# Patient Record
Sex: Female | Born: 1935 | Race: White | Hispanic: No | Smoking: Former smoker
Health system: Southern US, Community
[De-identification: ages and names within clinical notes are randomized; demographics above are authoritative.]

## PROBLEM LIST (undated history)

## (undated) DIAGNOSIS — E538 Deficiency of other specified B group vitamins: Secondary | ICD-10-CM

## (undated) DIAGNOSIS — R21 Rash and other nonspecific skin eruption: Secondary | ICD-10-CM

## (undated) DIAGNOSIS — K851 Biliary acute pancreatitis without necrosis or infection: Secondary | ICD-10-CM

## (undated) DIAGNOSIS — I340 Nonrheumatic mitral (valve) insufficiency: Secondary | ICD-10-CM

## (undated) DIAGNOSIS — D649 Anemia, unspecified: Secondary | ICD-10-CM

## (undated) DIAGNOSIS — IMO0001 Reserved for inherently not codable concepts without codable children: Secondary | ICD-10-CM

## (undated) DIAGNOSIS — N289 Disorder of kidney and ureter, unspecified: Secondary | ICD-10-CM

## (undated) DIAGNOSIS — E669 Obesity, unspecified: Secondary | ICD-10-CM

## (undated) DIAGNOSIS — M1990 Unspecified osteoarthritis, unspecified site: Secondary | ICD-10-CM

## (undated) DIAGNOSIS — Z86010 Personal history of colonic polyps: Secondary | ICD-10-CM

## (undated) DIAGNOSIS — M5104 Intervertebral disc disorders with myelopathy, thoracic region: Secondary | ICD-10-CM

## (undated) DIAGNOSIS — I5032 Chronic diastolic (congestive) heart failure: Secondary | ICD-10-CM

## (undated) DIAGNOSIS — I10 Essential (primary) hypertension: Secondary | ICD-10-CM

## (undated) DIAGNOSIS — J189 Pneumonia, unspecified organism: Secondary | ICD-10-CM

## (undated) DIAGNOSIS — M8580 Other specified disorders of bone density and structure, unspecified site: Secondary | ICD-10-CM

## (undated) DIAGNOSIS — E119 Type 2 diabetes mellitus without complications: Secondary | ICD-10-CM

## (undated) DIAGNOSIS — G629 Polyneuropathy, unspecified: Secondary | ICD-10-CM

## (undated) DIAGNOSIS — E785 Hyperlipidemia, unspecified: Secondary | ICD-10-CM

## (undated) DIAGNOSIS — I350 Nonrheumatic aortic (valve) stenosis: Secondary | ICD-10-CM

## (undated) DIAGNOSIS — J449 Chronic obstructive pulmonary disease, unspecified: Secondary | ICD-10-CM

## (undated) DIAGNOSIS — N184 Chronic kidney disease, stage 4 (severe): Secondary | ICD-10-CM

## (undated) DIAGNOSIS — G709 Myoneural disorder, unspecified: Secondary | ICD-10-CM

## (undated) DIAGNOSIS — K219 Gastro-esophageal reflux disease without esophagitis: Secondary | ICD-10-CM

## (undated) DIAGNOSIS — G4730 Sleep apnea, unspecified: Secondary | ICD-10-CM

## (undated) DIAGNOSIS — F418 Other specified anxiety disorders: Secondary | ICD-10-CM

## (undated) HISTORY — DX: Other specified disorders of bone density and structure, unspecified site: M85.80

## (undated) HISTORY — DX: Essential (primary) hypertension: I10

## (undated) HISTORY — PX: CATARACT EXTRACTION, BILATERAL: SHX1313

## (undated) HISTORY — DX: Hyperlipidemia, unspecified: E78.5

## (undated) HISTORY — DX: Deficiency of other specified B group vitamins: E53.8

## (undated) HISTORY — DX: Myoneural disorder, unspecified: G70.9

## (undated) HISTORY — DX: Chronic kidney disease, stage 4 (severe): N18.4

## (undated) HISTORY — DX: Sleep apnea, unspecified: G47.30

## (undated) HISTORY — DX: Obesity, unspecified: E66.9

## (undated) HISTORY — DX: Chronic obstructive pulmonary disease, unspecified: J44.9

## (undated) HISTORY — DX: Unspecified osteoarthritis, unspecified site: M19.90

## (undated) HISTORY — PX: TUBAL LIGATION: SHX77

## (undated) HISTORY — DX: Anemia, unspecified: D64.9

## (undated) HISTORY — DX: Gastro-esophageal reflux disease without esophagitis: K21.9

---

## 1994-07-31 HISTORY — PX: BREAST SURGERY: SHX581

## 1999-02-08 ENCOUNTER — Other Ambulatory Visit: Admission: RE | Admit: 1999-02-08 | Discharge: 1999-02-08 | Payer: Self-pay | Admitting: Family Medicine

## 1999-11-22 ENCOUNTER — Encounter: Admission: RE | Admit: 1999-11-22 | Discharge: 1999-11-22 | Payer: Self-pay

## 2000-02-08 ENCOUNTER — Ambulatory Visit (HOSPITAL_COMMUNITY): Admission: RE | Admit: 2000-02-08 | Discharge: 2000-02-08 | Payer: Self-pay | Admitting: Neurosurgery

## 2000-02-08 ENCOUNTER — Encounter: Payer: Self-pay | Admitting: Neurosurgery

## 2000-02-23 ENCOUNTER — Encounter: Payer: Self-pay | Admitting: Neurosurgery

## 2000-02-23 ENCOUNTER — Ambulatory Visit (HOSPITAL_COMMUNITY): Admission: RE | Admit: 2000-02-23 | Discharge: 2000-02-23 | Payer: Self-pay | Admitting: Neurosurgery

## 2002-12-09 ENCOUNTER — Ambulatory Visit (HOSPITAL_COMMUNITY): Admission: RE | Admit: 2002-12-09 | Discharge: 2002-12-09 | Payer: Self-pay | Admitting: Neurosurgery

## 2002-12-09 ENCOUNTER — Encounter: Payer: Self-pay | Admitting: Neurosurgery

## 2006-05-09 ENCOUNTER — Ambulatory Visit: Payer: Self-pay | Admitting: Cardiology

## 2007-09-11 LAB — HM DEXA SCAN

## 2008-01-20 ENCOUNTER — Inpatient Hospital Stay (HOSPITAL_COMMUNITY): Admission: EM | Admit: 2008-01-20 | Discharge: 2008-01-23 | Payer: Self-pay | Admitting: Emergency Medicine

## 2008-04-28 ENCOUNTER — Encounter
Admission: RE | Admit: 2008-04-28 | Discharge: 2008-04-28 | Payer: Self-pay | Admitting: Physical Medicine and Rehabilitation

## 2009-06-07 LAB — CONVERTED CEMR LAB: Hemoglobin: 11.2 g/dL

## 2009-06-17 ENCOUNTER — Encounter: Payer: Self-pay | Admitting: Internal Medicine

## 2009-06-17 LAB — CONVERTED CEMR LAB
Creatinine, Ser: 1.1 mg/dL
Hemoglobin: 11 g/dL

## 2009-09-02 ENCOUNTER — Encounter: Payer: Self-pay | Admitting: Internal Medicine

## 2009-09-02 LAB — CONVERTED CEMR LAB
Creatinine, Ser: 1.17 mg/dL
Hemoglobin: 11.5 g/dL

## 2009-09-07 ENCOUNTER — Encounter (INDEPENDENT_AMBULATORY_CARE_PROVIDER_SITE_OTHER): Payer: Self-pay | Admitting: *Deleted

## 2009-10-08 ENCOUNTER — Encounter (INDEPENDENT_AMBULATORY_CARE_PROVIDER_SITE_OTHER): Payer: Self-pay | Admitting: *Deleted

## 2009-10-08 ENCOUNTER — Ambulatory Visit: Payer: Self-pay | Admitting: Internal Medicine

## 2009-10-08 DIAGNOSIS — E538 Deficiency of other specified B group vitamins: Secondary | ICD-10-CM | POA: Insufficient documentation

## 2009-10-11 LAB — CONVERTED CEMR LAB
Basophils Absolute: 0 10*3/uL (ref 0.0–0.1)
Ferritin: 27.6 ng/mL (ref 10.0–291.0)
Folate: >20 ng/mL
Hemoglobin: 10.9 g/dL — ABNORMAL LOW (ref 12.0–15.0)
Iron: 81 ug/dL (ref 42–145)
Lymphocytes Relative: 24.4 % (ref 12.0–46.0)
Monocytes Relative: 7.3 % (ref 3.0–12.0)
Neutro Abs: 4.2 10*3/uL (ref 1.4–7.7)
Neutrophils Relative %: 66.3 % (ref 43.0–77.0)
Platelets: 262 10*3/uL (ref 150.0–400.0)
RDW: 16.2 % — ABNORMAL HIGH (ref 11.5–14.6)
Vitamin B-12: 163 pg/mL — ABNORMAL LOW (ref 211–911)

## 2009-11-15 ENCOUNTER — Telehealth: Payer: Self-pay | Admitting: Internal Medicine

## 2010-05-17 LAB — HEMOGLOBIN A1C: Hgb A1c MFr Bld: 5.7 % (ref 4.0–6.0)

## 2010-05-17 LAB — HM DIABETES FOOT EXAM

## 2010-05-31 NOTE — Letter (Signed)
Summary: New Patient letter  [facility]  [address] [place], [place] [address]   Phone: [phone]  Fax: [phone]       09/07/2009 MRN: [id number]  Anna [name] [address] [place], [place]  [address]  Dear [name],  Welcome to the Gastroenterology Division at [facility].    You are scheduled to see Dr.  Leone Payor on 10-08-09 at 1:45p.m. on the 3rd floor at [facility], [address]  We ask that you try to arrive at our office 15 minutes prior to your appointment time to allow for check-in.  We would like you to complete the enclosed self-administered evaluation form prior to your visit and bring it with you on the day of your appointment.  We will review it with you.  Also, please bring a complete list of all your medications or, if you prefer, bring the medication bottles and we will list them.  Please bring your insurance card so that we may make a copy of it.  If your insurance requires a referral to see a specialist, please bring your referral form from your primary care physician.  Co-payments are due at the time of your visit and may be paid by cash, check or credit card.     Your office visit will consist of a consult with your physician (includes a physical exam), any laboratory testing he/she may order, scheduling of any necessary diagnostic testing (e.g. x-ray, ultrasound, CT-scan), and scheduling of a procedure (e.g. Endoscopy, Colonoscopy) if required.  Please allow enough time on your schedule to allow for any/all of these possibilities.    If you cannot keep your appointment, please call [phone] to cancel or reschedule prior to your appointment date.  This allows [place] the opportunity to schedule an appointment for another patient in need of care.  If you do not cancel or reschedule by 5 p.m. the business day prior to your appointment date, you will be charged a $50.00 late cancellation/no-show fee.    Thank you for  choosing [facility] for your medical needs.  We appreciate the opportunity to care for you.  Please visit [place] at our website  to learn more about our practice.                     Sincerely,                                                             The Gastroenterology Division

## 2010-05-31 NOTE — Assessment & Plan Note (Signed)
Summary: anemia--ch.   History of Present Illness Visit Type: new patient Primary GI MD: Stan Head MD [facility] Requesting Provider: Rudi Heap, MD Chief Complaint: anemia History of Present Illness:   76 yo woman followed by [facility]. She is persistently anemic despite 4 months of iron therapy. Hgb 11 x months. She did not do do stool hemoccults as recommended. Her stools are dark on iron. She also hads heartburn treated with PPI and some bloating. Has not seen any bleeding. Hgb 11 x 1 year Here with daughter and daughter provides some history.   GI Review of Systems    Reports acid reflux and  bloating.      Denies abdominal pain, belching, chest pain, dysphagia with liquids, dysphagia with solids, heartburn, loss of appetite, nausea, vomiting, vomiting blood, weight loss, and  weight gain.        Denies anal fissure, black tarry stools, change in bowel habit, constipation, diarrhea, diverticulosis, fecal incontinence, heme positive stool, hemorrhoids, irritable bowel syndrome, jaundice, light color stool, liver problems, rectal bleeding, and  rectal pain. Anticoagulation Management History:      Positive risk factors for bleeding include an age of 42 years or older.  The bleeding index is 'intermediate risk'.  Positive CHADS2 values include History of HTN.  Negative CHADS2 values include Age > 55 years old.     Preventive Screening-Counseling & Management  Alcohol-Tobacco     Smoking Status: quit  Caffeine-Diet-Exercise     Does Patient Exercise: no      Drug Use:  no.      Current Medications (verified): 1)  Tricor 145 Mg Tabs (Fenofibrate) .... Take 1 Tablet By Mouth Once A Day 2)  Actos 45 Mg Tabs (Pioglitazone Hcl) .... Take 1 Tablet By Mouth Once A Day 3)  Lipitor 80 Mg Tabs (Atorvastatin Calcium) .... Take 1 Tablet By Mouth Once A Day 4)  Aspirin 81 Mg Tbec (Aspirin) .... Take 1 Tablet By Mouth Once A Day 5)  Furosemide 40 Mg Tabs  (Furosemide) .... Take 1 Tablet By Mouth Once A Day 6)  Omeprazole 40 Mg Cpdr (Omeprazole) .... Take 1 Tablet By Mouth Once A Day 7)  Folivane-Plus  Caps (Fefum-Fepoly-Fa-B Cmp-C-Biot) .... Take 1 Tablet By Mouth Once Daily 8)  Gabapentin 300 Mg Caps (Gabapentin) .... Take 1 Tablet By Mouth Three Times A Day 9)  Oxycodone-Acetaminophen 10-325 Mg Tabs (Oxycodone-Acetaminophen) .... Take 1/2 Tablet By Mouth Four Times Daily 10)  Carvedilol 25 Mg Tabs (Carvedilol) .... Take 2 Tablets By Mouth Once Daily 11)  Metformin Hcl 1000 Mg Tabs (Metformin Hcl) .... Take 1 Tablet By Mouth Two Times A Day 12)  Clonidine Hcl 0.2 Mg Tabs (Clonidine Hcl) .... Take 1 Tablet By Mouth Two Times A Day 13)  Benazepril Hcl 40 Mg Tabs (Benazepril Hcl) .... Take 2 Tablets By Mouth Once Daily 14)  Fish Oil 1000 Mg Caps (Omega-3 Fatty Acids) .... Take 2 Tablets By Mouth Once Daily 15)  Vitamin D (Ergocalciferol) 50000 Unit Caps (Ergocalciferol) .... Take 1 Capsule By Mouth Once Per Week 16)  Combivent 18-103 Mcg/act Aero (Ipratropium-Albuterol) .... Take 1 Inhalation Two Times A Day As Needed 17)  Humalog Mix 75/25 75-25 % Susp (Insulin Lispro Prot & Lispro) .... Take 50 Units Once Daily  Allergies (verified): No Known Drug Allergies  Past History:  Past Medical History: Anxiety Disorder Arthritis COPD Depression Diabetes GERD Hyperlipidemia Hypertension Obesity Sleep Apnea  Past Surgical History: Tubal Ligation  Family History:  Family History of Colon Cancer: Sister??? Family History of Diabetes: Sister x 2, Father  Social History: Patient is a former smoker. -stopped 2 years ago Alcohol Use - no Illicit Drug Use - no Patient does not get regular exercise.  Smoking Status:  quit Drug Use:  no Does Patient Exercise:  no  Review of Systems       The patient complains of anxiety-new, arthritis/joint pain, back pain, muscle pains/cramps, sleeping problems, swelling of feet/legs, urination -  excessive, and urine leakage.         right toes painful after trauma this week All other ROS negative except as per HPI.   Vital Signs:  Patient profile:   76 year old female Height:      64 inches Weight:      262.25 pounds BMI:     45.18 BSA:     2.20 Pulse rate:   76 / minute Pulse rhythm:   regular BP sitting:   150 / 58  (left arm) Cuff size:   large  Vitals Entered By: Lamona Curl CMA Duncan Dull) (October 08, 2009 1:28 PM)  Physical Exam  General:  obese, NAD Neck:  Supple; Chest Wall:  kyphosis.   Lungs:  Clear throughout to auscultation. Heart:  Regular rate and rhythm; no murmurs, rubs,  or bruits. Abdomen:  obese, soft and nontender BS+ no HSM/mass Rectal:  anostenosis.   Extremities:  ecchymosis right 5th toe with some tenderness and swollen and tender right 3rd toe Skin:  intertriginous yeast, erythema Psych:  Alert and cooperative. Normal mood and affect.   Impression & Recommendations:  Problem # 1:  ANEMIA, MILD (ICD-285.9)  chronic and in setting of 4 mos iron without improvement, renal insufficiency and other illnesses   Orders: TLB-CBC Platelet - w/Differential (85025-CBCD) TLB-B12 + Folate Pnl ([address]_60454-U98/JXB) TLB-Ferritin (82728-FER) TLB-IBC Pnl (Iron/FE;Transferrin) (83550-IBC)  Problem # 2:  SCREENING, COLON CANCER (ICD-V76.51) Assessment: New  Risks, benefits,and indications of endoscopic procedure(s) were reviewed with the patient and all questions answered.  Orders: Colonoscopy (Colon)  Problem # 3:  VITAMIN B12 DEFICIENCY (ICD-266.2) Assessment: New Found on labs after visit could be contributing to anemia will contact patient and arrange for treatment, likely via PCP  Anticoagulation Management Assessment/Plan:            Patient Instructions: 1)  Please go to the basement to have your lab tests drawn today.  2)  Please pick up your medications at your pharmacy.  3)  We will see you at your procedure on  11/23/09. 4)   [facility] Patient Information Guide given to patient.  5)  Colonoscopy and Flexible Sigmoidoscopy brochure given.  6)  Copy sent to : Paulene Floor, FNP 7)  The medication list was reviewed and reconciled.  All changed / newly prescribed medications were explained.  A complete medication list was provided to the patient / caregiver. Prescriptions: MOVIPREP 100 GM  SOLR (PEG-KCL-NACL-NASULF-NA ASC-C) As per prep instructions.  #1 x 0   Entered by:   Francee Piccolo CMA (AAMA)   Authorized by:   Iva Boop MD, [facility]   Signed by:   Francee Piccolo CMA (AAMA) on 10/08/2009   Method used:   Electronically to        [facility] [phone]* (retail)       [address] [place], [place]  [address]  Ph: [phone] or [phone]       Fax: [phone]   RxID:   [id number]  Patient: Trina Ao Note: All result statuses are Final unless otherwise noted.  Tests: (1) CBC Platelet w/Diff (CBCD)   White Cell Count          6.3 K/uL                    4.5-10.5   Red Cell Count       [L]  3.47 Mil/uL                 3.87-5.11   Hemoglobin           [L]  10.9 g/dL                   72.5-36.6   Hematocrit           [L]  32.1 %                      36.0-46.0   MCV                       92.7 fl                     78.0-100.0   MCHC                      34.0 g/dL                   44.0-34.7   RDW                  [H]  16.2 %                      11.5-14.6   Platelet Count            262.0 K/uL                  150.0-400.0   Neutrophil %              66.3 %                      43.0-77.0   Lymphocyte %              24.4 %                      12.0-46.0   Monocyte %                7.3 %                       3.0-12.0   Eosinophils%              1.6 %                       0.0-5.0   Basophils %               0.4 %                       0.0-3.0   Neutrophill Absolute      4.2 K/uL                    1.4-7.7   Lymphocyte Absolute  1.5  K/uL                    0.7-4.0   Monocyte Absolute         0.5 K/uL                    0.1-1.0  Eosinophils, Absolute                             0.1 K/uL                    0.0-0.7   Basophils Absolute        0.0 K/uL                    0.0-0.1  Tests: (2) B12 + Folate Panel (B12/FOL)   Vitamin B12          [L]  163 pg/mL                   211-911   Folate                    >20.0 ng/mL     Deficient  0.4 - 3.4 ng/mL     Indeterminate  3.4 - 5.4 ng/mL     Normal  >5.4 ng/mL  Tests: (3) Ferritin (FER)   Ferritin                  27.6 ng/mL                  10.0-291.0  Tests: (4) IBC Panel (IBC)   Iron                      81 ug/dL                    47-829   Transferrin               340.2 mg/dL                 562.1-308.6   Iron Saturation      [L]  17.0 %                      20.0-50.0   Impression & Recommendations:  Problem # 1:  ANEMIA, MILD (ICD-285.9)  chronic and in setting of 4 mos iron without improvement, renal insufficiency and other illnesses   Orders: TLB-CBC Platelet - w/Differential (85025-CBCD) TLB-B12 + Folate Pnl ([id number]-M84/XLK) TLB-Ferritin (82728-FER) TLB-IBC Pnl (Iron/FE;Transferrin) (83550-IBC)  Problem # 2:  SCREENING, COLON CANCER (ICD-V76.51) Assessment: New  Risks, benefits,and indications of endoscopic procedure(s) were reviewed with the patient and all questions answered.  Orders: Colonoscopy (Colon)  Problem # 3:  VITAMIN B12 DEFICIENCY (ICD-266.2) Assessment: New Found on labs after visit could be contributing to anemia will contact patient and arrange for treatment, likely via PCP  Anticoagulation Management Assessment/Plan:

## 2010-05-31 NOTE — Letter (Signed)
Summary: Diabetic Instructions  [facility]  [address] [place], [place] [address]   Phone: [phone]  Fax: [phone]    Anna [name] 07/29/1934 MRN: [id number]   _X_   ORAL DIABETIC MEDICATION INSTRUCTIONS  The day before your procedure:   Take your diabetic pill as you do normally  The day of your procedure:   Do not take your diabetic pill    We will check your blood sugar levels during the admission process and again in                   Recovery before discharging you home    _X_   INSULIN (SHORT ACTING) MEDICATION INSTRUCTIONS (Regular, Humulog, Novolog)   The day before your procedure:   Do not take your evening dose   The day of your procedure:   Do not take your morning dose

## 2010-05-31 NOTE — Progress Notes (Signed)
Summary: speak to nurse  Phone Note Call from Patient Call back at [phone]///// [phone]   Caller: Daughter Elease Hashimoto Call For: Leone Payor Reason for Call: Talk to Nurse Summary of Call: Daughter would like to speak to nurse regarding colon that patietn is scheduled for. Initial call taken by: Tawni Levy,  November 15, 2009 3:38 PM  Follow-up for Phone Call        Daughter wants to know what they should do if pt's glucose drops during prep.  Advised daughter to keep check on sugars during day and she may want to have a non diet cola or juice available if sugars begin to drop.  Also explained reason for diet restrictions five days prior.  [name] voices understanding and will call with any other questions/concerns.  Follow-up by: Francee Piccolo CMA Duncan Dull),  November 15, 2009 4:52 PM

## 2010-05-31 NOTE — Letter (Signed)
Summary: [facility]  [facility]   Imported By: Lester Springbrook 10/12/2009 15:28:08  _____________________________________________________________________  External Attachment:    Type:   Image     Comment:   External Document

## 2010-05-31 NOTE — Letter (Signed)
Summary: [facility] Instructions  [facility]  [address] [place], [place] [address]   Phone: [phone]  Fax: [phone]       Anna [name]    01-30-1935    MRN: [id number]      Procedure Day Dorna Bloom: Jake Shark, 11/23/09     Arrival Time: 9:30 AM      Procedure Time: 10:30 AM    Location of Procedure:                    _X_  [facility] (4th Floor)                     PREPARATION FOR COLONOSCOPY WITH MOVIPREP   Starting 5 days prior to your procedure 11/18/09 do not eat nuts, seeds, popcorn, corn, beans, peas,  salads, or any raw vegetables.  Do not take any fiber supplements (e.g. Metamucil, Citrucel, and Benefiber).  THE DAY BEFORE YOUR PROCEDURE         MONDAY, 11/22/09  1.  Drink clear liquids the entire day-NO SOLID FOOD  2.  Do not drink anything colored red or purple.  Avoid juices with pulp.  No orange juice.  3.  Drink at least 64 oz. (8 glasses) of fluid/clear liquids during the day to prevent dehydration and help the prep work efficiently.  CLEAR LIQUIDS INCLUDE: Water Jello Ice Popsicles Tea (sugar ok, no milk/cream) Powdered fruit flavored drinks Coffee (sugar ok, no milk/cream) Gatorade Juice: apple, white grape, white cranberry  Lemonade Clear bullion, consomm, broth Carbonated beverages (any kind) Strained chicken noodle soup Hard Candy                             4.  In the morning, mix first dose of MoviPrep solution:    Empty 1 Pouch A and 1 Pouch B into the disposable container    Add lukewarm drinking water to the top line of the container. Mix to dissolve    Refrigerate (mixed solution should be used within 24 hrs)  5.  Begin drinking the prep at 5:00 p.m. The MoviPrep container is divided by 4 marks.   Every 15 minutes drink the solution down to the next mark (approximately 8 oz) until the full liter is complete.   6.  Follow completed prep with 16 oz of clear liquid of your choice (Nothing red or purple).   Continue to drink clear liquids until bedtime.  7.  Before going to bed, mix second dose of MoviPrep solution:    Empty 1 Pouch A and 1 Pouch B into the disposable container    Add lukewarm drinking water to the top line of the container. Mix to dissolve    Refrigerate  THE DAY OF YOUR PROCEDURE      TUESDAY, 11/23/09  Beginning at 5:30 a.m. (5 hours before procedure):         1. Every 15 minutes, drink the solution down to the next mark (approx 8 oz) until the full liter is complete.  2. Follow completed prep with 16 oz. of clear liquid of your choice.    3. You may drink clear liquids until 8:30 AM (2 HOURS BEFORE PROCEDURE).  MEDICATION INSTRUCTIONS  Unless otherwise instructed, you should take regular prescription medications with a small sip of water   as early as possible the morning of your procedure.  Diabetic patients - see separate instructions.  OTHER INSTRUCTIONS  You will need a responsible adult at least 74 years of age to accompany you and drive you home.   This person must remain in the waiting room during your procedure.  Wear loose fitting clothing that is easily removed.  Leave jewelry and other valuables at home.  However, you may wish to bring a book to read or  an iPod/MP3 player to listen to music as you wait for your procedure to start.  Remove all body piercing jewelry and leave at home.  Total time from sign-in until discharge is approximately 2-3 hours.  You should go home directly after your procedure and rest.  You can resume normal activities the  day after your procedure.  The day of your procedure you should not:   Drive   Make legal decisions   Operate machinery   Drink alcohol   Return to work  You will receive specific instructions about eating, activities and medications before you leave.  The above instructions have been reviewed and explained to me by   _______________________  I fully understand and can  verbalize these instructions _____________________________ Date _________

## 2010-07-16 ENCOUNTER — Encounter: Payer: Self-pay | Admitting: Nurse Practitioner

## 2010-07-16 DIAGNOSIS — E1140 Type 2 diabetes mellitus with diabetic neuropathy, unspecified: Secondary | ICD-10-CM | POA: Insufficient documentation

## 2010-07-16 DIAGNOSIS — E119 Type 2 diabetes mellitus without complications: Secondary | ICD-10-CM

## 2010-07-16 DIAGNOSIS — J449 Chronic obstructive pulmonary disease, unspecified: Secondary | ICD-10-CM

## 2010-07-16 DIAGNOSIS — I10 Essential (primary) hypertension: Secondary | ICD-10-CM | POA: Insufficient documentation

## 2010-07-16 DIAGNOSIS — IMO0001 Reserved for inherently not codable concepts without codable children: Secondary | ICD-10-CM | POA: Insufficient documentation

## 2010-07-16 DIAGNOSIS — G2581 Restless legs syndrome: Secondary | ICD-10-CM

## 2010-07-16 DIAGNOSIS — E785 Hyperlipidemia, unspecified: Secondary | ICD-10-CM | POA: Insufficient documentation

## 2010-07-16 DIAGNOSIS — E669 Obesity, unspecified: Secondary | ICD-10-CM | POA: Insufficient documentation

## 2010-07-16 DIAGNOSIS — M1990 Unspecified osteoarthritis, unspecified site: Secondary | ICD-10-CM | POA: Insufficient documentation

## 2010-09-13 NOTE — Discharge Summary (Signed)
NAMEAZIZA, STUCKERT NO.:  [id number]   MEDICAL RECORD NO.:  [id number]          PATIENT TYPE:  INP   LOCATION:  5017                         FACILITY:  [facility]   PHYSICIAN:  Elliot Cousin, M.D.    DATE OF BIRTH:  1934-05-31   DATE OF ADMISSION:  01/20/2008  DATE OF DISCHARGE:  01/23/2008                               DISCHARGE SUMMARY   DISCHARGE DIAGNOSES:  1. Multilevel advanced disk and facet degeneration in conjunction with      grade 1 retrolisthesis of L5 on S1; severe multifactorial spinal      stenosis at L4-L5 and L3-L4 with pronounced involvement of the left      L4 and L5 nerve roots; focal disk extrusion at L5-S1 involving the      right S1 nerve root; mild-to-moderate multifactorial spinal      stenosis at L1-L2 and L2-L3.  2. Left hip pain and limited ambulation secondary to referred pain      from the lumbar disk disease.  3. Type 2 diabetes mellitus.  4. Hypertension.  5. Chronic obstructive pulmonary disease with history of tobacco use.   DISCHARGE MEDICATIONS:  1. Discontinue Darvocet and Vicodin.  2. OxyContin 10 mg every 12 hours.  3. Oxycodone 5 mg every 4 hours as needed for pain.  4. Insulin 70/30, increase dose to 80 units subcu each evening.  5. Actos 45 mg daily.  6. Metformin 1000 mg b.i.d.  7. Gabapentin 300 mg t.i.d.  8. Coreg 25 mg b.i.d.  9. Benazepril 40 mg b.i.d.  10.Fenofibrate 134 mg daily.  11.Clonidine 0.1 mg b.i.d.  12.Albuterol MDI 2 puffs p.r.n.  13.Ranitidine 300 mg daily.  14.Fish oil 1 g b.i.d.  15.Niaspan 1000 mg nightly.  16.Vitamin D 50,000 units 1 tablet twice weekly.  17.Calcium 600 mg b.i.d.  18.Lasix 80 mg daily.  19.Lipitor 80 mg daily.   DISCHARGE DISPOSITION:  The patient is stable and in improved condition.  She is being discharged home today.  She was advised to follow up with  orthopedic surgeon, Dr. Renae Fickle in 2-3 weeks or as needed and with Dr.  Daphine Deutscher in 1 week.   CONSULTATIONS:  1.  Orthopedic surgeon, Dr. Renae Fickle and colleagues.  2. Interventional radiologist, Dr. Bonnielee Haff.   PROCEDURES PERFORMED:  1. L4-L5 epidural steroid Depo injection by Dr. Bonnielee Haff on January 23, 2008.  2. MRI of the pelvis on January 21, 2008.  The results revealed no      acute or significant pelvic findings.  Lumbar spondylosis with      details dictated below.  3. MRI of the lumbar spine without contrast on January 21, 2008.      The results are above (in the discharge diagnosis).  4. Chest x-ray on January 20, 2008.  The results revealed borderline      cardiomegaly and no active disease.   HISTORY OF PRESENT ILLNESS:  The patient is a 76 year old woman with a  past medical history significant for type 2 diabetes mellitus with  neuropathy, degenerative joint disease, hypertension, and tobacco abuse.  She presented  to the emergency department from Dr. Ollen Gross office with a  chief complaint of left hip pain and difficulty ambulating because of  the pain.  Outpatient therapy with Vicodin did not improve the patient's  symptoms.  The patient has had ongoing hip pain and low back pain for  approximately 1 year.  The patient was admitted for further evaluation  and management.   For additional details, please see the dictated history and physical.   HOSPITAL COURSE:  1. MULTILEVEL DEGENERATIVE JOINT DISEASE AND SEVERE SPINAL STENOSIS OF      THE LUMBAR SPINE WITH NERVE ROOT INVOLVEMENT AND RADICULOPATHY AT      L3-L5.  The patient was started on analgesics with OxyContin 10 mg      every 12 hours and oxycodone 5 mg every 4 hours p.r.n. for moderate-      to-severe pain.  In addition, intravenous Dilaudid was administered      every 4 hours as needed for severe pain.  Dr. Renae Fickle evaluated the      patient and recommended further evaluation with MRI of the lumbar      spine and an MRI of the pelvis.  The MRI of the pelvis and lumbar      spine results are above; however, in essence, the MRI  of the lumbar      spine revealed severe spinal stenosis and multilevel degenerative      joint disease with nerve root involvement particularly at the L4-L5      and L3-L4 levels.  Shortly after the findings of the MRI,      prednisone was started at 40 mg daily.  Dr. Renae Fickle and colleagues      reviewed the results of the MRI and recommended an epidural steroid      injection at the L4-L5 level.  Therefore, interventional      radiologist, Dr. Bonnielee Haff was consulted.  He performed the epidural      steroid injection approximately 1 hour ago.  The patient tolerated      the procedure well.  She is currently in some pain; however, it is      significantly less compared to the pain she had when she was first      admitted.   The patient was evaluated by the therapists during the hospitalization.  During the physical therapist evaluation, the patient was actually able  to ambulate 100 feet with a rolling walker without too much pain. A  rolling walker, three-in-one commode and a tub seat were ordered for the  patient.  Both therapists recommended ongoing therapy at home.  Home  Health Services were ordered prior to hospital discharge.   Currently, the patient is in no acute distress.  She is ambulating with  the rolling walker.  She will be maintained on OxyContin and oxycodone  p.r.n.  The oral prednisone was discontinued upon discharge.  The  patient will follow up with Dr. Renae Fickle as needed.  1. HYPERTENSION.  The patient was restarted on her chronic      antihypertensive medications including Coreg, benazepril, and      clonidine.  The dose of Lasix was decreased to 20 mg daily and the      patient was gently volume repleted.  However, after the patient's      blood pressure began to increase, the IV fluids were discontinued.      The patient was advised to restart Lasix at the previous dosing  upon discharge.  2. TYPE 2 DIABETES MELLITUS.  The patient was maintained on 70/30      insulin  as well as metformin and Actos.  Sliding scale NovoLog was      added q.a.c. and nightly.  Because of the steroid treatment, the      patient's capillary blood glucose increased to over 300.  Upon      discharge, the patient was advised to increase the 70/30 insulin to      80 units subcu each evening.  She was strongly advised to stay in      contact with her primary care      physician for further adjustments in her insulin and/or oral      hypoglycemic agents given that she received a Depo steroid      injection prior to hospital discharge.  The patient's hemoglobin      A1c was 6.2; and therefore, she appears to have had good outpatient      control of her diabetes mellitus.      Elliot Cousin, M.D.  Electronically Signed     DF/MEDQ  D:  01/23/2008  T:  01/24/2008  Job:  [id number]   cc:   Deidre Ala, M.D.  [facility] Dr. Paulene Floor

## 2010-09-13 NOTE — H&P (Signed)
NAMEOVIDA, Anna [name] NO.:  [id number]   MEDICAL RECORD NO.:  [id number]          PATIENT TYPE:  EMS   LOCATION:  MAJO                         FACILITY:  [facility]   PHYSICIAN:  Isidor Holts, M.D.  DATE OF BIRTH:  03/23/1935   DATE OF ADMISSION:  01/20/2008  DATE OF DISCHARGE:                              HISTORY & PHYSICAL   PRIMARY MEDICAL DOCTOR:  Dr. Paulene Floor, [facility].   CHIEF COMPLAINT:  Left hip pain, difficulty in ambulation for the past 3  days.   HISTORY OF PRESENT ILLNESS:  This is a 76 year old female.  She provided  a good history, and history is also supplied by the patient's family,  who were present in the emergency department.  Reportedly, the patient  was referred to the emergency department by orthopedic surgeon, Dr.  Renae Fickle, after been seen in his office on January 20, 2008.  The patient  has been troubled by left hip pain for approximately 1 year; however,  over the past 3 weeks, this has become much worse.  She has now been  unable to ambulate for the last 3 days.  She denies antecedent trauma.  She was seen by her primary MD on January 17, 2008, prescribed  Vicodin, and had left lower extremity venous Doppler done, which was  negative for DVT, but confirmed left popliteal cyst.  She was then  referred to Dr. Renae Fickle, orthopedic surgeon, who saw her in his office  today and referred her to the emergency department for medical admission  and workup for failure to thrive/multiple medical issues.   PAST MEDICAL HISTORY:  1. Type 2 diabetes mellitus with neuropathy.  2. Hypertension.  3. Dyslipidemia.  4. COPD.  5. Osteoarthritis.  6. Restless leg syndrome.  7. Fibrocystic disease, status post needle biopsy April 1996.  8. Bilateral frozen shoulders.  9. Morbid obesity.  10.Back pain, status post epidural injection February 2001, x2.  11.Smoking history.   MEDICATION HISTORY:  1. Coreg 25 mg p.o. b.i.d.  2.  Gabapentin 300 mg p.o. t.i.d.  3. Aspirin 81 mg p.o. daily.  4. Benazepril 40 mg p.o. b.i.d.  5. Actos 45 mg p.o. daily.  6. Fenofibrate 134 mg p.o. daily.  7. Metformin 1 gm p.o. b.i.d.  8. Clonidine 0.1 mg p.o. b.i.d.  9. Albuterol MDI 2 puffs p.r.n.  10.NovoLog mix (70/30) 70 units subcutaneously q. 5:00 p.m.  11.Ranitidine 300 mg p.o. daily.  12.Fish Oil 1 gm p.o. b.i.d.  13.Niaspan 1000 mg p.o. nightly.  14.Vitamin D (query dosage) x2 per week.  15.Calcium 600 mg p.o. b.i.d.  16.Vicodin (5/500) 1 p.o. p.r.n. q.6 h., since January 17, 2008.  17.Lasix 80 mg p.o. daily.  18.Lipitor 80 mg p.o. daily.  19.Darvocet-N 100 1/2 tablet p.o. q.i.d.   ALLERGIES:  NO KNOWN DRUG ALLERGIES.   REVIEW OF SYSTEMS:  As per HPI and chief complaint, otherwise negative.  Denies chest pain.  Denies shortness of breath, abdominal pain, vomiting  or diarrhea.  Moves bowels regularly, although no bowel movement since  January 17, 2008.   SOCIAL HISTORY:  The patient is retired and widowed.  She has eight  offspring, four males, four females.  Smokes 1-1/2 pack per day, has  done so for over 30 years.  Nondrinker.  She has no history of drug  abuse.   FAMILY HISTORY:  Father is deceased from COPD.  He had heart problems at  age 57 years.  Mother is also deceased at age 3 years for unclear  reasons.  Family history is otherwise noncontributory.   PHYSICAL EXAMINATION:  VITAL SIGNS:  Still pending at the time of this  evaluation.  GENERAL:  The patient is alert, communicative, not short of breath at  rest.  Does not appear to be in obvious acute distress.  HEENT:  No clinical pallor or jaundice.  No conjunctival injection.  Throat is clear.  NECK:  Supple.  JVP not seen.  No palpable lymphadenopathy.  No palpable  goiter.  CHEST:  Clinically clear to auscultation.  No wheezes or crackles.  HEART:  Sounds S1-S2 heard, normal, regular, no murmurs.  ABDOMEN:  Morbidly obese, soft, nontender.   No palpable organomegaly.  No palpable masses.  Normal bowel sounds.  LOWER EXTREMITIES:  No pitting edema.  Palpable peripheral pulses.  MUSCULOSKELETAL SYSTEM:  Generalized osteoarthritic changes are noted.  The patient appears to have full range of motion of both hips, as well  as knees.  Experiences some discomfort on lateral rotation of hip  joints.  Bilateral straight leg raising tests are negative and there is  no tenderness to palpation of the lumbosacral spine.  CENTRAL NERVOUS SYSTEM:  No focal neurologic deficit on gross  examination.   INVESTIGATIONS:  CBC - WBC 6.2, hemoglobin 13.3, hematocrit 39.5,  platelets 254.  Electrolytes; sodium 140, potassium 4.1, chloride 108,  CO2 of 24, BUN 19, creatinine 1.04, glucose 111.  CBG 107.  Chest x-ray  dated January 20, 2008, showed borderline cardiomegaly but no active  disease.   ASSESSMENT AND PLAN:  1. Left hip/left lower extremity pain.  Given the chronicity of      symptoms, and recent exacerbation, likely differential diagnostic      considerations include degenerative joint disease/osteoarthritis of      the lumbar spine with radiculopathy and left hip osteoarthritis      plus/minus occult fracture.  The patient has already been seen by      Dr. Renae Fickle, orthopedic surgeon, who will follow, during this      hospitalization.  He has recommended lumbosacral MRI, left hip and      pelvic MRI.  We shall involve physical therapy/occupational therapy      and manage the patient with analgesics.   1. Type 2 diabetes mellitus.  This appears controlled.  We shall      continue pre-admission oral hypoglycemic medications.  Continue      insulin, however, reduce dosage to 50 units subcutaneously q.p.m.      as the patient is likely to be on a strict carbohydrate modified      diet while hospitalized.  We shall follow serial CBGs,adjust      medications as indicated, and for completeness do HbA1c.   1. Dyslipidemia.  We shall do TSH  and lipid profile.  Meanwhile,      continue pre-admission medications.   1. Hypertension.  The patient is on multiple antihypertensive      medications.  We shall continue these, and monitor blood pressure.   1. Smoking history.  The patient has been counseled appropriately.  We      shall utilize Nicoderm CQ patch.   1. Chronic obstructive pulmonary disease.  This appears stable.  We      shall utilize p.r.n. bronchodilator treatment.   Further management will depend on clinical course.      Isidor Holts, M.D.  Electronically Signed     CO/MEDQ  D:  01/20/2008  T:  01/20/2008  Job:  [id number]   cc:   Paulene Floor, MD

## 2010-09-16 NOTE — Assessment & Plan Note (Signed)
[facility]                            CARDIOLOGY OFFICE NOTE   NAME:Anna [name], Anna [name]                     MRN:          [id number]  DATE:05/09/2006                            DOB:          1934-06-30    Primary:  Dr. Montey Hora.   REASON FOR PRESENTATION:  Evaluate patient with dyspnea and multiple  cardiovascular risk factors.   HISTORY OF PRESENT ILLNESS:  The patient is a pleasant 76 year old  without prior cardiac history, but with multiple cardiovascular risk  factors.  She does present with symptoms of dyspnea.  This has been  slowly progressive.  This happens with minimal exertion.  She is very  limited in her activities because of leg and back pain.  When she tried  to do things such as vacuum, she has to vacuum for a few minutes and  stop.  This has been, again, slowly progressive.  She does not describe  any acute change, and has had no PND or orthopnea.  She does not have  any palpitations, presyncope or syncope.  She does not describe any  chest pressure, neck or arm discomfort.  She has no activity-induced  nausea, no vomiting or diaphoresis.   Of note, she does snore, and appears to have some difficulty controlling  hypertension.   PAST MEDICAL HISTORY:  1. Hypertension x20 years.  2. Hyperlipidemia x20 years.  3. Diabetes mellitus x20 years.   PAST SURGICAL HISTORY:  None.   ALLERGIES:  NONE.   MEDICATIONS:  1. Lorazepam.  2. Ranitidine.  3. Benazepril 40 mg b.i.d.  4. Clonidine 0.1 mg b.i.d.  5. Lipitor 80 mg nightly.  6. Actos 45 mg daily.  7. Tricor 145 mg daily.  8. Metformin 1000 mg b.i.d.  9. Carvedilol 25 mg b.i.d.  10.Gabapentin 300 mg t.i.d.  11.NovoLog.  12.Combivent.   SOCIAL HISTORY:  Patient is retired.  She is a widow.  She had 9  children, 8 living.  She smokes 2 packs per day, and has done so for 30  years.   FAMILY HISTORY:  Noncontributory for early coronary artery disease.  She  has 1  sister who takes heart medicines.   REVIEW OF SYSTEMS:  Positive for headaches, reflux, joint and back  pains.  Negative for other systems.   PHYSICAL EXAMINATION:  Patient is in no distress.  Weight 257 pounds,  BMI 45.  Blood pressure 192/82, heart rate 69 and regular.  HEENT:  Eyes unremarkable.  Pupils are equal, round, and reactive to  light and accommodation.  Fundi within normal limits.  Oral mucosa  unremarkable.  NECK:  Very difficult examination because of the size, no bruits, I  cannot appreciate the jugular wave form, I do not appreciate thyromegaly  or lymphadenopathy.  LYMPHATICS:  Again, difficult exam, but no obvious cervical, axillary,  inguinal adenopathy.  BACK:  Buffalo hump.  No costovertebral angle tenderness.  LUNGS:  Clear to auscultation bilaterally without wheezing or dullness  to percussion.  HEART:  Distant heart sounds, S1 and S2 within normal limits.  No S3, no  S4, no murmur.  ABDOMEN:  Morbidly obese, positive bowel sounds, normal in frequency and  pitch.  No bruits, no rebound, no guarding, no midline pulsatile mass,  no hepatomegaly, splenomegaly.  SKIN:  No rashes, no nodules.  EXTREMITIES:  2+ pulses throughout, mild bilateral lower extremity edema  and nonpitting edema.  No cyanosis or no clubbing.  NEURO:  Oriented to person place and time.  Cranial nerves II-XII  grossly intact.  Motor grossly intact.   EKG:  Sinus rhythm, rate 68, axis within normal limits, intervals within  normal limits.  Poor anterior R-wave progression.  No acute ST-T wave  changes.   ASSESSMENT/PLAN:  1. Dyspnea.  The patient has dyspnea and a slightly abnormal EKG with      a poor anterior R-wave progression.  She clearly has significant      cardiovascular risk factors.  The pretest probability of      obstructive coronary disease is at least moderate.  She would not      be able to walk on a treadmill, therefore, we will screen her with      a stress perfusion  study.  2. Hypertension.  Blood pressure is difficult to control, but this      initial reading in our office was unusual.  On repeat her systolic      was 160 with a diastolic of 68.  Of note, blood pressure is often      above 140.  She could have her clonidine increased, but I will      defer to Dr. Dimple Casey.  Weight loss may also help, and certainly needs      to be accomplished.  Also, I will measure a cortisol level given      her cushingoid appearance.  3. Snoring.  I would strongly suspect the patient of having sleep      apnea.  We discussed this at length, however, she refuses a sleep      study.  This could explain her difficult to control hypertension as      well as her headaches.  4. She understands that she is morbidly obese.  She would need to diet      since she cannot walk very well.  5. Tobacco.  She needs to quit smoking but she says she cannot.  We      discussed Chantix.  A family member had a bad reaction to this, and      she will not take it.  6. Followup.  I will see her back based on the results of the above.      She needs to continue to follow with Dr. Dimple Casey for aggressive risk      factor modification.     Rollene Rotunda, MD, [facility]  Electronically Signed    JH/MedQ  DD: 05/09/2006  DT: 05/09/2006  Job #: [id number]

## 2010-10-25 ENCOUNTER — Other Ambulatory Visit: Payer: Self-pay | Admitting: Orthopedic Surgery

## 2010-10-25 DIAGNOSIS — M25511 Pain in right shoulder: Secondary | ICD-10-CM

## 2010-10-29 ENCOUNTER — Ambulatory Visit
Admission: RE | Admit: 2010-10-29 | Discharge: 2010-10-29 | Disposition: A | Discharge status: Home / Self Care | Payer: Self-pay | Source: Ambulatory Visit | Attending: Orthopedic Surgery | Admitting: Orthopedic Surgery

## 2010-10-29 DIAGNOSIS — M25511 Pain in right shoulder: Secondary | ICD-10-CM

## 2011-01-30 LAB — GLUCOSE, CAPILLARY
Glucose-Capillary: 106 — ABNORMAL HIGH
Glucose-Capillary: 178 — ABNORMAL HIGH
Glucose-Capillary: 178 — ABNORMAL HIGH
Glucose-Capillary: 207 — ABNORMAL HIGH
Glucose-Capillary: 247 — ABNORMAL HIGH
Glucose-Capillary: 315 — ABNORMAL HIGH

## 2011-01-30 LAB — CBC
MCHC: 33.7
Platelets: 254
RBC: 4.11
WBC: 6.2

## 2011-01-30 LAB — DIFFERENTIAL
Basophils Relative: 1
Eosinophils Absolute: 0.1
Lymphs Abs: 1.4
Monocytes Relative: 8
Neutro Abs: 4.1
Neutrophils Relative %: 67

## 2011-01-30 LAB — BASIC METABOLIC PANEL
BUN: 19
CO2: 23
Calcium: 9.1
Calcium: 9.3
Creatinine, Ser: 0.97
Creatinine, Ser: 1.04
GFR calc Af Amer: >60
GFR calc Af Amer: >60
Sodium: 140

## 2011-01-30 LAB — TSH: TSH: 1.331

## 2011-01-30 LAB — LIPID PANEL
Cholesterol: 141
LDL Cholesterol: 44
Triglycerides: 309 — ABNORMAL HIGH

## 2011-01-30 LAB — HEMOGLOBIN A1C
Hgb A1c MFr Bld: 6.2 — ABNORMAL HIGH
Mean Plasma Glucose: 131

## 2012-07-16 ENCOUNTER — Ambulatory Visit: Payer: Self-pay | Admitting: General Practice

## 2012-07-22 ENCOUNTER — Telehealth: Payer: Self-pay | Admitting: Nurse Practitioner

## 2012-07-22 NOTE — Telephone Encounter (Signed)
APPT MADE

## 2012-07-22 NOTE — Telephone Encounter (Signed)
wtbs thurs or fri with mmm around 11 o'clock

## 2012-08-09 ENCOUNTER — Ambulatory Visit (INDEPENDENT_AMBULATORY_CARE_PROVIDER_SITE_OTHER): Payer: Medicare Other | Admitting: Nurse Practitioner

## 2012-08-09 ENCOUNTER — Encounter: Payer: Self-pay | Admitting: Nurse Practitioner

## 2012-08-09 VITALS — BP 110/57 | HR 65 | Temp 98.5°F | Ht 59.5 in | Wt 250.0 lb

## 2012-08-09 DIAGNOSIS — I10 Essential (primary) hypertension: Secondary | ICD-10-CM

## 2012-08-09 DIAGNOSIS — E119 Type 2 diabetes mellitus without complications: Secondary | ICD-10-CM

## 2012-08-09 DIAGNOSIS — G894 Chronic pain syndrome: Secondary | ICD-10-CM

## 2012-08-09 DIAGNOSIS — E785 Hyperlipidemia, unspecified: Secondary | ICD-10-CM

## 2012-08-09 DIAGNOSIS — M65331 Trigger finger, right middle finger: Secondary | ICD-10-CM

## 2012-08-09 DIAGNOSIS — M6530 Trigger finger, unspecified finger: Secondary | ICD-10-CM

## 2012-08-09 LAB — COMPREHENSIVE METABOLIC PANEL
ALT: 22 U/L (ref 0–35)
AST: 25 U/L (ref 0–37)
Albumin: 4 g/dL (ref 3.5–5.2)
BUN: 60 mg/dL — ABNORMAL HIGH (ref 6–23)
CO2: 27 mEq/L (ref 19–32)
Calcium: 9.7 mg/dL (ref 8.4–10.5)
Chloride: 104 mEq/L (ref 96–112)
Creat: 2.2 mg/dL — ABNORMAL HIGH (ref 0.50–1.10)
Potassium: 5.5 mEq/L — ABNORMAL HIGH (ref 3.5–5.3)

## 2012-08-09 LAB — POCT GLYCOSYLATED HEMOGLOBIN (HGB A1C): Hemoglobin A1C: 6.8

## 2012-08-09 MED ORDER — OXYCODONE-ACETAMINOPHEN 10-325 MG PO TABS: 1.0000 | ORAL_TABLET | Freq: Two times a day (BID) | ORAL | Status: DC

## 2012-08-09 NOTE — Progress Notes (Signed)
  Subjective:    Patient ID: Anna [name], female    DOB: 25-Jul-1934, 78 y.o.   MRN: [id number]  HPI Pt is here for follow up on chronic medical problems. Pt states she is doing well except for R. Middle trigger finger inflammation. Pt is checking her BS daily and are running between 120-130 fasting. Has numbness in both feet. Pt is taking all medications as Rx.    Review of Systems  Constitutional: Positive for fatigue.  Respiratory: Positive for shortness of breath (only with exertion).   Cardiovascular: Negative for chest pain.  Gastrointestinal: Negative for nausea, vomiting, abdominal pain, diarrhea and constipation.  Genitourinary: Positive for frequency (chronic). Negative for dysuria, hematuria, flank pain and pelvic pain.  Neurological: Positive for numbness (feet bil) and headaches (intermittent). Negative for dizziness.  Psychiatric/Behavioral: Positive for sleep disturbance (intermittent). The patient is nervous/anxious.        Objective:   Physical Exam  Constitutional: She is oriented to person, place, and time. She appears well-developed and well-nourished.  HENT:  Nose: Nose normal.  Mouth/Throat: Oropharynx is clear and moist.  Eyes: EOM are normal.  Neck: Trachea normal, normal range of motion and full passive range of motion without pain. Neck supple. No JVD present. Carotid bruit is not present. No thyromegaly present.  Cardiovascular: Normal rate, regular rhythm, normal heart sounds and intact distal pulses.  Exam reveals no gallop and no friction rub.   No murmur heard. Pulmonary/Chest: Effort normal and breath sounds normal.  Abdominal: Soft. Bowel sounds are normal. She exhibits no distension and no mass. There is no tenderness.  Musculoskeletal: Normal range of motion.  R. Hand inflammation - Middle Trigger Finger  Lymphadenopathy:    She has no cervical adenopathy.  Neurological: She is alert and oriented to person, place, and time. She has normal  reflexes.  +4/4 monofilament bil Pedal pulse 2+ 2+ pedal edema  Skin: Skin is warm and dry.  Psychiatric: She has a normal mood and affect. Her behavior is normal. Judgment and thought content normal.    BP 110/57  Pulse 65  Temp(Src) 98.5 F (36.9 C) (Oral)  Ht 4' 11.5" (1.511 m)  Wt 250 lb (113.399 kg)  BMI 49.67 kg/m2  Results for orders placed in visit on 08/09/12  POCT GLYCOSYLATED HEMOGLOBIN (HGB A1C)      Result Value Range   Hemoglobin A1C 6.8           Assessment & Plan:  1. Diabetes Diet - POCT glycosylated hemoglobin (Hb A1C) - Comprehensive metabolic panel - NMR, lipoprofile  2. HTN (hypertension) Low Na+ diet - Comprehensive metabolic panel - NMR, lipoprofile  3. Other and unspecified hyperlipidemia Low Fat Diet - Comprehensive metabolic panel - NMR, lipoprofile  4. Chronic pain syndrome Deep Breathing Percocet 10-325 mg PO PRN   Mary-Margaret Daphine Deutscher, FNP

## 2012-08-09 NOTE — Patient Instructions (Addendum)

## 2012-08-12 LAB — NMR, LIPOPROFILE

## 2012-08-13 ENCOUNTER — Other Ambulatory Visit: Payer: Self-pay

## 2012-08-13 MED ORDER — PREGABALIN 50 MG PO CAPS: 50.0000 mg | ORAL_CAPSULE | Freq: Three times a day (TID) | ORAL | Status: DC

## 2012-08-13 MED ORDER — BUDESONIDE-FORMOTEROL FUMARATE 80-4.5 MCG/ACT IN AERO: INHALATION_SPRAY | RESPIRATORY_TRACT | Status: DC

## 2012-08-13 NOTE — Telephone Encounter (Signed)
Last seen 04/01/12   Print RX's and have nurse call patient to pick up for mail order

## 2012-08-14 ENCOUNTER — Encounter: Payer: Self-pay | Admitting: Family Medicine

## 2012-08-23 ENCOUNTER — Telehealth: Payer: Self-pay | Admitting: Nurse Practitioner

## 2012-08-26 ENCOUNTER — Telehealth: Payer: Self-pay | Admitting: Nurse Practitioner

## 2012-08-26 ENCOUNTER — Other Ambulatory Visit (INDEPENDENT_AMBULATORY_CARE_PROVIDER_SITE_OTHER): Payer: Medicare Other

## 2012-08-26 DIAGNOSIS — R7989 Other specified abnormal findings of blood chemistry: Secondary | ICD-10-CM

## 2012-08-26 LAB — BASIC METABOLIC PANEL
BUN: 52 mg/dL — ABNORMAL HIGH (ref 6–23)
Chloride: 106 mEq/L (ref 96–112)
Creat: 2.06 mg/dL — ABNORMAL HIGH (ref 0.50–1.10)

## 2012-08-26 NOTE — Progress Notes (Signed)
Patient came in for potassium re check per [facility]

## 2012-08-27 NOTE — Telephone Encounter (Signed)
Pt wanted 30 day supply- of rx written on lyrica from 08/13/12. permission given.

## 2012-09-13 NOTE — Telephone Encounter (Signed)
error 

## 2012-10-02 ENCOUNTER — Other Ambulatory Visit: Payer: Self-pay | Admitting: Nurse Practitioner

## 2012-10-02 DIAGNOSIS — G894 Chronic pain syndrome: Secondary | ICD-10-CM

## 2012-10-03 MED ORDER — OXYCODONE-ACETAMINOPHEN 10-325 MG PO TABS: 1.0000 | ORAL_TABLET | Freq: Two times a day (BID) | ORAL | Status: DC

## 2012-10-03 NOTE — Telephone Encounter (Signed)
rx ready for pickup 

## 2012-10-03 NOTE — Telephone Encounter (Signed)
RX UP FRONT- PT AWARE

## 2012-10-03 NOTE — Telephone Encounter (Signed)
Last seen 07/30/12, last filled 08/09/12. Call pt [phone] to pick up

## 2012-11-04 ENCOUNTER — Telehealth: Payer: Self-pay | Admitting: Nurse Practitioner

## 2012-11-04 NOTE — Telephone Encounter (Signed)
For a few weeks she has been dizzy in the mornings and feels like she might pass out. Sometimes her hand will go numb. Her family thinks that her BP is bottoming out. They took it this morning and it was 150 over something per pt. Her BS was 130 this am. Please advise

## 2012-11-04 NOTE — Telephone Encounter (Signed)
150 systolic is actually high not low- NTBS

## 2012-11-05 ENCOUNTER — Ambulatory Visit (INDEPENDENT_AMBULATORY_CARE_PROVIDER_SITE_OTHER): Payer: Medicare Other | Admitting: Nurse Practitioner

## 2012-11-05 ENCOUNTER — Encounter: Payer: Self-pay | Admitting: Nurse Practitioner

## 2012-11-05 VITALS — BP 110/70 | HR 69 | Temp 97.7°F | Ht 59.0 in | Wt 252.0 lb

## 2012-11-05 DIAGNOSIS — I951 Orthostatic hypotension: Secondary | ICD-10-CM

## 2012-11-05 NOTE — Patient Instructions (Addendum)
Dizziness Dizziness is a common problem. It is a feeling of unsteadiness or lightheadedness. You may feel like you are about to faint. Dizziness can lead to injury if you stumble or fall. A person of any age group can suffer from dizziness, but dizziness is more common in older adults. CAUSES  Dizziness can be caused by many different things, including:  Middle ear problems.  Standing for too long.  Infections.  An allergic reaction.  Aging.  An emotional response to something, such as the sight of blood.  Side effects of medicines.  Fatigue.  Problems with circulation or blood pressure.  Excess use of alcohol, medicines, or illegal drug use.  Breathing too fast (hyperventilation).  An arrhythmia or problems with your heart rhythm.  Low red blood cell count (anemia).  Pregnancy.  Vomiting, diarrhea, fever, or other illnesses that cause dehydration.  Diseases or conditions such as Parkinson's disease, high blood pressure (hypertension), diabetes, and thyroid problems.  Exposure to extreme heat. DIAGNOSIS  To find the cause of your dizziness, your caregiver may do a physical exam, lab tests, radiologic imaging scans, or an electrocardiography test (ECG).  TREATMENT  Treatment of dizziness depends on the cause of your symptoms and can vary greatly. HOME CARE INSTRUCTIONS   Drink enough fluids to keep your urine clear or pale yellow. This is especially important in very hot weather. In the elderly, it is also important in cold weather.  If your dizziness is caused by medicines, take them exactly as directed. When taking blood pressure medicines, it is especially important to get up slowly.  Rise slowly from chairs and steady yourself until you feel okay.  In the morning, first sit up on the side of the bed. When this seems okay, stand slowly while holding onto something until you know your balance is fine.  If you need to stand in one place for a long time, be sure to  move your legs often. Tighten and relax the muscles in your legs while standing.  If dizziness continues to be a problem, have someone stay with you for a day or two. Do this until you feel you are well enough to stay alone. Have the person call your caregiver if he or she notices changes in you that are concerning.  Do not drive or use heavy machinery if you feel dizzy.  Do not drink alcohol. SEEK IMMEDIATE MEDICAL CARE IF:   Your dizziness or lightheadedness gets worse.  You feel nauseous or vomit.  You develop problems with talking, walking, weakness, or using your arms, hands, or legs.  You are not thinking clearly or you have difficulty forming sentences. It may take a friend or family member to determine if your thinking is normal.  You develop chest pain, abdominal pain, shortness of breath, or sweating.  Your vision changes.  You notice any bleeding.  You have side effects from medicine that seems to be getting worse rather than better. MAKE SURE YOU:   Understand these instructions.  Will watch your condition.  Will get help right away if you are not doing well or get worse. Document Released: 10/11/2000 Document Revised: 07/10/2011 Document Reviewed: 11/04/2010 [facility] Patient Information 2014 [place], [place]. Orthostatic Hypotension Orthostatic hypotension is a sudden fall in blood pressure. It occurs when a person goes from a sitting or lying position to a standing position. CAUSES  Loss of body fluids (dehydration). Medicines that lower blood pressure. Sudden changes in posture, such as sudden standing when you have  been sitting or lying down. Taking too much of your medicine. SYMPTOMS  Lightheadedness or dizziness. Fainting or near-fainting. A fast heart rate (tachycardia). Weakness. Feeling tired (fatigue). DIAGNOSIS  Your caregiver may find the cause of orthostatic hypotension through: A history and/or physical exam. Checking your blood pressure. Your  caregiver will check your blood pressure when you are: Lying down. Sitting. Standing. Tilt table testing. In this test, you are placed on a table that goes from a lying position to a standing position. You will be strapped to the table. This test helps to monitor your blood pressure and heart rate when you are in different positions. TREATMENT  If orthostatic hypotension is caused by your medicines, your caregiver will need to adjust your dosage. Do not stop or adjust your medicine on your own. When changing positions, make these changes slowly. This allows your body to adjust to the different position. Compression stockings that are worn on your lower legs may be helpful. Your caregiver may have you consume extra salt. Do not add extra salt to your diet unless directed by your caregiver. Eat frequent, small meals. Avoid sudden standing after eating. Avoid hot showers or excessive heat. Your caregiver may give you fluids through the vein (intravenous). Your caregiver may put you on medicine to help enhance fluid retention. SEEK IMMEDIATE MEDICAL CARE IF:  You faint or have a near-fainting episode. Call your local emergency services (911 in [place] You have or develop chest pain. You feel sick to your stomach (nauseous) or vomit. You have a loss of feeling or movement in your arms or legs. You have difficulty talking, slurred speech, or you are unable to talk. You have difficulty thinking or have confused thinking. MAKE SURE YOU:  Understand these instructions. Will watch your condition. Will get help right away if you are not doing well or get worse. Document Released: 04/07/2002 Document Revised: 07/10/2011 Document Reviewed: 07/31/2008 [facility] Patient Information 2014 [place], [place].

## 2012-11-05 NOTE — Progress Notes (Signed)
  Subjective:    Patient ID: Anna [name], female    DOB: Sep 13, 1934, 78 y.o.   MRN: [id number]  HPI Pt states yesterday AM she felt "weak and dizzy". Her grandson came over and checked her BP and he said it was "low". Pt states she feels better today, still feels weak. Laying down makes it better.    Review of Systems  Neurological: Positive for dizziness and light-headedness.       Objective:   Physical Exam  Constitutional: She is oriented to person, place, and time. She appears well-developed and well-nourished.  HENT:  Head: Normocephalic.  Right Ear: External ear normal.  Left Ear: External ear normal.  Eyes: Pupils are equal, round, and reactive to light.  Neck: Normal range of motion. Neck supple.  Cardiovascular: Normal rate, regular rhythm, normal heart sounds and intact distal pulses.   Pulmonary/Chest: Effort normal and breath sounds normal.  Abdominal: Soft. Bowel sounds are normal.  Musculoskeletal: Normal range of motion. She exhibits edema (trace amt edema BLE).  Neurological: She is alert and oriented to person, place, and time. She has normal reflexes.  Skin: Skin is warm and dry.  Psychiatric: She has a normal mood and affect. Her behavior is normal. Judgment and thought content normal.     BP 110/52  Pulse 69  Temp(Src) 97.7 F (36.5 C) (Oral)  Ht 4\' 11"  (1.499 m)  Wt 252 lb (114.306 kg)  BMI 50.87 kg/m2      Assessment & Plan:  1. Orthostatic hypotension Drink liquids Rise slowly and get her balance before walking If continues will send to neurologist  Mary-Margaret Daphine Deutscher, FNP

## 2012-11-05 NOTE — Telephone Encounter (Signed)
Patient aware sent to triage to make appointment  

## 2012-11-10 LAB — HM DIABETES EYE EXAM

## 2012-11-13 ENCOUNTER — Ambulatory Visit (INDEPENDENT_AMBULATORY_CARE_PROVIDER_SITE_OTHER): Payer: Medicare Other | Admitting: Nurse Practitioner

## 2012-11-13 ENCOUNTER — Encounter: Payer: Self-pay | Admitting: Nurse Practitioner

## 2012-11-13 VITALS — BP 121/60 | HR 60 | Temp 97.3°F | Ht 62.0 in | Wt 250.0 lb

## 2012-11-13 DIAGNOSIS — E119 Type 2 diabetes mellitus without complications: Secondary | ICD-10-CM

## 2012-11-13 DIAGNOSIS — I10 Essential (primary) hypertension: Secondary | ICD-10-CM

## 2012-11-13 DIAGNOSIS — G894 Chronic pain syndrome: Secondary | ICD-10-CM

## 2012-11-13 DIAGNOSIS — I951 Orthostatic hypotension: Secondary | ICD-10-CM

## 2012-11-13 DIAGNOSIS — Z794 Long term (current) use of insulin: Secondary | ICD-10-CM

## 2012-11-13 DIAGNOSIS — E785 Hyperlipidemia, unspecified: Secondary | ICD-10-CM

## 2012-11-13 LAB — POCT GLYCOSYLATED HEMOGLOBIN (HGB A1C): Hemoglobin A1C: 6.8

## 2012-11-13 MED ORDER — OMEPRAZOLE 40 MG PO CPDR: 40.0000 mg | DELAYED_RELEASE_CAPSULE | Freq: Every day | ORAL | Status: DC

## 2012-11-13 MED ORDER — METFORMIN HCL 1000 MG PO TABS: 1000.0000 mg | ORAL_TABLET | Freq: Two times a day (BID) | ORAL | Status: DC

## 2012-11-13 MED ORDER — BENAZEPRIL HCL 40 MG PO TABS: 40.0000 mg | ORAL_TABLET | Freq: Every day | ORAL | Status: DC

## 2012-11-13 MED ORDER — ROSUVASTATIN CALCIUM 20 MG PO TABS: 20.0000 mg | ORAL_TABLET | Freq: Every day | ORAL | Status: DC

## 2012-11-13 MED ORDER — CITALOPRAM HYDROBROMIDE 20 MG PO TABS: 20.0000 mg | ORAL_TABLET | Freq: Every day | ORAL | Status: DC

## 2012-11-13 MED ORDER — FUROSEMIDE 40 MG PO TABS: 40.0000 mg | ORAL_TABLET | Freq: Every day | ORAL | Status: DC

## 2012-11-13 MED ORDER — GABAPENTIN 300 MG PO CAPS: 300.0000 mg | ORAL_CAPSULE | Freq: Three times a day (TID) | ORAL | Status: DC

## 2012-11-13 MED ORDER — FENOFIBRATE 145 MG PO TABS: 145.0000 mg | ORAL_TABLET | Freq: Every day | ORAL | Status: DC

## 2012-11-13 MED ORDER — OXYCODONE-ACETAMINOPHEN 10-325 MG PO TABS: 1.0000 | ORAL_TABLET | Freq: Two times a day (BID) | ORAL | Status: DC

## 2012-11-13 MED ORDER — BUDESONIDE-FORMOTEROL FUMARATE 80-4.5 MCG/ACT IN AERO: INHALATION_SPRAY | RESPIRATORY_TRACT | Status: DC

## 2012-11-13 MED ORDER — CLONIDINE HCL 0.2 MG PO TABS: 0.2000 mg | ORAL_TABLET | Freq: Two times a day (BID) | ORAL | Status: DC

## 2012-11-13 MED ORDER — PREGABALIN 50 MG PO CAPS: 50.0000 mg | ORAL_CAPSULE | Freq: Three times a day (TID) | ORAL | Status: DC

## 2012-11-13 MED ORDER — CARVEDILOL 25 MG PO TABS: 25.0000 mg | ORAL_TABLET | Freq: Two times a day (BID) | ORAL | Status: DC

## 2012-11-13 NOTE — Progress Notes (Signed)
Subjective:    Patient ID: Anna [name], female    DOB: 1934/09/25, 78 y.o.   MRN: [id number]  HPI Patient here today for follow-up of chronic medical problems- SHe is doing well without complaints other than occassional dizziness. She is a diabetic and blood sugars running 130-140 fasting- Patiient does not watch diet and very little exercise. Patient Active Problem List   Diagnosis Date Noted  . Diabetes mellitus type 2, insulin dependent 07/16/2010  . Hypertension 07/16/2010  . Hyperlipidemia 07/16/2010  . COPD (chronic obstructive pulmonary disease) 07/16/2010  . Osteoarthritis 07/16/2010  . Diabetic neuropathy 07/16/2010  . RLS (restless legs syndrome) 07/16/2010  . Obesity 07/16/2010  . VITAMIN B12 DEFICIENCY 10/08/2009  . ANEMIA, MILD 10/08/2009   Outpatient Encounter Prescriptions as of 11/13/2012  Medication Sig Dispense Refill  . aspirin 81 MG tablet Take 81 mg by mouth daily.        . B Complex-C-Min-Fe-FA (HEMOCYTE PLUS PO) Take 1 tablet by mouth daily.        . benazepril (LOTENSIN) 40 MG tablet Take 40 mg by mouth daily.        . budesonide-formoterol (SYMBICORT) 80-4.5 MCG/ACT inhaler 1 Puff BID  3 Inhaler  0  . carvedilol (COREG) 25 MG tablet Take 25 mg by mouth 2 (two) times daily with a meal.        . citalopram (CELEXA) 20 MG tablet       . cloNIDine (CATAPRES) 0.2 MG tablet Take 0.2 mg by mouth 2 (two) times daily.        . CRESTOR 20 MG tablet       . cyanocobalamin (,VITAMIN B-12,) 1000 MCG/ML injection Inject 1,000 mcg into the muscle every 30 (thirty) days.        . ergocalciferol (VITAMIN D2) 50000 UNITS capsule Take 50,000 Units by mouth once a week.        . fenofibrate (TRICOR) 145 MG tablet Take 145 mg by mouth daily.        . furosemide (LASIX) 40 MG tablet Take 40 mg by mouth daily.        [name] gabapentin (NEURONTIN) 300 MG capsule Take 300 mg by mouth 3 (three) times daily.        . Insulin Aspart Prot & Aspart (NOVOLOG MIX 70/30 ) Inject 25 Units  into the skin 2 (two) times daily.        . metFORMIN (GLUCOPHAGE) 1000 MG tablet Take 1,000 mg by mouth 2 (two) times daily with a meal.        . omeprazole (PRILOSEC) 40 MG capsule Take 40 mg by mouth daily.        [name] oxyCODONE-acetaminophen (PERCOCET) 10-325 MG per tablet Take 1 tablet by mouth 2 (two) times daily.  60 tablet  0  . pregabalin (LYRICA) 50 MG capsule Take 1 capsule (50 mg total) by mouth 3 (three) times daily.  30 capsule  3  . [DISCONTINUED] atorvastatin (LIPITOR) 80 MG tablet Take 80 mg by mouth daily.         No facility-administered encounter medications on file as of 11/13/2012.       Review of Systems  All other systems reviewed and are negative.       Objective:   Physical Exam  Constitutional: She is oriented to person, place, and time. She appears well-developed and well-nourished.  HENT:  Nose: Nose normal.  Mouth/Throat: Oropharynx is clear and moist.  Eyes: EOM are normal.  Neck: Trachea normal,  normal range of motion and full passive range of motion without pain. Neck supple. No JVD present. Carotid bruit is not present. No thyromegaly present.  Cardiovascular: Normal rate, regular rhythm, normal heart sounds and intact distal pulses.  Exam reveals no gallop and no friction rub.   No murmur heard. Pulmonary/Chest: Effort normal and breath sounds normal.  Abdominal: Soft. Bowel sounds are normal. She exhibits no distension and no mass. There is no tenderness.  Musculoskeletal: Normal range of motion. She exhibits edema (2+ edema bil lower ext).  Lymphadenopathy:    She has no cervical adenopathy.  Neurological: She is alert and oriented to person, place, and time. She has normal reflexes.  Skin: Skin is warm and dry.  Psychiatric: She has a normal mood and affect. Her behavior is normal. Judgment and thought content normal.   BP 121/60  Pulse 60  Temp(Src) 97.3 F (36.3 C) (Oral)  Ht 5\' 2"  (1.575 m)  Wt 250 lb (113.399 kg)  BMI 45.71  kg/m2 Results for orders placed in visit on 11/13/12  POCT GLYCOSYLATED HEMOGLOBIN (HGB A1C)      Result Value Range   Hemoglobin A1C 6.8            Assessment & Plan:   1. Diabetes mellitus type 2, insulin dependent   2. Hypertension   3. Hyperlipidemia   4. Orthostatic hypotension   5. Chronic pain syndrome    Orders Placed This Encounter  Procedures  . COMPLETE METABOLIC PANEL WITH GFR  . NMR Lipoprofile with Lipids  . POCT glycosylated hemoglobin (Hb A1C)

## 2012-11-13 NOTE — Patient Instructions (Signed)

## 2012-11-14 LAB — COMPLETE METABOLIC PANEL WITH GFR
ALT: 18 U/L (ref 0–35)
AST: 21 U/L (ref 0–37)
Albumin: 4 g/dL (ref 3.5–5.2)
Alkaline Phosphatase: 39 U/L (ref 39–117)
GFR, Est Non African American: 22 mL/min — ABNORMAL LOW
Potassium: 5 mEq/L (ref 3.5–5.3)
Sodium: 142 mEq/L (ref 135–145)
Total Bilirubin: 0.5 mg/dL (ref 0.3–1.2)
Total Protein: 6.3 g/dL (ref 6.0–8.3)

## 2012-11-15 ENCOUNTER — Other Ambulatory Visit: Payer: Self-pay | Admitting: Nurse Practitioner

## 2012-11-15 LAB — NMR LIPOPROFILE WITH LIPIDS
Cholesterol, Total: 124 mg/dL (ref <200)
HDL Particle Number: 22.4 umol/L — ABNORMAL LOW (ref >=30.5)
HDL Size: 8.3 nm — ABNORMAL LOW (ref >=9.2)
HDL-C: 21 mg/dL — ABNORMAL LOW (ref >=40)
Large HDL-P: <1.3 umol/L — ABNORMAL LOW (ref >=4.8)
Large VLDL-P: 9.1 nmol/L — ABNORMAL HIGH (ref <=2.7)
Triglycerides: 341 mg/dL — ABNORMAL HIGH (ref <150)

## 2012-11-15 MED ORDER — GLIMEPIRIDE 4 MG PO TABS: 4.0000 mg | ORAL_TABLET | Freq: Every day | ORAL | Status: DC

## 2012-11-21 ENCOUNTER — Telehealth: Payer: Self-pay | Admitting: Nurse Practitioner

## 2012-11-22 NOTE — Telephone Encounter (Signed)
Spoke with daughter about labs and she still wants to talk to Paulene Floor about appt.

## 2012-11-25 NOTE — Telephone Encounter (Signed)
Spoke with daughter and patient. Patient states that she doesn't take metformin. That was stopped a while back. Should she start the amaryl?

## 2012-11-26 ENCOUNTER — Other Ambulatory Visit: Payer: Self-pay | Admitting: Nurse Practitioner

## 2012-12-02 ENCOUNTER — Telehealth: Payer: Self-pay | Admitting: Nurse Practitioner

## 2012-12-03 NOTE — Telephone Encounter (Signed)
Spoke with patient and she knows not to take metformin and to only take amaryl

## 2012-12-10 ENCOUNTER — Other Ambulatory Visit: Payer: Self-pay | Admitting: Nurse Practitioner

## 2012-12-16 ENCOUNTER — Telehealth: Payer: Self-pay | Admitting: Nurse Practitioner

## 2012-12-16 ENCOUNTER — Other Ambulatory Visit: Payer: Self-pay | Admitting: Nurse Practitioner

## 2012-12-16 MED ORDER — GLIMEPIRIDE 4 MG PO TABS: 4.0000 mg | ORAL_TABLET | Freq: Every day | ORAL | Status: DC

## 2012-12-16 NOTE — Telephone Encounter (Signed)
Pt says she thinks she will stop her lyrica, (didn't help), and just take the gabapentin.If she needs to take both, let her know. She has ask [facility] to send a request for glimepiride 4mg , qty 90. She will be out next week since you gave only a month's worth. She usually does mail order.

## 2012-12-16 NOTE — Telephone Encounter (Signed)
Ok to just take gabapentin Rx here to pick up for 90 day supply

## 2012-12-17 NOTE — Telephone Encounter (Signed)
Pt aware and rx sent to [facility] for glimepiride.

## 2013-01-27 ENCOUNTER — Other Ambulatory Visit: Payer: Self-pay | Admitting: Nurse Practitioner

## 2013-01-27 DIAGNOSIS — G894 Chronic pain syndrome: Secondary | ICD-10-CM

## 2013-01-28 MED ORDER — OXYCODONE-ACETAMINOPHEN 10-325 MG PO TABS: 1.0000 | ORAL_TABLET | Freq: Two times a day (BID) | ORAL | Status: DC

## 2013-01-28 NOTE — Telephone Encounter (Signed)
rx ready for pickup 

## 2013-01-28 NOTE — Telephone Encounter (Signed)
Last filled and seen 11/13/12, will print

## 2013-01-29 ENCOUNTER — Telehealth: Payer: Self-pay | Admitting: Nurse Practitioner

## 2013-01-30 NOTE — Telephone Encounter (Signed)
Still  hasnt received?

## 2013-01-31 ENCOUNTER — Other Ambulatory Visit: Payer: Self-pay | Admitting: Nurse Practitioner

## 2013-01-31 DIAGNOSIS — G894 Chronic pain syndrome: Secondary | ICD-10-CM

## 2013-01-31 MED ORDER — OXYCODONE-ACETAMINOPHEN 10-325 MG PO TABS: 1.0000 | ORAL_TABLET | Freq: Two times a day (BID) | ORAL | Status: DC

## 2013-01-31 NOTE — Telephone Encounter (Signed)
Rx given to patient.

## 2013-02-21 ENCOUNTER — Ambulatory Visit (INDEPENDENT_AMBULATORY_CARE_PROVIDER_SITE_OTHER): Payer: Medicare Other | Admitting: Nurse Practitioner

## 2013-02-21 ENCOUNTER — Encounter: Payer: Self-pay | Admitting: Nurse Practitioner

## 2013-02-21 VITALS — BP 155/70 | HR 61 | Temp 98.5°F | Ht 62.0 in | Wt 248.0 lb

## 2013-02-21 DIAGNOSIS — Z23 Encounter for immunization: Secondary | ICD-10-CM

## 2013-02-21 DIAGNOSIS — I10 Essential (primary) hypertension: Secondary | ICD-10-CM

## 2013-02-21 DIAGNOSIS — E785 Hyperlipidemia, unspecified: Secondary | ICD-10-CM

## 2013-02-21 DIAGNOSIS — Z794 Long term (current) use of insulin: Secondary | ICD-10-CM

## 2013-02-21 DIAGNOSIS — G894 Chronic pain syndrome: Secondary | ICD-10-CM

## 2013-02-21 DIAGNOSIS — E119 Type 2 diabetes mellitus without complications: Secondary | ICD-10-CM

## 2013-02-21 MED ORDER — OXYCODONE-ACETAMINOPHEN 10-325 MG PO TABS: 1.0000 | ORAL_TABLET | Freq: Two times a day (BID) | ORAL | Status: DC

## 2013-02-21 NOTE — Patient Instructions (Signed)

## 2013-02-21 NOTE — Progress Notes (Signed)
Subjective:    Patient ID: Anna [name], female    DOB: [birth year], 78 y.o.   MRN: [id number]  Hypertension This is a chronic problem. The current episode started more than 1 year ago. The problem has been waxing and waning since onset. The problem is uncontrolled. Associated symptoms include peripheral edema and shortness of breath. Pertinent negatives include no anxiety, blurred vision, chest pain or palpitations. Risk factors for coronary artery disease include diabetes mellitus, dyslipidemia, family history, post-menopausal state and obesity. Past treatments include ACE inhibitors and alpha 1 blockers. The current treatment provides moderate improvement. Compliance problems include exercise.  There is no history of CAD/MI, heart failure or a thyroid problem. There is no history of sleep apnea.  Hyperlipidemia This is a chronic problem. The current episode started more than 1 year ago. The problem is controlled. Recent lipid tests were reviewed and are normal. Exacerbating diseases include diabetes and obesity. She has no history of liver disease. Associated symptoms include shortness of breath. Pertinent negatives include no chest pain, leg pain or myalgias. Current antihyperlipidemic treatment includes statins. The current treatment provides moderate improvement of lipids. Compliance problems include adherence to exercise and adherence to diet.  Risk factors for coronary artery disease include diabetes mellitus, dyslipidemia, family history, hypertension, obesity, a sedentary lifestyle and post-menopausal.  Diabetes She presents for her follow-up diabetic visit. She has type 2 diabetes mellitus. Hypoglycemia symptoms include dizziness. Associated symptoms include fatigue, foot paresthesias and weakness. Pertinent negatives for diabetes include no blurred vision, no chest pain and no visual change. There are no hypoglycemic complications. Symptoms are stable. Diabetic complications include peripheral  neuropathy. Risk factors for coronary artery disease include diabetes mellitus, dyslipidemia, family history, hypertension, obesity and post-menopausal. Current diabetic treatment includes oral agent (monotherapy) and insulin injections. She is compliant with treatment all of the time. Her weight is stable. She is following a generally healthy diet. She rarely participates in exercise. Her home blood glucose trend is fluctuating dramatically. Her breakfast blood glucose is taken between 8-9 am. Her breakfast blood glucose range is generally 140-180 mg/dl. An ACE inhibitor/angiotensin II receptor blocker is being taken. She does not see a podiatrist.Eye exam is current (has appointment next week).  Gastrophageal Reflux She reports no chest pain, no coughing, no heartburn or no nausea. The current episode started more than 1 year ago. The problem occurs rarely. The problem has been resolved. The symptoms are aggravated by certain foods. Associated symptoms include fatigue. Pertinent negatives include no melena. Risk factors include obesity. She has tried a PPI for the symptoms. The treatment provided significant relief.  Osteoarthritis  Pt takes percocet 1 tab BID for pain knee, and back pain Peripheral Edema Pt takes lasix daily-Pt still has edema in lower extremities. COPD Pt takes Symbicort daily-Pt feels like it is well controlled Peripheral Neuropathy Pt takes neurontin 300 mg TID-Pt does not fell like this is helping. Pt states she still has trouble holding things in her hand and pain in hands and feet.    Review of Systems  Constitutional: Positive for fatigue.  Eyes: Negative for blurred vision.  Respiratory: Positive for shortness of breath. Negative for cough.   Cardiovascular: Negative for chest pain and palpitations.  Gastrointestinal: Negative for heartburn, nausea and melena.  Musculoskeletal: Negative for myalgias.  Neurological: Positive for dizziness and weakness.  All other  systems reviewed and are negative.       Objective:   Physical Exam  Vitals reviewed. Constitutional: She is oriented to  person, place, and time. She appears well-developed and well-nourished.  HENT:  Head: Normocephalic.  Nose: Nose normal.  Mouth/Throat: Oropharynx is clear and moist.  Eyes: Pupils are equal, round, and reactive to light.  Neck: Normal range of motion. Neck supple.  Cardiovascular: Normal rate, regular rhythm and intact distal pulses.   Murmur heard. Pulmonary/Chest: Effort normal and breath sounds normal.  Abdominal: Soft. Bowel sounds are normal. She exhibits no distension. There is no tenderness.  Musculoskeletal: Normal range of motion. She exhibits edema (2+ in LE).  Neurological: She is alert and oriented to person, place, and time.  Skin: Skin is warm and dry.  Psychiatric: She has a normal mood and affect. Her behavior is normal. Judgment and thought content normal.     BP 155/70  Pulse 61  Temp(Src) 98.5 F (36.9 C) (Oral)  Ht 5\' 2"  (1.575 m)  Wt 248 lb (112.492 kg)  BMI 45.35 kg/m2  Results for orders placed in visit on 02/21/13  POCT GLYCOSYLATED HEMOGLOBIN (HGB A1C)      Result Value Range   Hemoglobin A1C 6.6         Assessment & Plan:   1. Diabetes mellitus type 2, insulin dependent   2. Hypertension   3. Hyperlipidemia   4. Chronic pain syndrome    Orders Placed This Encounter  Procedures  . CMP14+EGFR  . NMR, lipoprofile  . POCT glycosylated hemoglobin (Hb A1C)   Meds ordered this encounter  Medications  . DISCONTD: LYRICA 50 MG capsule    Sig:   . oxyCODONE-acetaminophen (PERCOCET) 10-325 MG per tablet    Sig: Take 1 tablet by mouth 2 (two) times daily.    Dispense:  60 tablet    Refill:  0    Order Specific Question:  Supervising Provider    Answer:  Deborra Medina    Continue all meds Labs pending Diet and exercise encouraged Health maintenance reviewed Follow up in 3 months flu shot today hemocult  cards given  Mary-Margaret Daphine Deutscher, FNP

## 2013-02-22 LAB — CMP14+EGFR
ALT: 30 IU/L (ref 0–32)
AST: 39 IU/L (ref 0–40)
Albumin/Globulin Ratio: 1.9 (ref 1.1–2.5)
Alkaline Phosphatase: 47 IU/L (ref 39–117)
BUN/Creatinine Ratio: 24 (ref 11–26)
CO2: 26 mmol/L (ref 18–29)
Chloride: 98 mmol/L (ref 97–108)
GFR calc Af Amer: 34 mL/min/{1.73_m2} — ABNORMAL LOW (ref >59)
GFR calc non Af Amer: 29 mL/min/{1.73_m2} — ABNORMAL LOW (ref >59)
Glucose: 225 mg/dL — ABNORMAL HIGH (ref 65–99)
Potassium: 5.7 mmol/L — ABNORMAL HIGH (ref 3.5–5.2)
Sodium: 140 mmol/L (ref 134–144)
Total Bilirubin: 0.5 mg/dL (ref 0.0–1.2)
Total Protein: 6.7 g/dL (ref 6.0–8.5)

## 2013-02-22 LAB — NMR, LIPOPROFILE
LDL Particle Number: 1654 nmol/L — ABNORMAL HIGH (ref <1000)
LDL Size: 19.6 nm — ABNORMAL LOW (ref >20.5)
LDLC SERPL CALC-MCNC: 48 mg/dL (ref <100)
LP-IR Score: 78 — ABNORMAL HIGH (ref <=45)
Small LDL Particle Number: 1570 nmol/L — ABNORMAL HIGH (ref <=527)
Triglycerides by NMR: 342 mg/dL — ABNORMAL HIGH (ref <150)

## 2013-03-04 ENCOUNTER — Other Ambulatory Visit: Payer: Self-pay | Admitting: Nurse Practitioner

## 2013-03-04 ENCOUNTER — Telehealth: Payer: Self-pay | Admitting: Nurse Practitioner

## 2013-03-05 ENCOUNTER — Other Ambulatory Visit: Payer: Medicare Other

## 2013-03-06 MED ORDER — INSULIN ASPART PROT & ASPART (70-30 MIX) 100 UNIT/ML PEN: 55.0000 [IU] | PEN_INJECTOR | Freq: Two times a day (BID) | SUBCUTANEOUS | Status: DC

## 2013-03-06 NOTE — Telephone Encounter (Signed)
Pt would like to go back to the insulin pens.  They are easier to use. She wondered if her insurance would cover them.  Can you send in rx?

## 2013-03-06 NOTE — Telephone Encounter (Signed)
rx changed

## 2013-03-11 ENCOUNTER — Telehealth: Payer: Self-pay | Admitting: Family Medicine

## 2013-03-11 ENCOUNTER — Other Ambulatory Visit: Payer: Self-pay | Admitting: Nurse Practitioner

## 2013-04-01 ENCOUNTER — Other Ambulatory Visit: Payer: Self-pay | Admitting: Nurse Practitioner

## 2013-04-03 ENCOUNTER — Encounter: Payer: Self-pay | Admitting: *Deleted

## 2013-04-07 ENCOUNTER — Telehealth: Payer: Self-pay | Admitting: Nurse Practitioner

## 2013-04-07 DIAGNOSIS — G894 Chronic pain syndrome: Secondary | ICD-10-CM

## 2013-04-07 MED ORDER — OXYCODONE-ACETAMINOPHEN 10-325 MG PO TABS: 1.0000 | ORAL_TABLET | Freq: Two times a day (BID) | ORAL | Status: DC

## 2013-04-07 NOTE — Telephone Encounter (Signed)
Patient aware.

## 2013-04-07 NOTE — Telephone Encounter (Signed)
rx ready for pickup 

## 2013-04-21 ENCOUNTER — Other Ambulatory Visit: Payer: Self-pay | Admitting: Nurse Practitioner

## 2013-04-28 ENCOUNTER — Telehealth: Payer: Self-pay | Admitting: Nurse Practitioner

## 2013-04-30 ENCOUNTER — Other Ambulatory Visit: Payer: Self-pay | Admitting: Nurse Practitioner

## 2013-05-08 MED ORDER — GLUCOSE BLOOD VI STRP: ORAL_STRIP | Status: DC

## 2013-05-08 NOTE — Telephone Encounter (Signed)
Cannot find max test strips in computer- please call patient and make sure of brand of glucose machine

## 2013-05-08 NOTE — Telephone Encounter (Signed)
Patient said that Max is the brand she uses

## 2013-05-08 NOTE — Telephone Encounter (Signed)
Sent in hope they are right

## 2013-05-09 ENCOUNTER — Other Ambulatory Visit: Payer: Self-pay | Admitting: Nurse Practitioner

## 2013-05-09 MED ORDER — INSULIN ASPART PROT & ASPART (70-30 MIX) 100 UNIT/ML PEN: 55.0000 [IU] | PEN_INJECTOR | Freq: Two times a day (BID) | SUBCUTANEOUS | Status: DC

## 2013-05-14 ENCOUNTER — Telehealth: Payer: Self-pay | Admitting: Family Medicine

## 2013-05-26 ENCOUNTER — Ambulatory Visit: Payer: Medicare Other | Admitting: Nurse Practitioner

## 2013-05-27 ENCOUNTER — Other Ambulatory Visit: Payer: Self-pay | Admitting: Nurse Practitioner

## 2013-05-28 ENCOUNTER — Ambulatory Visit (INDEPENDENT_AMBULATORY_CARE_PROVIDER_SITE_OTHER): Payer: Medicare Other | Admitting: Nurse Practitioner

## 2013-05-28 ENCOUNTER — Encounter: Payer: Self-pay | Admitting: Nurse Practitioner

## 2013-05-28 VITALS — BP 133/47 | HR 64 | Temp 97.3°F | Ht 62.0 in | Wt 247.0 lb

## 2013-05-28 DIAGNOSIS — J4489 Other specified chronic obstructive pulmonary disease: Secondary | ICD-10-CM

## 2013-05-28 DIAGNOSIS — E119 Type 2 diabetes mellitus without complications: Secondary | ICD-10-CM

## 2013-05-28 DIAGNOSIS — Z794 Long term (current) use of insulin: Secondary | ICD-10-CM

## 2013-05-28 DIAGNOSIS — J449 Chronic obstructive pulmonary disease, unspecified: Secondary | ICD-10-CM

## 2013-05-28 DIAGNOSIS — I10 Essential (primary) hypertension: Secondary | ICD-10-CM

## 2013-05-28 DIAGNOSIS — G894 Chronic pain syndrome: Secondary | ICD-10-CM

## 2013-05-28 DIAGNOSIS — E785 Hyperlipidemia, unspecified: Secondary | ICD-10-CM

## 2013-05-28 LAB — POCT GLYCOSYLATED HEMOGLOBIN (HGB A1C): Hemoglobin A1C: 7.5

## 2013-05-28 MED ORDER — OXYCODONE-ACETAMINOPHEN 10-325 MG PO TABS: 1.0000 | ORAL_TABLET | Freq: Two times a day (BID) | ORAL | Status: DC

## 2013-05-28 NOTE — Patient Instructions (Signed)

## 2013-05-28 NOTE — Progress Notes (Signed)
Subjective:    Patient ID: Anna [name], female    DOB: 1934-09-14, 79 y.o.   MRN: [id number]  HPI Pt in today for chronic medical follow up.  Pt states she is SOB daily with activity.  Uses hover-round daily when outside of the house.  Has not been using Symbicort more than twice weekly.  Blood sugars have been >170.  Percocet is ineffective for leg pain.  Lower legs   Hypertension This is a chronic problem. The current episode started more than 1 year ago. The problem has been waxing and waning since onset. The problem is uncontrolled. Associated symptoms include peripheral edema and shortness of breath. Pertinent negatives include no anxiety, blurred vision, chest pain or palpitations. Risk factors for coronary artery disease include diabetes mellitus, dyslipidemia, family history, post-menopausal state and obesity. Past treatments include ACE inhibitors and alpha 1 blockers. The current treatment provides moderate improvement. Compliance problems include exercise.  There is no history of CAD/MI, heart failure or a thyroid problem. There is no history of sleep apnea.  Hyperlipidemia This is a chronic problem. The current episode started more than 1 year ago. The problem is controlled. Recent lipid tests were reviewed and are normal. Exacerbating diseases include diabetes and obesity. She has no history of liver disease. Associated symptoms include shortness of breath. Pertinent negatives include no chest pain, leg pain or myalgias. Current antihyperlipidemic treatment includes statins. The current treatment provides moderate improvement of lipids. Compliance problems include adherence to exercise and adherence to diet.  Risk factors for coronary artery disease include diabetes mellitus, dyslipidemia, family history, hypertension, obesity, a sedentary lifestyle and post-menopausal.  Diabetes She presents for her follow-up diabetic visit. She has type 2 diabetes mellitus. Hypoglycemia symptoms  include dizziness. Associated symptoms include fatigue, foot paresthesias and weakness. Pertinent negatives for diabetes include no blurred vision, no chest pain and no visual change. There are no hypoglycemic complications. Symptoms are stable. Diabetic complications include peripheral neuropathy. Risk factors for coronary artery disease include diabetes mellitus, dyslipidemia, family history, hypertension, obesity and post-menopausal. Current diabetic treatment includes oral agent (monotherapy) and insulin injections. She is compliant with treatment all of the time. Her weight is stable. She is following a generally healthy diet. She rarely participates in exercise. Her home blood glucose trend is fluctuating dramatically. Her breakfast blood glucose is taken between 8-9 am. Her breakfast blood glucose range is generally 140-180 mg/dl. An ACE inhibitor/angiotensin II receptor blocker is being taken. She does not see a podiatrist.Eye exam is current (has appointment next week).  Gastrophageal Reflux She reports no chest pain, no coughing, no heartburn or no nausea. The current episode started more than 1 year ago. The problem occurs rarely. The problem has been resolved. The symptoms are aggravated by certain foods. Associated symptoms include fatigue. Pertinent negatives include no melena. Risk factors include obesity. She has tried a PPI for the symptoms. The treatment provided significant relief.  Osteoarthritis  Pt takes percocet 1 tab BID for pain knee, and back pain.  States Percocet is ineffective for pain control. Peripheral Edema Pt takes lasix daily-Pt still has edema in lower extremities. COPD Pt is supposed to be on Symbicort, but is not taking it because she says it makes her mouth sore.   Peripheral Neuropathy Pt takes neurontin 300 mg TID-Pt does not fell like this is helping. Pt states she still has trouble holding things in her hand and pain in hands and feet.     Review of Systems  Constitutional: Positive for fatigue. Negative for fever, chills and activity change.  HENT: Negative for postnasal drip, rhinorrhea and sore throat.   Respiratory: Positive for shortness of breath. Negative for cough, chest tightness and wheezing.   Cardiovascular: Negative for chest pain, palpitations and leg swelling.  Gastrointestinal: Negative for abdominal pain and blood in stool.  Genitourinary: Negative for dysuria and frequency.  Musculoskeletal: Positive for arthralgias. Negative for back pain and joint swelling.  Skin: Negative for rash.  Neurological: Negative for headaches.  All other systems reviewed and are negative.       Objective:   Physical Exam  Constitutional: She is oriented to person, place, and time. She appears well-developed and well-nourished.  HENT:  Nose: Nose normal.  Mouth/Throat: Oropharynx is clear and moist.  Eyes: EOM are normal.  Neck: Trachea normal, normal range of motion and full passive range of motion without pain. Neck supple. No JVD present. Carotid bruit is not present. No thyromegaly present.  Cardiovascular: Normal rate, regular rhythm, normal heart sounds and intact distal pulses.  Exam reveals no gallop and no friction rub.   No murmur heard. Pulmonary/Chest: Effort normal. She has wheezes (finat exp wheezes bil bases).  Abdominal: Soft. Bowel sounds are normal. She exhibits no distension and no mass. There is no tenderness.  Musculoskeletal: Normal range of motion.  Lymphadenopathy:    She has no cervical adenopathy.  Neurological: She is alert and oriented to person, place, and time. She has normal reflexes.  Skin: Skin is warm and dry.  Psychiatric: She has a normal mood and affect. Her behavior is normal. Judgment and thought content normal.  BP 133/47  Pulse 64  Temp(Src) 97.3 F (36.3 C) (Oral)  Ht 5' 2"  (1.575 m)  Wt 247 lb (112.038 kg)  BMI 45.17 kg/m2 Results for orders placed in visit on 05/28/13  POCT GLYCOSYLATED  HEMOGLOBIN (HGB A1C)      Result Value Range   Hemoglobin A1C 7.5             Assessment & Plan:   1. Diabetes mellitus type 2, insulin dependent   2. Hypertension   3. Hyperlipidemia   4. Chronic pain syndrome   5. COPD (chronic obstructive pulmonary disease)    Orders Placed This Encounter  Procedures  . CMP14+EGFR  . NMR, lipoprofile  . POCT glycosylated hemoglobin (Hb A1C)   Meds ordered this encounter  Medications  . oxyCODONE-acetaminophen (PERCOCET) 10-325 MG per tablet    Sig: Take 1 tablet by mouth 2 (two) times daily.    Dispense:  60 tablet    Refill:  0    Order Specific Question:  Supervising Provider    Answer:  Chipper Herb [[id number]]  take percocet BID instead of 1/2 tablet Patient told to get back on symbicort- rinse mouth with butter milk Lasix in am and at 1 pm in afternoon Labs pending Health maintenance reviewed Diet and exercise encouraged Continue all meds Follow up  In 3 months   [place], FNP

## 2013-05-30 ENCOUNTER — Other Ambulatory Visit: Payer: Self-pay | Admitting: Nurse Practitioner

## 2013-05-30 LAB — CMP14+EGFR
ALBUMIN: 4.3 g/dL (ref 3.5–4.8)
ALK PHOS: 51 IU/L (ref 39–117)
ALT: 29 IU/L (ref 0–32)
AST: 46 IU/L — AB (ref 0–40)
Albumin/Globulin Ratio: 2 (ref 1.1–2.5)
BUN/Creatinine Ratio: 25 (ref 11–26)
BUN: 50 mg/dL — ABNORMAL HIGH (ref 8–27)
CHLORIDE: 102 mmol/L (ref 97–108)
CO2: 20 mmol/L (ref 18–29)
Calcium: 9.5 mg/dL (ref 8.7–10.3)
Creatinine, Ser: 2.01 mg/dL — ABNORMAL HIGH (ref 0.57–1.00)
GFR calc non Af Amer: 24 mL/min/{1.73_m2} — ABNORMAL LOW (ref >59)
GFR, EST AFRICAN AMERICAN: 27 mL/min/{1.73_m2} — AB (ref >59)
GLOBULIN, TOTAL: 2.1 g/dL (ref 1.5–4.5)
Glucose: 174 mg/dL — ABNORMAL HIGH (ref 65–99)
Potassium: 5.1 mmol/L (ref 3.5–5.2)
SODIUM: 143 mmol/L (ref 134–144)
Total Bilirubin: 0.4 mg/dL (ref 0.0–1.2)
Total Protein: 6.4 g/dL (ref 6.0–8.5)

## 2013-05-30 LAB — NMR, LIPOPROFILE
CHOLESTEROL: 133 mg/dL (ref <200)
HDL Cholesterol by NMR: 23 mg/dL — ABNORMAL LOW (ref >=40)
HDL PARTICLE NUMBER: 22.4 umol/L — AB (ref >=30.5)
LDL PARTICLE NUMBER: 1338 nmol/L — AB (ref <1000)
LDL SIZE: 19.7 nm — AB (ref >20.5)
LDLC SERPL CALC-MCNC: 40 mg/dL (ref <100)
LP-IR Score: 77 — ABNORMAL HIGH (ref <=45)
Small LDL Particle Number: 1124 nmol/L — ABNORMAL HIGH (ref <=527)
Triglycerides by NMR: 351 mg/dL — ABNORMAL HIGH (ref <150)

## 2013-05-30 MED ORDER — INSULIN ASPART PROT & ASPART (70-30 MIX) 100 UNIT/ML PEN
60.0000 [IU] | PEN_INJECTOR | Freq: Two times a day (BID) | SUBCUTANEOUS | Status: DC
Start: 2013-05-30 — End: 2013-07-14

## 2013-06-05 ENCOUNTER — Telehealth: Payer: Self-pay | Admitting: Nurse Practitioner

## 2013-06-05 MED ORDER — MAGIC MOUTHWASH: 5.0000 mL | Freq: Four times a day (QID) | ORAL | Status: DC

## 2013-06-05 NOTE — Telephone Encounter (Signed)
Please advise

## 2013-06-10 ENCOUNTER — Telehealth: Payer: Self-pay | Admitting: Nurse Practitioner

## 2013-06-11 NOTE — Telephone Encounter (Signed)
Was ent to [facility] on 06/05/13- with confirmed receipt from pharmacy

## 2013-06-12 MED ORDER — MAGIC MOUTHWASH: 5.0000 mL | Freq: Four times a day (QID) | ORAL | Status: DC

## 2013-06-12 NOTE — Telephone Encounter (Signed)
Please resend rx to [facility] because they still say its not there.

## 2013-06-25 ENCOUNTER — Other Ambulatory Visit: Payer: Self-pay | Admitting: Nurse Practitioner

## 2013-06-30 ENCOUNTER — Telehealth: Payer: Self-pay | Admitting: Nurse Practitioner

## 2013-06-30 DIAGNOSIS — G894 Chronic pain syndrome: Secondary | ICD-10-CM

## 2013-06-30 MED ORDER — OXYCODONE-ACETAMINOPHEN 10-325 MG PO TABS: 1.0000 | ORAL_TABLET | Freq: Two times a day (BID) | ORAL | Status: DC

## 2013-06-30 NOTE — Telephone Encounter (Signed)
rx ready for pickup 

## 2013-06-30 NOTE — Telephone Encounter (Signed)
PAtient aware 

## 2013-07-01 ENCOUNTER — Other Ambulatory Visit: Payer: Self-pay | Admitting: Nurse Practitioner

## 2013-07-03 ENCOUNTER — Telehealth: Payer: Self-pay | Admitting: Nurse Practitioner

## 2013-07-08 ENCOUNTER — Telehealth: Payer: Self-pay | Admitting: Nurse Practitioner

## 2013-07-08 NOTE — Telephone Encounter (Signed)
Blood sugar levels are dropping in the morning at taking insulin the evening before. Patient is symptomatic and some readings have been in the 50's.  She administers insulin in the morning as well and her blood sugar drops within the hour and her family has to give her orange juice and food.  She is using Novolog 70/30 flexpen 60 units BID. Prior to being given the Flexpen she was using 54 units BID.   You can call patient directly at [phone].

## 2013-07-08 NOTE — Telephone Encounter (Signed)
Appt scheduled. Asked them to keep a record of her blood sugar and what time she administered insulin and ate. They will call back if symptoms worsen before appt.

## 2013-07-08 NOTE — Telephone Encounter (Signed)
Needs follow up with Tammy ASAP

## 2013-07-14 ENCOUNTER — Ambulatory Visit (INDEPENDENT_AMBULATORY_CARE_PROVIDER_SITE_OTHER): Payer: Medicare Other | Admitting: Pharmacist

## 2013-07-14 ENCOUNTER — Encounter: Payer: Self-pay | Admitting: Pharmacist

## 2013-07-14 VITALS — BP 144/68 | HR 68

## 2013-07-14 DIAGNOSIS — E119 Type 2 diabetes mellitus without complications: Secondary | ICD-10-CM

## 2013-07-14 DIAGNOSIS — Z794 Long term (current) use of insulin: Secondary | ICD-10-CM

## 2013-07-14 DIAGNOSIS — I10 Essential (primary) hypertension: Secondary | ICD-10-CM

## 2013-07-14 MED ORDER — INSULIN ASPART PROT & ASPART (70-30 MIX) 100 UNIT/ML PEN: 58.0000 [IU] | PEN_INJECTOR | Freq: Two times a day (BID) | SUBCUTANEOUS | Status: DC

## 2013-07-14 NOTE — Patient Instructions (Signed)
Stop glimepiride 4mg  tablets - if you see blood glucose increase to over 120 in morning or 200 after meals then restart at 1/2 tablet each morning.   Decrease Novolog mix 70/30 to 58 units prior to breakfast and 58 units prior to supper.  Call me if you have questions or experience lows (less than 80) or highs (over 200).

## 2013-07-14 NOTE — Progress Notes (Signed)
Diabetes Follow-Up Visit Chief Complaint:   Chief Complaint  Patient presents with  . Diabetes     Filed Vitals:   07/14/13 1555  BP: 144/68  Pulse: 68     HPI: Patient and her daughter here today to discuss diabetes.  Anna [name] was been experiencing hypoglycemia 2 weeks ago.  To help with hypoglycemia the last 2 weeks her family has been supplementing her diet with food with higher CHO content.  This has helped decrease hypoglycemic episodes but concern for the increase in CHO intake.   Current Diabetes Medications:  Glimepiride 4mg  1 tablet qam, Novolog Mix 70/30 60 units BID  Home BG Monitoring:  Checking 3 times a day. Average:  134  High: 227  Low:  50  Low fat/carbohydrate diet?  No Nicotine Abuse?  No Medication Compliance?  Yes Exercise?  No Alcohol Abuse?  No   Exam Edema:  Negative  Polyuria:  negative  Polydipsia:  negative Polyphagia:  negative  BMI:  There is no weight on file to calculate BMI.    General Appearance:  alert, oriented, no acute distress and obese Mood/Affect:  normal   Lab Results  Component Value Date   HGBA1C 7.5 05/28/2013    No results found for this basenameDerl Barrow    Lab Results  Component Value Date   CHOL 133 05/28/2013   HDL 35* 01/21/2008   LDLCALC 35 11/13/2012   TRIG 341* 11/13/2012   CHOLHDL 4.0 01/21/2008      Assessment: 1.  Diabetes.  Uncontrolled with increased hypoglycemia 2.  Blood Pressure.  controlled   Recommendations: 1.  Medication recommendations at this time are as follows:    Stop glimepiride 4mg  tablets - if you see blood glucose increase to over 120 in morning or 200 after meals then restart at 1/2 tablet each morning.    Decrease Novolog mix 70/30 to 58 units prior to breakfast and 58 units prior to supper.   Call me if you have questions or experience lows (less than 80) or highs (over 200).   2.  Reviewed HBG goals:  Fasting 80-130 and 1-2 hour post prandial <180.  Patient is  instructed to check BG 3 times per day.    3.  BP goal < 140/85. 4.  LDL goal of < 100, HDL > 40 and TG < 150. 5.  Reviewed s/s of hypoglycemia.  Also review how to treat hypoglycemia (4 ounces of juice or regular soda, 4 glucose tablets(gave sample) or 5 hard non sugar free candy). 6.  Return to clinic in 4-6 wks   Time spent counseling patient:  30 minutes  Referring provider:  Rosie Fate, PharmD, CPP

## 2013-07-23 ENCOUNTER — Other Ambulatory Visit: Payer: Self-pay | Admitting: Nurse Practitioner

## 2013-07-28 ENCOUNTER — Other Ambulatory Visit: Payer: Self-pay | Admitting: Nurse Practitioner

## 2013-07-28 DIAGNOSIS — G894 Chronic pain syndrome: Secondary | ICD-10-CM

## 2013-07-28 MED ORDER — OXYCODONE-ACETAMINOPHEN 10-325 MG PO TABS: 1.0000 | ORAL_TABLET | Freq: Two times a day (BID) | ORAL | Status: DC

## 2013-07-28 NOTE — Telephone Encounter (Signed)
Dr. Laurance Flatten could you please approve patients oxycodone- she takes for osteoarthtitis and diabetic neuropathy pain

## 2013-07-28 NOTE — Telephone Encounter (Signed)
Please sign order

## 2013-07-28 NOTE — Telephone Encounter (Signed)
Patient aware rx ready to be picked up 

## 2013-07-28 NOTE — Telephone Encounter (Signed)
This is okay to refill 

## 2013-08-18 ENCOUNTER — Ambulatory Visit: Payer: Medicare Other

## 2013-08-20 ENCOUNTER — Other Ambulatory Visit: Payer: Self-pay | Admitting: Nurse Practitioner

## 2013-09-01 ENCOUNTER — Telehealth: Payer: Self-pay | Admitting: Nurse Practitioner

## 2013-09-01 DIAGNOSIS — G894 Chronic pain syndrome: Secondary | ICD-10-CM

## 2013-09-01 MED ORDER — OXYCODONE-ACETAMINOPHEN 10-325 MG PO TABS: 1.0000 | ORAL_TABLET | Freq: Two times a day (BID) | ORAL | Status: DC

## 2013-09-01 NOTE — Telephone Encounter (Signed)
Patient aware to pick up 

## 2013-09-01 NOTE — Telephone Encounter (Signed)
rx ready for pickup 

## 2013-09-17 ENCOUNTER — Other Ambulatory Visit: Payer: Self-pay | Admitting: Nurse Practitioner

## 2013-09-23 NOTE — Telephone Encounter (Signed)
Last seen Tammy 3/15  Last seen MMM 05/28/13 and last glucose 05/28/13

## 2013-09-29 ENCOUNTER — Telehealth: Payer: Self-pay | Admitting: Family Medicine

## 2013-09-29 DIAGNOSIS — G894 Chronic pain syndrome: Secondary | ICD-10-CM

## 2013-09-29 MED ORDER — OXYCODONE-ACETAMINOPHEN 10-325 MG PO TABS: 1.0000 | ORAL_TABLET | Freq: Two times a day (BID) | ORAL | Status: DC

## 2013-09-29 NOTE — Telephone Encounter (Signed)
Patient aware to pick up 

## 2013-09-29 NOTE — Telephone Encounter (Signed)
rx ready for pickup 

## 2013-10-16 ENCOUNTER — Other Ambulatory Visit: Payer: Self-pay | Admitting: Nurse Practitioner

## 2013-10-20 NOTE — Telephone Encounter (Signed)
Patient NTBS for follow up and lab work  

## 2013-11-03 ENCOUNTER — Telehealth: Payer: Self-pay | Admitting: Family Medicine

## 2013-11-03 NOTE — Telephone Encounter (Signed)
Appt given per patients request 

## 2013-11-04 ENCOUNTER — Telehealth: Payer: Self-pay | Admitting: Nurse Practitioner

## 2013-11-04 DIAGNOSIS — G894 Chronic pain syndrome: Secondary | ICD-10-CM

## 2013-11-04 MED ORDER — OXYCODONE-ACETAMINOPHEN 10-325 MG PO TABS: 1.0000 | ORAL_TABLET | Freq: Two times a day (BID) | ORAL | Status: DC

## 2013-11-04 NOTE — Telephone Encounter (Signed)
rx ready for pickup 

## 2013-11-05 NOTE — Telephone Encounter (Signed)
Patient aware.

## 2013-11-10 ENCOUNTER — Ambulatory Visit (INDEPENDENT_AMBULATORY_CARE_PROVIDER_SITE_OTHER): Payer: Medicare Other | Admitting: Nurse Practitioner

## 2013-11-10 ENCOUNTER — Encounter: Payer: Self-pay | Admitting: Nurse Practitioner

## 2013-11-10 VITALS — BP 130/58 | HR 71 | Temp 99.0°F | Ht 62.0 in | Wt 243.0 lb

## 2013-11-10 DIAGNOSIS — E0843 Diabetes mellitus due to underlying condition with diabetic autonomic (poly)neuropathy: Secondary | ICD-10-CM

## 2013-11-10 DIAGNOSIS — G2581 Restless legs syndrome: Secondary | ICD-10-CM

## 2013-11-10 DIAGNOSIS — E119 Type 2 diabetes mellitus without complications: Secondary | ICD-10-CM

## 2013-11-10 DIAGNOSIS — K219 Gastro-esophageal reflux disease without esophagitis: Secondary | ICD-10-CM | POA: Insufficient documentation

## 2013-11-10 DIAGNOSIS — D649 Anemia, unspecified: Secondary | ICD-10-CM

## 2013-11-10 DIAGNOSIS — E785 Hyperlipidemia, unspecified: Secondary | ICD-10-CM

## 2013-11-10 DIAGNOSIS — J410 Simple chronic bronchitis: Secondary | ICD-10-CM

## 2013-11-10 DIAGNOSIS — Z794 Long term (current) use of insulin: Secondary | ICD-10-CM

## 2013-11-10 DIAGNOSIS — M150 Primary generalized (osteo)arthritis: Secondary | ICD-10-CM

## 2013-11-10 DIAGNOSIS — E669 Obesity, unspecified: Secondary | ICD-10-CM

## 2013-11-10 DIAGNOSIS — E538 Deficiency of other specified B group vitamins: Secondary | ICD-10-CM

## 2013-11-10 DIAGNOSIS — E1349 Other specified diabetes mellitus with other diabetic neurological complication: Secondary | ICD-10-CM

## 2013-11-10 DIAGNOSIS — M159 Polyosteoarthritis, unspecified: Secondary | ICD-10-CM

## 2013-11-10 DIAGNOSIS — G909 Disorder of the autonomic nervous system, unspecified: Secondary | ICD-10-CM

## 2013-11-10 DIAGNOSIS — I10 Essential (primary) hypertension: Secondary | ICD-10-CM

## 2013-11-10 LAB — POCT GLYCOSYLATED HEMOGLOBIN (HGB A1C): Hemoglobin A1C: 6.9

## 2013-11-10 MED ORDER — GABAPENTIN 300 MG PO CAPS: 300.0000 mg | ORAL_CAPSULE | Freq: Four times a day (QID) | ORAL | Status: DC

## 2013-11-10 NOTE — Progress Notes (Signed)
Subjective:    Patient ID: Anna [name], female    DOB: 06/07/1934, 79 y.o.   MRN: [id number]  Patient here today for follow up of chronic medical problems. Patien tis doing weel- Her only complaint is worsening foot paiun that is discussed below.  Hypertension This is a chronic problem. The current episode started more than 1 year ago. The problem has been waxing and waning since onset. The problem is uncontrolled. Associated symptoms include peripheral edema and shortness of breath. Pertinent negatives include no anxiety, blurred vision, chest pain or palpitations. Risk factors for coronary artery disease include diabetes mellitus, dyslipidemia, family history, post-menopausal state and obesity. Past treatments include ACE inhibitors and alpha 1 blockers. The current treatment provides moderate improvement. Compliance problems include exercise.  There is no history of CAD/MI, heart failure or a thyroid problem. There is no history of sleep apnea.  Hyperlipidemia This is a chronic problem. The current episode started more than 1 year ago. The problem is controlled. Recent lipid tests were reviewed and are normal. Exacerbating diseases include diabetes and obesity. She has no history of liver disease. Associated symptoms include shortness of breath. Pertinent negatives include no chest pain, leg pain or myalgias. Current antihyperlipidemic treatment includes statins. The current treatment provides moderate improvement of lipids. Compliance problems include adherence to exercise and adherence to diet.  Risk factors for coronary artery disease include diabetes mellitus, dyslipidemia, family history, hypertension, obesity, a sedentary lifestyle and post-menopausal.  Diabetes She presents for her follow-up diabetic visit. She has type 2 diabetes mellitus. Hypoglycemia symptoms include dizziness. Associated symptoms include fatigue, foot paresthesias and weakness. Pertinent negatives for diabetes include no  blurred vision, no chest pain and no visual change. There are no hypoglycemic complications. Symptoms are stable. Diabetic complications include peripheral neuropathy. Risk factors for coronary artery disease include diabetes mellitus, dyslipidemia, family history, hypertension, obesity and post-menopausal. Current diabetic treatment includes oral agent (monotherapy) and insulin injections. She is compliant with treatment all of the time. Her weight is stable. She is following a generally healthy diet. She rarely participates in exercise. Her home blood glucose trend is fluctuating dramatically. Her breakfast blood glucose is taken between 8-9 am. Her breakfast blood glucose range is generally 140-180 mg/dl. An ACE inhibitor/angiotensin II receptor blocker is being taken. She does not see a podiatrist.Eye exam is current (has appointment next week).  Gastrophageal Reflux She reports no chest pain, no coughing, no heartburn or no nausea. The current episode started more than 1 year ago. The problem occurs rarely. The problem has been resolved. The symptoms are aggravated by certain foods. Associated symptoms include fatigue. Pertinent negatives include no melena. Risk factors include obesity. She has tried a PPI for the symptoms. The treatment provided significant relief.  Osteoarthritis  Pt takes percocet 1 tab BID for pain knee, and back pain Peripheral Edema Pt takes lasix daily-Pt still has edema in lower extremities. COPD Pt takes Symbicort daily-Pt feels like it is well controlled Peripheral Neuropathy Pt takes neurontin 300 mg TID-Pt does not fell like this is helping. Still  Has lots of nerve pain in her feet- seems to be worse at night.  Review of Systems  Constitutional: Positive for fatigue.  Eyes: Negative for blurred vision.  Respiratory: Positive for shortness of breath. Negative for cough.   Cardiovascular: Negative for chest pain and palpitations.  Gastrointestinal: Negative for  heartburn, nausea and melena.  Musculoskeletal: Negative for myalgias.  Neurological: Positive for dizziness and weakness.  All other systems  reviewed and are negative.      Objective:   Physical Exam  Vitals reviewed. Constitutional: She is oriented to person, place, and time. She appears well-developed and well-nourished.  HENT:  Head: Normocephalic.  Nose: Nose normal.  Mouth/Throat: Oropharynx is clear and moist.  Eyes: Pupils are equal, round, and reactive to light.  Neck: Normal range of motion. Neck supple.  Cardiovascular: Normal rate, regular rhythm and intact distal pulses.   Murmur heard. Pulmonary/Chest: Effort normal and breath sounds normal.  Abdominal: Soft. Bowel sounds are normal. She exhibits no distension. There is no tenderness.  Musculoskeletal: Normal range of motion. She exhibits edema (2+ in LE).  Neurological: She is alert and oriented to person, place, and time.  Skin: Skin is warm and dry.  Psychiatric: She has a normal mood and affect. Her behavior is normal. Judgment and thought content normal.     BP 130/58  Pulse 71  Temp(Src) 99 F (37.2 C) (Oral)  Ht 5' 2"  (1.575 m)  Wt 243 lb (110.224 kg)  BMI 44.43 kg/m2  Results for orders placed in visit on 11/10/13  POCT GLYCOSYLATED HEMOGLOBIN (HGB A1C)      Result Value Ref Range   Hemoglobin A1C 6.9         Assessment & Plan:   1. Diabetes mellitus type 2, insulin dependent   2. Essential hypertension   3. Hyperlipidemia   4. RLS (restless legs syndrome)   5. VITAMIN B12 DEFICIENCY   6. Primary osteoarthritis involving multiple joints   7. Obesity   8. Diabetic autonomic neuropathy associated with diabetes mellitus due to underlying condition   9. Simple chronic bronchitis   10. ANEMIA, MILD   11. Gastroesophageal reflux disease without esophagitis    Orders Placed This Encounter  Procedures  . CMP14+EGFR  . NMR, lipoprofile  . POCT glycosylated hemoglobin (Hb A1C)  . HM DIABETES  EYE EXAM    This external order was created through the Results Console.   Meds ordered this encounter  Medications  . gabapentin (NEURONTIN) 300 MG capsule    Sig: Take 1 capsule (300 mg total) by mouth 4 (four) times daily.    Dispense:  120 capsule    Refill:  5    Order Specific Question:  Supervising Provider    Answer:  Laurance Flatten, DONALD W [[id number]]  increased neurotin dose from TID to QID Patient to make appointment for eye exam Labs pending Health maintenance reviewed Diet and exercise encouraged Continue all meds Follow up  In 3 month   [place], FNP

## 2013-11-10 NOTE — Patient Instructions (Signed)

## 2013-11-11 LAB — CMP14+EGFR
A/G RATIO: 1.7 (ref 1.1–2.5)
ALT: 29 IU/L (ref 0–32)
AST: 41 IU/L — AB (ref 0–40)
Albumin: 4 g/dL (ref 3.5–4.8)
Alkaline Phosphatase: 46 IU/L (ref 39–117)
BUN/Creatinine Ratio: 26 (ref 11–26)
BUN: 74 mg/dL — ABNORMAL HIGH (ref 8–27)
CALCIUM: 9.7 mg/dL (ref 8.7–10.3)
CHLORIDE: 97 mmol/L (ref 97–108)
CO2: 22 mmol/L (ref 18–29)
Creatinine, Ser: 2.87 mg/dL — ABNORMAL HIGH (ref 0.57–1.00)
GFR calc Af Amer: 18 mL/min/{1.73_m2} — ABNORMAL LOW (ref >59)
GFR, EST NON AFRICAN AMERICAN: 15 mL/min/{1.73_m2} — AB (ref >59)
GLUCOSE: 211 mg/dL — AB (ref 65–99)
Globulin, Total: 2.3 g/dL (ref 1.5–4.5)
Potassium: 5.3 mmol/L — ABNORMAL HIGH (ref 3.5–5.2)
SODIUM: 140 mmol/L (ref 134–144)
TOTAL PROTEIN: 6.3 g/dL (ref 6.0–8.5)
Total Bilirubin: 0.4 mg/dL (ref 0.0–1.2)

## 2013-11-11 LAB — NMR, LIPOPROFILE
Cholesterol: 137 mg/dL (ref 100–199)
HDL Cholesterol by NMR: 17 mg/dL — ABNORMAL LOW (ref >39)
HDL Particle Number: 22.7 umol/L — ABNORMAL LOW (ref >=30.5)
LDL PARTICLE NUMBER: 1092 nmol/L — AB (ref <1000)
LDL Size: 19.6 nm (ref >20.5)
LDLC SERPL CALC-MCNC: 43 mg/dL (ref 0–99)
LP-IR SCORE: 74 — AB (ref <=45)
SMALL LDL PARTICLE NUMBER: 936 nmol/L — AB (ref <=527)
Triglycerides by NMR: 387 mg/dL — ABNORMAL HIGH (ref 0–149)

## 2013-11-13 ENCOUNTER — Telehealth: Payer: Self-pay | Admitting: *Deleted

## 2013-11-13 NOTE — Telephone Encounter (Signed)
Lm to call back asap

## 2013-11-14 ENCOUNTER — Other Ambulatory Visit: Payer: Self-pay | Admitting: Nurse Practitioner

## 2013-11-20 ENCOUNTER — Encounter: Payer: Self-pay | Admitting: *Deleted

## 2013-11-25 ENCOUNTER — Telehealth: Payer: Self-pay | Admitting: Nurse Practitioner

## 2013-11-25 DIAGNOSIS — R748 Abnormal levels of other serum enzymes: Secondary | ICD-10-CM

## 2013-11-25 NOTE — Telephone Encounter (Signed)
Patients daughter wants you to go ahead and set up referral for nephrologist per labs

## 2013-11-25 NOTE — Telephone Encounter (Signed)
Referral made 

## 2013-12-08 ENCOUNTER — Telehealth: Payer: Self-pay | Admitting: Nurse Practitioner

## 2013-12-08 DIAGNOSIS — G894 Chronic pain syndrome: Secondary | ICD-10-CM

## 2013-12-08 MED ORDER — OXYCODONE-ACETAMINOPHEN 10-325 MG PO TABS: 1.0000 | ORAL_TABLET | Freq: Two times a day (BID) | ORAL | Status: DC

## 2013-12-08 NOTE — Telephone Encounter (Signed)
rx ready for pickup 

## 2013-12-09 NOTE — Telephone Encounter (Signed)
Patient aware.

## 2013-12-15 ENCOUNTER — Encounter: Payer: Self-pay | Admitting: Nurse Practitioner

## 2013-12-15 ENCOUNTER — Ambulatory Visit (INDEPENDENT_AMBULATORY_CARE_PROVIDER_SITE_OTHER): Payer: Medicare Other | Admitting: Nurse Practitioner

## 2013-12-15 VITALS — BP 125/58 | HR 69 | Temp 97.8°F | Ht 62.0 in | Wt 245.0 lb

## 2013-12-15 DIAGNOSIS — L03039 Cellulitis of unspecified toe: Secondary | ICD-10-CM

## 2013-12-15 DIAGNOSIS — L03031 Cellulitis of right toe: Secondary | ICD-10-CM

## 2013-12-15 DIAGNOSIS — L02619 Cutaneous abscess of unspecified foot: Secondary | ICD-10-CM

## 2013-12-15 MED ORDER — CIPROFLOXACIN HCL 500 MG PO TABS: 500.0000 mg | ORAL_TABLET | Freq: Two times a day (BID) | ORAL | Status: DC

## 2013-12-15 NOTE — Patient Instructions (Signed)

## 2013-12-15 NOTE — Progress Notes (Signed)
   Subjective:    Patient ID: Anna [name], female    DOB: 1934-11-03, 79 y.o.   MRN: [id number]  HPI Patient brought in by daughter c/o toes on right foot red and swollen. Have been that way since last visit    Review of Systems  Constitutional: Negative.   HENT: Negative.   Respiratory: Negative.   Cardiovascular: Negative.   Genitourinary: Negative.   Neurological: Negative.   Psychiatric/Behavioral: Negative.   All other systems reviewed and are negative.      Objective:   Physical Exam  Constitutional: She appears well-developed and well-nourished.  Cardiovascular: Normal rate, regular rhythm and normal heart sounds.   Pulmonary/Chest: Effort normal and breath sounds normal.  Skin:  2nd, 3rd and 4th toe on right foot erythematous and swollen with a sore spot on top of 3rd toe. Palpable pedal pulse with brisk capillary refill all toes    BP 125/58  Pulse 69  Temp(Src) 97.8 F (36.6 C) (Oral)  Ht 5\' 2"  (1.575 m)  Wt 245 lb (111.131 kg)  BMI 44.80 kg/m2       Assessment & Plan:   1. Cellulitis of middle toe, right   2. Cellulitis of fourth toe, right   3. Cellulitis of small toe of right foot    Meds ordered this encounter  Medications  . ciprofloxacin (CIPRO) 500 MG tablet    Sig: Take 1 tablet (500 mg total) by mouth 2 (two) times daily.    Dispense:  20 tablet    Refill:  0    Order Specific Question:  Supervising Provider    Answer:  Chipper Herb [[id number]]   Soak foot in epsom salt BID Do not pick or scratch Wear shoes that do not put pressure on tops of toes RTO in 7 days recheck  Mary-Margaret Hassell Done, FNP

## 2013-12-16 ENCOUNTER — Telehealth: Payer: Self-pay | Admitting: *Deleted

## 2013-12-16 NOTE — Telephone Encounter (Signed)
Opened in error

## 2013-12-22 ENCOUNTER — Telehealth: Payer: Self-pay | Admitting: Nurse Practitioner

## 2013-12-22 NOTE — Telephone Encounter (Signed)
If doing better don't need to see her right now.

## 2013-12-23 NOTE — Telephone Encounter (Signed)
Pt.notified

## 2013-12-25 ENCOUNTER — Telehealth: Payer: Self-pay | Admitting: Nurse Practitioner

## 2013-12-25 DIAGNOSIS — R748 Abnormal levels of other serum enzymes: Secondary | ICD-10-CM

## 2013-12-25 MED ORDER — AMLODIPINE BESYLATE 5 MG PO TABS: 5.0000 mg | ORAL_TABLET | Freq: Every day | ORAL | Status: DC

## 2013-12-25 NOTE — Telephone Encounter (Signed)
Spoke with Rip Harbour- stopped benazepril and added amlodipine- they will bring patient in in 2 weeks to recheck creatine

## 2013-12-30 ENCOUNTER — Telehealth: Payer: Self-pay | Admitting: Nurse Practitioner

## 2013-12-30 NOTE — Telephone Encounter (Signed)
Spoke with pt daughter , Rip Harbour. Right foot no better and now left foot is red, swollen. Wants to schedule pt with diabetic specialist because of feet. Pt is soaking her feet and has finished antibiotic. She wants you to call her.

## 2013-12-30 NOTE — Telephone Encounter (Signed)
Please find out what she needs.

## 2014-01-01 ENCOUNTER — Telehealth: Payer: Self-pay | Admitting: Nurse Practitioner

## 2014-01-01 NOTE — Telephone Encounter (Signed)
Appt scheduled. Daughter aware.

## 2014-01-01 NOTE — Telephone Encounter (Signed)
Daughter spoke with Shelah Lewandowsky this morning and would like to see her on 01/08/14 when she is due for labs. Appt scheduled. Daughter aware. She will f/u sooner if condition worsens.

## 2014-01-01 NOTE — Telephone Encounter (Signed)
Patient told does not need referral to see foot specialist- was told to take mom to see dr.Drake.

## 2014-01-08 ENCOUNTER — Encounter: Payer: Self-pay | Admitting: Nurse Practitioner

## 2014-01-08 ENCOUNTER — Ambulatory Visit (INDEPENDENT_AMBULATORY_CARE_PROVIDER_SITE_OTHER): Payer: Medicare Other | Admitting: Nurse Practitioner

## 2014-01-08 VITALS — BP 111/57 | HR 68 | Temp 99.0°F | Ht 62.0 in | Wt 240.0 lb

## 2014-01-08 DIAGNOSIS — G894 Chronic pain syndrome: Secondary | ICD-10-CM

## 2014-01-08 DIAGNOSIS — Z794 Long term (current) use of insulin: Secondary | ICD-10-CM

## 2014-01-08 DIAGNOSIS — E119 Type 2 diabetes mellitus without complications: Secondary | ICD-10-CM

## 2014-01-08 DIAGNOSIS — R748 Abnormal levels of other serum enzymes: Secondary | ICD-10-CM

## 2014-01-08 DIAGNOSIS — R609 Edema, unspecified: Secondary | ICD-10-CM

## 2014-01-08 MED ORDER — OXYCODONE-ACETAMINOPHEN 10-325 MG PO TABS: 1.0000 | ORAL_TABLET | Freq: Two times a day (BID) | ORAL | Status: DC

## 2014-01-08 NOTE — Patient Instructions (Signed)
Peripheral Edema °You have swelling in your legs (peripheral edema). This swelling is due to excess accumulation of salt and water in your body. Edema may be a sign of heart, kidney or liver disease, or a side effect of a medication. It may also be due to problems in the leg veins. Elevating your legs and using special support stockings may be very helpful, if the cause of the swelling is due to poor venous circulation. Avoid long periods of standing, whatever the cause. °Treatment of edema depends on identifying the cause. Chips, pretzels, pickles and other salty foods should be avoided. Restricting salt in your diet is almost always needed. Water pills (diuretics) are often used to remove the excess salt and water from your body via urine. These medicines prevent the kidney from reabsorbing sodium. This increases urine flow. °Diuretic treatment may also result in lowering of potassium levels in your body. Potassium supplements may be needed if you have to use diuretics daily. Daily weights can help you keep track of your progress in clearing your edema. You should call your caregiver for follow up care as recommended. °SEEK IMMEDIATE MEDICAL CARE IF:  °· You have increased swelling, pain, redness, or heat in your legs. °· You develop shortness of breath, especially when lying down. °· You develop chest or abdominal pain, weakness, or fainting. °· You have a fever. °Document Released: 05/25/2004 Document Revised: 07/10/2011 Document Reviewed: 05/05/2009 °[employer]® Patient Information ©2015 [employer]. This information is not intended to replace advice given to you by your health care provider. Make sure you discuss any questions you have with your health care provider. ° °

## 2014-01-08 NOTE — Progress Notes (Signed)
   Subjective:    Patient ID: Anna [name], female    DOB: 09/28/1934, 79 y.o.   MRN: [id number]  HPI Patient is in today for recheck of swelling and cellulitis of bil feet. She has been soaking them and taking antibiotic. The swelling has improved and the reddness has resolved- We need to recheck her creatine today- She has a referral in place with nephrologist but will be several months before can be seen.    Review of Systems  Constitutional: Negative.   HENT: Negative.   Respiratory: Negative.   Cardiovascular: Negative.   Genitourinary: Negative.   Neurological: Negative.   Psychiatric/Behavioral: Negative.   All other systems reviewed and are negative.      Objective:   Physical Exam  Constitutional: She is oriented to person, place, and time. She appears well-developed and well-nourished.  Cardiovascular: Normal rate, regular rhythm and normal heart sounds.   Pulmonary/Chest: Effort normal and breath sounds normal.  Neurological: She is alert and oriented to person, place, and time.  Skin: Skin is warm and dry.  Psychiatric: She has a normal mood and affect. Her behavior is normal. Judgment and thought content normal.   BP 111/57  Pulse 68  Temp(Src) 99 F (37.2 C) (Oral)  Ht 5\' 2"  (1.575 m)  Wt 240 lb (108.863 kg)  BMI 43.89 kg/m2         Assessment & Plan:   1. Peripheral edema   2. Elevated creatine kinase   3. Chronic pain syndrome    Elevate legs when sitting Continue lasix as rx RTO in 1 month for regular follow up  [place], FNP

## 2014-01-09 LAB — NMR, LIPOPROFILE
CHOLESTEROL: 131 mg/dL (ref 100–199)
HDL Cholesterol by NMR: 20 mg/dL — ABNORMAL LOW (ref >39)
HDL Particle Number: 25.4 umol/L — ABNORMAL LOW (ref >=30.5)
LDL Particle Number: 1182 nmol/L — ABNORMAL HIGH (ref <1000)
LDL SIZE: 19.6 nm (ref >20.5)
LDLC SERPL CALC-MCNC: 43 mg/dL (ref 0–99)
LP-IR Score: 77 — ABNORMAL HIGH (ref <=45)
Small LDL Particle Number: 1037 nmol/L — ABNORMAL HIGH (ref <=527)
TRIGLYCERIDES BY NMR: 339 mg/dL — AB (ref 0–149)

## 2014-01-09 LAB — BASIC METABOLIC PANEL
BUN/Creatinine Ratio: 23 (ref 11–26)
BUN: 48 mg/dL — ABNORMAL HIGH (ref 8–27)
CHLORIDE: 100 mmol/L (ref 97–108)
CO2: 23 mmol/L (ref 18–29)
Calcium: 9.6 mg/dL (ref 8.7–10.3)
Creatinine, Ser: 2.05 mg/dL — ABNORMAL HIGH (ref 0.57–1.00)
GFR calc non Af Amer: 23 mL/min/{1.73_m2} — ABNORMAL LOW (ref >59)
GFR, EST AFRICAN AMERICAN: 26 mL/min/{1.73_m2} — AB (ref >59)
Glucose: 169 mg/dL — ABNORMAL HIGH (ref 65–99)
POTASSIUM: 4.7 mmol/L (ref 3.5–5.2)
Sodium: 142 mmol/L (ref 134–144)

## 2014-01-10 LAB — CMP14+EGFR
ALT: 23 [IU]/L (ref 0–32)
AST: 45 [IU]/L — ABNORMAL HIGH (ref 0–40)
Albumin/Globulin Ratio: 1.8 (ref 1.1–2.5)
Albumin: 4 g/dL (ref 3.5–4.8)
Alkaline Phosphatase: 44 [IU]/L (ref 39–117)
BUN/Creatinine Ratio: 23 (ref 11–26)
BUN: 48 mg/dL — ABNORMAL HIGH (ref 8–27)
CO2: 23 mmol/L (ref 18–29)
Calcium: 9.6 mg/dL (ref 8.7–10.3)
Chloride: 100 mmol/L (ref 97–108)
Creatinine, Ser: 2.05 mg/dL — ABNORMAL HIGH (ref 0.57–1.00)
GFR calc Af Amer: 26 mL/min/{1.73_m2} — ABNORMAL LOW
GFR calc non Af Amer: 23 mL/min/{1.73_m2} — ABNORMAL LOW
Globulin, Total: 2.2 g/dL (ref 1.5–4.5)
Glucose: 169 mg/dL — ABNORMAL HIGH (ref 65–99)
Potassium: 4.7 mmol/L (ref 3.5–5.2)
Sodium: 142 mmol/L (ref 134–144)
Total Bilirubin: 0.2 mg/dL (ref 0.0–1.2)
Total Protein: 6.2 g/dL (ref 6.0–8.5)

## 2014-01-17 ENCOUNTER — Other Ambulatory Visit: Payer: Self-pay | Admitting: Nurse Practitioner

## 2014-01-19 ENCOUNTER — Telehealth: Payer: Self-pay | Admitting: Nurse Practitioner

## 2014-01-19 ENCOUNTER — Telehealth: Payer: Self-pay | Admitting: Family Medicine

## 2014-01-19 MED ORDER — AMLODIPINE BESYLATE 5 MG PO TABS: 5.0000 mg | ORAL_TABLET | Freq: Every day | ORAL | Status: DC

## 2014-01-19 NOTE — Telephone Encounter (Signed)
done

## 2014-01-19 NOTE — Telephone Encounter (Signed)
appt scheduled for 10/5

## 2014-01-30 ENCOUNTER — Telehealth: Payer: Self-pay | Admitting: Nurse Practitioner

## 2014-01-30 NOTE — Telephone Encounter (Signed)
Patients daughter aware

## 2014-02-02 ENCOUNTER — Other Ambulatory Visit: Payer: Self-pay | Admitting: Nurse Practitioner

## 2014-02-02 ENCOUNTER — Encounter: Payer: Self-pay | Admitting: Nurse Practitioner

## 2014-02-02 ENCOUNTER — Ambulatory Visit (INDEPENDENT_AMBULATORY_CARE_PROVIDER_SITE_OTHER): Payer: Medicare Other | Admitting: Nurse Practitioner

## 2014-02-02 VITALS — BP 107/50 | HR 65 | Temp 98.0°F | Ht 62.0 in | Wt 246.0 lb

## 2014-02-02 DIAGNOSIS — G894 Chronic pain syndrome: Secondary | ICD-10-CM

## 2014-02-02 DIAGNOSIS — K219 Gastro-esophageal reflux disease without esophagitis: Secondary | ICD-10-CM

## 2014-02-02 DIAGNOSIS — Z23 Encounter for immunization: Secondary | ICD-10-CM

## 2014-02-02 DIAGNOSIS — E0843 Diabetes mellitus due to underlying condition with diabetic autonomic (poly)neuropathy: Secondary | ICD-10-CM

## 2014-02-02 DIAGNOSIS — J410 Simple chronic bronchitis: Secondary | ICD-10-CM

## 2014-02-02 DIAGNOSIS — I159 Secondary hypertension, unspecified: Secondary | ICD-10-CM

## 2014-02-02 DIAGNOSIS — E119 Type 2 diabetes mellitus without complications: Secondary | ICD-10-CM

## 2014-02-02 DIAGNOSIS — E669 Obesity, unspecified: Secondary | ICD-10-CM

## 2014-02-02 DIAGNOSIS — E785 Hyperlipidemia, unspecified: Secondary | ICD-10-CM

## 2014-02-02 DIAGNOSIS — Z794 Long term (current) use of insulin: Secondary | ICD-10-CM

## 2014-02-02 LAB — POCT GLYCOSYLATED HEMOGLOBIN (HGB A1C): Hemoglobin A1C: 6.6

## 2014-02-02 MED ORDER — OXYCODONE-ACETAMINOPHEN 10-325 MG PO TABS: 1.0000 | ORAL_TABLET | Freq: Two times a day (BID) | ORAL | Status: DC

## 2014-02-02 NOTE — Progress Notes (Signed)
Subjective:    Patient ID: Anna [name], female    DOB: 02-Mar-1935, 79 y.o.   MRN: [id number]  Patient here today for follow up of chronic medical problems.   Hypertension This is a chronic problem. The current episode started more than 1 year ago. The problem has been waxing and waning since onset. The problem is uncontrolled. Associated symptoms include peripheral edema and shortness of breath. Pertinent negatives include no anxiety, blurred vision, chest pain or palpitations. Risk factors for coronary artery disease include diabetes mellitus, dyslipidemia, family history, post-menopausal state and obesity. Past treatments include ACE inhibitors and alpha 1 blockers. The current treatment provides moderate improvement. Compliance problems include exercise.  There is no history of CAD/MI, heart failure or a thyroid problem. There is no history of sleep apnea.  Hyperlipidemia This is a chronic problem. The current episode started more than 1 year ago. The problem is controlled. Recent lipid tests were reviewed and are normal. Exacerbating diseases include diabetes and obesity. She has no history of liver disease. Associated symptoms include shortness of breath. Pertinent negatives include no chest pain, leg pain or myalgias. Current antihyperlipidemic treatment includes statins. The current treatment provides moderate improvement of lipids. Compliance problems include adherence to exercise and adherence to diet.  Risk factors for coronary artery disease include diabetes mellitus, dyslipidemia, family history, hypertension, obesity, a sedentary lifestyle and post-menopausal.  Diabetes She presents for her follow-up diabetic visit. She has type 2 diabetes mellitus. Hypoglycemia symptoms include dizziness. Associated symptoms include fatigue, foot paresthesias and weakness. Pertinent negatives for diabetes include no blurred vision, no chest pain and no visual change. There are no hypoglycemic  complications. Symptoms are stable. Diabetic complications include peripheral neuropathy. Risk factors for coronary artery disease include diabetes mellitus, dyslipidemia, family history, hypertension, obesity and post-menopausal. Current diabetic treatment includes oral agent (monotherapy) and insulin injections. She is compliant with treatment all of the time. Her weight is stable. She is following a generally healthy diet. She rarely participates in exercise. Her home blood glucose trend is fluctuating dramatically. Her breakfast blood glucose is taken between 8-9 am. Her breakfast blood glucose range is generally 140-180 mg/dl. An ACE inhibitor/angiotensin II receptor blocker is being taken. She does not see a podiatrist.Eye exam is current (has appointment next week).  Gastrophageal Reflux She reports no chest pain, no coughing, no heartburn or no nausea. The current episode started more than 1 year ago. The problem occurs rarely. The problem has been resolved. The symptoms are aggravated by certain foods. Associated symptoms include fatigue. Pertinent negatives include no melena. Risk factors include obesity. She has tried a PPI for the symptoms. The treatment provided significant relief.  Osteoarthritis  Pt takes percocet 1 tab BID for pain knee, and back pain Peripheral Edema Pt takes lasix daily-Pt still has edema in lower extremities. COPD Pt takes Symbicort daily-Pt feels like it is well controlled Peripheral Neuropathy Pt takes neurontin 300 mg TID-Pt does not fell like this is helping. Still  Has lots of nerve pain in her feet- seems to be worse at night.  Review of Systems  Constitutional: Positive for fatigue.  Eyes: Negative for blurred vision.  Respiratory: Positive for shortness of breath. Negative for cough.   Cardiovascular: Negative for chest pain and palpitations.  Gastrointestinal: Negative for heartburn, nausea and melena.  Musculoskeletal: Negative for myalgias.   Neurological: Positive for dizziness and weakness.  All other systems reviewed and are negative.      Objective:   Physical Exam  Vitals reviewed. Constitutional: She is oriented to person, place, and time. She appears well-developed and well-nourished.  HENT:  Head: Normocephalic.  Nose: Nose normal.  Mouth/Throat: Oropharynx is clear and moist.  Eyes: Pupils are equal, round, and reactive to light.  Neck: Normal range of motion. Neck supple.  Cardiovascular: Normal rate, regular rhythm and intact distal pulses.   Murmur heard. Pulmonary/Chest: Effort normal and breath sounds normal.  Abdominal: Soft. Bowel sounds are normal. She exhibits no distension. There is no tenderness.  Musculoskeletal: Normal range of motion. She exhibits edema (2+ in LE).  Neurological: She is alert and oriented to person, place, and time.  Skin: Skin is warm and dry.  Psychiatric: She has a normal mood and affect. Her behavior is normal. Judgment and thought content normal.     BP 107/50  Pulse 65  Temp(Src) 98 F (36.7 C) (Oral)  Ht _0  (1.575 m)  Wt 246 lb (111.585 kg)  BMI 44.98 kg/m2  Results for orders placed in visit on 02/02/14  POCT GLYCOSYLATED HEMOGLOBIN (HGB A1C)      Result Value Ref Range   Hemoglobin A1C 6.6         Assessment & Plan:   1. Diabetes mellitus type 2, insulin dependent - POCT glycosylated hemoglobin (Hb A1C)  2. Secondary hypertension, unspecified - BMP8+EGFR  3. Obesity Discussed diet and exercise for person with BMI >25 Will recheck weight in 3-6 months  4. Hyperlipidemia  5. Gastroesophageal reflux disease without esophagitis  6. Diabetic autonomic neuropathy associated with diabetes mellitus due to underlying condition  7. Simple chronic bronchitis  8. Chronic pain syndrome - oxyCODONE-acetaminophen (PERCOCET) 10-325 MG per tablet; Take 1 tablet by mouth 2 (two) times daily.  Dispense: 60 tablet; Refill: 0    Labs pending Health  maintenance reviewed Diet and exercise encouraged Continue all meds Follow up  In 3 months   [place], FNP

## 2014-02-02 NOTE — Patient Instructions (Signed)
Diabetes and Foot Care Diabetes may cause you to have problems because of poor blood supply (circulation) to your feet and legs. This may cause the skin on your feet to become thinner, break easier, and heal more slowly. Your skin may become dry, and the skin may peel and crack. You may also have nerve damage in your legs and feet causing decreased feeling in them. You may not notice minor injuries to your feet that could lead to infections or more serious problems. Taking care of your feet is one of the most important things you can do for yourself.  HOME CARE INSTRUCTIONS  Wear shoes at all times, even in the house. Do not go barefoot. Bare feet are easily injured.  Check your feet daily for blisters, cuts, and redness. If you cannot see the bottom of your feet, use a mirror or ask someone for help.  Wash your feet with warm water (do not use hot water) and mild soap. Then pat your feet and the areas between your toes until they are completely dry. Do not soak your feet as this can dry your skin.  Apply a moisturizing lotion or petroleum jelly (that does not contain alcohol and is unscented) to the skin on your feet and to dry, brittle toenails. Do not apply lotion between your toes.  Trim your toenails straight across. Do not dig under them or around the cuticle. File the edges of your nails with an emery board or nail file.  Do not cut corns or calluses or try to remove them with medicine.  Wear clean socks or stockings every day. Make sure they are not too tight. Do not wear knee-high stockings since they may decrease blood flow to your legs.  Wear shoes that fit properly and have enough cushioning. To break in new shoes, wear them for just a few hours a day. This prevents you from injuring your feet. Always look in your shoes before you put them on to be sure there are no objects inside.  Do not cross your legs. This may decrease the blood flow to your feet.  If you find a minor scrape,  cut, or break in the skin on your feet, keep it and the skin around it clean and dry. These areas may be cleansed with mild soap and water. Do not cleanse the area with peroxide, alcohol, or iodine.  When you remove an adhesive bandage, be sure not to damage the skin around it.  If you have a wound, look at it several times a day to make sure it is healing.  Do not use heating pads or hot water bottles. They may burn your skin. If you have lost feeling in your feet or legs, you may not know it is happening until it is too late.  Make sure your health care Astoria Condon performs a complete foot exam at least annually or more often if you have foot problems. Report any cuts, sores, or bruises to your health care Davey Bergsma immediately. SEEK MEDICAL CARE IF:   You have an injury that is not healing.  You have cuts or breaks in the skin.  You have an ingrown nail.  You notice redness on your legs or feet.  You feel burning or tingling in your legs or feet.  You have pain or cramps in your legs and feet.  Your legs or feet are numb.  Your feet always feel cold. SEEK IMMEDIATE MEDICAL CARE IF:   There is increasing redness,   swelling, or pain in or around a wound.  There is a red line that goes up your leg.  Pus is coming from a wound.  You develop a fever or as directed by your health care Adison Reifsteck.  You notice a bad smell coming from an ulcer or wound. Document Released: 04/14/2000 Document Revised: 12/18/2012 Document Reviewed: 09/24/2012 Exit[employer] Patient Information 2015 [employer]. This information is not intended to replace advice given to you by your health care [facility]. Make sure you discuss any questions you have with your health care Farid Grigorian.  

## 2014-02-03 LAB — BMP8+EGFR
BUN / CREAT RATIO: 23 (ref 11–26)
BUN: 43 mg/dL — ABNORMAL HIGH (ref 8–27)
CO2: 24 mmol/L (ref 18–29)
Calcium: 9.3 mg/dL (ref 8.7–10.3)
Chloride: 101 mmol/L (ref 97–108)
Creatinine, Ser: 1.91 mg/dL — ABNORMAL HIGH (ref 0.57–1.00)
GFR calc non Af Amer: 25 mL/min/{1.73_m2} — ABNORMAL LOW (ref >59)
GFR, EST AFRICAN AMERICAN: 29 mL/min/{1.73_m2} — AB (ref >59)
Glucose: 103 mg/dL — ABNORMAL HIGH (ref 65–99)
POTASSIUM: 4.8 mmol/L (ref 3.5–5.2)
Sodium: 142 mmol/L (ref 134–144)

## 2014-02-09 ENCOUNTER — Other Ambulatory Visit: Payer: Self-pay | Admitting: Nephrology

## 2014-02-09 DIAGNOSIS — N183 Chronic kidney disease, stage 3 (moderate): Secondary | ICD-10-CM

## 2014-02-17 ENCOUNTER — Telehealth: Payer: Self-pay | Admitting: Family Medicine

## 2014-02-17 ENCOUNTER — Ambulatory Visit
Admission: RE | Admit: 2014-02-17 | Discharge: 2014-02-17 | Disposition: A | Discharge status: Home / Self Care | Payer: Medicare Other | Source: Ambulatory Visit | Attending: Nephrology | Admitting: Nephrology

## 2014-02-17 DIAGNOSIS — N183 Chronic kidney disease, stage 3 (moderate): Secondary | ICD-10-CM

## 2014-02-17 NOTE — Telephone Encounter (Signed)
Form re-faxed

## 2014-02-25 LAB — HM DIABETES EYE EXAM

## 2014-03-03 ENCOUNTER — Encounter: Payer: Self-pay | Admitting: *Deleted

## 2014-03-04 ENCOUNTER — Other Ambulatory Visit: Payer: Self-pay | Admitting: Nurse Practitioner

## 2014-03-09 ENCOUNTER — Telehealth: Payer: Self-pay | Admitting: Nurse Practitioner

## 2014-03-09 DIAGNOSIS — G894 Chronic pain syndrome: Secondary | ICD-10-CM

## 2014-03-09 MED ORDER — OXYCODONE-ACETAMINOPHEN 10-325 MG PO TABS: 1.0000 | ORAL_TABLET | Freq: Two times a day (BID) | ORAL | Status: DC

## 2014-03-09 NOTE — Telephone Encounter (Signed)
rx ready for pickup 

## 2014-03-10 NOTE — Telephone Encounter (Signed)
Patient aware to pick up 

## 2014-03-11 NOTE — Addendum Note (Signed)
Addended by: Pollyann Kennedy F on: 03/11/2014 04:11 PM   Modules accepted: Orders

## 2014-03-12 ENCOUNTER — Other Ambulatory Visit: Payer: Medicare Other

## 2014-03-12 NOTE — Progress Notes (Signed)
LAB ONLY 

## 2014-03-13 LAB — MICROALBUMIN, URINE: MICROALBUM., U, RANDOM: 13.6 ug/mL (ref 0.0–17.0)

## 2014-03-25 ENCOUNTER — Other Ambulatory Visit: Payer: Self-pay | Admitting: Nurse Practitioner

## 2014-04-01 ENCOUNTER — Other Ambulatory Visit: Payer: Self-pay | Admitting: Nurse Practitioner

## 2014-04-07 ENCOUNTER — Other Ambulatory Visit: Payer: Self-pay | Admitting: Nurse Practitioner

## 2014-04-07 DIAGNOSIS — G894 Chronic pain syndrome: Secondary | ICD-10-CM

## 2014-04-08 MED ORDER — OXYCODONE-ACETAMINOPHEN 10-325 MG PO TABS: 1.0000 | ORAL_TABLET | Freq: Two times a day (BID) | ORAL | Status: DC

## 2014-04-08 NOTE — Telephone Encounter (Signed)
Percocet rx ready for pick up.

## 2014-04-08 NOTE — Telephone Encounter (Signed)
Jacqualin Combes, script for oxycodone ready.

## 2014-04-28 ENCOUNTER — Other Ambulatory Visit: Payer: Self-pay | Admitting: Nurse Practitioner

## 2014-04-28 ENCOUNTER — Other Ambulatory Visit: Payer: Self-pay | Admitting: Family Medicine

## 2014-05-01 DIAGNOSIS — E785 Hyperlipidemia, unspecified: Secondary | ICD-10-CM | POA: Diagnosis not present

## 2014-05-01 DIAGNOSIS — J449 Chronic obstructive pulmonary disease, unspecified: Secondary | ICD-10-CM | POA: Diagnosis not present

## 2014-05-01 DIAGNOSIS — F329 Major depressive disorder, single episode, unspecified: Secondary | ICD-10-CM | POA: Diagnosis not present

## 2014-05-02 DIAGNOSIS — F329 Major depressive disorder, single episode, unspecified: Secondary | ICD-10-CM | POA: Diagnosis not present

## 2014-05-02 DIAGNOSIS — J449 Chronic obstructive pulmonary disease, unspecified: Secondary | ICD-10-CM | POA: Diagnosis not present

## 2014-05-02 DIAGNOSIS — E785 Hyperlipidemia, unspecified: Secondary | ICD-10-CM | POA: Diagnosis not present

## 2014-05-03 DIAGNOSIS — F329 Major depressive disorder, single episode, unspecified: Secondary | ICD-10-CM | POA: Diagnosis not present

## 2014-05-03 DIAGNOSIS — J449 Chronic obstructive pulmonary disease, unspecified: Secondary | ICD-10-CM | POA: Diagnosis not present

## 2014-05-03 DIAGNOSIS — E785 Hyperlipidemia, unspecified: Secondary | ICD-10-CM | POA: Diagnosis not present

## 2014-05-04 DIAGNOSIS — E785 Hyperlipidemia, unspecified: Secondary | ICD-10-CM | POA: Diagnosis not present

## 2014-05-04 DIAGNOSIS — J449 Chronic obstructive pulmonary disease, unspecified: Secondary | ICD-10-CM | POA: Diagnosis not present

## 2014-05-04 DIAGNOSIS — F329 Major depressive disorder, single episode, unspecified: Secondary | ICD-10-CM | POA: Diagnosis not present

## 2014-05-05 DIAGNOSIS — F329 Major depressive disorder, single episode, unspecified: Secondary | ICD-10-CM | POA: Diagnosis not present

## 2014-05-05 DIAGNOSIS — J449 Chronic obstructive pulmonary disease, unspecified: Secondary | ICD-10-CM | POA: Diagnosis not present

## 2014-05-05 DIAGNOSIS — E785 Hyperlipidemia, unspecified: Secondary | ICD-10-CM | POA: Diagnosis not present

## 2014-05-06 DIAGNOSIS — F329 Major depressive disorder, single episode, unspecified: Secondary | ICD-10-CM | POA: Diagnosis not present

## 2014-05-06 DIAGNOSIS — J449 Chronic obstructive pulmonary disease, unspecified: Secondary | ICD-10-CM | POA: Diagnosis not present

## 2014-05-06 DIAGNOSIS — E785 Hyperlipidemia, unspecified: Secondary | ICD-10-CM | POA: Diagnosis not present

## 2014-05-07 DIAGNOSIS — F329 Major depressive disorder, single episode, unspecified: Secondary | ICD-10-CM | POA: Diagnosis not present

## 2014-05-07 DIAGNOSIS — J449 Chronic obstructive pulmonary disease, unspecified: Secondary | ICD-10-CM | POA: Diagnosis not present

## 2014-05-07 DIAGNOSIS — E785 Hyperlipidemia, unspecified: Secondary | ICD-10-CM | POA: Diagnosis not present

## 2014-05-08 DIAGNOSIS — E785 Hyperlipidemia, unspecified: Secondary | ICD-10-CM | POA: Diagnosis not present

## 2014-05-08 DIAGNOSIS — F329 Major depressive disorder, single episode, unspecified: Secondary | ICD-10-CM | POA: Diagnosis not present

## 2014-05-08 DIAGNOSIS — J449 Chronic obstructive pulmonary disease, unspecified: Secondary | ICD-10-CM | POA: Diagnosis not present

## 2014-05-09 DIAGNOSIS — J449 Chronic obstructive pulmonary disease, unspecified: Secondary | ICD-10-CM | POA: Diagnosis not present

## 2014-05-09 DIAGNOSIS — F329 Major depressive disorder, single episode, unspecified: Secondary | ICD-10-CM | POA: Diagnosis not present

## 2014-05-09 DIAGNOSIS — E785 Hyperlipidemia, unspecified: Secondary | ICD-10-CM | POA: Diagnosis not present

## 2014-05-10 DIAGNOSIS — J449 Chronic obstructive pulmonary disease, unspecified: Secondary | ICD-10-CM | POA: Diagnosis not present

## 2014-05-10 DIAGNOSIS — F329 Major depressive disorder, single episode, unspecified: Secondary | ICD-10-CM | POA: Diagnosis not present

## 2014-05-10 DIAGNOSIS — E785 Hyperlipidemia, unspecified: Secondary | ICD-10-CM | POA: Diagnosis not present

## 2014-05-11 ENCOUNTER — Other Ambulatory Visit: Payer: Self-pay | Admitting: Nurse Practitioner

## 2014-05-11 DIAGNOSIS — G894 Chronic pain syndrome: Secondary | ICD-10-CM

## 2014-05-11 DIAGNOSIS — E785 Hyperlipidemia, unspecified: Secondary | ICD-10-CM | POA: Diagnosis not present

## 2014-05-11 DIAGNOSIS — J449 Chronic obstructive pulmonary disease, unspecified: Secondary | ICD-10-CM | POA: Diagnosis not present

## 2014-05-11 DIAGNOSIS — F329 Major depressive disorder, single episode, unspecified: Secondary | ICD-10-CM | POA: Diagnosis not present

## 2014-05-12 DIAGNOSIS — E785 Hyperlipidemia, unspecified: Secondary | ICD-10-CM | POA: Diagnosis not present

## 2014-05-12 DIAGNOSIS — J449 Chronic obstructive pulmonary disease, unspecified: Secondary | ICD-10-CM | POA: Diagnosis not present

## 2014-05-12 DIAGNOSIS — F329 Major depressive disorder, single episode, unspecified: Secondary | ICD-10-CM | POA: Diagnosis not present

## 2014-05-12 MED ORDER — OXYCODONE-ACETAMINOPHEN 10-325 MG PO TABS: 1.0000 | ORAL_TABLET | Freq: Two times a day (BID) | ORAL | Status: DC

## 2014-05-12 NOTE — Telephone Encounter (Signed)
Pt notified Rx is ready for pick up RX to the front

## 2014-05-12 NOTE — Telephone Encounter (Signed)
Pain rx ready for pick up  

## 2014-05-13 DIAGNOSIS — E785 Hyperlipidemia, unspecified: Secondary | ICD-10-CM | POA: Diagnosis not present

## 2014-05-13 DIAGNOSIS — F329 Major depressive disorder, single episode, unspecified: Secondary | ICD-10-CM | POA: Diagnosis not present

## 2014-05-13 DIAGNOSIS — J449 Chronic obstructive pulmonary disease, unspecified: Secondary | ICD-10-CM | POA: Diagnosis not present

## 2014-05-14 DIAGNOSIS — F329 Major depressive disorder, single episode, unspecified: Secondary | ICD-10-CM | POA: Diagnosis not present

## 2014-05-14 DIAGNOSIS — J449 Chronic obstructive pulmonary disease, unspecified: Secondary | ICD-10-CM | POA: Diagnosis not present

## 2014-05-14 DIAGNOSIS — E785 Hyperlipidemia, unspecified: Secondary | ICD-10-CM | POA: Diagnosis not present

## 2014-05-15 DIAGNOSIS — J449 Chronic obstructive pulmonary disease, unspecified: Secondary | ICD-10-CM | POA: Diagnosis not present

## 2014-05-15 DIAGNOSIS — F329 Major depressive disorder, single episode, unspecified: Secondary | ICD-10-CM | POA: Diagnosis not present

## 2014-05-15 DIAGNOSIS — E785 Hyperlipidemia, unspecified: Secondary | ICD-10-CM | POA: Diagnosis not present

## 2014-05-16 DIAGNOSIS — F329 Major depressive disorder, single episode, unspecified: Secondary | ICD-10-CM | POA: Diagnosis not present

## 2014-05-16 DIAGNOSIS — E785 Hyperlipidemia, unspecified: Secondary | ICD-10-CM | POA: Diagnosis not present

## 2014-05-16 DIAGNOSIS — J449 Chronic obstructive pulmonary disease, unspecified: Secondary | ICD-10-CM | POA: Diagnosis not present

## 2014-05-17 DIAGNOSIS — J449 Chronic obstructive pulmonary disease, unspecified: Secondary | ICD-10-CM | POA: Diagnosis not present

## 2014-05-17 DIAGNOSIS — E785 Hyperlipidemia, unspecified: Secondary | ICD-10-CM | POA: Diagnosis not present

## 2014-05-17 DIAGNOSIS — F329 Major depressive disorder, single episode, unspecified: Secondary | ICD-10-CM | POA: Diagnosis not present

## 2014-05-18 DIAGNOSIS — E785 Hyperlipidemia, unspecified: Secondary | ICD-10-CM | POA: Diagnosis not present

## 2014-05-18 DIAGNOSIS — J449 Chronic obstructive pulmonary disease, unspecified: Secondary | ICD-10-CM | POA: Diagnosis not present

## 2014-05-18 DIAGNOSIS — F329 Major depressive disorder, single episode, unspecified: Secondary | ICD-10-CM | POA: Diagnosis not present

## 2014-05-19 ENCOUNTER — Other Ambulatory Visit: Payer: Self-pay | Admitting: Nurse Practitioner

## 2014-05-19 DIAGNOSIS — J449 Chronic obstructive pulmonary disease, unspecified: Secondary | ICD-10-CM | POA: Diagnosis not present

## 2014-05-19 DIAGNOSIS — F329 Major depressive disorder, single episode, unspecified: Secondary | ICD-10-CM | POA: Diagnosis not present

## 2014-05-19 DIAGNOSIS — E785 Hyperlipidemia, unspecified: Secondary | ICD-10-CM | POA: Diagnosis not present

## 2014-05-19 NOTE — Telephone Encounter (Signed)
Advised she would ntbs as she hasn't ever been dx'd with gout before and has a hx of cellulitis in her foot. Pt will keep appt with MMM declined earlier appt.

## 2014-05-20 DIAGNOSIS — F329 Major depressive disorder, single episode, unspecified: Secondary | ICD-10-CM | POA: Diagnosis not present

## 2014-05-20 DIAGNOSIS — E785 Hyperlipidemia, unspecified: Secondary | ICD-10-CM | POA: Diagnosis not present

## 2014-05-20 DIAGNOSIS — J449 Chronic obstructive pulmonary disease, unspecified: Secondary | ICD-10-CM | POA: Diagnosis not present

## 2014-05-21 ENCOUNTER — Ambulatory Visit (INDEPENDENT_AMBULATORY_CARE_PROVIDER_SITE_OTHER): Payer: Medicare Other | Admitting: Nurse Practitioner

## 2014-05-21 ENCOUNTER — Encounter: Payer: Self-pay | Admitting: Nurse Practitioner

## 2014-05-21 VITALS — BP 136/66 | HR 66 | Temp 97.4°F | Ht 62.0 in | Wt 246.0 lb

## 2014-05-21 DIAGNOSIS — N3000 Acute cystitis without hematuria: Secondary | ICD-10-CM | POA: Diagnosis not present

## 2014-05-21 DIAGNOSIS — J410 Simple chronic bronchitis: Secondary | ICD-10-CM

## 2014-05-21 DIAGNOSIS — R300 Dysuria: Secondary | ICD-10-CM

## 2014-05-21 DIAGNOSIS — K219 Gastro-esophageal reflux disease without esophagitis: Secondary | ICD-10-CM

## 2014-05-21 DIAGNOSIS — E669 Obesity, unspecified: Secondary | ICD-10-CM

## 2014-05-21 DIAGNOSIS — Z794 Long term (current) use of insulin: Secondary | ICD-10-CM

## 2014-05-21 DIAGNOSIS — E0843 Diabetes mellitus due to underlying condition with diabetic autonomic (poly)neuropathy: Secondary | ICD-10-CM

## 2014-05-21 DIAGNOSIS — I10 Essential (primary) hypertension: Secondary | ICD-10-CM

## 2014-05-21 DIAGNOSIS — J449 Chronic obstructive pulmonary disease, unspecified: Secondary | ICD-10-CM | POA: Diagnosis not present

## 2014-05-21 DIAGNOSIS — M79674 Pain in right toe(s): Secondary | ICD-10-CM

## 2014-05-21 DIAGNOSIS — R609 Edema, unspecified: Secondary | ICD-10-CM

## 2014-05-21 DIAGNOSIS — E785 Hyperlipidemia, unspecified: Secondary | ICD-10-CM

## 2014-05-21 DIAGNOSIS — F329 Major depressive disorder, single episode, unspecified: Secondary | ICD-10-CM

## 2014-05-21 DIAGNOSIS — F32A Depression, unspecified: Secondary | ICD-10-CM

## 2014-05-21 DIAGNOSIS — E119 Type 2 diabetes mellitus without complications: Secondary | ICD-10-CM

## 2014-05-21 LAB — POCT URINALYSIS DIPSTICK
Bilirubin, UA: NEGATIVE
Blood, UA: NEGATIVE
Glucose, UA: NEGATIVE
Ketones, UA: NEGATIVE
Nitrite, UA: POSITIVE
Protein, UA: NEGATIVE
Spec Grav, UA: 1.015
Urobilinogen, UA: NEGATIVE
pH, UA: 5

## 2014-05-21 LAB — POCT UA - MICROSCOPIC ONLY
CRYSTALS, UR, HPF, POC: NEGATIVE
Casts, Ur, LPF, POC: NEGATIVE
MUCUS UA: NEGATIVE
Yeast, UA: NEGATIVE

## 2014-05-21 LAB — POCT GLYCOSYLATED HEMOGLOBIN (HGB A1C): Hemoglobin A1C: 7.3

## 2014-05-21 MED ORDER — GABAPENTIN 300 MG PO CAPS: ORAL_CAPSULE | ORAL | Status: DC

## 2014-05-21 MED ORDER — INSULIN ASPART PROT & ASPART (70-30 MIX) 100 UNIT/ML PEN: 58.0000 [IU] | PEN_INJECTOR | Freq: Two times a day (BID) | SUBCUTANEOUS | Status: DC

## 2014-05-21 MED ORDER — BUDESONIDE-FORMOTEROL FUMARATE 80-4.5 MCG/ACT IN AERO: 1.0000 | INHALATION_SPRAY | Freq: Two times a day (BID) | RESPIRATORY_TRACT | Status: DC

## 2014-05-21 MED ORDER — ROSUVASTATIN CALCIUM 20 MG PO TABS: 20.0000 mg | ORAL_TABLET | Freq: Every day | ORAL | Status: DC

## 2014-05-21 MED ORDER — AMLODIPINE BESYLATE 5 MG PO TABS: 5.0000 mg | ORAL_TABLET | Freq: Every day | ORAL | Status: DC

## 2014-05-21 MED ORDER — CLONIDINE HCL 0.2 MG PO TABS: 0.2000 mg | ORAL_TABLET | Freq: Two times a day (BID) | ORAL | Status: DC

## 2014-05-21 MED ORDER — CARVEDILOL 25 MG PO TABS
ORAL_TABLET | ORAL | Status: DC
Start: 2014-05-21 — End: 2014-11-26

## 2014-05-21 MED ORDER — OMEPRAZOLE 40 MG PO CPDR: DELAYED_RELEASE_CAPSULE | ORAL | Status: DC

## 2014-05-21 MED ORDER — FUROSEMIDE 40 MG PO TABS: 40.0000 mg | ORAL_TABLET | Freq: Every day | ORAL | Status: DC

## 2014-05-21 MED ORDER — FENOFIBRATE 145 MG PO TABS: 145.0000 mg | ORAL_TABLET | Freq: Every day | ORAL | Status: DC

## 2014-05-21 MED ORDER — CIPROFLOXACIN HCL 500 MG PO TABS: 500.0000 mg | ORAL_TABLET | Freq: Two times a day (BID) | ORAL | Status: DC

## 2014-05-21 MED ORDER — CITALOPRAM HYDROBROMIDE 20 MG PO TABS: 20.0000 mg | ORAL_TABLET | Freq: Every day | ORAL | Status: DC

## 2014-05-21 MED ORDER — GLIMEPIRIDE 4 MG PO TABS: ORAL_TABLET | ORAL | Status: DC

## 2014-05-21 NOTE — Progress Notes (Signed)
Subjective:    Patient ID: Anna [name], female    DOB: [birth year], 80 y.o.   MRN: [id number]  Patient here today for follow up of chronic medical problems. C/o of pain in right big toe- hurts for anything  To touch it. Started 1 week ago.  Hypertension This is a chronic problem. The current episode started more than 1 year ago. The problem is controlled. Associated symptoms include shortness of breath. Pertinent negatives include no chest pain or palpitations. Risk factors for coronary artery disease include diabetes mellitus, dyslipidemia, obesity, post-menopausal state and sedentary lifestyle. Past treatments include beta blockers, central alpha agonists and calcium channel blockers. The current treatment provides moderate improvement. Compliance problems include diet and exercise.  Hypertensive end-organ damage includes CAD/MI.  Hyperlipidemia This is a chronic problem. The current episode started more than 1 year ago. The problem is uncontrolled. Recent lipid tests were reviewed and are normal. Exacerbating diseases include diabetes and obesity. She has no history of hypothyroidism. Associated symptoms include shortness of breath. Pertinent negatives include no chest pain or myalgias. Current antihyperlipidemic treatment includes statins and fibric acid derivatives. The current treatment provides moderate improvement of lipids. Compliance problems include adherence to diet and adherence to exercise.  Risk factors for coronary artery disease include diabetes mellitus, dyslipidemia, hypertension and post-menopausal.  Diabetes She presents for her follow-up diabetic visit. She has type 2 diabetes mellitus. No MedicAlert identification noted. Her disease course has been fluctuating. Hypoglycemia symptoms include dizziness. Associated symptoms include fatigue and weakness. Pertinent negatives for diabetes include no chest pain and no visual change. Diabetic complications include heart disease. Risk  factors for coronary artery disease include diabetes mellitus, dyslipidemia, hypertension and post-menopausal. Current diabetic treatment includes insulin injections and oral agent (monotherapy). She is compliant with treatment some of the time. Her weight is stable. When asked about meal planning, she reported none. She has not had a previous visit with a dietitian. She never participates in exercise. Her breakfast blood glucose is taken between 8-9 am. Her breakfast blood glucose range is generally 130-140 mg/dl. Her overall blood glucose range is 130-140 mg/dl. An ACE inhibitor/angiotensin II receptor blocker is not being taken. She does not see a podiatrist.Eye exam is not current.  Gastrophageal Reflux She reports no chest pain, no coughing or no nausea. Associated symptoms include fatigue.  Osteoarthritis  Pt takes percocet 1 tab BID for pain knee, and back pain Peripheral Edema Pt takes lasix daily-Pt still has edema in lower extremities. COPD Pt takes Symbicort daily-Pt feels like it is well controlled Peripheral Neuropathy Pt takes neurontin 300 mg TID-Pt does not fell like this is helping. Still  Has lots of nerve pain in her feet- seems to be worse at night.  Review of Systems  Constitutional: Positive for fatigue.  Respiratory: Positive for shortness of breath. Negative for cough.   Cardiovascular: Negative for chest pain and palpitations.  Gastrointestinal: Negative for nausea.  Musculoskeletal: Negative for myalgias.  Neurological: Positive for dizziness and weakness.  All other systems reviewed and are negative.      Objective:   Physical Exam  Constitutional: She is oriented to person, place, and time. She appears well-developed and well-nourished.  HENT:  Head: Normocephalic.  Nose: Nose normal.  Mouth/Throat: Oropharynx is clear and moist.  Eyes: Pupils are equal, round, and reactive to light.  Neck: Normal range of motion. Neck supple.  Cardiovascular: Normal rate,  regular rhythm and intact distal pulses.   Murmur heard. Pulmonary/Chest: Effort normal and  breath sounds normal.  Abdominal: Soft. Bowel sounds are normal. She exhibits no distension. There is no tenderness.  Musculoskeletal: Normal range of motion. She exhibits edema (2+ in LE).  Erythematous medial side of right great toe- very tender to touch.  Neurological: She is alert and oriented to person, place, and time.  Skin: Skin is warm and dry.  Psychiatric: She has a normal mood and affect. Her behavior is normal. Judgment and thought content normal.  Vitals reviewed.    BP 136/66 mmHg  Pulse 66  Temp(Src) 97.4 F (36.3 C) (Oral)  Ht _0  (1.575 m)  Wt 246 lb (111.585 kg)  BMI 44.98 kg/m2  Results for orders placed or performed in visit on 05/21/14  POCT glycosylated hemoglobin (Hb A1C)  Result Value Ref Range   Hemoglobin A1C 7.3%   POCT urinalysis dipstick  Result Value Ref Range   Color, UA yellow    Clarity, UA cloudy    Glucose, UA neg    Bilirubin, UA neg    Ketones, UA neg    Spec Grav, UA 1.015    Blood, UA neg    pH, UA 5.0    Protein, UA neg    Urobilinogen, UA negative    Nitrite, UA positive    Leukocytes, UA Trace   POCT UA - Microscopic Only  Result Value Ref Range   WBC, Ur, HPF, POC 15-20    RBC, urine, microscopic rare    Bacteria, U Microscopic many    Mucus, UA neg    Epithelial cells, urine per micros rare    Crystals, Ur, HPF, POC neg    Casts, Ur, LPF, POC neg    Yeast, UA neg         Assessment & Plan:    1. Hyperlipidemia Low fta diet - NMR, lipoprofile - rosuvastatin (CRESTOR) 20 MG tablet; Take 1 tablet (20 mg total) by mouth daily.  Dispense: 30 tablet; Refill: 5 - fenofibrate (TRICOR) 145 MG tablet; Take 1 tablet (145 mg total) by mouth daily.  Dispense: 30 tablet; Refill: 5  2. Essential hypertension Do  Not add salt to deit - CMP14+EGFR - amLODipine (NORVASC) 5 MG tablet; Take 1 tablet (5 mg total) by mouth daily.   Dispense: 30 tablet; Refill: 5 - carvedilol (COREG) 25 MG tablet; TAKE 1 TABLET BY MOUTH TWICE DAILY WITH A MEAL  Dispense: 60 tablet; Refill: 5 - cloNIDine (CATAPRES) 0.2 MG tablet; Take 1 tablet (0.2 mg total) by mouth 2 (two) times daily.  Dispense: 60 tablet; Refill: 5  3. Diabetes mellitus type 2, insulin dependent Continue to watch carbs in diet - POCT glycosylated hemoglobin (Hb A1C) - glimepiride (AMARYL) 4 MG tablet; TAKE 1 TABLET BY MOUTH EVERY DAY BEFORE BREAKFAST  Dispense: 30 tablet; Refill: 5 - insulin aspart protamine - aspart (NOVOLOG MIX 70/30 FLEXPEN) (70-30) 100 UNIT/ML FlexPen; Inject 0.58 mLs (58 Units total) into the skin 2 (two) times daily.  Dispense: 45 mL; Refill: 3  4. Dysuria  - POCT urinalysis dipstick - POCT UA - Microscopic Only  5. Simple chronic bronchitis - budesonide-formoterol (SYMBICORT) 80-4.5 MCG/ACT inhaler; Inhale 1 puff into the lungs 2 (two) times daily.  Dispense: 1 Inhaler; Refill: 5  6. Gastroesophageal reflux disease without esophagitis Avoid spicy foods - omeprazole (PRILOSEC) 40 MG capsule; TAKE 1 CAPSULE BY MOUTH EVERY DAY  Dispense: 30 capsule; Refill: 5  7. Diabetic autonomic neuropathy associated with diabetes mellitus due to underlying condition - gabapentin (NEURONTIN) 300 MG  capsule; TAKE 1 CAPSULE BY MOUTH 4 TIMES A DAY  Dispense: 120 capsule; Refill: 5  8. Obesity Discussed diet and exercise for person with BMI >25 Will recheck weight in 3-6 months   9. Depression Stress management - citalopram (CELEXA) 20 MG tablet; Take 1 tablet (20 mg total) by mouth daily.  Dispense: 30 tablet; Refill: 5  10. Peripheral edema Elevate legs when sitting - furosemide (LASIX) 40 MG tablet; Take 1 tablet (40 mg total) by mouth daily.  Dispense: 30 tablet; Refill: 5  11. Great toe pain, right Labs pending - Arthritis Panel  12. UTI -cipro 544m BID X 3 days Culture pending   Labs pending Health maintenance reviewed Diet and  exercise encouraged Continue all meds Follow up  In 3 months   [place] FNP

## 2014-05-21 NOTE — Patient Instructions (Signed)

## 2014-05-22 LAB — URINE CULTURE: Organism ID, Bacteria: NO GROWTH

## 2014-05-22 LAB — ARTHRITIS PANEL
Basophils Absolute: 0 10*3/uL (ref 0.0–0.2)
Basos: 1 %
Eos: 1 %
Eosinophils Absolute: 0.1 10*3/uL (ref 0.0–0.4)
HCT: 30.9 % — ABNORMAL LOW (ref 34.0–46.6)
Hemoglobin: 9.7 g/dL — ABNORMAL LOW (ref 11.1–15.9)
IMMATURE GRANS (ABS): 0 10*3/uL (ref 0.0–0.1)
IMMATURE GRANULOCYTES: 0 %
Lymphocytes Absolute: 1.1 10*3/uL (ref 0.7–3.1)
Lymphs: 17 %
MCH: 26.7 pg (ref 26.6–33.0)
MCHC: 31.4 g/dL — AB (ref 31.5–35.7)
MCV: 85 fL (ref 79–97)
MONOCYTES: 8 %
Monocytes Absolute: 0.5 10*3/uL (ref 0.1–0.9)
NEUTROS PCT: 73 %
Neutrophils Absolute: 4.5 10*3/uL (ref 1.4–7.0)
Platelets: 282 10*3/uL (ref 150–379)
RBC: 3.63 x10E6/uL — ABNORMAL LOW (ref 3.77–5.28)
RDW: 15.2 % (ref 12.3–15.4)
Rhuematoid fact SerPl-aCnc: 8.7 IU/mL (ref 0.0–13.9)
Sed Rate: 34 mm/hr (ref 0–40)
Uric Acid: 11.7 mg/dL — ABNORMAL HIGH (ref 2.5–7.1)
WBC: 6.2 10*3/uL (ref 3.4–10.8)

## 2014-05-22 LAB — NMR, LIPOPROFILE
CHOLESTEROL: 135 mg/dL (ref 100–199)
HDL Cholesterol by NMR: 19 mg/dL — ABNORMAL LOW (ref >39)
HDL PARTICLE NUMBER: 22.6 umol/L — AB (ref >=30.5)
LDL PARTICLE NUMBER: 1348 nmol/L — AB (ref <1000)
LDL SIZE: 19.7 nm (ref >20.5)
LDL-C: 57 mg/dL (ref 0–99)
LP-IR Score: 80 — ABNORMAL HIGH (ref <=45)
SMALL LDL PARTICLE NUMBER: 1164 nmol/L — AB (ref <=527)
TRIGLYCERIDES BY NMR: 295 mg/dL — AB (ref 0–149)

## 2014-05-22 LAB — CMP14+EGFR
ALT: 24 IU/L (ref 0–32)
AST: 73 IU/L — ABNORMAL HIGH (ref 0–40)
Albumin/Globulin Ratio: 1.6 (ref 1.1–2.5)
Albumin: 4.1 g/dL (ref 3.5–4.8)
Alkaline Phosphatase: 39 IU/L (ref 39–117)
BUN/Creatinine Ratio: 25 (ref 11–26)
BUN: 49 mg/dL — ABNORMAL HIGH (ref 8–27)
CHLORIDE: 100 mmol/L (ref 97–108)
CO2: 23 mmol/L (ref 18–29)
Calcium: 9.8 mg/dL (ref 8.7–10.3)
Creatinine, Ser: 2 mg/dL — ABNORMAL HIGH (ref 0.57–1.00)
GFR, EST AFRICAN AMERICAN: 27 mL/min/{1.73_m2} — AB (ref >59)
GFR, EST NON AFRICAN AMERICAN: 24 mL/min/{1.73_m2} — AB (ref >59)
GLUCOSE: 55 mg/dL — AB (ref 65–99)
Globulin, Total: 2.5 g/dL (ref 1.5–4.5)
POTASSIUM: 4.6 mmol/L (ref 3.5–5.2)
Sodium: 143 mmol/L (ref 134–144)
TOTAL PROTEIN: 6.6 g/dL (ref 6.0–8.5)
Total Bilirubin: 0.4 mg/dL (ref 0.0–1.2)

## 2014-05-24 DIAGNOSIS — J449 Chronic obstructive pulmonary disease, unspecified: Secondary | ICD-10-CM | POA: Diagnosis not present

## 2014-05-24 DIAGNOSIS — F329 Major depressive disorder, single episode, unspecified: Secondary | ICD-10-CM | POA: Diagnosis not present

## 2014-05-24 DIAGNOSIS — E785 Hyperlipidemia, unspecified: Secondary | ICD-10-CM | POA: Diagnosis not present

## 2014-05-25 ENCOUNTER — Other Ambulatory Visit: Payer: Self-pay | Admitting: Nurse Practitioner

## 2014-05-25 ENCOUNTER — Telehealth: Payer: Self-pay | Admitting: Nurse Practitioner

## 2014-05-25 DIAGNOSIS — J449 Chronic obstructive pulmonary disease, unspecified: Secondary | ICD-10-CM | POA: Diagnosis not present

## 2014-05-25 DIAGNOSIS — E785 Hyperlipidemia, unspecified: Secondary | ICD-10-CM | POA: Diagnosis not present

## 2014-05-25 DIAGNOSIS — F329 Major depressive disorder, single episode, unspecified: Secondary | ICD-10-CM | POA: Diagnosis not present

## 2014-05-25 MED ORDER — FEBUXOSTAT 40 MG PO TABS: 40.0000 mg | ORAL_TABLET | Freq: Every day | ORAL | Status: DC

## 2014-05-25 NOTE — Telephone Encounter (Signed)
Patient aware rx sent over to pharmacy

## 2014-05-25 NOTE — Telephone Encounter (Signed)
I was waiting on labs

## 2014-05-25 NOTE — Telephone Encounter (Signed)
uloric rx sent to pharmacy

## 2014-05-26 DIAGNOSIS — E785 Hyperlipidemia, unspecified: Secondary | ICD-10-CM | POA: Diagnosis not present

## 2014-05-26 DIAGNOSIS — J449 Chronic obstructive pulmonary disease, unspecified: Secondary | ICD-10-CM | POA: Diagnosis not present

## 2014-05-26 DIAGNOSIS — F329 Major depressive disorder, single episode, unspecified: Secondary | ICD-10-CM | POA: Diagnosis not present

## 2014-05-27 ENCOUNTER — Telehealth: Payer: Self-pay | Admitting: Nurse Practitioner

## 2014-05-27 ENCOUNTER — Other Ambulatory Visit: Payer: Self-pay | Admitting: Nurse Practitioner

## 2014-05-27 DIAGNOSIS — E785 Hyperlipidemia, unspecified: Secondary | ICD-10-CM | POA: Diagnosis not present

## 2014-05-27 DIAGNOSIS — J449 Chronic obstructive pulmonary disease, unspecified: Secondary | ICD-10-CM | POA: Diagnosis not present

## 2014-05-27 DIAGNOSIS — F329 Major depressive disorder, single episode, unspecified: Secondary | ICD-10-CM | POA: Diagnosis not present

## 2014-05-27 NOTE — Telephone Encounter (Signed)
Just use OTC mucinex

## 2014-05-27 NOTE — Telephone Encounter (Signed)
Patient daughter aware. °

## 2014-05-27 NOTE — Telephone Encounter (Signed)
Patient aware.

## 2014-05-27 NOTE — Telephone Encounter (Signed)
Does not need one if not running a fever

## 2014-05-28 DIAGNOSIS — F329 Major depressive disorder, single episode, unspecified: Secondary | ICD-10-CM | POA: Diagnosis not present

## 2014-05-28 DIAGNOSIS — E785 Hyperlipidemia, unspecified: Secondary | ICD-10-CM | POA: Diagnosis not present

## 2014-05-28 DIAGNOSIS — J449 Chronic obstructive pulmonary disease, unspecified: Secondary | ICD-10-CM | POA: Diagnosis not present

## 2014-05-29 DIAGNOSIS — E785 Hyperlipidemia, unspecified: Secondary | ICD-10-CM | POA: Diagnosis not present

## 2014-05-29 DIAGNOSIS — J449 Chronic obstructive pulmonary disease, unspecified: Secondary | ICD-10-CM | POA: Diagnosis not present

## 2014-05-29 DIAGNOSIS — F329 Major depressive disorder, single episode, unspecified: Secondary | ICD-10-CM | POA: Diagnosis not present

## 2014-05-30 DIAGNOSIS — J449 Chronic obstructive pulmonary disease, unspecified: Secondary | ICD-10-CM | POA: Diagnosis not present

## 2014-05-30 DIAGNOSIS — F329 Major depressive disorder, single episode, unspecified: Secondary | ICD-10-CM | POA: Diagnosis not present

## 2014-05-30 DIAGNOSIS — E785 Hyperlipidemia, unspecified: Secondary | ICD-10-CM | POA: Diagnosis not present

## 2014-05-31 DIAGNOSIS — F329 Major depressive disorder, single episode, unspecified: Secondary | ICD-10-CM | POA: Diagnosis not present

## 2014-05-31 DIAGNOSIS — J449 Chronic obstructive pulmonary disease, unspecified: Secondary | ICD-10-CM | POA: Diagnosis not present

## 2014-05-31 DIAGNOSIS — E785 Hyperlipidemia, unspecified: Secondary | ICD-10-CM | POA: Diagnosis not present

## 2014-06-01 ENCOUNTER — Telehealth: Payer: Self-pay | Admitting: *Deleted

## 2014-06-01 DIAGNOSIS — E785 Hyperlipidemia, unspecified: Secondary | ICD-10-CM | POA: Diagnosis not present

## 2014-06-01 DIAGNOSIS — F329 Major depressive disorder, single episode, unspecified: Secondary | ICD-10-CM | POA: Diagnosis not present

## 2014-06-01 DIAGNOSIS — J449 Chronic obstructive pulmonary disease, unspecified: Secondary | ICD-10-CM | POA: Diagnosis not present

## 2014-06-01 NOTE — Telephone Encounter (Signed)
Has kidney disease and not safe to take allopurinol

## 2014-06-01 NOTE — Telephone Encounter (Signed)
MM, ins co denied uloric until she has tried and failed allopurinol or there are specific medical reasons that the alternative medication is not appropriate to treat the medical condition.

## 2014-06-02 DIAGNOSIS — E785 Hyperlipidemia, unspecified: Secondary | ICD-10-CM | POA: Diagnosis not present

## 2014-06-02 DIAGNOSIS — J449 Chronic obstructive pulmonary disease, unspecified: Secondary | ICD-10-CM | POA: Diagnosis not present

## 2014-06-02 DIAGNOSIS — F329 Major depressive disorder, single episode, unspecified: Secondary | ICD-10-CM | POA: Diagnosis not present

## 2014-06-04 DIAGNOSIS — F329 Major depressive disorder, single episode, unspecified: Secondary | ICD-10-CM | POA: Diagnosis not present

## 2014-06-04 DIAGNOSIS — J449 Chronic obstructive pulmonary disease, unspecified: Secondary | ICD-10-CM | POA: Diagnosis not present

## 2014-06-04 DIAGNOSIS — E785 Hyperlipidemia, unspecified: Secondary | ICD-10-CM | POA: Diagnosis not present

## 2014-06-05 DIAGNOSIS — F329 Major depressive disorder, single episode, unspecified: Secondary | ICD-10-CM | POA: Diagnosis not present

## 2014-06-05 DIAGNOSIS — E785 Hyperlipidemia, unspecified: Secondary | ICD-10-CM | POA: Diagnosis not present

## 2014-06-05 DIAGNOSIS — J449 Chronic obstructive pulmonary disease, unspecified: Secondary | ICD-10-CM | POA: Diagnosis not present

## 2014-06-06 DIAGNOSIS — J449 Chronic obstructive pulmonary disease, unspecified: Secondary | ICD-10-CM | POA: Diagnosis not present

## 2014-06-06 DIAGNOSIS — F329 Major depressive disorder, single episode, unspecified: Secondary | ICD-10-CM | POA: Diagnosis not present

## 2014-06-06 DIAGNOSIS — E785 Hyperlipidemia, unspecified: Secondary | ICD-10-CM | POA: Diagnosis not present

## 2014-06-07 DIAGNOSIS — E785 Hyperlipidemia, unspecified: Secondary | ICD-10-CM | POA: Diagnosis not present

## 2014-06-07 DIAGNOSIS — F329 Major depressive disorder, single episode, unspecified: Secondary | ICD-10-CM | POA: Diagnosis not present

## 2014-06-07 DIAGNOSIS — J449 Chronic obstructive pulmonary disease, unspecified: Secondary | ICD-10-CM | POA: Diagnosis not present

## 2014-06-08 DIAGNOSIS — E785 Hyperlipidemia, unspecified: Secondary | ICD-10-CM | POA: Diagnosis not present

## 2014-06-08 DIAGNOSIS — J449 Chronic obstructive pulmonary disease, unspecified: Secondary | ICD-10-CM | POA: Diagnosis not present

## 2014-06-08 DIAGNOSIS — F329 Major depressive disorder, single episode, unspecified: Secondary | ICD-10-CM | POA: Diagnosis not present

## 2014-06-09 DIAGNOSIS — F329 Major depressive disorder, single episode, unspecified: Secondary | ICD-10-CM | POA: Diagnosis not present

## 2014-06-09 DIAGNOSIS — J449 Chronic obstructive pulmonary disease, unspecified: Secondary | ICD-10-CM | POA: Diagnosis not present

## 2014-06-09 DIAGNOSIS — E785 Hyperlipidemia, unspecified: Secondary | ICD-10-CM | POA: Diagnosis not present

## 2014-06-10 DIAGNOSIS — F329 Major depressive disorder, single episode, unspecified: Secondary | ICD-10-CM | POA: Diagnosis not present

## 2014-06-10 DIAGNOSIS — E785 Hyperlipidemia, unspecified: Secondary | ICD-10-CM | POA: Diagnosis not present

## 2014-06-10 DIAGNOSIS — J449 Chronic obstructive pulmonary disease, unspecified: Secondary | ICD-10-CM | POA: Diagnosis not present

## 2014-06-11 DIAGNOSIS — E785 Hyperlipidemia, unspecified: Secondary | ICD-10-CM | POA: Diagnosis not present

## 2014-06-11 DIAGNOSIS — J449 Chronic obstructive pulmonary disease, unspecified: Secondary | ICD-10-CM | POA: Diagnosis not present

## 2014-06-11 DIAGNOSIS — F329 Major depressive disorder, single episode, unspecified: Secondary | ICD-10-CM | POA: Diagnosis not present

## 2014-06-12 DIAGNOSIS — J449 Chronic obstructive pulmonary disease, unspecified: Secondary | ICD-10-CM | POA: Diagnosis not present

## 2014-06-12 DIAGNOSIS — F329 Major depressive disorder, single episode, unspecified: Secondary | ICD-10-CM | POA: Diagnosis not present

## 2014-06-12 DIAGNOSIS — E785 Hyperlipidemia, unspecified: Secondary | ICD-10-CM | POA: Diagnosis not present

## 2014-06-13 DIAGNOSIS — F329 Major depressive disorder, single episode, unspecified: Secondary | ICD-10-CM | POA: Diagnosis not present

## 2014-06-13 DIAGNOSIS — J449 Chronic obstructive pulmonary disease, unspecified: Secondary | ICD-10-CM | POA: Diagnosis not present

## 2014-06-13 DIAGNOSIS — E785 Hyperlipidemia, unspecified: Secondary | ICD-10-CM | POA: Diagnosis not present

## 2014-06-14 DIAGNOSIS — F329 Major depressive disorder, single episode, unspecified: Secondary | ICD-10-CM | POA: Diagnosis not present

## 2014-06-14 DIAGNOSIS — J449 Chronic obstructive pulmonary disease, unspecified: Secondary | ICD-10-CM | POA: Diagnosis not present

## 2014-06-14 DIAGNOSIS — E785 Hyperlipidemia, unspecified: Secondary | ICD-10-CM | POA: Diagnosis not present

## 2014-06-16 DIAGNOSIS — J449 Chronic obstructive pulmonary disease, unspecified: Secondary | ICD-10-CM | POA: Diagnosis not present

## 2014-06-16 DIAGNOSIS — E785 Hyperlipidemia, unspecified: Secondary | ICD-10-CM | POA: Diagnosis not present

## 2014-06-16 DIAGNOSIS — F329 Major depressive disorder, single episode, unspecified: Secondary | ICD-10-CM | POA: Diagnosis not present

## 2014-06-17 ENCOUNTER — Other Ambulatory Visit: Payer: Self-pay | Admitting: Nurse Practitioner

## 2014-06-17 DIAGNOSIS — G894 Chronic pain syndrome: Secondary | ICD-10-CM

## 2014-06-17 DIAGNOSIS — J449 Chronic obstructive pulmonary disease, unspecified: Secondary | ICD-10-CM | POA: Diagnosis not present

## 2014-06-17 DIAGNOSIS — E785 Hyperlipidemia, unspecified: Secondary | ICD-10-CM | POA: Diagnosis not present

## 2014-06-17 DIAGNOSIS — F329 Major depressive disorder, single episode, unspecified: Secondary | ICD-10-CM | POA: Diagnosis not present

## 2014-06-18 DIAGNOSIS — F329 Major depressive disorder, single episode, unspecified: Secondary | ICD-10-CM | POA: Diagnosis not present

## 2014-06-18 DIAGNOSIS — J449 Chronic obstructive pulmonary disease, unspecified: Secondary | ICD-10-CM | POA: Diagnosis not present

## 2014-06-18 DIAGNOSIS — E785 Hyperlipidemia, unspecified: Secondary | ICD-10-CM | POA: Diagnosis not present

## 2014-06-18 MED ORDER — OXYCODONE-ACETAMINOPHEN 10-325 MG PO TABS: 1.0000 | ORAL_TABLET | Freq: Two times a day (BID) | ORAL | Status: DC

## 2014-06-18 NOTE — Telephone Encounter (Signed)
Patients daughter notified that rx up front ready to pick up

## 2014-06-18 NOTE — Telephone Encounter (Signed)
Percocet rx ready for pick up.

## 2014-06-19 DIAGNOSIS — E785 Hyperlipidemia, unspecified: Secondary | ICD-10-CM | POA: Diagnosis not present

## 2014-06-19 DIAGNOSIS — F329 Major depressive disorder, single episode, unspecified: Secondary | ICD-10-CM | POA: Diagnosis not present

## 2014-06-19 DIAGNOSIS — J449 Chronic obstructive pulmonary disease, unspecified: Secondary | ICD-10-CM | POA: Diagnosis not present

## 2014-06-20 DIAGNOSIS — F329 Major depressive disorder, single episode, unspecified: Secondary | ICD-10-CM | POA: Diagnosis not present

## 2014-06-20 DIAGNOSIS — J449 Chronic obstructive pulmonary disease, unspecified: Secondary | ICD-10-CM | POA: Diagnosis not present

## 2014-06-20 DIAGNOSIS — E785 Hyperlipidemia, unspecified: Secondary | ICD-10-CM | POA: Diagnosis not present

## 2014-06-21 DIAGNOSIS — J449 Chronic obstructive pulmonary disease, unspecified: Secondary | ICD-10-CM | POA: Diagnosis not present

## 2014-06-21 DIAGNOSIS — F329 Major depressive disorder, single episode, unspecified: Secondary | ICD-10-CM | POA: Diagnosis not present

## 2014-06-21 DIAGNOSIS — E785 Hyperlipidemia, unspecified: Secondary | ICD-10-CM | POA: Diagnosis not present

## 2014-06-22 DIAGNOSIS — E785 Hyperlipidemia, unspecified: Secondary | ICD-10-CM | POA: Diagnosis not present

## 2014-06-22 DIAGNOSIS — J449 Chronic obstructive pulmonary disease, unspecified: Secondary | ICD-10-CM | POA: Diagnosis not present

## 2014-06-22 DIAGNOSIS — F329 Major depressive disorder, single episode, unspecified: Secondary | ICD-10-CM | POA: Diagnosis not present

## 2014-06-23 ENCOUNTER — Telehealth: Payer: Self-pay | Admitting: Nurse Practitioner

## 2014-06-23 ENCOUNTER — Other Ambulatory Visit: Payer: Self-pay | Admitting: Nurse Practitioner

## 2014-06-23 DIAGNOSIS — F329 Major depressive disorder, single episode, unspecified: Secondary | ICD-10-CM | POA: Diagnosis not present

## 2014-06-23 DIAGNOSIS — E785 Hyperlipidemia, unspecified: Secondary | ICD-10-CM | POA: Diagnosis not present

## 2014-06-23 DIAGNOSIS — J449 Chronic obstructive pulmonary disease, unspecified: Secondary | ICD-10-CM | POA: Diagnosis not present

## 2014-06-23 NOTE — Telephone Encounter (Signed)
Appeal filed on 06-22-14

## 2014-06-23 NOTE — Telephone Encounter (Signed)
Discussed Uloric with patient. She wasn't sure if she should continue taking it after gout flare resolved. Explained that this medication will keep uric acid level low and prevent gout flare ups so it should be taken daily. She will f/u if she has any questions or problems.

## 2014-06-24 DIAGNOSIS — E785 Hyperlipidemia, unspecified: Secondary | ICD-10-CM | POA: Diagnosis not present

## 2014-06-24 DIAGNOSIS — F329 Major depressive disorder, single episode, unspecified: Secondary | ICD-10-CM | POA: Diagnosis not present

## 2014-06-24 DIAGNOSIS — J449 Chronic obstructive pulmonary disease, unspecified: Secondary | ICD-10-CM | POA: Diagnosis not present

## 2014-06-25 DIAGNOSIS — E785 Hyperlipidemia, unspecified: Secondary | ICD-10-CM | POA: Diagnosis not present

## 2014-06-25 DIAGNOSIS — F329 Major depressive disorder, single episode, unspecified: Secondary | ICD-10-CM | POA: Diagnosis not present

## 2014-06-25 DIAGNOSIS — J449 Chronic obstructive pulmonary disease, unspecified: Secondary | ICD-10-CM | POA: Diagnosis not present

## 2014-06-26 DIAGNOSIS — F329 Major depressive disorder, single episode, unspecified: Secondary | ICD-10-CM | POA: Diagnosis not present

## 2014-06-26 DIAGNOSIS — J449 Chronic obstructive pulmonary disease, unspecified: Secondary | ICD-10-CM | POA: Diagnosis not present

## 2014-06-26 DIAGNOSIS — E785 Hyperlipidemia, unspecified: Secondary | ICD-10-CM | POA: Diagnosis not present

## 2014-06-27 DIAGNOSIS — E785 Hyperlipidemia, unspecified: Secondary | ICD-10-CM | POA: Diagnosis not present

## 2014-06-27 DIAGNOSIS — F329 Major depressive disorder, single episode, unspecified: Secondary | ICD-10-CM | POA: Diagnosis not present

## 2014-06-27 DIAGNOSIS — J449 Chronic obstructive pulmonary disease, unspecified: Secondary | ICD-10-CM | POA: Diagnosis not present

## 2014-06-28 DIAGNOSIS — F329 Major depressive disorder, single episode, unspecified: Secondary | ICD-10-CM | POA: Diagnosis not present

## 2014-06-28 DIAGNOSIS — E785 Hyperlipidemia, unspecified: Secondary | ICD-10-CM | POA: Diagnosis not present

## 2014-06-28 DIAGNOSIS — J449 Chronic obstructive pulmonary disease, unspecified: Secondary | ICD-10-CM | POA: Diagnosis not present

## 2014-06-29 DIAGNOSIS — E785 Hyperlipidemia, unspecified: Secondary | ICD-10-CM | POA: Diagnosis not present

## 2014-06-29 DIAGNOSIS — J449 Chronic obstructive pulmonary disease, unspecified: Secondary | ICD-10-CM | POA: Diagnosis not present

## 2014-06-29 DIAGNOSIS — F329 Major depressive disorder, single episode, unspecified: Secondary | ICD-10-CM | POA: Diagnosis not present

## 2014-06-30 DIAGNOSIS — J449 Chronic obstructive pulmonary disease, unspecified: Secondary | ICD-10-CM | POA: Diagnosis not present

## 2014-06-30 DIAGNOSIS — F329 Major depressive disorder, single episode, unspecified: Secondary | ICD-10-CM | POA: Diagnosis not present

## 2014-06-30 DIAGNOSIS — E785 Hyperlipidemia, unspecified: Secondary | ICD-10-CM | POA: Diagnosis not present

## 2014-07-01 DIAGNOSIS — J449 Chronic obstructive pulmonary disease, unspecified: Secondary | ICD-10-CM | POA: Diagnosis not present

## 2014-07-01 DIAGNOSIS — F329 Major depressive disorder, single episode, unspecified: Secondary | ICD-10-CM | POA: Diagnosis not present

## 2014-07-01 DIAGNOSIS — E785 Hyperlipidemia, unspecified: Secondary | ICD-10-CM | POA: Diagnosis not present

## 2014-07-02 DIAGNOSIS — E785 Hyperlipidemia, unspecified: Secondary | ICD-10-CM | POA: Diagnosis not present

## 2014-07-02 DIAGNOSIS — F329 Major depressive disorder, single episode, unspecified: Secondary | ICD-10-CM | POA: Diagnosis not present

## 2014-07-02 DIAGNOSIS — J449 Chronic obstructive pulmonary disease, unspecified: Secondary | ICD-10-CM | POA: Diagnosis not present

## 2014-07-03 DIAGNOSIS — J449 Chronic obstructive pulmonary disease, unspecified: Secondary | ICD-10-CM | POA: Diagnosis not present

## 2014-07-03 DIAGNOSIS — F329 Major depressive disorder, single episode, unspecified: Secondary | ICD-10-CM | POA: Diagnosis not present

## 2014-07-03 DIAGNOSIS — E785 Hyperlipidemia, unspecified: Secondary | ICD-10-CM | POA: Diagnosis not present

## 2014-07-04 DIAGNOSIS — J449 Chronic obstructive pulmonary disease, unspecified: Secondary | ICD-10-CM | POA: Diagnosis not present

## 2014-07-04 DIAGNOSIS — F329 Major depressive disorder, single episode, unspecified: Secondary | ICD-10-CM | POA: Diagnosis not present

## 2014-07-04 DIAGNOSIS — E785 Hyperlipidemia, unspecified: Secondary | ICD-10-CM | POA: Diagnosis not present

## 2014-07-05 DIAGNOSIS — E785 Hyperlipidemia, unspecified: Secondary | ICD-10-CM | POA: Diagnosis not present

## 2014-07-05 DIAGNOSIS — J449 Chronic obstructive pulmonary disease, unspecified: Secondary | ICD-10-CM | POA: Diagnosis not present

## 2014-07-05 DIAGNOSIS — F329 Major depressive disorder, single episode, unspecified: Secondary | ICD-10-CM | POA: Diagnosis not present

## 2014-07-07 DIAGNOSIS — J449 Chronic obstructive pulmonary disease, unspecified: Secondary | ICD-10-CM | POA: Diagnosis not present

## 2014-07-07 DIAGNOSIS — E785 Hyperlipidemia, unspecified: Secondary | ICD-10-CM | POA: Diagnosis not present

## 2014-07-07 DIAGNOSIS — F329 Major depressive disorder, single episode, unspecified: Secondary | ICD-10-CM | POA: Diagnosis not present

## 2014-07-08 DIAGNOSIS — J449 Chronic obstructive pulmonary disease, unspecified: Secondary | ICD-10-CM | POA: Diagnosis not present

## 2014-07-08 DIAGNOSIS — E785 Hyperlipidemia, unspecified: Secondary | ICD-10-CM | POA: Diagnosis not present

## 2014-07-08 DIAGNOSIS — F329 Major depressive disorder, single episode, unspecified: Secondary | ICD-10-CM | POA: Diagnosis not present

## 2014-07-09 DIAGNOSIS — F329 Major depressive disorder, single episode, unspecified: Secondary | ICD-10-CM | POA: Diagnosis not present

## 2014-07-09 DIAGNOSIS — J449 Chronic obstructive pulmonary disease, unspecified: Secondary | ICD-10-CM | POA: Diagnosis not present

## 2014-07-09 DIAGNOSIS — E785 Hyperlipidemia, unspecified: Secondary | ICD-10-CM | POA: Diagnosis not present

## 2014-07-10 DIAGNOSIS — I129 Hypertensive chronic kidney disease with stage 1 through stage 4 chronic kidney disease, or unspecified chronic kidney disease: Secondary | ICD-10-CM | POA: Diagnosis not present

## 2014-07-10 DIAGNOSIS — E559 Vitamin D deficiency, unspecified: Secondary | ICD-10-CM | POA: Diagnosis not present

## 2014-07-10 DIAGNOSIS — F329 Major depressive disorder, single episode, unspecified: Secondary | ICD-10-CM | POA: Diagnosis not present

## 2014-07-10 DIAGNOSIS — E785 Hyperlipidemia, unspecified: Secondary | ICD-10-CM | POA: Diagnosis not present

## 2014-07-10 DIAGNOSIS — E1129 Type 2 diabetes mellitus with other diabetic kidney complication: Secondary | ICD-10-CM | POA: Diagnosis not present

## 2014-07-10 DIAGNOSIS — N189 Chronic kidney disease, unspecified: Secondary | ICD-10-CM | POA: Diagnosis not present

## 2014-07-10 DIAGNOSIS — M1000 Idiopathic gout, unspecified site: Secondary | ICD-10-CM | POA: Diagnosis not present

## 2014-07-10 DIAGNOSIS — J449 Chronic obstructive pulmonary disease, unspecified: Secondary | ICD-10-CM | POA: Diagnosis not present

## 2014-07-10 DIAGNOSIS — D631 Anemia in chronic kidney disease: Secondary | ICD-10-CM | POA: Diagnosis not present

## 2014-07-11 DIAGNOSIS — F329 Major depressive disorder, single episode, unspecified: Secondary | ICD-10-CM | POA: Diagnosis not present

## 2014-07-11 DIAGNOSIS — E785 Hyperlipidemia, unspecified: Secondary | ICD-10-CM | POA: Diagnosis not present

## 2014-07-11 DIAGNOSIS — J449 Chronic obstructive pulmonary disease, unspecified: Secondary | ICD-10-CM | POA: Diagnosis not present

## 2014-07-12 DIAGNOSIS — J449 Chronic obstructive pulmonary disease, unspecified: Secondary | ICD-10-CM | POA: Diagnosis not present

## 2014-07-12 DIAGNOSIS — F329 Major depressive disorder, single episode, unspecified: Secondary | ICD-10-CM | POA: Diagnosis not present

## 2014-07-12 DIAGNOSIS — E785 Hyperlipidemia, unspecified: Secondary | ICD-10-CM | POA: Diagnosis not present

## 2014-07-13 DIAGNOSIS — D509 Iron deficiency anemia, unspecified: Secondary | ICD-10-CM | POA: Diagnosis not present

## 2014-07-13 DIAGNOSIS — F329 Major depressive disorder, single episode, unspecified: Secondary | ICD-10-CM | POA: Diagnosis not present

## 2014-07-13 DIAGNOSIS — J449 Chronic obstructive pulmonary disease, unspecified: Secondary | ICD-10-CM | POA: Diagnosis not present

## 2014-07-13 DIAGNOSIS — E785 Hyperlipidemia, unspecified: Secondary | ICD-10-CM | POA: Diagnosis not present

## 2014-07-14 DIAGNOSIS — E785 Hyperlipidemia, unspecified: Secondary | ICD-10-CM | POA: Diagnosis not present

## 2014-07-14 DIAGNOSIS — J449 Chronic obstructive pulmonary disease, unspecified: Secondary | ICD-10-CM | POA: Diagnosis not present

## 2014-07-14 DIAGNOSIS — F329 Major depressive disorder, single episode, unspecified: Secondary | ICD-10-CM | POA: Diagnosis not present

## 2014-07-15 ENCOUNTER — Telehealth: Payer: Self-pay | Admitting: Nurse Practitioner

## 2014-07-15 DIAGNOSIS — E785 Hyperlipidemia, unspecified: Secondary | ICD-10-CM | POA: Diagnosis not present

## 2014-07-15 DIAGNOSIS — G894 Chronic pain syndrome: Secondary | ICD-10-CM

## 2014-07-15 DIAGNOSIS — F329 Major depressive disorder, single episode, unspecified: Secondary | ICD-10-CM | POA: Diagnosis not present

## 2014-07-15 DIAGNOSIS — J449 Chronic obstructive pulmonary disease, unspecified: Secondary | ICD-10-CM | POA: Diagnosis not present

## 2014-07-16 DIAGNOSIS — E785 Hyperlipidemia, unspecified: Secondary | ICD-10-CM | POA: Diagnosis not present

## 2014-07-16 DIAGNOSIS — F329 Major depressive disorder, single episode, unspecified: Secondary | ICD-10-CM | POA: Diagnosis not present

## 2014-07-16 DIAGNOSIS — J449 Chronic obstructive pulmonary disease, unspecified: Secondary | ICD-10-CM | POA: Diagnosis not present

## 2014-07-16 MED ORDER — OXYCODONE-ACETAMINOPHEN 10-325 MG PO TABS: 1.0000 | ORAL_TABLET | Freq: Two times a day (BID) | ORAL | Status: DC

## 2014-07-16 NOTE — Telephone Encounter (Signed)
Oxycodone rx ready for pick up  

## 2014-07-16 NOTE — Telephone Encounter (Signed)
Pt aware.

## 2014-07-17 DIAGNOSIS — J449 Chronic obstructive pulmonary disease, unspecified: Secondary | ICD-10-CM | POA: Diagnosis not present

## 2014-07-17 DIAGNOSIS — E785 Hyperlipidemia, unspecified: Secondary | ICD-10-CM | POA: Diagnosis not present

## 2014-07-17 DIAGNOSIS — F329 Major depressive disorder, single episode, unspecified: Secondary | ICD-10-CM | POA: Diagnosis not present

## 2014-07-18 DIAGNOSIS — J449 Chronic obstructive pulmonary disease, unspecified: Secondary | ICD-10-CM | POA: Diagnosis not present

## 2014-07-18 DIAGNOSIS — E785 Hyperlipidemia, unspecified: Secondary | ICD-10-CM | POA: Diagnosis not present

## 2014-07-18 DIAGNOSIS — F329 Major depressive disorder, single episode, unspecified: Secondary | ICD-10-CM | POA: Diagnosis not present

## 2014-07-19 DIAGNOSIS — J449 Chronic obstructive pulmonary disease, unspecified: Secondary | ICD-10-CM | POA: Diagnosis not present

## 2014-07-19 DIAGNOSIS — F329 Major depressive disorder, single episode, unspecified: Secondary | ICD-10-CM | POA: Diagnosis not present

## 2014-07-19 DIAGNOSIS — E785 Hyperlipidemia, unspecified: Secondary | ICD-10-CM | POA: Diagnosis not present

## 2014-07-20 DIAGNOSIS — J449 Chronic obstructive pulmonary disease, unspecified: Secondary | ICD-10-CM | POA: Diagnosis not present

## 2014-07-20 DIAGNOSIS — E785 Hyperlipidemia, unspecified: Secondary | ICD-10-CM | POA: Diagnosis not present

## 2014-07-20 DIAGNOSIS — F329 Major depressive disorder, single episode, unspecified: Secondary | ICD-10-CM | POA: Diagnosis not present

## 2014-07-21 ENCOUNTER — Other Ambulatory Visit (HOSPITAL_COMMUNITY): Payer: Self-pay | Admitting: *Deleted

## 2014-07-21 DIAGNOSIS — F329 Major depressive disorder, single episode, unspecified: Secondary | ICD-10-CM | POA: Diagnosis not present

## 2014-07-21 DIAGNOSIS — J449 Chronic obstructive pulmonary disease, unspecified: Secondary | ICD-10-CM | POA: Diagnosis not present

## 2014-07-21 DIAGNOSIS — E785 Hyperlipidemia, unspecified: Secondary | ICD-10-CM | POA: Diagnosis not present

## 2014-07-22 ENCOUNTER — Ambulatory Visit (HOSPITAL_COMMUNITY)
Admission: RE | Admit: 2014-07-22 | Discharge: 2014-07-22 | Disposition: A | Discharge status: Home / Self Care | Payer: Medicare Other | Source: Ambulatory Visit | Attending: Nephrology | Admitting: Nephrology

## 2014-07-22 DIAGNOSIS — D509 Iron deficiency anemia, unspecified: Secondary | ICD-10-CM | POA: Diagnosis not present

## 2014-07-22 DIAGNOSIS — Z7689 Persons encountering health services in other specified circumstances: Secondary | ICD-10-CM | POA: Diagnosis not present

## 2014-07-22 MED ORDER — FERUMOXYTOL INJECTION 510 MG/17 ML
510.0000 mg | Freq: Once | INTRAVENOUS | Status: AC
  Administered 2014-07-22: 510 mg via INTRAVENOUS
  Filled 2014-07-22: qty 17

## 2014-07-22 NOTE — Discharge Instructions (Signed)

## 2014-07-23 DIAGNOSIS — F329 Major depressive disorder, single episode, unspecified: Secondary | ICD-10-CM | POA: Diagnosis not present

## 2014-07-23 DIAGNOSIS — E785 Hyperlipidemia, unspecified: Secondary | ICD-10-CM | POA: Diagnosis not present

## 2014-07-23 DIAGNOSIS — J449 Chronic obstructive pulmonary disease, unspecified: Secondary | ICD-10-CM | POA: Diagnosis not present

## 2014-07-24 DIAGNOSIS — F329 Major depressive disorder, single episode, unspecified: Secondary | ICD-10-CM | POA: Diagnosis not present

## 2014-07-24 DIAGNOSIS — E785 Hyperlipidemia, unspecified: Secondary | ICD-10-CM | POA: Diagnosis not present

## 2014-07-24 DIAGNOSIS — J449 Chronic obstructive pulmonary disease, unspecified: Secondary | ICD-10-CM | POA: Diagnosis not present

## 2014-07-25 DIAGNOSIS — F329 Major depressive disorder, single episode, unspecified: Secondary | ICD-10-CM | POA: Diagnosis not present

## 2014-07-25 DIAGNOSIS — E785 Hyperlipidemia, unspecified: Secondary | ICD-10-CM | POA: Diagnosis not present

## 2014-07-25 DIAGNOSIS — J449 Chronic obstructive pulmonary disease, unspecified: Secondary | ICD-10-CM | POA: Diagnosis not present

## 2014-07-26 DIAGNOSIS — E785 Hyperlipidemia, unspecified: Secondary | ICD-10-CM | POA: Diagnosis not present

## 2014-07-26 DIAGNOSIS — J449 Chronic obstructive pulmonary disease, unspecified: Secondary | ICD-10-CM | POA: Diagnosis not present

## 2014-07-26 DIAGNOSIS — F329 Major depressive disorder, single episode, unspecified: Secondary | ICD-10-CM | POA: Diagnosis not present

## 2014-07-27 ENCOUNTER — Encounter: Payer: Self-pay | Admitting: Internal Medicine

## 2014-07-27 DIAGNOSIS — J449 Chronic obstructive pulmonary disease, unspecified: Secondary | ICD-10-CM | POA: Diagnosis not present

## 2014-07-27 DIAGNOSIS — F329 Major depressive disorder, single episode, unspecified: Secondary | ICD-10-CM | POA: Diagnosis not present

## 2014-07-27 DIAGNOSIS — E785 Hyperlipidemia, unspecified: Secondary | ICD-10-CM | POA: Diagnosis not present

## 2014-07-28 DIAGNOSIS — J449 Chronic obstructive pulmonary disease, unspecified: Secondary | ICD-10-CM | POA: Diagnosis not present

## 2014-07-28 DIAGNOSIS — F329 Major depressive disorder, single episode, unspecified: Secondary | ICD-10-CM | POA: Diagnosis not present

## 2014-07-28 DIAGNOSIS — E785 Hyperlipidemia, unspecified: Secondary | ICD-10-CM | POA: Diagnosis not present

## 2014-07-29 DIAGNOSIS — E785 Hyperlipidemia, unspecified: Secondary | ICD-10-CM | POA: Diagnosis not present

## 2014-07-29 DIAGNOSIS — F329 Major depressive disorder, single episode, unspecified: Secondary | ICD-10-CM | POA: Diagnosis not present

## 2014-07-29 DIAGNOSIS — J449 Chronic obstructive pulmonary disease, unspecified: Secondary | ICD-10-CM | POA: Diagnosis not present

## 2014-07-30 DIAGNOSIS — F329 Major depressive disorder, single episode, unspecified: Secondary | ICD-10-CM | POA: Diagnosis not present

## 2014-07-30 DIAGNOSIS — J449 Chronic obstructive pulmonary disease, unspecified: Secondary | ICD-10-CM | POA: Diagnosis not present

## 2014-07-30 DIAGNOSIS — E785 Hyperlipidemia, unspecified: Secondary | ICD-10-CM | POA: Diagnosis not present

## 2014-07-31 DIAGNOSIS — E785 Hyperlipidemia, unspecified: Secondary | ICD-10-CM | POA: Diagnosis not present

## 2014-07-31 DIAGNOSIS — F329 Major depressive disorder, single episode, unspecified: Secondary | ICD-10-CM | POA: Diagnosis not present

## 2014-07-31 DIAGNOSIS — J449 Chronic obstructive pulmonary disease, unspecified: Secondary | ICD-10-CM | POA: Diagnosis not present

## 2014-08-01 DIAGNOSIS — F329 Major depressive disorder, single episode, unspecified: Secondary | ICD-10-CM | POA: Diagnosis not present

## 2014-08-01 DIAGNOSIS — E785 Hyperlipidemia, unspecified: Secondary | ICD-10-CM | POA: Diagnosis not present

## 2014-08-01 DIAGNOSIS — J449 Chronic obstructive pulmonary disease, unspecified: Secondary | ICD-10-CM | POA: Diagnosis not present

## 2014-08-02 DIAGNOSIS — E785 Hyperlipidemia, unspecified: Secondary | ICD-10-CM | POA: Diagnosis not present

## 2014-08-02 DIAGNOSIS — F329 Major depressive disorder, single episode, unspecified: Secondary | ICD-10-CM | POA: Diagnosis not present

## 2014-08-02 DIAGNOSIS — J449 Chronic obstructive pulmonary disease, unspecified: Secondary | ICD-10-CM | POA: Diagnosis not present

## 2014-08-03 DIAGNOSIS — E785 Hyperlipidemia, unspecified: Secondary | ICD-10-CM | POA: Diagnosis not present

## 2014-08-03 DIAGNOSIS — F329 Major depressive disorder, single episode, unspecified: Secondary | ICD-10-CM | POA: Diagnosis not present

## 2014-08-03 DIAGNOSIS — J449 Chronic obstructive pulmonary disease, unspecified: Secondary | ICD-10-CM | POA: Diagnosis not present

## 2014-08-04 DIAGNOSIS — E785 Hyperlipidemia, unspecified: Secondary | ICD-10-CM | POA: Diagnosis not present

## 2014-08-04 DIAGNOSIS — J449 Chronic obstructive pulmonary disease, unspecified: Secondary | ICD-10-CM | POA: Diagnosis not present

## 2014-08-04 DIAGNOSIS — F329 Major depressive disorder, single episode, unspecified: Secondary | ICD-10-CM | POA: Diagnosis not present

## 2014-08-05 DIAGNOSIS — J449 Chronic obstructive pulmonary disease, unspecified: Secondary | ICD-10-CM | POA: Diagnosis not present

## 2014-08-05 DIAGNOSIS — E785 Hyperlipidemia, unspecified: Secondary | ICD-10-CM | POA: Diagnosis not present

## 2014-08-05 DIAGNOSIS — F329 Major depressive disorder, single episode, unspecified: Secondary | ICD-10-CM | POA: Diagnosis not present

## 2014-08-06 DIAGNOSIS — J449 Chronic obstructive pulmonary disease, unspecified: Secondary | ICD-10-CM | POA: Diagnosis not present

## 2014-08-06 DIAGNOSIS — F329 Major depressive disorder, single episode, unspecified: Secondary | ICD-10-CM | POA: Diagnosis not present

## 2014-08-06 DIAGNOSIS — E785 Hyperlipidemia, unspecified: Secondary | ICD-10-CM | POA: Diagnosis not present

## 2014-08-07 DIAGNOSIS — F329 Major depressive disorder, single episode, unspecified: Secondary | ICD-10-CM | POA: Diagnosis not present

## 2014-08-07 DIAGNOSIS — E785 Hyperlipidemia, unspecified: Secondary | ICD-10-CM | POA: Diagnosis not present

## 2014-08-07 DIAGNOSIS — J449 Chronic obstructive pulmonary disease, unspecified: Secondary | ICD-10-CM | POA: Diagnosis not present

## 2014-08-08 DIAGNOSIS — J449 Chronic obstructive pulmonary disease, unspecified: Secondary | ICD-10-CM | POA: Diagnosis not present

## 2014-08-08 DIAGNOSIS — F329 Major depressive disorder, single episode, unspecified: Secondary | ICD-10-CM | POA: Diagnosis not present

## 2014-08-08 DIAGNOSIS — E785 Hyperlipidemia, unspecified: Secondary | ICD-10-CM | POA: Diagnosis not present

## 2014-08-12 ENCOUNTER — Telehealth: Payer: Self-pay | Admitting: Nurse Practitioner

## 2014-08-12 NOTE — Telephone Encounter (Signed)
Spoke with daughter and she is aware that her lasix is 40mg  daily

## 2014-08-18 ENCOUNTER — Other Ambulatory Visit: Payer: Self-pay | Admitting: Nurse Practitioner

## 2014-08-18 DIAGNOSIS — G894 Chronic pain syndrome: Secondary | ICD-10-CM

## 2014-08-18 MED ORDER — OXYCODONE-ACETAMINOPHEN 10-325 MG PO TABS: 1.0000 | ORAL_TABLET | Freq: Two times a day (BID) | ORAL | Status: DC

## 2014-08-18 NOTE — Telephone Encounter (Signed)
Patient notified that rx up front ready for pick up

## 2014-08-18 NOTE — Telephone Encounter (Signed)
Last filled 07/16/2014

## 2014-08-18 NOTE — Telephone Encounter (Signed)
Percocet rx ready for pick up.

## 2014-08-21 ENCOUNTER — Ambulatory Visit: Payer: Medicare Other | Admitting: Nurse Practitioner

## 2014-08-21 ENCOUNTER — Ambulatory Visit (INDEPENDENT_AMBULATORY_CARE_PROVIDER_SITE_OTHER): Payer: Medicare Other | Admitting: Nurse Practitioner

## 2014-08-21 ENCOUNTER — Encounter: Payer: Self-pay | Admitting: Nurse Practitioner

## 2014-08-21 VITALS — BP 107/45 | HR 65 | Temp 97.0°F | Ht 63.0 in | Wt 245.6 lb

## 2014-08-21 DIAGNOSIS — Z794 Long term (current) use of insulin: Secondary | ICD-10-CM | POA: Diagnosis not present

## 2014-08-21 DIAGNOSIS — G2581 Restless legs syndrome: Secondary | ICD-10-CM

## 2014-08-21 DIAGNOSIS — I159 Secondary hypertension, unspecified: Secondary | ICD-10-CM | POA: Diagnosis not present

## 2014-08-21 DIAGNOSIS — E0843 Diabetes mellitus due to underlying condition with diabetic autonomic (poly)neuropathy: Secondary | ICD-10-CM | POA: Diagnosis not present

## 2014-08-21 DIAGNOSIS — E119 Type 2 diabetes mellitus without complications: Secondary | ICD-10-CM | POA: Diagnosis not present

## 2014-08-21 DIAGNOSIS — E785 Hyperlipidemia, unspecified: Secondary | ICD-10-CM | POA: Diagnosis not present

## 2014-08-21 DIAGNOSIS — G894 Chronic pain syndrome: Secondary | ICD-10-CM

## 2014-08-21 DIAGNOSIS — Z23 Encounter for immunization: Secondary | ICD-10-CM | POA: Diagnosis not present

## 2014-08-21 DIAGNOSIS — K219 Gastro-esophageal reflux disease without esophagitis: Secondary | ICD-10-CM | POA: Diagnosis not present

## 2014-08-21 DIAGNOSIS — E669 Obesity, unspecified: Secondary | ICD-10-CM

## 2014-08-21 DIAGNOSIS — D649 Anemia, unspecified: Secondary | ICD-10-CM

## 2014-08-21 DIAGNOSIS — R609 Edema, unspecified: Secondary | ICD-10-CM

## 2014-08-21 DIAGNOSIS — J410 Simple chronic bronchitis: Secondary | ICD-10-CM | POA: Diagnosis not present

## 2014-08-21 LAB — POCT GLYCOSYLATED HEMOGLOBIN (HGB A1C): HEMOGLOBIN A1C: 6.8

## 2014-08-21 LAB — GLUCOSE, POCT (MANUAL RESULT ENTRY): POC Glucose: 50 mg/dl — AB (ref 70–99)

## 2014-08-21 MED ORDER — OXYCODONE-ACETAMINOPHEN 10-325 MG PO TABS: 1.0000 | ORAL_TABLET | Freq: Two times a day (BID) | ORAL | Status: DC

## 2014-08-21 MED ORDER — FUROSEMIDE 40 MG PO TABS: ORAL_TABLET | ORAL | Status: DC

## 2014-08-21 NOTE — Patient Instructions (Signed)

## 2014-08-21 NOTE — Addendum Note (Signed)
Addended by: Chevis Pretty on: 08/21/2014 10:53 AM   Modules accepted: Orders

## 2014-08-21 NOTE — Progress Notes (Signed)
Subjective:    Patient ID: Anna [name], female    DOB: 1935/04/18, 80 y.o.   MRN: [id number]  Patient here today for follow up of chronic medical problems. No complaints today other than blood sugar ws in the 50's this morning. Hypertension This is a chronic problem. The current episode started more than 1 year ago. The problem is controlled. Associated symptoms include shortness of breath. Pertinent negatives include no chest pain or palpitations. Risk factors for coronary artery disease include diabetes mellitus, dyslipidemia, obesity, post-menopausal state and sedentary lifestyle. Past treatments include beta blockers, central alpha agonists and calcium channel blockers. The current treatment provides moderate improvement. Compliance problems include diet and exercise.  Hypertensive end-organ damage includes CAD/MI.  Hyperlipidemia This is a chronic problem. The current episode started more than 1 year ago. The problem is uncontrolled. Recent lipid tests were reviewed and are normal. Exacerbating diseases include diabetes and obesity. She has no history of hypothyroidism. Associated symptoms include shortness of breath. Pertinent negatives include no chest pain or myalgias. Current antihyperlipidemic treatment includes statins and fibric acid derivatives. The current treatment provides moderate improvement of lipids. Compliance problems include adherence to diet and adherence to exercise.  Risk factors for coronary artery disease include diabetes mellitus, dyslipidemia, hypertension and post-menopausal.  Diabetes She presents for her follow-up diabetic visit. She has type 2 diabetes mellitus. No MedicAlert identification noted. Her disease course has been fluctuating. Hypoglycemia symptoms include dizziness. Associated symptoms include fatigue and weakness. Pertinent negatives for diabetes include no chest pain and no visual change. Diabetic complications include heart disease. Risk factors for  coronary artery disease include diabetes mellitus, dyslipidemia, hypertension and post-menopausal. Current diabetic treatment includes insulin injections and oral agent (monotherapy). She is compliant with treatment some of the time. Her weight is stable. When asked about meal planning, she reported none. She has not had a previous visit with a dietitian. She never participates in exercise. Her breakfast blood glucose is taken between 8-9 am. Her breakfast blood glucose range is generally 130-140 mg/dl. Her overall blood glucose range is 130-140 mg/dl. An ACE inhibitor/angiotensin II receptor blocker is not being taken. She does not see a podiatrist.Eye exam is not current.  Gastrophageal Reflux She reports no chest pain, no coughing or no nausea. Associated symptoms include fatigue.  Osteoarthritis  Pt takes percocet 1 tab BID for pain knee, and back pain Peripheral Edema Pt takes lasix daily-Pt still has edema in lower extremities. COPD Pt takes Symbicort daily-Pt feels like it is well controlled Peripheral Neuropathy Pt takes neurontin 300 mg TID-Pt does not fell like this is helping. Still  Has lots of nerve pain in her feet- seems to be worse at night.  Review of Systems  Constitutional: Positive for fatigue.  Respiratory: Positive for shortness of breath. Negative for cough.   Cardiovascular: Negative for chest pain and palpitations.  Gastrointestinal: Negative for nausea.  Musculoskeletal: Negative for myalgias.  Neurological: Positive for dizziness and weakness.  All other systems reviewed and are negative.      Objective:   Physical Exam  Constitutional: She is oriented to person, place, and time. She appears well-developed and well-nourished.  HENT:  Head: Normocephalic.  Nose: Nose normal.  Mouth/Throat: Oropharynx is clear and moist.  Eyes: Pupils are equal, round, and reactive to light.  Neck: Normal range of motion. Neck supple.  Cardiovascular: Normal rate, regular  rhythm and intact distal pulses.   Murmur heard. Pulmonary/Chest: Effort normal and breath sounds normal.  Abdominal: Soft.  Bowel sounds are normal. She exhibits no distension. There is no tenderness.  Musculoskeletal: Normal range of motion. She exhibits edema (2+ in LE).  Erythematous medial side of right great toe- very tender to touch.  Neurological: She is alert and oriented to person, place, and time.  Skin: Skin is warm and dry.  Psychiatric: She has a normal mood and affect. Her behavior is normal. Judgment and thought content normal.  Vitals reviewed.    BP 107/45 mmHg  Pulse 65  Temp(Src) 97 F (36.1 C) (Oral)  Ht 5' 3"  (1.6 m)  Wt 245 lb 9.6 oz (111.403 kg)  BMI 43.52 kg/m2   Results for orders placed or performed in visit on 08/21/14  POCT glycosylated hemoglobin (Hb A1C)  Result Value Ref Range   Hemoglobin A1C 6.8         Assessment & Plan:   1. Diabetes mellitus type 2, insulin dependent Continue to watch carbs in diet - POCT glycosylated hemoglobin (Hb A1C) - POCT glucose (manual entry) - CMP14+EGFR - NMR, lipoprofile  2. Secondary hypertension, unspecified Do not add salt to diet - POCT glycosylated hemoglobin (Hb A1C) - POCT glucose (manual entry) - CMP14+EGFR - NMR, lipoprofile  3. Simple chronic bronchitis  4. Gastroesophageal reflux disease without esophagitis Avoid spicy foods Do not eat 2 hours prior to bedtime   5. Diabetic autonomic neuropathy associated with diabetes mellitus due to underlying condition  6. Hyperlipidemia Low fat diet  7. Obesity Discussed diet and exercise for person with BMI >25 Will recheck weight in 3-6 months   8. RLS (restless legs syndrome)  9. Anemia, unspecified anemia type  10. Chronic pain syndrome - oxyCODONE-acetaminophen (PERCOCET) 10-325 MG per tablet; Take 1 tablet by mouth 2 (two) times daily.  Dispense: 60 tablet; Refill: 0  11. Peripheral edema Elevate legs when sitting Increased  lasix by 1/2 tablet at lunch - furosemide (LASIX) 40 MG tablet; 1 in AM and 1/2 at lunch  Dispense: 45 tablet; Refill: 5    Labs pending Health maintenance reviewed Diet and exercise encouraged Continue all meds Follow up  In 3 month   [place], FNP

## 2014-08-22 LAB — CMP14+EGFR
A/G RATIO: 1.7 (ref 1.1–2.5)
ALT: 22 IU/L (ref 0–32)
AST: 52 IU/L — ABNORMAL HIGH (ref 0–40)
Albumin: 3.9 g/dL (ref 3.5–4.8)
Alkaline Phosphatase: 45 IU/L (ref 39–117)
BUN/Creatinine Ratio: 20 (ref 11–26)
BUN: 40 mg/dL — ABNORMAL HIGH (ref 8–27)
Bilirubin Total: 0.4 mg/dL (ref 0.0–1.2)
CO2: 22 mmol/L (ref 18–29)
Calcium: 9.5 mg/dL (ref 8.7–10.3)
Chloride: 99 mmol/L (ref 97–108)
Creatinine, Ser: 1.98 mg/dL — ABNORMAL HIGH (ref 0.57–1.00)
GFR, EST AFRICAN AMERICAN: 27 mL/min/{1.73_m2} — AB (ref >59)
GFR, EST NON AFRICAN AMERICAN: 24 mL/min/{1.73_m2} — AB (ref >59)
GLOBULIN, TOTAL: 2.3 g/dL (ref 1.5–4.5)
GLUCOSE: 66 mg/dL (ref 65–99)
Potassium: 4.6 mmol/L (ref 3.5–5.2)
Sodium: 140 mmol/L (ref 134–144)
TOTAL PROTEIN: 6.2 g/dL (ref 6.0–8.5)

## 2014-08-22 LAB — NMR, LIPOPROFILE
Cholesterol: 126 mg/dL (ref 100–199)
HDL Cholesterol by NMR: 17 mg/dL — ABNORMAL LOW (ref >39)
HDL PARTICLE NUMBER: 22.7 umol/L — AB (ref >=30.5)
LDL PARTICLE NUMBER: 1177 nmol/L — AB (ref <1000)
LDL SIZE: 19.7 nm (ref >20.5)
LP-IR SCORE: 80 — AB (ref <=45)
Small LDL Particle Number: 959 nmol/L — ABNORMAL HIGH (ref <=527)
Triglycerides by NMR: 483 mg/dL — ABNORMAL HIGH (ref 0–149)

## 2014-08-26 DIAGNOSIS — E1129 Type 2 diabetes mellitus with other diabetic kidney complication: Secondary | ICD-10-CM | POA: Diagnosis not present

## 2014-08-26 DIAGNOSIS — D631 Anemia in chronic kidney disease: Secondary | ICD-10-CM | POA: Diagnosis not present

## 2014-08-26 DIAGNOSIS — N189 Chronic kidney disease, unspecified: Secondary | ICD-10-CM | POA: Diagnosis not present

## 2014-08-26 DIAGNOSIS — M1000 Idiopathic gout, unspecified site: Secondary | ICD-10-CM | POA: Diagnosis not present

## 2014-08-26 DIAGNOSIS — I129 Hypertensive chronic kidney disease with stage 1 through stage 4 chronic kidney disease, or unspecified chronic kidney disease: Secondary | ICD-10-CM | POA: Diagnosis not present

## 2014-08-30 DIAGNOSIS — J449 Chronic obstructive pulmonary disease, unspecified: Secondary | ICD-10-CM | POA: Diagnosis not present

## 2014-08-30 DIAGNOSIS — F329 Major depressive disorder, single episode, unspecified: Secondary | ICD-10-CM | POA: Diagnosis not present

## 2014-08-30 DIAGNOSIS — E785 Hyperlipidemia, unspecified: Secondary | ICD-10-CM | POA: Diagnosis not present

## 2014-08-31 DIAGNOSIS — F329 Major depressive disorder, single episode, unspecified: Secondary | ICD-10-CM | POA: Diagnosis not present

## 2014-08-31 DIAGNOSIS — J449 Chronic obstructive pulmonary disease, unspecified: Secondary | ICD-10-CM | POA: Diagnosis not present

## 2014-08-31 DIAGNOSIS — E785 Hyperlipidemia, unspecified: Secondary | ICD-10-CM | POA: Diagnosis not present

## 2014-09-01 DIAGNOSIS — J449 Chronic obstructive pulmonary disease, unspecified: Secondary | ICD-10-CM | POA: Diagnosis not present

## 2014-09-01 DIAGNOSIS — F329 Major depressive disorder, single episode, unspecified: Secondary | ICD-10-CM | POA: Diagnosis not present

## 2014-09-01 DIAGNOSIS — E785 Hyperlipidemia, unspecified: Secondary | ICD-10-CM | POA: Diagnosis not present

## 2014-09-02 DIAGNOSIS — F329 Major depressive disorder, single episode, unspecified: Secondary | ICD-10-CM | POA: Diagnosis not present

## 2014-09-02 DIAGNOSIS — E785 Hyperlipidemia, unspecified: Secondary | ICD-10-CM | POA: Diagnosis not present

## 2014-09-02 DIAGNOSIS — J449 Chronic obstructive pulmonary disease, unspecified: Secondary | ICD-10-CM | POA: Diagnosis not present

## 2014-09-03 ENCOUNTER — Other Ambulatory Visit (HOSPITAL_COMMUNITY): Payer: Self-pay | Admitting: *Deleted

## 2014-09-03 DIAGNOSIS — E785 Hyperlipidemia, unspecified: Secondary | ICD-10-CM | POA: Diagnosis not present

## 2014-09-03 DIAGNOSIS — F329 Major depressive disorder, single episode, unspecified: Secondary | ICD-10-CM | POA: Diagnosis not present

## 2014-09-03 DIAGNOSIS — J449 Chronic obstructive pulmonary disease, unspecified: Secondary | ICD-10-CM | POA: Diagnosis not present

## 2014-09-04 ENCOUNTER — Encounter (HOSPITAL_COMMUNITY)
Admission: RE | Admit: 2014-09-04 | Discharge: 2014-09-04 | Disposition: A | Discharge status: Home / Self Care | Payer: Medicare Other | Source: Ambulatory Visit | Attending: Nephrology | Admitting: Nephrology

## 2014-09-04 DIAGNOSIS — J449 Chronic obstructive pulmonary disease, unspecified: Secondary | ICD-10-CM | POA: Diagnosis not present

## 2014-09-04 DIAGNOSIS — E785 Hyperlipidemia, unspecified: Secondary | ICD-10-CM | POA: Diagnosis not present

## 2014-09-04 DIAGNOSIS — D509 Iron deficiency anemia, unspecified: Secondary | ICD-10-CM | POA: Diagnosis not present

## 2014-09-04 DIAGNOSIS — F329 Major depressive disorder, single episode, unspecified: Secondary | ICD-10-CM | POA: Diagnosis not present

## 2014-09-04 MED ORDER — SODIUM CHLORIDE 0.9 % IV SOLN
510.0000 mg | INTRAVENOUS | Status: DC
  Administered 2014-09-04: 510 mg via INTRAVENOUS
  Filled 2014-09-04: qty 17

## 2014-09-05 DIAGNOSIS — F329 Major depressive disorder, single episode, unspecified: Secondary | ICD-10-CM | POA: Diagnosis not present

## 2014-09-05 DIAGNOSIS — J449 Chronic obstructive pulmonary disease, unspecified: Secondary | ICD-10-CM | POA: Diagnosis not present

## 2014-09-05 DIAGNOSIS — E785 Hyperlipidemia, unspecified: Secondary | ICD-10-CM | POA: Diagnosis not present

## 2014-09-11 ENCOUNTER — Encounter (HOSPITAL_COMMUNITY)
Admission: RE | Admit: 2014-09-11 | Discharge: 2014-09-11 | Disposition: A | Discharge status: Home / Self Care | Payer: Medicare Other | Source: Ambulatory Visit | Attending: Nephrology | Admitting: Nephrology

## 2014-09-11 DIAGNOSIS — D509 Iron deficiency anemia, unspecified: Secondary | ICD-10-CM | POA: Diagnosis not present

## 2014-09-11 MED ORDER — SODIUM CHLORIDE 0.9 % IV SOLN
510.0000 mg | INTRAVENOUS | Status: DC
  Administered 2014-09-11: 510 mg via INTRAVENOUS
  Filled 2014-09-11: qty 17

## 2014-09-16 ENCOUNTER — Other Ambulatory Visit: Payer: Self-pay | Admitting: Nurse Practitioner

## 2014-09-17 ENCOUNTER — Ambulatory Visit (INDEPENDENT_AMBULATORY_CARE_PROVIDER_SITE_OTHER): Payer: Medicare Other | Admitting: Internal Medicine

## 2014-09-17 ENCOUNTER — Encounter: Payer: Self-pay | Admitting: Internal Medicine

## 2014-09-17 VITALS — BP 122/60 | HR 64 | Ht 64.0 in | Wt 239.5 lb

## 2014-09-17 DIAGNOSIS — R195 Other fecal abnormalities: Secondary | ICD-10-CM

## 2014-09-17 DIAGNOSIS — E118 Type 2 diabetes mellitus with unspecified complications: Secondary | ICD-10-CM | POA: Diagnosis not present

## 2014-09-17 DIAGNOSIS — D509 Iron deficiency anemia, unspecified: Secondary | ICD-10-CM

## 2014-09-17 NOTE — Progress Notes (Signed)
Referred by Chevis Pretty, NP Subjective:    Patient ID: Anna [name], female    DOB: 12/29/1934, 80 y.o.   MRN: [id number] Cc: blood in stool HPI  This is an elderly chronically ill ww here with family members  - denies any rectal bleeding or hematochezia but has been found to be heme + on home guaiac stool card testing. She also has a new iron deficiency anemia. Diet does include iron. She has chronic constipation and is asking what she can use to improve defecation - constipation made worse by ferrous sulfate that has been started.Hemorrhoids protrude, ? Tags, but do not bleed. Uses omeprazole with relief of heartburn. Has chronic kidney disease and has seen nephrology - Dr. Lorrene Reid TIBC 495 and iron sat 10% 08/26/14.Hgb 9.5 MCV 81 that day. GFR 21.  GI ROS is otherwise negative  She has refused to undergo a screening colonoscopy when recommended in past.  Allergies  Allergen Reactions  . Niaspan [Niacin Er] Other (See Comments)    flushing   Outpatient Prescriptions Prior to Visit  Medication Sig Dispense Refill  . amLODipine (NORVASC) 5 MG tablet Take 1 tablet (5 mg total) by mouth daily. 30 tablet 5  . aspirin 81 MG tablet Take 81 mg by mouth daily.      . B-D UF III MINI PEN NEEDLES 31G X 5 MM MISC USE TWICE A DAY TO INJECT NOVOLOG MIX 70/30 INSULIN 100 each PRN  . budesonide-formoterol (SYMBICORT) 80-4.5 MCG/ACT inhaler Inhale 1 puff into the lungs 2 (two) times daily. 1 Inhaler 5  . carvedilol (COREG) 25 MG tablet TAKE 1 TABLET BY MOUTH TWICE DAILY WITH A MEAL 60 tablet 5  . citalopram (CELEXA) 20 MG tablet Take 1 tablet (20 mg total) by mouth daily. 30 tablet 5  . cloNIDine (CATAPRES) 0.2 MG tablet Take 1 tablet (0.2 mg total) by mouth 2 (two) times daily. 60 tablet 5  . EASY TOUCH INSULIN SYRINGE 31G X 5/16" 1 ML MISC USE TO INJECT INSULIN TWICE DAILY 100 each 2  . EASY TOUCH PEN NEEDLES 31G X 8 MM MISC USE TWICE DAILY 100 each 3  . fenofibrate (TRICOR) 145 MG tablet  Take 1 tablet (145 mg total) by mouth daily. 30 tablet 5  . furosemide (LASIX) 40 MG tablet 1 in AM and 1/2 at lunch 45 tablet 5  . gabapentin (NEURONTIN) 300 MG capsule TAKE 1 CAPSULE BY MOUTH 4 TIMES A DAY 120 capsule 5  . glimepiride (AMARYL) 4 MG tablet TAKE 1 TABLET BY MOUTH EVERY DAY BEFORE BREAKFAST 30 tablet 5  . glucose blood test strip Test 2-3X a day and prn  Dx 250.02 300 each 12  . hydrocortisone (CORTEF) 20 MG tablet Magic Mouth wash.  TAKE 1 TEASPOONFUL BY MOUTH FOUR TIMES A DAY (SHAKE WELL ANDREFRIGERATE)    . insulin aspart protamine - aspart (NOVOLOG MIX 70/30 FLEXPEN) (70-30) 100 UNIT/ML FlexPen Inject 0.58 mLs (58 Units total) into the skin 2 (two) times daily. 45 mL 3  . NOVOLOG MIX 70/30 FLEXPEN (70-30) 100 UNIT/ML FlexPen INJECT 55 UNITS SUBCUTANEOUSLY TWICE DAILY AS INSTRUCTED 45 mL 3  . omeprazole (PRILOSEC) 40 MG capsule TAKE 1 CAPSULE BY MOUTH EVERY DAY 30 capsule 5  . oxyCODONE-acetaminophen (PERCOCET) 10-325 MG per tablet Take 1 tablet by mouth 2 (two) times daily. 60 tablet 0  . rosuvastatin (CRESTOR) 20 MG tablet Take 1 tablet (20 mg total) by mouth daily. 30 tablet 5  . ULORIC 40 MG  tablet TAKE 1 TABLET BY MOUTH ONCE DAILY 30 tablet 2  . febuxostat (ULORIC) 40 MG tablet Take 1 tablet (40 mg total) by mouth daily. 30 tablet 3   No facility-administered medications prior to visit.   Past Medical History  Diagnosis Date  . Hypertension   . COPD (chronic obstructive pulmonary disease)   . Diabetes mellitus without complication   . Neuromuscular disorder   . Depression   . Osteopenia   . Anemia   . Arthritis   . Anxiety disorder   . GERD (gastroesophageal reflux disease)   . Hyperlipidemia   . Obesity   . Sleep apnea   . Vitamin B12 deficiency   . Chronic kidney disease (CKD), stage IV (severe)    Past Surgical History  Procedure Laterality Date  . Breast surgery  April 1996    needle biopsy   . Tubal ligation     History   Social History  .  Marital Status: Widowed    Spouse Name: N/A  . Number of Children: 40  . Years of Education: N/A   Occupational History  . Retired    Social History Main Topics  . Smoking status: Former Smoker    Types: Cigarettes    Quit date: 05/01/2010  . Smokeless tobacco: Not on file  . Alcohol Use: No  . Drug Use: No  . Sexual Activity: Not on file   Other Topics Concern  . None   Social History Narrative   Widowed 1 son and 1 daughter   Retired   No caffeine   Family History  Problem Relation Age of Onset  . Hypertension Mother   . Hypertension Father   . Diabetes Father   . COPD Father   . Diabetes Sister     x 2  . Colon cancer Neg Hx   . Colon polyps Neg Hx   . Skin cancer Sister   . Testicular cancer Brother   . Esophageal cancer Neg Hx     Review of Systems + anxiety , joint and back pain, depression, fatigue, insomnia, pedal edema, urine leakage All other ROS negative or as per HPI    Objective:   Physical Exam @BP  122/60 mmHg  Pulse 64  Ht 5\' 4"  (1.626 m)  Wt 239 lb 8 oz (108.636 kg)  BMI 41.09 kg/m2@  General:  Well-developed, well-nourished and in no acute distress Eyes:  anicteric. ENT:   Mouth and posterior pharynx free of lesions.  Neck:   supple w/o thyromegaly or mass.  Lungs: Clear to auscultation bilaterally. Heart:  S1S2, no rubs, murmurs, gallops. Abdomen:  soft, non-tender, no hepatosplenomegaly, hernia, or mass and BS+.  Rectal: Deferred until colonoscopy Lymph:  no cervical or supraclavicular adenopathy. Extremities:   no edema, cyanosis or clubbing Skin   no rash. Neuro:  A&O x 3.  Psych:  appropriate mood and  Affect.   Data Reviewed: Lab Results  Component Value Date   WBC 6.2 05/21/2014   HGB 9.7* 05/21/2014   HCT 30.9* 05/21/2014   MCV 85 05/21/2014   PLT 282 05/21/2014   And as per HPI  Reviewed PCP notes     Assessment & Plan:  Anemia, iron deficiency - Plan: Ambulatory referral to Gastroenterology, 0.9 %  sodium  chloride infusion, 0.9 %  sodium chloride infusion  Heme + stool - Plan: Ambulatory referral to Gastroenterology, 0.9 %  sodium chloride infusion, 0.9 %  sodium chloride infusion  Type 2 diabetes mellitus with complication  EGD and colonoscopy are appropriate to investigate this - The risks and benefits as well as alternatives of endoscopic procedure(s) have been discussed and reviewed. All questions answered. The patient agrees to proceed. Her comorbidities are such that scheduling at hospital best for her - she needs the procedures but is at increased risk of and if complications occur due to body habitus and diseases.  I appreciate the opportunity to care for this patient.  SL:HTDSKA,JGOT MARGARET, FNP Jamal Maes, MD

## 2014-09-17 NOTE — Patient Instructions (Addendum)
You have been scheduled for a colonoscopy and EGD at [facility]. Please follow written instructions given to you at your visit today.  Please pick up your prep supplies at the pharmacy within the next 1-3 days. If you use inhalers (even only as needed), please bring them with you on the day of your procedure.  Please take Miralax daily.  I appreciate the opportunity to care for you. Silvano Rusk, M.D., [facility]

## 2014-09-19 ENCOUNTER — Encounter: Payer: Self-pay | Admitting: Internal Medicine

## 2014-09-19 NOTE — Addendum Note (Signed)
Addended by: Silvano Rusk E on: 09/19/2014 02:14 PM   Modules accepted: Level of Service

## 2014-09-24 ENCOUNTER — Telehealth: Payer: Self-pay | Admitting: Nurse Practitioner

## 2014-09-24 DIAGNOSIS — G894 Chronic pain syndrome: Secondary | ICD-10-CM

## 2014-09-24 NOTE — Telephone Encounter (Signed)
X was already  written to be filled on 09/19/14

## 2014-09-25 NOTE — Telephone Encounter (Signed)
[facility] had prescription on hold. Asked them to go ahead and fill it.  Insurance won't cover Novolog any longer without prior auth. They will cover Humalog Claib[facility] Rigg if you want to switch to this.

## 2014-09-30 DIAGNOSIS — Z860101 Personal history of adenomatous and serrated colon polyps: Secondary | ICD-10-CM

## 2014-09-30 DIAGNOSIS — Z86010 Personal history of colonic polyps: Secondary | ICD-10-CM

## 2014-09-30 HISTORY — DX: Personal history of colonic polyps: Z86.010

## 2014-09-30 HISTORY — DX: Personal history of adenomatous and serrated colon polyps: Z86.0101

## 2014-10-05 ENCOUNTER — Other Ambulatory Visit: Payer: Self-pay | Admitting: Nurse Practitioner

## 2014-10-05 MED ORDER — INSULIN LISPRO PROT & LISPRO (75-25 MIX) 100 UNIT/ML KWIKPEN: 55.0000 [IU] | PEN_INJECTOR | Freq: Two times a day (BID) | SUBCUTANEOUS | Status: DC

## 2014-10-05 NOTE — Telephone Encounter (Signed)
RX switched to Humalog

## 2014-10-09 ENCOUNTER — Encounter: Payer: Self-pay | Admitting: Nurse Practitioner

## 2014-10-09 ENCOUNTER — Encounter (HOSPITAL_COMMUNITY): Payer: Self-pay | Admitting: *Deleted

## 2014-10-19 ENCOUNTER — Encounter (HOSPITAL_COMMUNITY): Payer: Self-pay | Admitting: *Deleted

## 2014-10-19 ENCOUNTER — Ambulatory Visit (HOSPITAL_COMMUNITY): Payer: Medicare Other | Admitting: Anesthesiology

## 2014-10-19 ENCOUNTER — Encounter (HOSPITAL_COMMUNITY)
Admission: RE | Disposition: A | Discharge status: Home / Self Care | Payer: Self-pay | Source: Ambulatory Visit | Attending: Internal Medicine

## 2014-10-19 ENCOUNTER — Ambulatory Visit (HOSPITAL_COMMUNITY)
Admission: RE | Admit: 2014-10-19 | Discharge: 2014-10-19 | Disposition: A | Discharge status: Home / Self Care | Payer: Medicare Other | Source: Ambulatory Visit | Attending: Internal Medicine | Admitting: Internal Medicine

## 2014-10-19 DIAGNOSIS — J449 Chronic obstructive pulmonary disease, unspecified: Secondary | ICD-10-CM | POA: Insufficient documentation

## 2014-10-19 DIAGNOSIS — R195 Other fecal abnormalities: Secondary | ICD-10-CM

## 2014-10-19 DIAGNOSIS — M8580 Other specified disorders of bone density and structure, unspecified site: Secondary | ICD-10-CM | POA: Diagnosis not present

## 2014-10-19 DIAGNOSIS — N184 Chronic kidney disease, stage 4 (severe): Secondary | ICD-10-CM | POA: Diagnosis not present

## 2014-10-19 DIAGNOSIS — Z79899 Other long term (current) drug therapy: Secondary | ICD-10-CM | POA: Diagnosis not present

## 2014-10-19 DIAGNOSIS — Z87891 Personal history of nicotine dependence: Secondary | ICD-10-CM | POA: Insufficient documentation

## 2014-10-19 DIAGNOSIS — Z6841 Body Mass Index (BMI) 40.0 and over, adult: Secondary | ICD-10-CM | POA: Diagnosis not present

## 2014-10-19 DIAGNOSIS — G4730 Sleep apnea, unspecified: Secondary | ICD-10-CM | POA: Diagnosis not present

## 2014-10-19 DIAGNOSIS — F329 Major depressive disorder, single episode, unspecified: Secondary | ICD-10-CM | POA: Insufficient documentation

## 2014-10-19 DIAGNOSIS — I129 Hypertensive chronic kidney disease with stage 1 through stage 4 chronic kidney disease, or unspecified chronic kidney disease: Secondary | ICD-10-CM | POA: Diagnosis not present

## 2014-10-19 DIAGNOSIS — E538 Deficiency of other specified B group vitamins: Secondary | ICD-10-CM | POA: Insufficient documentation

## 2014-10-19 DIAGNOSIS — Z86010 Personal history of colonic polyps: Secondary | ICD-10-CM

## 2014-10-19 DIAGNOSIS — E785 Hyperlipidemia, unspecified: Secondary | ICD-10-CM | POA: Diagnosis not present

## 2014-10-19 DIAGNOSIS — Z79891 Long term (current) use of opiate analgesic: Secondary | ICD-10-CM | POA: Diagnosis not present

## 2014-10-19 DIAGNOSIS — F419 Anxiety disorder, unspecified: Secondary | ICD-10-CM | POA: Diagnosis not present

## 2014-10-19 DIAGNOSIS — D509 Iron deficiency anemia, unspecified: Secondary | ICD-10-CM | POA: Diagnosis not present

## 2014-10-19 DIAGNOSIS — M1990 Unspecified osteoarthritis, unspecified site: Secondary | ICD-10-CM | POA: Diagnosis not present

## 2014-10-19 DIAGNOSIS — D124 Benign neoplasm of descending colon: Secondary | ICD-10-CM | POA: Insufficient documentation

## 2014-10-19 DIAGNOSIS — Z7952 Long term (current) use of systemic steroids: Secondary | ICD-10-CM | POA: Insufficient documentation

## 2014-10-19 DIAGNOSIS — D123 Benign neoplasm of transverse colon: Secondary | ICD-10-CM

## 2014-10-19 DIAGNOSIS — E119 Type 2 diabetes mellitus without complications: Secondary | ICD-10-CM | POA: Insufficient documentation

## 2014-10-19 DIAGNOSIS — Z794 Long term (current) use of insulin: Secondary | ICD-10-CM | POA: Diagnosis not present

## 2014-10-19 DIAGNOSIS — Z7951 Long term (current) use of inhaled steroids: Secondary | ICD-10-CM | POA: Diagnosis not present

## 2014-10-19 DIAGNOSIS — K921 Melena: Secondary | ICD-10-CM | POA: Diagnosis not present

## 2014-10-19 DIAGNOSIS — Z7982 Long term (current) use of aspirin: Secondary | ICD-10-CM | POA: Diagnosis not present

## 2014-10-19 DIAGNOSIS — K219 Gastro-esophageal reflux disease without esophagitis: Secondary | ICD-10-CM | POA: Diagnosis not present

## 2014-10-19 DIAGNOSIS — D649 Anemia, unspecified: Secondary | ICD-10-CM | POA: Diagnosis not present

## 2014-10-19 HISTORY — PX: ESOPHAGOGASTRODUODENOSCOPY: SHX5428

## 2014-10-19 HISTORY — DX: Personal history of colonic polyps: Z86.010

## 2014-10-19 HISTORY — PX: COLONOSCOPY: SHX5424

## 2014-10-19 LAB — GLUCOSE, CAPILLARY
Glucose-Capillary: 298 mg/dL — ABNORMAL HIGH (ref 65–99)
Glucose-Capillary: 244 mg/dL — ABNORMAL HIGH (ref 65–99)

## 2014-10-19 SURGERY — EGD (ESOPHAGOGASTRODUODENOSCOPY)
Anesthesia: Monitor Anesthesia Care

## 2014-10-19 MED ORDER — BUTAMBEN-TETRACAINE-BENZOCAINE 2-2-14 % EX AERO
INHALATION_SPRAY | CUTANEOUS | Status: DC | PRN
  Administered 2014-10-19: 2 via TOPICAL

## 2014-10-19 MED ORDER — LACTATED RINGERS IV SOLN
INTRAVENOUS | Status: DC
  Administered 2014-10-19: 1000 mL via INTRAVENOUS

## 2014-10-19 MED ORDER — SODIUM CHLORIDE 0.9 % IV SOLN: INTRAVENOUS | Status: DC

## 2014-10-19 MED ORDER — PROPOFOL 10 MG/ML IV BOLUS
INTRAVENOUS | Status: AC
  Filled 2014-10-19: qty 20

## 2014-10-19 MED ORDER — INSULIN ASPART 100 UNIT/ML ~~LOC~~ SOLN
10.0000 [IU] | Freq: Once | SUBCUTANEOUS | Status: AC
  Administered 2014-10-19: 10 [IU] via SUBCUTANEOUS
  Filled 2014-10-19: qty 0.1

## 2014-10-19 MED ORDER — PROPOFOL 10 MG/ML IV BOLUS
INTRAVENOUS | Status: DC | PRN
  Administered 2014-10-19: 20 mg via INTRAVENOUS
  Administered 2014-10-19: 10 mg via INTRAVENOUS
  Administered 2014-10-19: 30 mg via INTRAVENOUS
  Administered 2014-10-19 (×2): 20 mg via INTRAVENOUS
  Administered 2014-10-19 (×2): 10 mg via INTRAVENOUS
  Administered 2014-10-19 (×2): 20 mg via INTRAVENOUS
  Administered 2014-10-19: 10 mg via INTRAVENOUS
  Administered 2014-10-19 (×3): 20 mg via INTRAVENOUS
  Administered 2014-10-19: 40 mg via INTRAVENOUS
  Administered 2014-10-19: 30 mg via INTRAVENOUS

## 2014-10-19 NOTE — H&P (Signed)
[facility] History and Physical   Primary Care Physician:  Chevis Pretty, FNP   Reason for Procedure:  Heme + and iron-deficiency anemia  Plan:    EGD and colonoscopy - The risks and benefits as well as alternatives of endoscopic procedure(s) have been discussed and reviewed. All questions answered. The patient agrees to proceed.   HPI: Anna [name] is a 80 y.o. female with heme + iron def anemia.  Hx below.  This is an elderly chronically ill ww here with family members - denies any rectal bleeding or hematochezia but has been found to be heme + on home guaiac stool card testing. She also has a new iron deficiency anemia. Diet does include iron. She has chronic constipation and is asking what she can use to improve defecation - constipation made worse by ferrous sulfate that has been started.Hemorrhoids protrude, ? Tags, but do not bleed. Uses omeprazole with relief of heartburn. Has chronic kidney disease and has seen nephrology - Dr. Lorrene Reid TIBC 495 and iron sat 10% 08/26/14.Hgb 9.5 MCV 81 that day. GFR 21.  GI ROS is otherwise negative  She has refused to undergo a screening colonoscopy when recommended in past.  Past Medical History  Diagnosis Date  . Hypertension   . COPD (chronic obstructive pulmonary disease)   . Diabetes mellitus without complication   . Neuromuscular disorder   . Depression   . Osteopenia   . Arthritis   . Anxiety disorder   . GERD (gastroesophageal reflux disease)   . Hyperlipidemia   . Obesity   . Vitamin B12 deficiency   . Chronic kidney disease (CKD), stage IV (severe)   . Sleep apnea     no cpap use  . Shortness of breath dyspnea   . Anemia     iron infusion- x2 recent , last 5'16    Past Surgical History  Procedure Laterality Date  . Breast surgery  April 1996    needle biopsy   . Tubal ligation    . Cataract extraction, bilateral Bilateral     Prior to Admission medications   Medication Sig Start Date End  Date Taking? Authorizing Provider  amLODipine (NORVASC) 5 MG tablet Take 1 tablet (5 mg total) by mouth daily. 05/21/14  Yes Mary-Margaret Hassell Done, FNP  aspirin 81 MG tablet Take 81 mg by mouth daily.     Yes Historical Provider, MD  benazepril (LOTENSIN) 40 MG tablet Take 40 mg by mouth daily.   Yes Historical Provider, MD  budesonide-formoterol (SYMBICORT) 80-4.5 MCG/ACT inhaler Inhale 1 puff into the lungs 2 (two) times daily. 05/21/14  Yes Mary-Margaret Hassell Done, FNP  carvedilol (COREG) 25 MG tablet TAKE 1 TABLET BY MOUTH TWICE DAILY WITH A MEAL 05/21/14  Yes Mary-Margaret Hassell Done, FNP  citalopram (CELEXA) 20 MG tablet Take 1 tablet (20 mg total) by mouth daily. 05/21/14  Yes Mary-Margaret Hassell Done, FNP  cloNIDine (CATAPRES) 0.2 MG tablet Take 1 tablet (0.2 mg total) by mouth 2 (two) times daily. 05/21/14  Yes Mary-Margaret Hassell Done, FNP  fenofibrate (TRICOR) 145 MG tablet Take 1 tablet (145 mg total) by mouth daily. 05/21/14  Yes Mary-Margaret Hassell Done, FNP  furosemide (LASIX) 40 MG tablet 1 in AM and 1/2 at lunch 08/21/14  Yes Mary-Margaret Hassell Done, FNP  gabapentin (NEURONTIN) 300 MG capsule TAKE 1 CAPSULE BY MOUTH 4 TIMES A DAY 05/21/14  Yes Mary-Margaret Hassell Done, FNP  glimepiride (AMARYL) 4 MG tablet TAKE 1 TABLET BY MOUTH EVERY DAY BEFORE BREAKFAST 05/21/14  Yes Mary-Margaret Hassell Done, FNP  hydrocortisone (CORTEF) 20 MG tablet Magic Mouth wash.  TAKE 1 TEASPOONFUL BY MOUTH FOUR TIMES A DAY (SHAKE WELL ANDREFRIGERATE)   Yes Mary-Margaret Hassell Done, FNP  Insulin Lispro Prot & Lispro (HUMALOG 75/25 MIX) (75-25) 100 UNIT/ML Kwikpen Inject 55 Units into the skin 2 (two) times daily. 10/05/14  Yes Sharion Balloon, FNP  omeprazole (PRILOSEC) 40 MG capsule TAKE 1 CAPSULE BY MOUTH EVERY DAY 05/21/14  Yes Mary-Margaret Hassell Done, FNP  oxyCODONE-acetaminophen (PERCOCET) 10-325 MG per tablet Take 1 tablet by mouth 2 (two) times daily. 08/21/14  Yes Mary-Margaret Hassell Done, FNP  rosuvastatin (CRESTOR) 20 MG tablet Take 1 tablet (20 mg  total) by mouth daily. 05/21/14  Yes Mary-Margaret Hassell Done, FNP  ULORIC 40 MG tablet TAKE 1 TABLET BY MOUTH ONCE DAILY 09/17/14  Yes Mary-Margaret Hassell Done, FNP  B-D UF III MINI PEN NEEDLES 31G X 5 MM MISC USE TWICE A DAY TO INJECT NOVOLOG MIX 70/30 INSULIN 03/11/13   Mary-Margaret Hassell Done, FNP  EASY TOUCH INSULIN SYRINGE 31G X 5/16" 1 ML MISC USE TO INJECT INSULIN TWICE DAILY 12/10/12   Mary-Margaret Hassell Done, FNP  EASY TOUCH PEN NEEDLES 31G X 8 MM MISC USE TWICE DAILY 02/04/14   Mary-Margaret Hassell Done, FNP  glucose blood test strip Test 2-3X a day and prn  Dx 250.02 05/08/13   Mary-Margaret Hassell Done, FNP    Current Facility-Administered Medications  Medication Dose Route Frequency Provider Last Rate Last Dose  . 0.9 %  sodium chloride infusion   Intravenous Continuous Gatha Mayer, MD      . 0.9 %  sodium chloride infusion   Intravenous Continuous Gatha Mayer, MD      . lactated ringers infusion   Intravenous Continuous Gatha Mayer, MD 10 mL/hr at 10/19/14 0931 1,000 mL at 10/19/14 0931    Allergies as of 09/17/2014 - Review Complete 09/17/2014  Allergen Reaction Noted  . Niaspan [niacin er] Other (See Comments) 07/16/2010    Family History  Problem Relation Age of Onset  . Hypertension Mother   . Hypertension Father   . Diabetes Father   . COPD Father   . Diabetes Sister     x 2  . Colon cancer Neg Hx   . Colon polyps Neg Hx   . Skin cancer Sister   . Testicular cancer Brother   . Esophageal cancer Neg Hx     History   Social History  . Marital Status: Widowed    Spouse Name: N/A  . Number of Children: 11  . Years of Education: N/A   Occupational History  . Retired    Social History Main Topics  . Smoking status: Former Smoker    Types: Cigarettes    Quit date: 05/01/2010  . Smokeless tobacco: Not on file  . Alcohol Use: No  . Drug Use: No  . Sexual Activity: Not on file   Other Topics Concern  . Not on file   Social History Narrative   Widowed 1 son and 1  daughter   Retired   No caffeine    Review of Systems: Positive for anxiety , joint and back pain, depression, fatigue, insomnia, pedal edema, urine leakage All other review of systems negative except as mentioned in the HPI.  Physical Exam: Vital signs in last 24 hours: Temp:  [98.4 F (36.9 C)] 98.4 F (36.9 C) (06/20 0923) Resp:  [11] 11 (06/20 0923) BP: (194)/(58) 194/58 mmHg (06/20 0925) SpO2:  [94 %] 94 % (06/20 0923) Weight:  [240 lb (  567.209 kg)] 240 lb (108.863 kg) (06/20 0923)   General:   Alert,  Well-developed, well-nourished, pleasant and cooperative in NAD Lungs:  Clear throughout to auscultation.   Heart:  Regular rate and rhythm; no murmurs, clicks, rubs,  or gallops. Abdomen:  Soft, nontender and nondistended. Normal bowel sounds.   Neuro/Psych:  Alert and cooperative. Normal mood and affect. A and O x 3   @Carl  Simonne Maffucci, MD, [facility] [phone] (pager) 10/19/2014 9:55 AM@

## 2014-10-19 NOTE — Discharge Instructions (Addendum)
° ° °  I found and removed 5 small colon polyps - all look benign. Will get them checked and let you know.  You also have a condition called diverticulosis - common and not usually a problem. Please read the handout provided.  The upper endoscopy was normal.  No signs of cancer.  i recommend you continue with iron treatment but do not think you need any other testing from me.  I appreciate the opportunity to care for you. Gatha Mayer, MD, FACG   YOU HAD AN ENDOSCOPIC PROCEDURE TODAY: Refer to the procedure report and other information in the discharge instructions given to you for any specific questions about what was found during the examination. If this information does not answer your questions, please call Dr. Celesta Aver office at [phone] to clarify.   YOU SHOULD EXPECT: Some feelings of bloating in the abdomen. Passage of more gas than usual. Walking can help get rid of the air that was put into your GI tract during the procedure and reduce the bloating. If you had a lower endoscopy (such as a colonoscopy or flexible sigmoidoscopy) you may notice spotting of blood in your stool or on the toilet paper. Some abdominal soreness may be present for a day or two, also.  DIET: Your first meal following the procedure should be a light meal and then it is ok to progress to your normal diet. A half-sandwich or bowl of soup is an example of a good first meal. Heavy or fried foods are harder to digest and may make you feel nauseous or bloated. Drink plenty of fluids but you should avoid alcoholic beverages for 24 hours.   ACTIVITY: Your care partner should take you home directly after the procedure. You should plan to take it easy, moving slowly for the rest of the day. You can resume normal activity the day after the procedure however YOU SHOULD NOT DRIVE, use power tools, machinery or perform tasks that involve climbing or major physical exertion for 24 hours (because of the sedation  medicines used during the test).   SYMPTOMS TO REPORT IMMEDIATELY: A gastroenterologist can be reached at any hour. Please call [phone]  for any of the following symptoms:  Following lower endoscopy (colonoscopy, flexible sigmoidoscopy) Excessive amounts of blood in the stool  Significant tenderness, worsening of abdominal pains  Swelling of the abdomen that is new, acute  Fever of 100 or higher  Following upper endoscopy (EGD, EUS, ERCP, esophageal dilation) Vomiting of blood or coffee ground material  New, significant abdominal pain  New, significant chest pain or pain under the shoulder blades  Painful or persistently difficult swallowing  New shortness of breath  Black, tarry-looking or red, bloody stools  FOLLOW UP:  If any biopsies were taken you will be contacted by phone or by letter within the next 1-3 weeks. Call [phone]  if you have not heard about the biopsies in 3 weeks.  Please also call with any specific questions about appointments or follow up tests.

## 2014-10-19 NOTE — Op Note (Signed)
[facility] [place], [address]   COLONOSCOPY PROCEDURE REPORT  PATIENT: Anna [name], Anna [name]  MR#: [id number] BIRTHDATE: 1934/06/21 , 39  yrs. old GENDER: female ENDOSCOPIST: Gatha Mayer, MD, [facility] PROCEDURE DATE:  10/19/2014 PROCEDURE:   Colonoscopy, diagnostic and Colonoscopy with snare polypectomy First Screening Colonoscopy - Avg.  risk and is 50 yrs.  old or older - No.  Prior Negative Screening - Now for repeat screening. N/A  History of Adenoma - Now for follow-up colonoscopy & has been > or = to 3 yrs.  N/A  Polyps removed today? Yes ASA CLASS:   Class III INDICATIONS:Unexplained iron deficiency anemia and Patient is not applicable for Colorectal Neoplasm Risk Assessment for this procedure. MEDICATIONS: Monitored anesthesia care, Residual sedation present, and Per Anesthesia  DESCRIPTION OF PROCEDURE:   After the risks benefits and alternatives of the procedure were thoroughly explained, informed consent was obtained.  The digital rectal exam revealed external hemorrhoids.   The Pentax Adult Colonscope [id number]  endoscope was introduced through the anus and advanced to the cecum, which was identified by both the appendix and ileocecal valve. No adverse events experienced.   The quality of the prep was adequate (MiraLax was used)  The instrument was then slowly withdrawn as the colon was fully examined. Estimated blood loss is zero unless otherwise noted in this procedure report.   COLON FINDINGS: Five sessile polyps ranging from 3 to 73mm in size were found in the transverse colon and descending colon. Polypectomies were performed with a cold snare.  The resection was complete, the polyp tissue was completely retrieved and sent to histology.  Retroflexed views revealed no abnormalities. The time to cecum = 2.3 Withdrawal time = 17.5   The scope was withdrawn and the procedure completed. COMPLICATIONS: There were no immediate  complications.  ENDOSCOPIC IMPRESSION: Five sessile polyps ranging from 3 to 71mm in size were found in the transverse colon and descending colon; polypectomies were performed with a cold snare  RECOMMENDATIONS: Continue iron supplements No further GI w/u Billey Gosling notify re: pathology but do not anticipate recommending routione repeat colonoscopy  eSigned:  Gatha Mayer, MD, [facility] 10/19/2014 11:14 AM   cc: Chevis Pretty, NP, Jamal Maes, MD and The Patient

## 2014-10-19 NOTE — Op Note (Signed)
[facility] [place], [address]   ENDOSCOPY PROCEDURE REPORT  PATIENT: Anna, [name]  MR#: [id number] BIRTHDATE: 19-Jul-1934 , 80  yrs. old GENDER: female ENDOSCOPIST: Gatha Mayer, MD, [facility] PROCEDURE DATE:  10/19/2014 PROCEDURE:  EGD, diagnostic ASA CLASS:     Class III INDICATIONS:  hemocult positive stool and iron deficiency anemia. MEDICATIONS: Per Anesthesia and Monitored anesthesia care TOPICAL ANESTHETIC: none  DESCRIPTION OF PROCEDURE: After the risks benefits and alternatives of the procedure were thoroughly explained, informed consent was obtained.  The endoscope [id number] endoscope was introduced through the mouth and advanced to the second portion of the duodenum , Without limitations.  The instrument was slowly withdrawn as the mucosa was fully examined.      EXAM: The esophagus and gastroesophageal junction were completely normal in appearance.  The stomach was entered and closely examined.The antrum, angularis, and lesser curvature were well visualized, including a retroflexed view of the cardia and fundus. The stomach wall was normally distensable.  The scope passed easily through the pylorus into the duodenum.  Retroflexed views revealed no abnormalities.     The scope was then withdrawn from the patient and the procedure completed.  COMPLICATIONS: There were no immediate complications.  ENDOSCOPIC IMPRESSION: Normal appearing esophagus and GE junction, the stomach was well visualized and normal in appearance, normal appearing duodenum  RECOMMENDATIONS: Proceed with a Colonoscopy.  REPEAT EXAM:  eSigned:  Gatha Mayer, MD, [facility] 10/19/2014 11:10 AM    CC: Fuller Mandril, NP, Jamal Maes, MD and The Patient

## 2014-10-19 NOTE — Anesthesia Preprocedure Evaluation (Addendum)
Anesthesia Evaluation  Patient identified by MRN, date of birth, ID band Patient awake    Reviewed: Allergy & Precautions, NPO status , Patient's Chart, lab work & pertinent test results  Airway Mallampati: II  TM Distance: >3 FB Neck ROM: Full    Dental no notable dental hx.    Pulmonary sleep apnea , COPDformer smoker,  breath sounds clear to auscultation  Pulmonary exam normal       Cardiovascular hypertension, Pt. on medications Normal cardiovascular examRhythm:Regular Rate:Normal     Neuro/Psych negative neurological ROS  negative psych ROS   GI/Hepatic negative GI ROS, Neg liver ROS,   Endo/Other  diabetes, Type 2, Insulin Dependent, Oral Hypoglycemic AgentsMorbid obesity  Renal/GU Renal InsufficiencyRenal disease  negative genitourinary   Musculoskeletal negative musculoskeletal ROS (+)   Abdominal   Peds negative pediatric ROS (+)  Hematology negative hematology ROS (+)   Anesthesia Other Findings   Reproductive/Obstetrics negative OB ROS                            Anesthesia Physical Anesthesia Plan  ASA: III  Anesthesia Plan: MAC   Post-op Pain Management:    Induction:   Airway Management Planned: Natural Airway  Additional Equipment:   Intra-op Plan:   Post-operative Plan:   Informed Consent: I have reviewed the patients History and Physical, chart, labs and discussed the procedure including the risks, benefits and alternatives for the proposed anesthesia with the patient or authorized representative who has indicated his/her understanding and acceptance.   Dental advisory given  Plan Discussed with: CRNA  Anesthesia Plan Comments:         Anesthesia Quick Evaluation

## 2014-10-19 NOTE — Anesthesia Postprocedure Evaluation (Signed)
  Anesthesia Post-op Note  Patient: Anna [name]  Procedure(s) Performed: Procedure(s) (LRB): ESOPHAGOGASTRODUODENOSCOPY (EGD) (N/A) COLONOSCOPY (N/A)  Patient Location: PACU  Anesthesia Type: MAC  Level of Consciousness: awake and alert   Airway and Oxygen Therapy: Patient Spontanous Breathing  Post-op Pain: mild  Post-op Assessment: Post-op Vital signs reviewed, Patient's Cardiovascular Status Stable, Respiratory Function Stable, Patent Airway and No signs of Nausea or vomiting  Last Vitals:  Filed Vitals:   10/19/14 1120  BP:   Pulse: 62  Temp:   Resp: 17    Post-op Vital Signs: stable   Complications: No apparent anesthesia complications

## 2014-10-19 NOTE — Transfer of Care (Signed)
Immediate Anesthesia Transfer of Care Note  Patient: Anna [name]  Procedure(s) Performed: Procedure(s): ESOPHAGOGASTRODUODENOSCOPY (EGD) (N/A) COLONOSCOPY (N/A)  Patient Location: PACU  Anesthesia Type:MAC  Level of Consciousness: sedated  Airway & Oxygen Therapy: Patient Spontanous Breathing and Patient connected to nasal cannula oxygen  Post-op Assessment: Report given to RN and Post -op Vital signs reviewed and stable  Post vital signs: Reviewed and stable  Last Vitals:  Filed Vitals:   10/19/14 1018  BP: 183/75  Pulse: 72  Temp:   Resp: 14    Complications: No apparent anesthesia complications

## 2014-10-20 ENCOUNTER — Encounter (HOSPITAL_COMMUNITY): Payer: Self-pay | Admitting: Internal Medicine

## 2014-10-21 ENCOUNTER — Ambulatory Visit: Admit: 2014-10-21 | Payer: Self-pay | Admitting: Internal Medicine

## 2014-10-21 SURGERY — EGD (ESOPHAGOGASTRODUODENOSCOPY)
Anesthesia: Monitor Anesthesia Care

## 2014-10-23 ENCOUNTER — Telehealth: Payer: Self-pay | Admitting: Nurse Practitioner

## 2014-10-23 DIAGNOSIS — G894 Chronic pain syndrome: Secondary | ICD-10-CM

## 2014-10-26 ENCOUNTER — Other Ambulatory Visit: Payer: Self-pay | Admitting: Nurse Practitioner

## 2014-10-26 MED ORDER — OXYCODONE-ACETAMINOPHEN 10-325 MG PO TABS: 1.0000 | ORAL_TABLET | Freq: Two times a day (BID) | ORAL | Status: DC

## 2014-10-26 NOTE — Telephone Encounter (Signed)
Oxycodone rx ready for pick up  

## 2014-10-26 NOTE — Telephone Encounter (Signed)
Pt aware written Rx is ready at front desk for pickup

## 2014-10-28 ENCOUNTER — Encounter: Payer: Self-pay | Admitting: Internal Medicine

## 2014-10-28 ENCOUNTER — Encounter (HOSPITAL_COMMUNITY): Payer: Self-pay | Admitting: Internal Medicine

## 2014-10-28 NOTE — Progress Notes (Signed)
Quick Note:  5 small adenomas No recall due to age ______

## 2014-11-20 ENCOUNTER — Telehealth: Payer: Self-pay | Admitting: Internal Medicine

## 2014-11-20 NOTE — Telephone Encounter (Signed)
Patient is having trouble re-establishing her normal bowel habits after a colonoscopy on 10/19/14.  She is advised to increase her fluid intake, a high fiber diet, and Miralax BID (currently taking qd).  They will call back for any additional questions or concerns

## 2014-11-26 ENCOUNTER — Encounter: Payer: Self-pay | Admitting: Nurse Practitioner

## 2014-11-26 ENCOUNTER — Other Ambulatory Visit: Payer: Self-pay | Admitting: Family

## 2014-11-26 ENCOUNTER — Telehealth: Payer: Self-pay | Admitting: Nurse Practitioner

## 2014-11-26 ENCOUNTER — Ambulatory Visit (INDEPENDENT_AMBULATORY_CARE_PROVIDER_SITE_OTHER): Payer: Medicare Other | Admitting: Nurse Practitioner

## 2014-11-26 VITALS — BP 117/49 | HR 64 | Temp 97.1°F | Ht 64.0 in | Wt 236.0 lb

## 2014-11-26 DIAGNOSIS — E0843 Diabetes mellitus due to underlying condition with diabetic autonomic (poly)neuropathy: Secondary | ICD-10-CM | POA: Diagnosis not present

## 2014-11-26 DIAGNOSIS — E669 Obesity, unspecified: Secondary | ICD-10-CM | POA: Diagnosis not present

## 2014-11-26 DIAGNOSIS — J410 Simple chronic bronchitis: Secondary | ICD-10-CM | POA: Diagnosis not present

## 2014-11-26 DIAGNOSIS — E119 Type 2 diabetes mellitus without complications: Secondary | ICD-10-CM | POA: Diagnosis not present

## 2014-11-26 DIAGNOSIS — E785 Hyperlipidemia, unspecified: Secondary | ICD-10-CM

## 2014-11-26 DIAGNOSIS — K219 Gastro-esophageal reflux disease without esophagitis: Secondary | ICD-10-CM | POA: Diagnosis not present

## 2014-11-26 DIAGNOSIS — R609 Edema, unspecified: Secondary | ICD-10-CM | POA: Diagnosis not present

## 2014-11-26 DIAGNOSIS — D649 Anemia, unspecified: Secondary | ICD-10-CM | POA: Diagnosis not present

## 2014-11-26 DIAGNOSIS — I159 Secondary hypertension, unspecified: Secondary | ICD-10-CM

## 2014-11-26 DIAGNOSIS — I10 Essential (primary) hypertension: Secondary | ICD-10-CM

## 2014-11-26 DIAGNOSIS — M159 Polyosteoarthritis, unspecified: Secondary | ICD-10-CM

## 2014-11-26 DIAGNOSIS — M150 Primary generalized (osteo)arthritis: Secondary | ICD-10-CM

## 2014-11-26 DIAGNOSIS — D509 Iron deficiency anemia, unspecified: Secondary | ICD-10-CM | POA: Diagnosis not present

## 2014-11-26 DIAGNOSIS — G2581 Restless legs syndrome: Secondary | ICD-10-CM

## 2014-11-26 DIAGNOSIS — F329 Major depressive disorder, single episode, unspecified: Secondary | ICD-10-CM

## 2014-11-26 DIAGNOSIS — Z794 Long term (current) use of insulin: Secondary | ICD-10-CM | POA: Diagnosis not present

## 2014-11-26 DIAGNOSIS — F32A Depression, unspecified: Secondary | ICD-10-CM

## 2014-11-26 DIAGNOSIS — G894 Chronic pain syndrome: Secondary | ICD-10-CM

## 2014-11-26 LAB — POCT CBC
Granulocyte percent: 72.6 %G (ref 37–80)
HCT, POC: 34 % — AB (ref 37.7–47.9)
HEMOGLOBIN: 11.1 g/dL — AB (ref 12.2–16.2)
LYMPH, POC: 1.1 (ref 0.6–3.4)
MCH: 29.1 pg (ref 27–31.2)
MCHC: 32.5 g/dL (ref 31.8–35.4)
MCV: 89.5 fL (ref 80–97)
MPV: 8.2 fL (ref 0–99.8)
POC Granulocyte: 4 (ref 2–6.9)
POC LYMPH PERCENT: 19.2 %L (ref 10–50)
Platelet Count, POC: 210 10*3/uL (ref 142–424)
RBC: 3.8 M/uL — AB (ref 4.04–5.48)
RDW, POC: 16.1 %
WBC: 5.5 10*3/uL (ref 4.6–10.2)

## 2014-11-26 LAB — GLUCOSE, POCT (MANUAL RESULT ENTRY): POC GLUCOSE: 349 mg/dL — AB (ref 70–99)

## 2014-11-26 LAB — POCT GLYCOSYLATED HEMOGLOBIN (HGB A1C): Hemoglobin A1C: 7.8

## 2014-11-26 MED ORDER — INSULIN GLARGINE 300 UNIT/ML ~~LOC~~ SOPN: 50.0000 [IU] | PEN_INJECTOR | Freq: Every day | SUBCUTANEOUS | Status: DC

## 2014-11-26 MED ORDER — OMEPRAZOLE 40 MG PO CPDR: DELAYED_RELEASE_CAPSULE | ORAL | Status: DC

## 2014-11-26 MED ORDER — FENOFIBRATE 145 MG PO TABS: 145.0000 mg | ORAL_TABLET | Freq: Every day | ORAL | Status: DC

## 2014-11-26 MED ORDER — OXYCODONE-ACETAMINOPHEN 10-325 MG PO TABS: 1.0000 | ORAL_TABLET | Freq: Two times a day (BID) | ORAL | Status: DC

## 2014-11-26 MED ORDER — GLIMEPIRIDE 4 MG PO TABS: ORAL_TABLET | ORAL | Status: DC

## 2014-11-26 MED ORDER — BENAZEPRIL HCL 40 MG PO TABS: 40.0000 mg | ORAL_TABLET | Freq: Every day | ORAL | Status: DC

## 2014-11-26 MED ORDER — FEBUXOSTAT 40 MG PO TABS: 40.0000 mg | ORAL_TABLET | Freq: Every day | ORAL | Status: AC

## 2014-11-26 MED ORDER — CLONIDINE HCL 0.2 MG PO TABS
0.2000 mg | ORAL_TABLET | Freq: Two times a day (BID) | ORAL | Status: DC
Start: 2014-11-26 — End: 2014-12-31

## 2014-11-26 MED ORDER — AMLODIPINE BESYLATE 5 MG PO TABS: 5.0000 mg | ORAL_TABLET | Freq: Every day | ORAL | Status: DC

## 2014-11-26 MED ORDER — ROSUVASTATIN CALCIUM 20 MG PO TABS: 20.0000 mg | ORAL_TABLET | Freq: Every day | ORAL | Status: DC

## 2014-11-26 MED ORDER — GABAPENTIN 300 MG PO CAPS: ORAL_CAPSULE | ORAL | Status: DC

## 2014-11-26 MED ORDER — BUDESONIDE-FORMOTEROL FUMARATE 80-4.5 MCG/ACT IN AERO: 1.0000 | INHALATION_SPRAY | Freq: Two times a day (BID) | RESPIRATORY_TRACT | Status: AC

## 2014-11-26 MED ORDER — FUROSEMIDE 40 MG PO TABS: ORAL_TABLET | ORAL | Status: DC

## 2014-11-26 MED ORDER — CITALOPRAM HYDROBROMIDE 20 MG PO TABS: 20.0000 mg | ORAL_TABLET | Freq: Every day | ORAL | Status: DC

## 2014-11-26 MED ORDER — CARVEDILOL 25 MG PO TABS: ORAL_TABLET | ORAL | Status: DC

## 2014-11-26 MED ORDER — INSULIN ASPART 100 UNIT/ML FLEXPEN: PEN_INJECTOR | SUBCUTANEOUS | Status: DC

## 2014-11-26 NOTE — Addendum Note (Signed)
Addended by: Earlene Plater on: 11/26/2014 11:13 AM   Modules accepted: Orders

## 2014-11-26 NOTE — Telephone Encounter (Signed)
Blood sugar was 273 1.5  Hours after eating. We had given her 10 u of novalog in office earlier today when blood sugar was 350. Faminly was told to give patient novalog 10u now and check blood sugar in morning fasting and call with results. We are trying ti switch patient from humalog 75/25 to toujeo for better hgba1c control.

## 2014-11-26 NOTE — Addendum Note (Signed)
Addended by: Chevis Pretty on: 11/26/2014 11:15 AM   Modules accepted: Orders

## 2014-11-26 NOTE — Patient Instructions (Signed)

## 2014-11-26 NOTE — Progress Notes (Signed)
Subjective:    Patient ID: Anna [name], female    DOB: 02-04-1935, 80 y.o.   MRN: [id number]  Patient here today for follow up of chronic medical problems. Ran out of insulin yesterday morning and blood sugar was over 400 last night.   Hypertension This is a chronic problem. The current episode started more than 1 year ago. The problem is controlled. Associated symptoms include shortness of breath. Pertinent negatives include no chest pain or palpitations. Risk factors for coronary artery disease include diabetes mellitus, dyslipidemia, obesity, post-menopausal state and sedentary lifestyle. Past treatments include beta blockers, central alpha agonists and calcium channel blockers. The current treatment provides moderate improvement. Compliance problems include diet and exercise.  Hypertensive end-organ damage includes CAD/MI.  Hyperlipidemia This is a chronic problem. The current episode started more than 1 year ago. The problem is uncontrolled. Recent lipid tests were reviewed and are normal. Exacerbating diseases include diabetes and obesity. She has no history of hypothyroidism. Associated symptoms include shortness of breath. Pertinent negatives include no chest pain or myalgias. Current antihyperlipidemic treatment includes statins and fibric acid derivatives. The current treatment provides moderate improvement of lipids. Compliance problems include adherence to diet and adherence to exercise.  Risk factors for coronary artery disease include diabetes mellitus, dyslipidemia, hypertension and post-menopausal.  Diabetes She presents for her follow-up diabetic visit. She has type 2 diabetes mellitus. No MedicAlert identification noted. Her disease course has been fluctuating. Hypoglycemia symptoms include dizziness. Associated symptoms include fatigue and weakness. Pertinent negatives for diabetes include no chest pain and no visual change. Diabetic complications include heart disease. Risk  factors for coronary artery disease include diabetes mellitus, dyslipidemia, hypertension and post-menopausal. Current diabetic treatment includes insulin injections and oral agent (monotherapy). She is compliant with treatment some of the time. Her weight is stable. When asked about meal planning, she reported none. She has not had a previous visit with a dietitian. She never participates in exercise. Her breakfast blood glucose is taken between 8-9 am. Her breakfast blood glucose range is generally 130-140 mg/dl. Her overall blood glucose range is 130-140 mg/dl. An ACE inhibitor/angiotensin II receptor blocker is not being taken. She does not see a podiatrist.Eye exam is not current.  Gastrophageal Reflux She reports no chest pain, no coughing or no nausea. Associated symptoms include fatigue.  Osteoarthritis  Pt takes percocet 1 tab BID for pain knee, and back pain Peripheral Edema Pt takes lasix daily-Pt still has edema in lower extremities. COPD Pt takes Symbicort daily-Pt feels like it is well controlled Peripheral Neuropathy Pt takes neurontin 300 mg TID-Pt does not fell like this is helping. Still  Has lots of nerve pain in her feet- seems to be worse at night.  Review of Systems  Constitutional: Positive for fatigue.  Respiratory: Positive for shortness of breath. Negative for cough.   Cardiovascular: Negative for chest pain and palpitations.  Gastrointestinal: Negative for nausea.  Musculoskeletal: Negative for myalgias.  Neurological: Positive for dizziness and weakness.  All other systems reviewed and are negative.      Objective:   Physical Exam  Constitutional: She is oriented to person, place, and time. She appears well-developed and well-nourished.  HENT:  Head: Normocephalic.  Nose: Nose normal.  Mouth/Throat: Oropharynx is clear and moist.  Eyes: Pupils are equal, round, and reactive to light.  Neck: Normal range of motion. Neck supple.  Cardiovascular: Normal rate,  regular rhythm and intact distal pulses.   Murmur heard. Pulmonary/Chest: Effort normal and breath sounds normal.  Abdominal: Soft. Bowel sounds are normal. She exhibits no distension. There is no tenderness.  Musculoskeletal: Normal range of motion. She exhibits edema (2+ in LE).  Erythematous medial side of right great toe- very tender to touch.  Neurological: She is alert and oriented to person, place, and time.  Skin: Skin is warm and dry.  Psychiatric: She has a normal mood and affect. Her behavior is normal. Judgment and thought content normal.  Vitals reviewed.    BP 117/49 mmHg  Pulse 64  Temp(Src) 97.1 F (36.2 C) (Oral)  Ht 5' 4"  (1.626 m)  Wt 236 lb (107.049 kg)  BMI 40.49 kg/m2   Results for orders placed or performed in visit on 11/26/14  POCT glycosylated hemoglobin (Hb A1C)  Result Value Ref Range   Hemoglobin A1C 7.8   POCT CBC  Result Value Ref Range   WBC 5.5 4.6 - 10.2 K/uL   Lymph, poc 1.1 0.6 - 3.4   POC LYMPH PERCENT 19.2 10 - 50 %L   POC Granulocyte 4.0 2 - 6.9   Granulocyte percent 72.6 37 - 80 %G   RBC 3.80 (A) 4.04 - 5.48 M/uL   Hemoglobin 11.1 (A) 12.2 - 16.2 g/dL   HCT, POC 34.0 (A) 37.7 - 47.9 %   MCV 89.5 80 - 97 fL   MCH, POC 29.1 27 - 31.2 pg   MCHC 32.5 31.8 - 35.4 g/dL   RDW, POC 16.1 %   Platelet Count, POC 210 142 - 424 K/uL   MPV 8.2 0 - 99.8 fL          Assessment & Plan:   1. Diabetes mellitus type 2, insulin dependent Changed patien from humalog 75/25 to toujeo daily Gave a novolog pen sith sliding scale instructions Family will call and let me know how blood sugars are - POCT glycosylated hemoglobin (Hb A1C) - glimepiride (AMARYL) 4 MG tablet; TAKE 1 TABLET BY MOUTH EVERY DAY BEFORE BREAKFAST  Dispense: 30 tablet; Refill: 5 - insulin aspart (NOVOLOG FLEXPEN) 100 UNIT/ML FlexPen; Blood sugar 250-300-6u  Greater then 300 8u  Dispense: 15 mL; Refill: 11 - Insulin Glargine (TOUJEO SOLOSTAR) 300 UNIT/ML SOPN; Inject 50 Units  into the skin at bedtime.  Dispense: 3 mL; Refill: 0  2. Secondary hypertension, unspecified - CMP14+EGFR  3. Hyperlipidemia Low fat diet - Lipid panel - fenofibrate (TRICOR) 145 MG tablet; Take 1 tablet (145 mg total) by mouth daily.  Dispense: 30 tablet; Refill: 5 - rosuvastatin (CRESTOR) 20 MG tablet; Take 1 tablet (20 mg total) by mouth daily.  Dispense: 30 tablet; Refill: 5  4. Anemia, unspecified anemia type - POCT CBC  5. Simple chronic bronchitis - budesonide-formoterol (SYMBICORT) 80-4.5 MCG/ACT inhaler; Inhale 1 puff into the lungs 2 (two) times daily.  Dispense: 1 Inhaler; Refill: 5  6. Diabetic autonomic neuropathy associated with diabetes mellitus due to underlying condition Do not go barefooted - gabapentin (NEURONTIN) 300 MG capsule; TAKE 1 CAPSULE BY MOUTH 4 TIMES A DAY  Dispense: 120 capsule; Refill: 5  7. Gastroesophageal reflux disease without esophagitis Avoid spicy foods Do not eat 2 hours prior to bedtime - omeprazole (PRILOSEC) 40 MG capsule; TAKE 1 CAPSULE BY MOUTH EVERY DAY  Dispense: 30 capsule; Refill: 5  8. Primary osteoarthritis involving multiple joints  9. RLS (restless legs syndrome)  10. Peripheral edema Elevate legs when sitting - furosemide (LASIX) 40 MG tablet; 1 in AM and 1/2 at lunch  Dispense: 45 tablet; Refill: 5  11. Obesity Discussed  diet and exercise for person with BMI >25 Will recheck weight in 3-6 months 12. Anemia, iron deficiency  13. Essential hypertension Do not add salt to diet - benazepril (LOTENSIN) 40 MG tablet; Take 1 tablet (40 mg total) by mouth daily.  Dispense: 30 tablet; Refill: 5 - carvedilol (COREG) 25 MG tablet; TAKE 1 TABLET BY MOUTH TWICE DAILY WITH A MEAL  Dispense: 60 tablet; Refill: 5 - cloNIDine (CATAPRES) 0.2 MG tablet; Take 1 tablet (0.2 mg total) by mouth 2 (two) times daily.  Dispense: 60 tablet; Refill: 5 - amLODipine (NORVASC) 5 MG tablet; Take 1 tablet (5 mg total) by mouth daily.  Dispense: 30  tablet; Refill: 5  14. Depression Stress amanegemnet - citalopram (CELEXA) 20 MG tablet; Take 1 tablet (20 mg total) by mouth daily.  Dispense: 30 tablet; Refill: 5  * Patient given toujeo 50 u dose at offuce *Patient given novalog 10 u while at office  Labs pending Health maintenance reviewed Diet and exercise encouraged Continue all meds Follow up  In 3 months   [place], FNP

## 2014-11-27 ENCOUNTER — Other Ambulatory Visit: Payer: Self-pay | Admitting: Nurse Practitioner

## 2014-11-27 DIAGNOSIS — Z794 Long term (current) use of insulin: Principal | ICD-10-CM

## 2014-11-27 DIAGNOSIS — E119 Type 2 diabetes mellitus without complications: Secondary | ICD-10-CM

## 2014-11-27 LAB — CMP14+EGFR
ALK PHOS: 45 IU/L (ref 39–117)
ALT: 20 IU/L (ref 0–32)
AST: 43 IU/L — ABNORMAL HIGH (ref 0–40)
Albumin/Globulin Ratio: 1.9 (ref 1.1–2.5)
Albumin: 4.2 g/dL (ref 3.5–4.8)
BILIRUBIN TOTAL: 0.4 mg/dL (ref 0.0–1.2)
BUN/Creatinine Ratio: 25 (ref 11–26)
BUN: 42 mg/dL — ABNORMAL HIGH (ref 8–27)
CO2: 25 mmol/L (ref 18–29)
Calcium: 9.5 mg/dL (ref 8.7–10.3)
Chloride: 96 mmol/L — ABNORMAL LOW (ref 97–108)
Creatinine, Ser: 1.68 mg/dL — ABNORMAL HIGH (ref 0.57–1.00)
GFR calc Af Amer: 33 mL/min/{1.73_m2} — ABNORMAL LOW (ref >59)
GFR calc non Af Amer: 29 mL/min/{1.73_m2} — ABNORMAL LOW (ref >59)
Globulin, Total: 2.2 g/dL (ref 1.5–4.5)
Glucose: 320 mg/dL — ABNORMAL HIGH (ref 65–99)
POTASSIUM: 4.7 mmol/L (ref 3.5–5.2)
Sodium: 140 mmol/L (ref 134–144)
Total Protein: 6.4 g/dL (ref 6.0–8.5)

## 2014-11-27 LAB — LIPID PANEL
Chol/HDL Ratio: 6.8 ratio units — ABNORMAL HIGH (ref 0.0–4.4)
Cholesterol, Total: 136 mg/dL (ref 100–199)
HDL: 20 mg/dL — ABNORMAL LOW (ref >39)
LDL Calculated: 48 mg/dL (ref 0–99)
TRIGLYCERIDES: 342 mg/dL — AB (ref 0–149)
VLDL CHOLESTEROL CAL: 68 mg/dL — AB (ref 5–40)

## 2014-11-27 MED ORDER — INSULIN LISPRO PROT & LISPRO (75-25 MIX) 100 UNIT/ML KWIKPEN: PEN_INJECTOR | SUBCUTANEOUS | Status: DC

## 2014-12-07 ENCOUNTER — Telehealth: Payer: Self-pay | Admitting: Nurse Practitioner

## 2014-12-07 NOTE — Telephone Encounter (Signed)
Patient states that she has not been on benazepril in a long time and states she has only been taking Norvasc. Please advise. Does she need to take the benazepril?

## 2014-12-08 ENCOUNTER — Other Ambulatory Visit: Payer: Self-pay | Admitting: Nurse Practitioner

## 2014-12-08 DIAGNOSIS — N189 Chronic kidney disease, unspecified: Secondary | ICD-10-CM | POA: Diagnosis not present

## 2014-12-08 DIAGNOSIS — I129 Hypertensive chronic kidney disease with stage 1 through stage 4 chronic kidney disease, or unspecified chronic kidney disease: Secondary | ICD-10-CM | POA: Diagnosis not present

## 2014-12-08 DIAGNOSIS — E1129 Type 2 diabetes mellitus with other diabetic kidney complication: Secondary | ICD-10-CM | POA: Diagnosis not present

## 2014-12-08 DIAGNOSIS — D631 Anemia in chronic kidney disease: Secondary | ICD-10-CM | POA: Diagnosis not present

## 2014-12-09 ENCOUNTER — Other Ambulatory Visit: Payer: Self-pay | Admitting: Nurse Practitioner

## 2014-12-11 ENCOUNTER — Telehealth: Payer: Self-pay | Admitting: Nurse Practitioner

## 2014-12-15 NOTE — Telephone Encounter (Signed)
Papers mailed per debi boles

## 2014-12-21 ENCOUNTER — Other Ambulatory Visit: Payer: Self-pay | Admitting: *Deleted

## 2014-12-21 ENCOUNTER — Other Ambulatory Visit: Payer: Self-pay | Admitting: Nurse Practitioner

## 2014-12-21 MED ORDER — NYSTATIN 100000 UNIT/GM EX CREA: 1.0000 "application " | TOPICAL_CREAM | Freq: Two times a day (BID) | CUTANEOUS | Status: DC

## 2014-12-30 ENCOUNTER — Other Ambulatory Visit: Payer: Self-pay | Admitting: *Deleted

## 2014-12-30 MED ORDER — SILVER SULFADIAZINE 1 % EX CREA: 1.0000 "application " | TOPICAL_CREAM | Freq: Two times a day (BID) | CUTANEOUS | Status: DC

## 2014-12-31 ENCOUNTER — Other Ambulatory Visit: Payer: Self-pay | Admitting: Nurse Practitioner

## 2014-12-31 DIAGNOSIS — G894 Chronic pain syndrome: Secondary | ICD-10-CM

## 2014-12-31 MED ORDER — OXYCODONE-ACETAMINOPHEN 10-325 MG PO TABS: 1.0000 | ORAL_TABLET | Freq: Two times a day (BID) | ORAL | Status: DC

## 2014-12-31 NOTE — Telephone Encounter (Signed)
Patient notified that rx up front and ready for pick up 

## 2014-12-31 NOTE — Telephone Encounter (Signed)
Percocet rx ready for pick up.

## 2015-01-05 ENCOUNTER — Other Ambulatory Visit: Payer: Self-pay | Admitting: Nurse Practitioner

## 2015-01-06 MED ORDER — GLUCOSE BLOOD VI STRP: ORAL_STRIP | Status: DC

## 2015-01-06 NOTE — Telephone Encounter (Signed)
done

## 2015-01-07 ENCOUNTER — Other Ambulatory Visit: Payer: Self-pay | Admitting: Nurse Practitioner

## 2015-01-07 MED ORDER — GLUCOSE BLOOD VI STRP: ORAL_STRIP | Status: DC

## 2015-02-01 ENCOUNTER — Other Ambulatory Visit: Payer: Self-pay | Admitting: Nurse Practitioner

## 2015-02-01 DIAGNOSIS — G894 Chronic pain syndrome: Secondary | ICD-10-CM

## 2015-02-03 NOTE — Telephone Encounter (Signed)
Last filled 12/31/14, last seen 11/26/14

## 2015-02-04 ENCOUNTER — Other Ambulatory Visit: Payer: Self-pay | Admitting: Nurse Practitioner

## 2015-02-04 MED ORDER — OXYCODONE-ACETAMINOPHEN 10-325 MG PO TABS: 1.0000 | ORAL_TABLET | Freq: Two times a day (BID) | ORAL | Status: DC

## 2015-02-04 NOTE — Telephone Encounter (Signed)
Pt aware rx signed and up front.

## 2015-02-04 NOTE — Telephone Encounter (Signed)
Percocet rx ready for pick up.

## 2015-02-08 ENCOUNTER — Telehealth: Payer: Self-pay | Admitting: Nurse Practitioner

## 2015-02-11 ENCOUNTER — Other Ambulatory Visit: Payer: Self-pay | Admitting: Nurse Practitioner

## 2015-02-24 ENCOUNTER — Telehealth: Payer: Self-pay | Admitting: Nurse Practitioner

## 2015-02-24 NOTE — Telephone Encounter (Signed)
Left message on voicemail to call back and we willl make an appt

## 2015-02-26 ENCOUNTER — Ambulatory Visit (INDEPENDENT_AMBULATORY_CARE_PROVIDER_SITE_OTHER): Payer: Medicare Other | Admitting: Nurse Practitioner

## 2015-02-26 ENCOUNTER — Encounter: Payer: Self-pay | Admitting: Nurse Practitioner

## 2015-02-26 VITALS — BP 114/54 | HR 76 | Temp 98.6°F | Ht 64.0 in | Wt 236.0 lb

## 2015-02-26 DIAGNOSIS — E669 Obesity, unspecified: Secondary | ICD-10-CM | POA: Diagnosis not present

## 2015-02-26 DIAGNOSIS — K219 Gastro-esophageal reflux disease without esophagitis: Secondary | ICD-10-CM

## 2015-02-26 DIAGNOSIS — E785 Hyperlipidemia, unspecified: Secondary | ICD-10-CM

## 2015-02-26 DIAGNOSIS — R609 Edema, unspecified: Secondary | ICD-10-CM | POA: Diagnosis not present

## 2015-02-26 DIAGNOSIS — G2581 Restless legs syndrome: Secondary | ICD-10-CM

## 2015-02-26 DIAGNOSIS — G894 Chronic pain syndrome: Secondary | ICD-10-CM

## 2015-02-26 DIAGNOSIS — E119 Type 2 diabetes mellitus without complications: Secondary | ICD-10-CM

## 2015-02-26 DIAGNOSIS — I159 Secondary hypertension, unspecified: Secondary | ICD-10-CM

## 2015-02-26 DIAGNOSIS — Z794 Long term (current) use of insulin: Secondary | ICD-10-CM

## 2015-02-26 DIAGNOSIS — D509 Iron deficiency anemia, unspecified: Secondary | ICD-10-CM | POA: Diagnosis not present

## 2015-02-26 LAB — POCT GLYCOSYLATED HEMOGLOBIN (HGB A1C): HEMOGLOBIN A1C: 7.1

## 2015-02-26 MED ORDER — OXYCODONE-ACETAMINOPHEN 10-325 MG PO TABS: 1.0000 | ORAL_TABLET | Freq: Two times a day (BID) | ORAL | Status: DC

## 2015-02-26 MED ORDER — INSULIN LISPRO PROT & LISPRO (75-25 MIX) 100 UNIT/ML KWIKPEN: PEN_INJECTOR | SUBCUTANEOUS | Status: DC

## 2015-02-26 NOTE — Patient Instructions (Signed)

## 2015-02-26 NOTE — Progress Notes (Addendum)
Subjective:    Patient ID: Anna [name], female    DOB: [birth year], 80 y.o.   MRN: [id number]  Patient here today for follow up of chronic medical problems.   Hypertension This is a chronic problem. The current episode started more than 1 year ago. The problem is controlled. Associated symptoms include shortness of breath. Pertinent negatives include no chest pain or palpitations. Risk factors for coronary artery disease include diabetes mellitus, dyslipidemia, obesity, post-menopausal state and sedentary lifestyle. Past treatments include beta blockers, central alpha agonists and calcium channel blockers. The current treatment provides moderate improvement. Compliance problems include diet and exercise.  Hypertensive end-organ damage includes CAD/MI.  Hyperlipidemia This is a chronic problem. The current episode started more than 1 year ago. The problem is uncontrolled. Recent lipid tests were reviewed and are normal. Exacerbating diseases include diabetes and obesity. She has no history of hypothyroidism. Associated symptoms include shortness of breath. Pertinent negatives include no chest pain or myalgias. Current antihyperlipidemic treatment includes statins and fibric acid derivatives. The current treatment provides moderate improvement of lipids. Compliance problems include adherence to diet and adherence to exercise.  Risk factors for coronary artery disease include diabetes mellitus, dyslipidemia, hypertension and post-menopausal.  Diabetes She presents for her follow-up diabetic visit. She has type 2 diabetes mellitus. No MedicAlert identification noted. Her disease course has been fluctuating. Hypoglycemia symptoms include dizziness. Associated symptoms include fatigue and weakness. Pertinent negatives for diabetes include no chest pain and no visual change. Diabetic complications include heart disease. Risk factors for coronary artery disease include diabetes mellitus, dyslipidemia,  hypertension and post-menopausal. Current diabetic treatment includes insulin injections and oral agent (monotherapy). She is compliant with treatment some of the time. Her weight is stable. When asked about meal planning, she reported none. She has not had a previous visit with a dietitian. She never participates in exercise. Her breakfast blood glucose is taken between 8-9 am. Her breakfast blood glucose range is generally 180-200 mg/dl. Her dinner blood glucose range is generally >200 mg/dl. Her overall blood glucose range is 180-200 mg/dl. (Blood sugars have improved some no highs greater then 250 in the last couple of months.) An ACE inhibitor/angiotensin II receptor blocker is not being taken. She does not see a podiatrist.Eye exam is not current.  Gastroesophageal Reflux She reports no chest pain, no coughing or no nausea. Associated symptoms include fatigue.  Osteoarthritis  Pt takes percocet 1 tab BID for pain knee, and back pain Peripheral Edema Pt takes lasix daily-Pt still has edema in lower extremities. COPD Pt takes Symbicort daily-Pt feels like it is well controlled Peripheral Neuropathy Pt takes neurontin 300 mg TID-Pt does not fell like this is helping. Still  Has lots of nerve pain in her feet- seems to be worse at night. Iron deficiency anemia Currently not on any iron supplements   Review of Systems  Constitutional: Positive for fatigue.  Respiratory: Positive for shortness of breath. Negative for cough.   Cardiovascular: Negative for chest pain and palpitations.  Gastrointestinal: Negative for nausea.  Musculoskeletal: Negative for myalgias.  Neurological: Positive for dizziness and weakness.  All other systems reviewed and are negative.      Objective:   Physical Exam  Constitutional: She is oriented to person, place, and time. She appears well-developed and well-nourished.  HENT:  Head: Normocephalic.  Nose: Nose normal.  Mouth/Throat: Oropharynx is clear and  moist.  Eyes: Pupils are equal, round, and reactive to light.  Neck: Normal range of motion. Neck supple.  Cardiovascular: Normal rate, regular rhythm and intact distal pulses.   Murmur heard. Pulmonary/Chest: Effort normal and breath sounds normal.  Abdominal: Soft. Bowel sounds are normal. She exhibits no distension. There is no tenderness.  Musculoskeletal: Normal range of motion. She exhibits edema (2+ in LE).  Erythematous medial side of right great toe- very tender to touch.  Neurological: She is alert and oriented to person, place, and time.  Skin: Skin is warm and dry.  Psychiatric: She has a normal mood and affect. Her behavior is normal. Judgment and thought content normal.  Vitals reviewed.  BP 114/54 mmHg  Pulse 76  Temp(Src) 98.6 F (37 C) (Oral)  Ht 5' 4"  (1.626 m)  Wt 236 lb (107.049 kg)  BMI 40.49 kg/m2     Results for orders placed or performed in visit on 02/26/15  POCT glycosylated hemoglobin (Hb A1C)  Result Value Ref Range   Hemoglobin A1C 7.1     Assessment & Plan:  1. Diabetes mellitus type 2, insulin dependent (HCC) Continue to watch carbs Blood sugar considered low when below 100 - POCT glycosylated hemoglobin (Hb A1C) - Insulin Lispro Prot & Lispro (HUMALOG MIX 75/25 KWIKPEN) (75-25) 100 UNIT/ML Kwikpen; 65 u in am and 55u in pm  Dispense: 12 pen; Refill: 11  2. Hyperlipidemia Low fat diet - Lipid panel  3. Secondary hypertension, unspecified Do not add salt to diet - CMP14+EGFR  4. Gastroesophageal reflux disease without esophagitis Avoid spicy foods Do not eat 2 hours prior to bedtime  5. RLS (restless legs syndrome) Keep legs wram at night  6. Peripheral edema elevate legs when sitting Compression hose daily  7. Anemia, iron deficiency - Anemia Profile B  8. Obesity Discussed diet and exercise for person with BMI >25 Will recheck weight in 3-6 months     Labs pending Health maintenance reviewed Diet and exercise  encouraged Continue all meds Follow up  In 3 month   [place], FNP

## 2015-02-27 LAB — ANEMIA PROFILE B
BASOS ABS: 0 10*3/uL (ref 0.0–0.2)
Basos: 0 %
EOS (ABSOLUTE): 0.1 10*3/uL (ref 0.0–0.4)
Eos: 1 %
FOLATE: 10.3 ng/mL (ref >3.0)
Ferritin: 25 ng/mL (ref 15–150)
Hematocrit: 28.7 % — ABNORMAL LOW (ref 34.0–46.6)
Hemoglobin: 8.9 g/dL — CL (ref 11.1–15.9)
IMMATURE GRANULOCYTES: 1 %
IRON: 49 ug/dL (ref 27–139)
Immature Grans (Abs): 0.1 10*3/uL (ref 0.0–0.1)
Iron Saturation: 11 % — ABNORMAL LOW (ref 15–55)
LYMPHS ABS: 0.9 10*3/uL (ref 0.7–3.1)
Lymphs: 10 %
MCH: 26.7 pg (ref 26.6–33.0)
MCHC: 31 g/dL — ABNORMAL LOW (ref 31.5–35.7)
MCV: 86 fL (ref 79–97)
MONOCYTES: 9 %
Monocytes Absolute: 0.9 10*3/uL (ref 0.1–0.9)
NEUTROS PCT: 79 %
Neutrophils Absolute: 7.3 10*3/uL — ABNORMAL HIGH (ref 1.4–7.0)
PLATELETS: 244 10*3/uL (ref 150–379)
RBC: 3.33 x10E6/uL — AB (ref 3.77–5.28)
RDW: 15.7 % — ABNORMAL HIGH (ref 12.3–15.4)
Retic Ct Pct: 3.2 % — ABNORMAL HIGH (ref 0.6–2.6)
TIBC: 465 ug/dL — AB (ref 250–450)
UIBC: 416 ug/dL — AB (ref 118–369)
Vitamin B-12: 221 pg/mL (ref 211–946)
WBC: 9.2 10*3/uL (ref 3.4–10.8)

## 2015-02-27 LAB — LIPID PANEL
CHOL/HDL RATIO: 5 ratio — AB (ref 0.0–4.4)
Cholesterol, Total: 104 mg/dL (ref 100–199)
HDL: 21 mg/dL — AB (ref >39)
LDL Calculated: 27 mg/dL (ref 0–99)
Triglycerides: 279 mg/dL — ABNORMAL HIGH (ref 0–149)
VLDL CHOLESTEROL CAL: 56 mg/dL — AB (ref 5–40)

## 2015-02-27 LAB — CMP14+EGFR
ALT: 18 IU/L (ref 0–32)
AST: 29 IU/L (ref 0–40)
Albumin/Globulin Ratio: 1.4 (ref 1.1–2.5)
Albumin: 3.7 g/dL (ref 3.5–4.8)
Alkaline Phosphatase: 41 IU/L (ref 39–117)
BUN/Creatinine Ratio: 24 (ref 11–26)
BUN: 50 mg/dL — AB (ref 8–27)
Bilirubin Total: 0.6 mg/dL (ref 0.0–1.2)
CALCIUM: 9.2 mg/dL (ref 8.7–10.3)
CO2: 24 mmol/L (ref 18–29)
Chloride: 98 mmol/L (ref 97–106)
Creatinine, Ser: 2.11 mg/dL — ABNORMAL HIGH (ref 0.57–1.00)
GFR, EST AFRICAN AMERICAN: 25 mL/min/{1.73_m2} — AB (ref >59)
GFR, EST NON AFRICAN AMERICAN: 22 mL/min/{1.73_m2} — AB (ref >59)
GLUCOSE: 228 mg/dL — AB (ref 65–99)
Globulin, Total: 2.6 g/dL (ref 1.5–4.5)
Potassium: 4.5 mmol/L (ref 3.5–5.2)
Sodium: 141 mmol/L (ref 136–144)
TOTAL PROTEIN: 6.3 g/dL (ref 6.0–8.5)

## 2015-03-01 ENCOUNTER — Other Ambulatory Visit: Payer: Self-pay | Admitting: Nurse Practitioner

## 2015-03-01 DIAGNOSIS — D649 Anemia, unspecified: Secondary | ICD-10-CM

## 2015-03-01 MED ORDER — HEMOCYTE PLUS 106-1 MG PO CAPS: 1.0000 | ORAL_CAPSULE | Freq: Every day | ORAL | Status: DC

## 2015-03-02 ENCOUNTER — Other Ambulatory Visit: Payer: Self-pay | Admitting: *Deleted

## 2015-03-02 DIAGNOSIS — Z23 Encounter for immunization: Secondary | ICD-10-CM

## 2015-03-02 NOTE — Progress Notes (Signed)
Patient aware. Will pick up rx and hemocult cards

## 2015-03-02 NOTE — Addendum Note (Signed)
Addended by: Rolena Infante on: 03/02/2015 11:36 AM   Modules accepted: Orders

## 2015-03-04 ENCOUNTER — Other Ambulatory Visit: Payer: Self-pay | Admitting: Nurse Practitioner

## 2015-03-04 ENCOUNTER — Telehealth: Payer: Self-pay | Admitting: Nurse Practitioner

## 2015-03-05 MED ORDER — IRON 325 (65 FE) MG PO TABS: 1.0000 | ORAL_TABLET | Freq: Every day | ORAL | Status: DC

## 2015-03-05 NOTE — Telephone Encounter (Signed)
Iron tablet changed

## 2015-03-05 NOTE — Telephone Encounter (Signed)
Patient aware that Iron tablet has been changed

## 2015-03-06 ENCOUNTER — Telehealth: Payer: Self-pay | Admitting: Nurse Practitioner

## 2015-03-08 MED ORDER — IRON 325 (65 FE) MG PO TABS: 1.0000 | ORAL_TABLET | Freq: Every day | ORAL | Status: DC

## 2015-03-08 NOTE — Telephone Encounter (Signed)
Pt aware rx sent to [facility].

## 2015-03-15 ENCOUNTER — Other Ambulatory Visit (INDEPENDENT_AMBULATORY_CARE_PROVIDER_SITE_OTHER): Payer: Medicare Other

## 2015-03-15 DIAGNOSIS — R799 Abnormal finding of blood chemistry, unspecified: Secondary | ICD-10-CM

## 2015-03-15 LAB — POCT HEMOGLOBIN: Hemoglobin: 9 g/dL — AB (ref 12.2–16.2)

## 2015-03-26 ENCOUNTER — Other Ambulatory Visit: Payer: Self-pay | Admitting: Nurse Practitioner

## 2015-04-01 DIAGNOSIS — IMO0001 Reserved for inherently not codable concepts without codable children: Secondary | ICD-10-CM

## 2015-04-01 HISTORY — DX: Reserved for inherently not codable concepts without codable children: IMO0001

## 2015-04-02 ENCOUNTER — Ambulatory Visit (INDEPENDENT_AMBULATORY_CARE_PROVIDER_SITE_OTHER): Payer: Medicare Other | Admitting: Nurse Practitioner

## 2015-04-02 ENCOUNTER — Encounter: Payer: Self-pay | Admitting: Nurse Practitioner

## 2015-04-02 VITALS — BP 123/43 | HR 70 | Temp 97.9°F | Ht 64.0 in | Wt 225.0 lb

## 2015-04-02 DIAGNOSIS — D509 Iron deficiency anemia, unspecified: Secondary | ICD-10-CM

## 2015-04-02 DIAGNOSIS — Z794 Long term (current) use of insulin: Secondary | ICD-10-CM

## 2015-04-02 DIAGNOSIS — E119 Type 2 diabetes mellitus without complications: Secondary | ICD-10-CM

## 2015-04-02 DIAGNOSIS — G894 Chronic pain syndrome: Secondary | ICD-10-CM | POA: Diagnosis not present

## 2015-04-02 LAB — POCT HEMOGLOBIN: Hemoglobin: 9.5 g/dL — AB (ref 12.2–16.2)

## 2015-04-02 MED ORDER — GLIMEPIRIDE 4 MG PO TABS: ORAL_TABLET | ORAL | Status: DC

## 2015-04-02 MED ORDER — OXYCODONE-ACETAMINOPHEN 10-325 MG PO TABS: 1.0000 | ORAL_TABLET | Freq: Two times a day (BID) | ORAL | Status: DC

## 2015-04-02 NOTE — Addendum Note (Signed)
Addended by: Chevis Pretty on: 04/02/2015 01:33 PM   Modules accepted: Orders

## 2015-04-02 NOTE — Progress Notes (Signed)
   Subjective:    Patient ID: Anna [name], female    DOB: 12-26-1934, 80 y.o.   MRN: [id number]  HPI Anna [name] comes in today for hgb recheck- her last level was 9.0 and she was put on 2 iron tablets a day. Doing ok- no complaints.    Review of Systems  Constitutional: Negative.   HENT: Negative.   Respiratory: Negative.   Cardiovascular: Negative.   Genitourinary: Negative.   Neurological: Negative.   Psychiatric/Behavioral: Negative.   All other systems reviewed and are negative.      Objective:   Physical Exam  Constitutional: She is oriented to person, place, and time. She appears well-developed and well-nourished.  Cardiovascular: Normal rate, regular rhythm and normal heart sounds.   Pulmonary/Chest: Effort normal and breath sounds normal.  Neurological: She is alert and oriented to person, place, and time.  Skin: Skin is warm and dry.  Psychiatric: She has a normal mood and affect. Her behavior is normal. Judgment and thought content normal.   BP 123/43 mmHg  Pulse 70  Temp(Src) 97.9 F (36.6 C) (Oral)  Ht 5\' 4"  (1.626 m)  Wt 225 lb (102.059 kg)  BMI 38.60 kg/m2  Results for orders placed or performed in visit on 04/02/15  POCT hemoglobin  Result Value Ref Range   Hemoglobin 9.5 (A) 12.2 - 16.2 g/dL          Assessment & Plan:  1. Anemia, iron deficiency Continue iron tablets Please bring back hemoccult cards back as soon as possible. - POCT hemoglobin  Mary-Margaret Hassell Done, FNP

## 2015-04-02 NOTE — Patient Instructions (Signed)
Anemia, Nonspecific Anemia is a condition in which the concentration of red blood cells or hemoglobin in the blood is below normal. Hemoglobin is a substance in red blood cells that carries oxygen to the tissues of the body. Anemia results in not enough oxygen reaching these tissues.  CAUSES  Common causes of anemia include:   Excessive bleeding. Bleeding may be internal or external. This includes excessive bleeding from periods (in women) or from the intestine.   Poor nutrition.   Chronic kidney, thyroid, and liver disease.  Bone marrow disorders that decrease red blood cell production.  Cancer and treatments for cancer.  HIV, AIDS, and their treatments.  Spleen problems that increase red blood cell destruction.  Blood disorders.  Excess destruction of red blood cells due to infection, medicines, and autoimmune disorders. SIGNS AND SYMPTOMS   Minor weakness.   Dizziness.   Headache.  Palpitations.   Shortness of breath, especially with exercise.   Paleness.  Cold sensitivity.  Indigestion.  Nausea.  Difficulty sleeping.  Difficulty concentrating. Symptoms may occur suddenly or they may develop slowly.  DIAGNOSIS  Additional blood tests are often needed. These help your health care provider determine the best treatment. Your health care provider will check your stool for blood and look for other causes of blood loss.  TREATMENT  Treatment varies depending on the cause of the anemia. Treatment can include:   Supplements of iron, vitamin B12, or folic acid.   Hormone medicines.   A blood transfusion. This may be needed if blood loss is severe.   Hospitalization. This may be needed if there is significant continual blood loss.   Dietary changes.  Spleen removal. HOME CARE INSTRUCTIONS Keep all follow-up appointments. It often takes many weeks to correct anemia, and having your health care provider check on your condition and your response to  treatment is very important. SEEK IMMEDIATE MEDICAL CARE IF:   You develop extreme weakness, shortness of breath, or chest pain.   You become dizzy or have trouble concentrating.  You develop heavy vaginal bleeding.   You develop a rash.   You have bloody or black, tarry stools.   You faint.   You vomit up blood.   You vomit repeatedly.   You have abdominal pain.  You have a fever or persistent symptoms for more than 2-3 days.   You have a fever and your symptoms suddenly get worse.   You are dehydrated.  MAKE SURE YOU:  Understand these instructions.  Will watch your condition.  Will get help right away if you are not doing well or get worse.   This information is not intended to replace advice given to you by your health care provider. Make sure you discuss any questions you have with your health care provider.   Document Released: 05/25/2004 Document Revised: 12/18/2012 Document Reviewed: 10/11/2012 Elsevier Interactive Patient Education 2016 [employer]  

## 2015-04-05 ENCOUNTER — Other Ambulatory Visit: Payer: Medicare Other

## 2015-04-05 DIAGNOSIS — Z1212 Encounter for screening for malignant neoplasm of rectum: Secondary | ICD-10-CM | POA: Diagnosis not present

## 2015-04-07 LAB — FECAL OCCULT BLOOD, IMMUNOCHEMICAL: FECAL OCCULT BLD: POSITIVE — AB

## 2015-04-09 ENCOUNTER — Other Ambulatory Visit: Payer: Self-pay | Admitting: Nurse Practitioner

## 2015-04-09 DIAGNOSIS — K921 Melena: Secondary | ICD-10-CM

## 2015-04-09 DIAGNOSIS — D649 Anemia, unspecified: Secondary | ICD-10-CM

## 2015-04-15 ENCOUNTER — Ambulatory Visit (INDEPENDENT_AMBULATORY_CARE_PROVIDER_SITE_OTHER): Payer: Medicare Other | Admitting: Nurse Practitioner

## 2015-04-15 ENCOUNTER — Encounter: Payer: Self-pay | Admitting: Nurse Practitioner

## 2015-04-15 VITALS — BP 136/82 | HR 76 | Temp 97.2°F | Ht 64.0 in | Wt 235.0 lb

## 2015-04-15 DIAGNOSIS — D509 Iron deficiency anemia, unspecified: Secondary | ICD-10-CM | POA: Diagnosis not present

## 2015-04-15 DIAGNOSIS — K921 Melena: Secondary | ICD-10-CM | POA: Diagnosis not present

## 2015-04-15 DIAGNOSIS — E119 Type 2 diabetes mellitus without complications: Secondary | ICD-10-CM

## 2015-04-15 DIAGNOSIS — Z794 Long term (current) use of insulin: Secondary | ICD-10-CM

## 2015-04-15 LAB — POCT HEMOGLOBIN: HEMOGLOBIN: 9.9 g/dL — AB (ref 12.2–16.2)

## 2015-04-15 MED ORDER — INSULIN LISPRO PROT & LISPRO (75-25 MIX) 100 UNIT/ML KWIKPEN: PEN_INJECTOR | SUBCUTANEOUS | Status: DC

## 2015-04-15 NOTE — Patient Instructions (Signed)
Anemia, Nonspecific Anemia is a condition in which the concentration of red blood cells or hemoglobin in the blood is below normal. Hemoglobin is a substance in red blood cells that carries oxygen to the tissues of the body. Anemia results in not enough oxygen reaching these tissues.  CAUSES  Common causes of anemia include:   Excessive bleeding. Bleeding may be internal or external. This includes excessive bleeding from periods (in women) or from the intestine.   Poor nutrition.   Chronic kidney, thyroid, and liver disease.  Bone marrow disorders that decrease red blood cell production.  Cancer and treatments for cancer.  HIV, AIDS, and their treatments.  Spleen problems that increase red blood cell destruction.  Blood disorders.  Excess destruction of red blood cells due to infection, medicines, and autoimmune disorders. SIGNS AND SYMPTOMS   Minor weakness.   Dizziness.   Headache.  Palpitations.   Shortness of breath, especially with exercise.   Paleness.  Cold sensitivity.  Indigestion.  Nausea.  Difficulty sleeping.  Difficulty concentrating. Symptoms may occur suddenly or they may develop slowly.  DIAGNOSIS  Additional blood tests are often needed. These help your health care provider determine the best treatment. Your health care provider will check your stool for blood and look for other causes of blood loss.  TREATMENT  Treatment varies depending on the cause of the anemia. Treatment can include:   Supplements of iron, vitamin B12, or folic acid.   Hormone medicines.   A blood transfusion. This may be needed if blood loss is severe.   Hospitalization. This may be needed if there is significant continual blood loss.   Dietary changes.  Spleen removal. HOME CARE INSTRUCTIONS Keep all follow-up appointments. It often takes many weeks to correct anemia, and having your health care provider check on your condition and your response to  treatment is very important. SEEK IMMEDIATE MEDICAL CARE IF:   You develop extreme weakness, shortness of breath, or chest pain.   You become dizzy or have trouble concentrating.  You develop heavy vaginal bleeding.   You develop a rash.   You have bloody or black, tarry stools.   You faint.   You vomit up blood.   You vomit repeatedly.   You have abdominal pain.  You have a fever or persistent symptoms for more than 2-3 days.   You have a fever and your symptoms suddenly get worse.   You are dehydrated.  MAKE SURE YOU:  Understand these instructions.  Will watch your condition.  Will get help right away if you are not doing well or get worse.   This information is not intended to replace advice given to you by your health care provider. Make sure you discuss any questions you have with your health care provider.   Document Released: 05/25/2004 Document Revised: 12/18/2012 Document Reviewed: 10/11/2012 Elsevier Interactive Patient Education 2016 [employer]  

## 2015-04-15 NOTE — Progress Notes (Addendum)
   Subjective:    Patient ID: Anna [name], female    DOB: 03-22-1935, 80 y.o.   MRN: [id number]  HPI Patient in today for recheck- her hgb has been low. SHe has had a positive hemoccult and just had colonoscopy in June- family does not want her to go through that again. SHe denies any abdominal pain except when she needs to have bowel movement. She says that she does have hemorrhoids that bleed frequently.  * blood sugars drop into 40's 2 hours after eating breakfast. Has been going on for while.  Review of Systems  Constitutional: Negative.   HENT: Negative.   Respiratory: Negative.   Cardiovascular: Negative.   Genitourinary: Negative.   Neurological: Negative.   Psychiatric/Behavioral: Negative.   All other systems reviewed and are negative.      Objective:   Physical Exam  Constitutional: She is oriented to person, place, and time. She appears well-developed and well-nourished.  Cardiovascular: Normal rate, regular rhythm and normal heart sounds.   Pulmonary/Chest: Effort normal and breath sounds normal.  Abdominal: Soft. Bowel sounds are normal. She exhibits no distension and no mass. There is no tenderness. There is no rebound and no guarding.  Neurological: She is alert and oriented to person, place, and time.  Skin: Skin is warm.  Psychiatric: She has a normal mood and affect. Her behavior is normal. Judgment and thought content normal.   BP 136/82 mmHg  Pulse 76  Temp(Src) 97.2 F (36.2 C) (Oral)  Ht 5\' 4"  (1.626 m)  Wt 235 lb (106.595 kg)  BMI 40.32 kg/m2  Results for orders placed or performed in visit on 04/15/15  POCT hemoglobin  Result Value Ref Range   Hemoglobin 9.9 (A) 12.2 - 16.2 g/dL            Assessment & Plan:   1. Anemia, iron deficiency   2. Blood in stool    Patient has referrlal in works with Dr. Carlean Purl- will start there- may do hematologist referral.  3.Hypoglycemia Switch and do 55u in AM and 65 units at night Call and let me  know if helps blood sugars.  Mary-Margaret Hassell Done, FNP

## 2015-04-15 NOTE — Addendum Note (Signed)
Addended by: Chevis Pretty on: 04/15/2015 04:49 PM   Modules accepted: Orders

## 2015-04-19 ENCOUNTER — Other Ambulatory Visit: Payer: Self-pay | Admitting: Nurse Practitioner

## 2015-04-19 MED ORDER — GLUCOSE BLOOD VI STRP: ORAL_STRIP | Status: DC

## 2015-04-20 ENCOUNTER — Emergency Department (HOSPITAL_COMMUNITY): Payer: Medicare Other

## 2015-04-20 ENCOUNTER — Inpatient Hospital Stay (HOSPITAL_COMMUNITY)
Admission: EM | Admit: 2015-04-20 | Discharge: 2015-04-25 | DRG: 438 | Disposition: A | Discharge status: Home Health | Payer: Medicare Other | Attending: Internal Medicine | Admitting: Internal Medicine

## 2015-04-20 ENCOUNTER — Encounter (HOSPITAL_COMMUNITY): Payer: Self-pay

## 2015-04-20 ENCOUNTER — Inpatient Hospital Stay (HOSPITAL_COMMUNITY): Payer: Medicare Other

## 2015-04-20 DIAGNOSIS — R509 Fever, unspecified: Secondary | ICD-10-CM | POA: Diagnosis not present

## 2015-04-20 DIAGNOSIS — E538 Deficiency of other specified B group vitamins: Secondary | ICD-10-CM | POA: Diagnosis present

## 2015-04-20 DIAGNOSIS — I130 Hypertensive heart and chronic kidney disease with heart failure and stage 1 through stage 4 chronic kidney disease, or unspecified chronic kidney disease: Secondary | ICD-10-CM | POA: Diagnosis present

## 2015-04-20 DIAGNOSIS — R0609 Other forms of dyspnea: Secondary | ICD-10-CM | POA: Diagnosis not present

## 2015-04-20 DIAGNOSIS — I5033 Acute on chronic diastolic (congestive) heart failure: Secondary | ICD-10-CM | POA: Diagnosis not present

## 2015-04-20 DIAGNOSIS — E1140 Type 2 diabetes mellitus with diabetic neuropathy, unspecified: Secondary | ICD-10-CM | POA: Diagnosis present

## 2015-04-20 DIAGNOSIS — K851 Biliary acute pancreatitis without necrosis or infection: Principal | ICD-10-CM | POA: Diagnosis present

## 2015-04-20 DIAGNOSIS — G2581 Restless legs syndrome: Secondary | ICD-10-CM | POA: Diagnosis not present

## 2015-04-20 DIAGNOSIS — S3991XA Unspecified injury of abdomen, initial encounter: Secondary | ICD-10-CM | POA: Diagnosis not present

## 2015-04-20 DIAGNOSIS — R05 Cough: Secondary | ICD-10-CM | POA: Diagnosis not present

## 2015-04-20 DIAGNOSIS — E785 Hyperlipidemia, unspecified: Secondary | ICD-10-CM | POA: Diagnosis not present

## 2015-04-20 DIAGNOSIS — J449 Chronic obstructive pulmonary disease, unspecified: Secondary | ICD-10-CM | POA: Diagnosis present

## 2015-04-20 DIAGNOSIS — Z79899 Other long term (current) drug therapy: Secondary | ICD-10-CM | POA: Diagnosis not present

## 2015-04-20 DIAGNOSIS — M545 Low back pain: Secondary | ICD-10-CM | POA: Diagnosis not present

## 2015-04-20 DIAGNOSIS — Z888 Allergy status to other drugs, medicaments and biological substances status: Secondary | ICD-10-CM | POA: Diagnosis not present

## 2015-04-20 DIAGNOSIS — E1122 Type 2 diabetes mellitus with diabetic chronic kidney disease: Secondary | ICD-10-CM | POA: Diagnosis present

## 2015-04-20 DIAGNOSIS — J9601 Acute respiratory failure with hypoxia: Secondary | ICD-10-CM | POA: Diagnosis present

## 2015-04-20 DIAGNOSIS — E1165 Type 2 diabetes mellitus with hyperglycemia: Secondary | ICD-10-CM | POA: Diagnosis present

## 2015-04-20 DIAGNOSIS — N179 Acute kidney failure, unspecified: Secondary | ICD-10-CM | POA: Diagnosis not present

## 2015-04-20 DIAGNOSIS — E118 Type 2 diabetes mellitus with unspecified complications: Secondary | ICD-10-CM

## 2015-04-20 DIAGNOSIS — K8020 Calculus of gallbladder without cholecystitis without obstruction: Secondary | ICD-10-CM | POA: Diagnosis not present

## 2015-04-20 DIAGNOSIS — K8051 Calculus of bile duct without cholangitis or cholecystitis with obstruction: Secondary | ICD-10-CM | POA: Diagnosis not present

## 2015-04-20 DIAGNOSIS — Z6841 Body Mass Index (BMI) 40.0 and over, adult: Secondary | ICD-10-CM

## 2015-04-20 DIAGNOSIS — Z049 Encounter for examination and observation for unspecified reason: Secondary | ICD-10-CM | POA: Diagnosis not present

## 2015-04-20 DIAGNOSIS — D509 Iron deficiency anemia, unspecified: Secondary | ICD-10-CM | POA: Diagnosis present

## 2015-04-20 DIAGNOSIS — N184 Chronic kidney disease, stage 4 (severe): Secondary | ICD-10-CM | POA: Diagnosis present

## 2015-04-20 DIAGNOSIS — J410 Simple chronic bronchitis: Secondary | ICD-10-CM

## 2015-04-20 DIAGNOSIS — R0602 Shortness of breath: Secondary | ICD-10-CM

## 2015-04-20 DIAGNOSIS — F418 Other specified anxiety disorders: Secondary | ICD-10-CM | POA: Diagnosis not present

## 2015-04-20 DIAGNOSIS — M069 Rheumatoid arthritis, unspecified: Secondary | ICD-10-CM | POA: Diagnosis not present

## 2015-04-20 DIAGNOSIS — K859 Acute pancreatitis without necrosis or infection, unspecified: Secondary | ICD-10-CM

## 2015-04-20 DIAGNOSIS — I10 Essential (primary) hypertension: Secondary | ICD-10-CM | POA: Diagnosis not present

## 2015-04-20 DIAGNOSIS — G4733 Obstructive sleep apnea (adult) (pediatric): Secondary | ICD-10-CM | POA: Diagnosis not present

## 2015-04-20 DIAGNOSIS — Z87891 Personal history of nicotine dependence: Secondary | ICD-10-CM

## 2015-04-20 DIAGNOSIS — R110 Nausea: Secondary | ICD-10-CM | POA: Diagnosis not present

## 2015-04-20 DIAGNOSIS — N185 Chronic kidney disease, stage 5: Secondary | ICD-10-CM | POA: Diagnosis not present

## 2015-04-20 DIAGNOSIS — Z86010 Personal history of colonic polyps: Secondary | ICD-10-CM | POA: Diagnosis not present

## 2015-04-20 DIAGNOSIS — Z8249 Family history of ischemic heart disease and other diseases of the circulatory system: Secondary | ICD-10-CM

## 2015-04-20 DIAGNOSIS — Z01818 Encounter for other preprocedural examination: Secondary | ICD-10-CM | POA: Diagnosis not present

## 2015-04-20 DIAGNOSIS — D631 Anemia in chronic kidney disease: Secondary | ICD-10-CM | POA: Diagnosis not present

## 2015-04-20 DIAGNOSIS — Z01810 Encounter for preprocedural cardiovascular examination: Secondary | ICD-10-CM | POA: Diagnosis not present

## 2015-04-20 DIAGNOSIS — Z66 Do not resuscitate: Secondary | ICD-10-CM | POA: Diagnosis not present

## 2015-04-20 DIAGNOSIS — E872 Acidosis: Secondary | ICD-10-CM | POA: Diagnosis not present

## 2015-04-20 DIAGNOSIS — K760 Fatty (change of) liver, not elsewhere classified: Secondary | ICD-10-CM | POA: Diagnosis present

## 2015-04-20 DIAGNOSIS — Z794 Long term (current) use of insulin: Secondary | ICD-10-CM

## 2015-04-20 DIAGNOSIS — Z7951 Long term (current) use of inhaled steroids: Secondary | ICD-10-CM | POA: Diagnosis not present

## 2015-04-20 DIAGNOSIS — R609 Edema, unspecified: Secondary | ICD-10-CM | POA: Diagnosis present

## 2015-04-20 DIAGNOSIS — R079 Chest pain, unspecified: Secondary | ICD-10-CM | POA: Diagnosis not present

## 2015-04-20 DIAGNOSIS — K830 Cholangitis: Secondary | ICD-10-CM | POA: Diagnosis not present

## 2015-04-20 DIAGNOSIS — Z833 Family history of diabetes mellitus: Secondary | ICD-10-CM | POA: Diagnosis not present

## 2015-04-20 DIAGNOSIS — I159 Secondary hypertension, unspecified: Secondary | ICD-10-CM | POA: Diagnosis not present

## 2015-04-20 DIAGNOSIS — R0902 Hypoxemia: Secondary | ICD-10-CM

## 2015-04-20 DIAGNOSIS — R600 Localized edema: Secondary | ICD-10-CM | POA: Diagnosis present

## 2015-04-20 DIAGNOSIS — G894 Chronic pain syndrome: Secondary | ICD-10-CM | POA: Diagnosis present

## 2015-04-20 DIAGNOSIS — K8050 Calculus of bile duct without cholangitis or cholecystitis without obstruction: Secondary | ICD-10-CM | POA: Diagnosis not present

## 2015-04-20 DIAGNOSIS — Z825 Family history of asthma and other chronic lower respiratory diseases: Secondary | ICD-10-CM | POA: Diagnosis not present

## 2015-04-20 DIAGNOSIS — Z7982 Long term (current) use of aspirin: Secondary | ICD-10-CM | POA: Diagnosis not present

## 2015-04-20 DIAGNOSIS — IMO0001 Reserved for inherently not codable concepts without codable children: Secondary | ICD-10-CM | POA: Diagnosis present

## 2015-04-20 DIAGNOSIS — K219 Gastro-esophageal reflux disease without esophagitis: Secondary | ICD-10-CM | POA: Diagnosis not present

## 2015-04-20 DIAGNOSIS — R935 Abnormal findings on diagnostic imaging of other abdominal regions, including retroperitoneum: Secondary | ICD-10-CM | POA: Diagnosis not present

## 2015-04-20 DIAGNOSIS — E109 Type 1 diabetes mellitus without complications: Secondary | ICD-10-CM | POA: Diagnosis not present

## 2015-04-20 DIAGNOSIS — R7989 Other specified abnormal findings of blood chemistry: Secondary | ICD-10-CM | POA: Diagnosis present

## 2015-04-20 DIAGNOSIS — R918 Other nonspecific abnormal finding of lung field: Secondary | ICD-10-CM | POA: Diagnosis not present

## 2015-04-20 HISTORY — DX: Other specified anxiety disorders: F41.8

## 2015-04-20 LAB — URINALYSIS, ROUTINE W REFLEX MICROSCOPIC
Bilirubin Urine: NEGATIVE
Glucose, UA: NEGATIVE mg/dL
Hgb urine dipstick: NEGATIVE
Ketones, ur: NEGATIVE mg/dL
LEUKOCYTES UA: NEGATIVE
Nitrite: POSITIVE — AB
PROTEIN: NEGATIVE mg/dL
Specific Gravity, Urine: 1.013 (ref 1.005–1.030)
pH: 5.5 (ref 5.0–8.0)

## 2015-04-20 LAB — COMPREHENSIVE METABOLIC PANEL
ALT: 118 U/L — AB (ref 14–54)
ANION GAP: 13 (ref 5–15)
AST: 302 U/L — ABNORMAL HIGH (ref 15–41)
Albumin: 3.4 g/dL — ABNORMAL LOW (ref 3.5–5.0)
Alkaline Phosphatase: 43 U/L (ref 38–126)
BUN: 57 mg/dL — ABNORMAL HIGH (ref 6–20)
CALCIUM: 9.5 mg/dL (ref 8.9–10.3)
CHLORIDE: 102 mmol/L (ref 101–111)
CO2: 25 mmol/L (ref 22–32)
CREATININE: 2.25 mg/dL — AB (ref 0.44–1.00)
GFR, EST AFRICAN AMERICAN: 23 mL/min — AB (ref >60)
GFR, EST NON AFRICAN AMERICAN: 20 mL/min — AB (ref >60)
Glucose, Bld: 264 mg/dL — ABNORMAL HIGH (ref 65–99)
Potassium: 5 mmol/L (ref 3.5–5.1)
Sodium: 140 mmol/L (ref 135–145)
Total Bilirubin: 2.1 mg/dL — ABNORMAL HIGH (ref 0.3–1.2)
Total Protein: 6.3 g/dL — ABNORMAL LOW (ref 6.5–8.1)

## 2015-04-20 LAB — URINE MICROSCOPIC-ADD ON

## 2015-04-20 LAB — I-STAT CG4 LACTIC ACID, ED
LACTIC ACID, VENOUS: 2.85 mmol/L — AB (ref 0.5–2.0)
LACTIC ACID, VENOUS: 2.88 mmol/L — AB (ref 0.5–2.0)

## 2015-04-20 LAB — CBC
HCT: 37.7 % (ref 36.0–46.0)
Hemoglobin: 11.2 g/dL — ABNORMAL LOW (ref 12.0–15.0)
MCH: 28.3 pg (ref 26.0–34.0)
MCHC: 29.7 g/dL — ABNORMAL LOW (ref 30.0–36.0)
MCV: 95.2 fL (ref 78.0–100.0)
PLATELETS: 182 10*3/uL (ref 150–400)
RBC: 3.96 MIL/uL (ref 3.87–5.11)
RDW: 19.3 % — ABNORMAL HIGH (ref 11.5–15.5)
WBC: 9.4 10*3/uL (ref 4.0–10.5)

## 2015-04-20 LAB — LIPASE, BLOOD: LIPASE: 876 U/L — AB (ref 11–51)

## 2015-04-20 LAB — GLUCOSE, CAPILLARY: GLUCOSE-CAPILLARY: 201 mg/dL — AB (ref 65–99)

## 2015-04-20 LAB — LACTIC ACID, PLASMA: LACTIC ACID, VENOUS: 1.8 mmol/L (ref 0.5–2.0)

## 2015-04-20 LAB — PROCALCITONIN: PROCALCITONIN: 1.5 ng/mL

## 2015-04-20 MED ORDER — ENOXAPARIN SODIUM 30 MG/0.3ML ~~LOC~~ SOLN
30.0000 mg | SUBCUTANEOUS | Status: DC
  Administered 2015-04-20: 30 mg via SUBCUTANEOUS
  Filled 2015-04-20: qty 0.3

## 2015-04-20 MED ORDER — CLONIDINE HCL 0.2 MG PO TABS
0.2000 mg | ORAL_TABLET | Freq: Two times a day (BID) | ORAL | Status: DC
  Administered 2015-04-20 – 2015-04-25 (×10): 0.2 mg via ORAL
  Filled 2015-04-20 (×3): qty 1
  Filled 2015-04-20: qty 2
  Filled 2015-04-20: qty 1
  Filled 2015-04-20: qty 2
  Filled 2015-04-20 (×4): qty 1

## 2015-04-20 MED ORDER — ACETAMINOPHEN 325 MG PO TABS
650.0000 mg | ORAL_TABLET | Freq: Four times a day (QID) | ORAL | Status: DC | PRN
  Administered 2015-04-24: 650 mg via ORAL
  Filled 2015-04-20: qty 2

## 2015-04-20 MED ORDER — MORPHINE SULFATE (PF) 2 MG/ML IV SOLN: 1.0000 mg | INTRAVENOUS | Status: DC | PRN

## 2015-04-20 MED ORDER — BARIUM SULFATE 2.1 % PO SUSP
ORAL | Status: AC
  Filled 2015-04-20: qty 2

## 2015-04-20 MED ORDER — GABAPENTIN 300 MG PO CAPS
300.0000 mg | ORAL_CAPSULE | Freq: Four times a day (QID) | ORAL | Status: DC
  Administered 2015-04-20 – 2015-04-25 (×15): 300 mg via ORAL
  Filled 2015-04-20 (×15): qty 1

## 2015-04-20 MED ORDER — CITALOPRAM HYDROBROMIDE 20 MG PO TABS
20.0000 mg | ORAL_TABLET | Freq: Every day | ORAL | Status: DC
  Administered 2015-04-20 – 2015-04-25 (×6): 20 mg via ORAL
  Filled 2015-04-20 (×6): qty 1

## 2015-04-20 MED ORDER — FENTANYL CITRATE (PF) 100 MCG/2ML IJ SOLN
50.0000 ug | Freq: Once | INTRAMUSCULAR | Status: AC
  Administered 2015-04-20: 50 ug via INTRAVENOUS
  Filled 2015-04-20: qty 2

## 2015-04-20 MED ORDER — SODIUM CHLORIDE 0.9 % IV SOLN
INTRAVENOUS | Status: DC
  Administered 2015-04-20 – 2015-04-21 (×4): via INTRAVENOUS

## 2015-04-20 MED ORDER — PANTOPRAZOLE SODIUM 40 MG PO TBEC
40.0000 mg | DELAYED_RELEASE_TABLET | Freq: Every day | ORAL | Status: DC
  Administered 2015-04-20 – 2015-04-25 (×6): 40 mg via ORAL
  Filled 2015-04-20 (×6): qty 1

## 2015-04-20 MED ORDER — SODIUM CHLORIDE 0.9 % IV BOLUS (SEPSIS)
1000.0000 mL | Freq: Once | INTRAVENOUS | Status: AC
  Administered 2015-04-20: 1000 mL via INTRAVENOUS

## 2015-04-20 MED ORDER — SODIUM CHLORIDE 0.9 % IJ SOLN
3.0000 mL | Freq: Two times a day (BID) | INTRAMUSCULAR | Status: DC
  Administered 2015-04-20 – 2015-04-25 (×8): 3 mL via INTRAVENOUS

## 2015-04-20 MED ORDER — GLIMEPIRIDE 2 MG PO TABS
2.0000 mg | ORAL_TABLET | Freq: Every day | ORAL | Status: DC
  Administered 2015-04-21 – 2015-04-23 (×3): 2 mg via ORAL
  Filled 2015-04-20 (×5): qty 1

## 2015-04-20 MED ORDER — HYDROMORPHONE HCL 1 MG/ML IJ SOLN
0.5000 mg | Freq: Once | INTRAMUSCULAR | Status: AC
  Administered 2015-04-20: 0.5 mg via INTRAVENOUS
  Filled 2015-04-20: qty 1

## 2015-04-20 MED ORDER — ONDANSETRON HCL 4 MG/2ML IJ SOLN: 4.0000 mg | Freq: Three times a day (TID) | INTRAMUSCULAR | Status: AC | PRN

## 2015-04-20 MED ORDER — BUDESONIDE-FORMOTEROL FUMARATE 80-4.5 MCG/ACT IN AERO
1.0000 | INHALATION_SPRAY | Freq: Two times a day (BID) | RESPIRATORY_TRACT | Status: DC
  Administered 2015-04-21 – 2015-04-24 (×7): 1 via RESPIRATORY_TRACT
  Filled 2015-04-20: qty 6.9

## 2015-04-20 MED ORDER — ACETAMINOPHEN 650 MG RE SUPP: 650.0000 mg | Freq: Four times a day (QID) | RECTAL | Status: DC | PRN

## 2015-04-20 MED ORDER — INSULIN ASPART 100 UNIT/ML ~~LOC~~ SOLN
0.0000 [IU] | SUBCUTANEOUS | Status: DC
  Administered 2015-04-20 – 2015-04-21 (×2): 5 [IU] via SUBCUTANEOUS
  Administered 2015-04-21 (×2): 3 [IU] via SUBCUTANEOUS
  Administered 2015-04-21 (×2): 5 [IU] via SUBCUTANEOUS
  Administered 2015-04-21 – 2015-04-22 (×2): 3 [IU] via SUBCUTANEOUS
  Administered 2015-04-22: 5 [IU] via SUBCUTANEOUS
  Administered 2015-04-22: 3 [IU] via SUBCUTANEOUS
  Administered 2015-04-22 (×2): 5 [IU] via SUBCUTANEOUS
  Administered 2015-04-23 (×2): 11 [IU] via SUBCUTANEOUS
  Administered 2015-04-23 (×3): 5 [IU] via SUBCUTANEOUS
  Administered 2015-04-23: 15 [IU] via SUBCUTANEOUS
  Administered 2015-04-24: 5 [IU] via SUBCUTANEOUS

## 2015-04-20 MED ORDER — CARVEDILOL 12.5 MG PO TABS
12.5000 mg | ORAL_TABLET | Freq: Two times a day (BID) | ORAL | Status: DC
  Administered 2015-04-21 – 2015-04-25 (×8): 12.5 mg via ORAL
  Filled 2015-04-20 (×8): qty 1

## 2015-04-20 MED ORDER — ONDANSETRON HCL 4 MG/2ML IJ SOLN
4.0000 mg | INTRAMUSCULAR | Status: DC | PRN
  Administered 2015-04-22: 4 mg via INTRAVENOUS

## 2015-04-20 MED ORDER — AMLODIPINE BESYLATE 5 MG PO TABS
5.0000 mg | ORAL_TABLET | Freq: Every day | ORAL | Status: DC
  Administered 2015-04-20 – 2015-04-25 (×6): 5 mg via ORAL
  Filled 2015-04-20 (×6): qty 1

## 2015-04-20 MED ORDER — MORPHINE SULFATE (PF) 2 MG/ML IV SOLN
1.0000 mg | INTRAVENOUS | Status: DC | PRN
  Administered 2015-04-20 – 2015-04-22 (×8): 2 mg via INTRAVENOUS
  Filled 2015-04-20 (×11): qty 1

## 2015-04-20 NOTE — ED Notes (Signed)
CBG 201 

## 2015-04-20 NOTE — ED Notes (Signed)
Pt reports nausea that began last night with one episode of vomiting this morning.  Pt received 4mg  Zofran PTA and reports relief of nausea at this time.

## 2015-04-20 NOTE — ED Notes (Signed)
Inpatient RN unavailable for report at this time, will call back.

## 2015-04-20 NOTE — Consult Note (Signed)
[facility] Consult: 4:37 PM 04/20/2015  LOS: 0 days    Referring Provider: Dr Sabra Heck in ED Primary Care Physician:  Chevis Pretty, FNP Primary Gastroenterologist:  Dr. Carlean Purl     Reason for Consultation:  Pancreatitis.    HPI: Anna [name] is a 80 y.o. female.  Morbidly obese with COPD, OSA (no CPAP).  Stage 4 CKD.  Diabetic on insulin.  IDA on po Iron and has undergone parenteral iron infusions within the last year.   GERD, on PPI at home.  She is generally in poor health.  09/2014 Colonoscopyfor IDA and +FOBT.  5 sessile polyps, 3 to 77mm, in transverse and descending colon (path: adnomas).  no recall planned due to age. .   09/2014 EGD for IDA and + FOBT.  Normal study.   For the last few months her appetite has been poor. No nausea. She just says that the food tastes different and she is not hungry. She moves her bowels every other day, last bowel movement was yesterday. Her stools tend to be dark and constipated due to the oral iron she takes. He hasn't started any new medicines in the last few months, though the dose of her insulin was changed around a few weeks back. She doesn't drink alcohol.  She mobilizes in a wheelchair and sometimes, for very short distances, with a walker.  She has been seen in the past by Dr. Lorrene Reid of nephrology.  Last night developed nausea.  Dry heaves and a small amount of mucoid emesis began this morning.  She has pain in her low back chronically but this is worse today and is radiating into her legs.  No scapular pain or abdominal pain.  She was very weak and could barely get up to open the door this morning. Her family noted her to be dry heaving and experiencing Reiger's and she was sweaty.  Lipase 876, AST/ALT 302/118, t bili 2.1.  Elevated lactic acid.   CT  shows cholelithiasis, no cholecystitis, no pancreatitis, no ductal dilatation. Small periumbilical hernia. Diffuse body wall anasarca. Scattered tics.       Past Medical History  Diagnosis Date  . Hypertension   . COPD (chronic obstructive pulmonary disease) ([place])   . Diabetes mellitus without complication ([place])   . Neuromuscular disorder ([place])   . Depression with anxiety   . Osteopenia   . Arthritis   . GERD (gastroesophageal reflux disease)   . Hyperlipidemia   . Obesity   . Vitamin B12 deficiency   . Chronic kidney disease (CKD), stage IV (severe) ([place])   . Sleep apnea     no cpap use  . Anemia     iron infusion- x2 recent , last 5'16  . Hx of adenomatous colonic polyps 09/2014    Past Surgical History  Procedure Laterality Date  . Breast surgery  April 1996    needle biopsy   . Tubal ligation    . Cataract extraction, bilateral Bilateral   . Esophagogastroduodenoscopy N/A 10/19/2014    Procedure: ESOPHAGOGASTRODUODENOSCOPY (EGD);  Surgeon: Glendell Docker  Simonne Maffucci, MD;  Location: [facility];  Service: Endoscopy;  Laterality: N/A;  . Colonoscopy N/A 10/19/2014    Procedure: COLONOSCOPY;  Surgeon: Gatha Mayer, MD;  Location: [facility];  Service: Endoscopy;  Laterality: N/A;    Prior to Admission medications   Medication Sig Start Date End Date Taking? Authorizing Provider  Alcohol Swabs (PHARMACIST CHOICE ALCOHOL) PADS USE 2 TIMES DAILY FOR DIABETIC TESTING 12/09/14  Yes Chipper Herb, MD  amLODipine (NORVASC) 5 MG tablet Take 1 tablet (5 mg total) by mouth daily. 11/26/14  Yes Mary-Margaret Hassell Done, FNP  antiseptic oral rinse (BIOTENE) LIQD 15 mLs by Mouth Rinse route as needed for dry mouth.   Yes Historical Provider, MD  aspirin 81 MG tablet Take 81 mg by mouth daily.     Yes Historical Provider, MD  B-D UF III MINI PEN NEEDLES 31G X 5 MM MISC USE TWICE A DAY TO INJECT NOVOLOG MIX 70/30 INSULIN 03/11/13  Yes Mary-Margaret Hassell Done, FNP  budesonide-formoterol (SYMBICORT) 80-4.5  MCG/ACT inhaler Inhale 1 puff into the lungs 2 (two) times daily. 11/26/14  Yes Mary-Margaret Hassell Done, FNP  carvedilol (COREG) 25 MG tablet TAKE 1 TABLET BY MOUTH TWICE DAILY WITH A MEAL 12/10/14  Yes Mary-Margaret Hassell Done, FNP  citalopram (CELEXA) 20 MG tablet TAKE 1 TABLET BY MOUTH EVERY DAY 12/10/14  Yes Mary-Margaret Hassell Done, FNP  cloNIDine (CATAPRES) 0.2 MG tablet TAKE 1 TABLET BY MOUTH TWICE DAILY 12/10/14  Yes Mary-Margaret Hassell Done, FNP  CRESTOR 20 MG tablet TAKE 1 TABLET BY MOUTH EVERY DAY 02/05/15  Yes Mary-Margaret Hassell Done, FNP  EASY TOUCH INSULIN SYRINGE 31G X 5/16" 1 ML MISC USE TO INJECT INSULIN TWICE DAILY 12/10/12  Yes Mary-Margaret Hassell Done, FNP  EASY TOUCH PEN NEEDLES 31G X 8 MM MISC USE TWICE DAILY 03/05/15  Yes Mary-Margaret Hassell Done, FNP  febuxostat (ULORIC) 40 MG tablet Take 1 tablet (40 mg total) by mouth daily. 11/26/14  Yes Mary-Margaret Hassell Done, FNP  fenofibrate (TRICOR) 145 MG tablet Take 1 tablet (145 mg total) by mouth daily. 11/26/14  Yes Mary-Margaret Hassell Done, FNP  Ferrous Sulfate (IRON) 325 (65 FE) MG TABS Take 1 tablet by mouth daily. Patient taking differently: Take 1 tablet by mouth 2 (two) times daily.  03/08/15  Yes Chipper Herb, MD  furosemide (LASIX) 40 MG tablet TAKE 1 TABLET BY MOUTH EVERY MORNING AND TAKE 1/2 TABLET BY MOUTH AT LUNCH 01/08/15  Yes Mary-Margaret Hassell Done, FNP  gabapentin (NEURONTIN) 300 MG capsule TAKE 1 CAPSULE BY MOUTH FOUR TIMES A DAY 03/26/15  Yes Mary-Margaret Hassell Done, FNP  glimepiride (AMARYL) 4 MG tablet TAKE 1/2 TABLET BY MOUTH EVERY DAY BEFORE BREAKFAST Patient taking differently: Take 2 mg by mouth daily with breakfast.  04/02/15  Yes Mary-Margaret Hassell Done, FNP  Insulin Lispro Prot & Lispro (HUMALOG MIX 75/25 KWIKPEN) (75-25) 100 UNIT/ML Kwikpen 55 u in am and 65u in pm 04/15/15  Yes Mary-Margaret Hassell Done, FNP  nystatin cream (MYCOSTATIN) APPLY TOPICALLY TWO TIMES DAILY. 03/26/15  Yes Mary-Margaret Hassell Done, FNP  omeprazole (PRILOSEC) 40 MG capsule TAKE 1  CAPSULE BY MOUTH EVERY DAY 11/26/14  Yes Mary-Margaret Hassell Done, FNP  oxyCODONE-acetaminophen (PERCOCET) 10-325 MG tablet Take 1 tablet by mouth 2 (two) times daily. 04/02/15  Yes Mary-Margaret Hassell Done, FNP  PHARMACIST CHOICE LANCETS MISC USE 2 TIMES DAILY FOR DIABETIC TESTING 12/09/14  Yes Chipper Herb, MD  ULORIC 40 MG tablet TAKE 1 TABLET BY MOUTH ONCE DAILY 12/10/14  Yes Mary-Margaret Hassell Done, FNP  glucose blood (ACCU-CHEK AVIVA PLUS) test strip Test  4-5 x a day and prn dx E11.9 01/07/15   Mary-Margaret Hassell Done, FNP  glucose blood (ONETOUCH VERIO) test strip Test 4x a day and prn  E11.9 04/19/15   Mary-Margaret Hassell Done, FNP  glucose blood test strip Test 4x a day and prn  Dx E11.9 01/07/15   Mary-Margaret Hassell Done, FNP    Scheduled Meds: . Barium Sulfate      . insulin aspart  0-15 Units Subcutaneous 6 times per day   Infusions: . sodium chloride     PRN Meds:    Allergies as of 04/20/2015 - Review Complete 04/20/2015  Allergen Reaction Noted  . Ace inhibitors Other (See Comments) 12/08/2014  . Niaspan [niacin er] Other (See Comments) 07/16/2010    Family History  Problem Relation Age of Onset  . Hypertension Mother   . Hypertension Father   . Diabetes Father   . COPD Father   . Diabetes Sister     x 2  . Colon cancer Neg Hx   . Colon polyps Neg Hx   . Skin cancer Sister   . Testicular cancer Brother   . Esophageal cancer Neg Hx     Social History   Social History  . Marital Status: Widowed    Spouse Name: N/A  . Number of Children: 28  . Years of Education: N/A   Occupational History  . Retired    Social History Main Topics  . Smoking status: Former Smoker    Types: Cigarettes    Quit date: 05/01/2010  . Smokeless tobacco: Not on file  . Alcohol Use: No  . Drug Use: No  . Sexual Activity: Not on file   Other Topics Concern  . Not on file   Social History Narrative   Widowed 1 son and 1 daughter   Retired   No caffeine    REVIEW OF SYSTEMS: Constitutional:   Weight has dropped about 30 pounds in the last 3 months. Generally doesn't have a lot of energy but this hasn't changed recently. Completely sedentary. ENT:  No nose bleeds Pulm:  No new dyspnea. No cough. CV:  No palpitations, no LE edema.  GU:  No hematuria, no frequency GI:  No dysphagia.. Per HPI Heme:  No unusual bleeding or bruising. Takes her oral iron religiously.   Neuro:  No headaches, no peripheral tingling or numbness Derm:  No itching, no rash or sores.  Endocrine:  No sweats or chills.  No polyuria or dysuria Immunization:  did not inquire. Travel:  None beyond local counties in last few months.    PHYSICAL EXAM: Vital signs in last 24 hours: Filed Vitals:   04/20/15 1245 04/20/15 1530  BP: 157/61 185/62  Pulse: 72 72  Temp:    Resp: 20 21   Wt Readings from Last 3 Encounters:  04/20/15 105.235 kg (232 lb)  04/15/15 106.595 kg (235 lb)  04/02/15 102.059 kg (225 lb)    General: obese, chronically significantly ill appearing Head:  No asymmetry, no signs of head trauma.   Eyes:  scant icteric sclera. Ears: Moderately hard of hearing.  Nose:No congestion or discharge. Mouth:Moist, clear, pink oral mucosa. Edentulous. Neck:  Obese. No masses, no thyromegaly. No JVD. Lungs:Clear to auscultation and percussion. No labored breathing. No cough. HeartRRR. No MRG. S1/S2 audible. Abdomen:  Obese. NT. ND.. hypoactive bowel sounds but no tinkling or tympanitic sounds.  Rectal: Deferred   Musc/Skeltl: No joint erythema. Some arthritic changes in the knees and toes. Extremities:  Nonpitting lower extremity  edema.   Neurologic:  Alert. Oriented 3. Moves all 4 limbs, limb strength not tested. No tremor.  Skin:  no jaundice. No telangiectasia, no sores or rash. Tattoos:  None    Psych:  Calm, cooperative.  Intake/Output from previous day:   Intake/Output this shift:    LAB RESULTS:  Recent Labs  04/20/15 0838  WBC 9.4  HGB 11.2*  HCT 37.7  PLT 182    BMET Lab Results  Component Value Date   NA 140 04/20/2015   NA 141 02/26/2015   NA 140 11/26/2014   K 5.0 04/20/2015   K 4.5 02/26/2015   K 4.7 11/26/2014   CL 102 04/20/2015   CL 98 02/26/2015   CL 96* 11/26/2014   CO2 25 04/20/2015   CO2 24 02/26/2015   CO2 25 11/26/2014   GLUCOSE 264* 04/20/2015   GLUCOSE 228* 02/26/2015   GLUCOSE 320* 11/26/2014   BUN 57* 04/20/2015   BUN 50* 02/26/2015   BUN 42* 11/26/2014   CREATININE 2.25* 04/20/2015   CREATININE 2.11* 02/26/2015   CREATININE 1.68* 11/26/2014   CALCIUM 9.5 04/20/2015   CALCIUM 9.2 02/26/2015   CALCIUM 9.5 11/26/2014   LFT  Recent Labs  04/20/15 0838  PROT 6.3*  ALBUMIN 3.4*  AST 302*  ALT 118*  ALKPHOS 43  BILITOT 2.1*   PT/INR No results found for: INR, PROTIME Hepatitis Panel No results for input(s): HEPBSAG, HCVAB, HEPAIGM, HEPBIGM in the last 72 hours. C-Diff No components found for: CDIFF Lipase     Component Value Date/Time   LIPASE 876* 04/20/2015 [id number]    Drugs of Abuse  No results found for: LABOPIA, COCAINSCRNUR, LABBENZ, AMPHETMU, THCU, LABBARB   RADIOLOGY STUDIES: Ct Abdomen Pelvis Wo Contrast  04/20/2015  CLINICAL DATA:  Post fall now with worsening low back pain. EXAM: CT ABDOMEN AND PELVIS WITHOUT CONTRAST TECHNIQUE: Multidetector CT imaging of the abdomen and pelvis was performed following the standard protocol without IV contrast. COMPARISON:  Lumbar spine MRI - 01/28/2008 FINDINGS: The lack of intravenous contrast limits the ability to evaluate solid abdominal organs. Normal hepatic contour. Radiopaque gallstones and layering sludge is seen within otherwise normal-appearing gallbladder. No definitive gallbladder wall thickening or pericholecystic fluid on this noncontrast examination. No ascites. The hepatic flexure of the colon is incidentally noted to be interposed adjacent to the anterior aspect of the right lobe of the liver. The bilateral kidneys appear mildly atrophic with  diffuse cortical thinning. Otherwise, normal noncontrast appearance of the bilateral kidneys. There is a minimal amount of grossly symmetric bilateral perinephric stranding presumably age and body habitus related. No renal stones. No renal stones are seen along the course of either ureter or the urinary bladder. Normal noncontrast appearance of the urinary bladder given degree distention. No urinary obstruction. Note is made of an approximately 2.9 x 2.2 cm right adrenal gland nodule which appears morphologically similar to the 12/2007 examination. Normal noncontrast appearance of the left adrenal gland, pancreas and spleen. Ingested enteric contrast extends to the level of the proximal descending colon. Scattered colonic diverticulosis without evidence of diverticulitis. No evidence of enteric obstruction. No pneumoperitoneum, pneumatosis or portal venous gas. Large amount of calcified atherosclerotic plaque within a normal caliber abdominal aorta. No bulky retroperitoneal, mesenteric, pelvic or inguinal lymphadenopathy. Normal appearance of the pelvic organs for age. No discrete adnexal lesion. No free fluid in the pelvic cul-de-sac. Limited visualization of the lower thorax is degraded secondary to patient respiratory artifact. No discrete focal airspace opacities. No  pleural effusion. Borderline cardiomegaly. Exuberant calcifications within the mitral valve annulus. Coronary artery calcifications. No pericardial effusion. No acute or aggressive osseous abnormalities. Moderate to severe multilevel thoracolumbar spine DDD, likely worse at L4-L5 and L5-S1 with disc space height loss, endplate irregularity and sclerosis. Stigmata of DISH within the caudal aspect of the thoracic spine. Small mesenteric fat containing periumbilical hernia. Mild diffuse body wall anasarca, most conspicuous about the midline of the low back. Regional soft tissues appear otherwise normal. IMPRESSION: 1. No acute findings within the  abdomen or pelvis. Specifically, no evidence of enteric or urinary obstruction. No acute osseus abnormalities. 2. Moderate severe multilevel thoracolumbar spine DDD, progressed since the 2009 examination. 3. 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 4. Mild atrophy of the bilateral kidneys, nonspecific though could be seen in the setting of medical renal disease. Clinical correlation is advised. 5. Colonic diverticulosis without evidence of diverticulitis. 6. Borderline cardiomegaly with exuberant calcifications within the mitral valve annulus. Coronary artery calcifications. 7. Cholelithiasis without evidence of cholecystitis on this noncontrast examination. Electronically Signed   By: Sandi Mariscal M.D.   On: 04/20/2015 15:03   Dg Chest Port 1 View  04/20/2015  CLINICAL DATA:  Fever and nausea EXAM: PORTABLE CHEST 1 VIEW COMPARISON:  PA and lateral chest x-ray of January 28, 2008 FINDINGS: The lungs are adequately inflated. There are coarse lung markings in the left retrocardiac region. There is no pleural effusion. The cardiac silhouette is enlarged. The pulmonary vascularity is not engorged. There is increased soft tissue density in the left suprahilar region lying lateral to the aortic arch. The trachea is midline. The bony thorax exhibits no acute abnormality. IMPRESSION: Stable enlargement cardiac silhouette without significant pulmonary vascular congestion. Increased prominence of the soft tissues in the left hilar and suprahilar regions may be in part due to projection. However, atelectasis or infiltrate in the right upper lobe is not excluded. If the patient can tolerate the procedure, a PA and lateral chest x-ray would be useful. Electronically Signed   By: David  Martinique M.D.   On: 04/20/2015 09:33    ENDOSCOPIC STUDIES: Per HPI  IMPRESSION:   *  Acute  pancreatitis.  Has gallstones on CT, so presume biliary.  Other causes could be meds, though all her current meds have been in place for a long time.  *  Iron deficiency anemia. Suspect anemia of chronic disease related to multiple comorbidities and advanced chronic kidney disease.  currently hemoglobin is 11.2 compared with her baseline of about 9. Suspect that there is dehydration accounting for some of the rise in her hemoglobin.   *  CKD  *  OSA, not on CPAP.  *  Low back pain radiating to the legs.    PLAN:     *  Ultrasound ordered, not sure it will provide adequate visualization given her obesity.  May need MRCP *  Clears ok if tolerated, IV hydration.  analgesics for her back pain.    Azucena Freed  04/20/2015, 4:37 PM Pager: [phone]

## 2015-04-20 NOTE — Progress Notes (Signed)
Patient and patient's son at bedside stated that patient wants to be resuscitated and all life-saving measures if something happens to her. MD order for DNR. Baltazar Najjar NP paged and notified.

## 2015-04-20 NOTE — Plan of Care (Signed)
Pt and family expressed to RN that the wish is for a full code. Order changed. KJKG, NP [facility]

## 2015-04-20 NOTE — H&P (Signed)
[facility] History and Physical                                                                                    Ceriah [name], is a 80 y.o. female  MRN: [id number]   DOB - [birth year]  Admit Date - 04/20/2015  Outpatient Primary MD for the patient is Anna Pretty, FNP  Referring MD: Anna [name] / ER  Consulting M.D: Anna [name] [facility] (primary is Anna [name])  PMH: Past Medical History  Diagnosis Date  . Hypertension   . COPD (chronic obstructive pulmonary disease) ([place])   . Diabetes mellitus without complication ([place])   . Neuromuscular disorder ([place])   . Depression with anxiety   . Osteopenia   . Arthritis   . GERD (gastroesophageal reflux disease)   . Hyperlipidemia   . Obesity   . Vitamin B12 deficiency   . Chronic kidney disease (CKD), stage IV (severe) ([place])   . Sleep apnea     no cpap use  . Anemia     iron infusion- x2 recent , last 5'16  . Hx of adenomatous colonic polyps 09/2014      PSH: Past Surgical History  Procedure Laterality Date  . Breast surgery  April 1996    needle biopsy   . Tubal ligation    . Cataract extraction, bilateral Bilateral   . Esophagogastroduodenoscopy N/A 10/19/2014    Procedure: ESOPHAGOGASTRODUODENOSCOPY (EGD);  Surgeon: Gatha Mayer, MD;  Location: [facility];  Service: Endoscopy;  Laterality: N/A;  . Colonoscopy N/A 10/19/2014    Procedure: COLONOSCOPY;  Surgeon: Gatha Mayer, MD;  Location: [facility];  Service: Endoscopy;  Laterality: N/A;     CC:  Chief Complaint  Patient presents with  . Nausea     HPI: 80 year old female patient with multiple medical problems including hypertension, COPD, diabetes on insulin, peripheral neuropathy and restless leg syndrome, depression with anxiety, chronic back pain, dyslipidemia, sleep apnea and chronic kidney disease stage IV. Patient presented today to the ER with unrelenting nausea without emesis (one episode try heave) associated initially with upper abdominal and back  pain. After arrival patient primarily complaining of new low back pain that is unrelenting and radiating from the mid back down both hips and down the legs. No similar symptoms in the past. Was given Zofran by EMS with resolution of nausea by arrival and reporting hunger and denying abdominal pain.  ER Evaluation and treatment: CT abdomen and pelvis without acute finding; was noted to have progression of moderate severe multilevel thoracolumbar spine DDD, stable 2.9 cm right adrenal gland nodule, colonic diverticulosis without diverticulitis, and cholelithiasis without evidence of cholecystitis but exam was noncontrasted Portable chest x-ray nonacute and unremarkable Serum lactate, CMET, CBC, UA with urine culture and blood cultures obtained by EDP  1 L normal saline Fentanyl 50 g IV 2 doses  Dilaudid 0.5 mg IV 1 dose  Review of Systems   In addition to the HPI above,  No Fever-chills, myalgias or other constitutional symptoms No Headache, changes with Vision or hearing, new weakness, tingling, numbness in any extremity, No problems swallowing food or Liquids, indigestion/reflux No Chest pain, Cough or Shortness of Breath,  palpitations, orthopnea or DOE No melena or hematochezia, no dark tarry stools No dysuria, hematuria or flank pain No new skin rashes, lesions, masses or bruises, No recent weight gain or loss No polyuria, polydypsia or polyphagia,  *A full 10 point Review of Systems was done, except as stated above, all other Review of Systems were negative.  Social History Social History  Substance Use Topics  . Smoking status: Former Smoker    Types: Cigarettes    Quit date: 05/01/2010  . Smokeless tobacco: Not on file  . Alcohol Use: No    Resides at: Private residence  Lives with: Primarily alone but her children come by the house to check on her daily and spend at least 4 hours per day with her  Ambulatory status: Rolling walker   Family History Family History    Problem Relation Age of Onset  . Hypertension Mother   . Hypertension Father   . Diabetes Father   . COPD Father   . Diabetes Sister     x 2  . Colon cancer Neg Hx   . Colon polyps Neg Hx   . Skin cancer Sister   . Testicular cancer Brother   . Esophageal cancer Neg Hx      Prior to Admission medications   Medication Sig Start Date End Date Taking? Authorizing Provider  Alcohol Swabs (PHARMACIST CHOICE ALCOHOL) PADS USE 2 TIMES DAILY FOR DIABETIC TESTING 12/09/14  Yes Chipper Herb, MD  amLODipine (NORVASC) 5 MG tablet Take 1 tablet (5 mg total) by mouth daily. 11/26/14  Yes Mary-Margaret Hassell Done, FNP  antiseptic oral rinse (BIOTENE) LIQD 15 mLs by Mouth Rinse route as needed for dry mouth.   Yes Historical Provider, MD  aspirin 81 MG tablet Take 81 mg by mouth daily.     Yes Historical Provider, MD  B-D UF III MINI PEN NEEDLES 31G X 5 MM MISC USE TWICE A DAY TO INJECT NOVOLOG MIX 70/30 INSULIN 03/11/13  Yes Mary-Margaret Hassell Done, FNP  budesonide-formoterol (SYMBICORT) 80-4.5 MCG/ACT inhaler Inhale 1 puff into the lungs 2 (two) times daily. 11/26/14  Yes Mary-Margaret Hassell Done, FNP  carvedilol (COREG) 25 MG tablet TAKE 1 TABLET BY MOUTH TWICE DAILY WITH A MEAL 12/10/14  Yes Mary-Margaret Hassell Done, FNP  citalopram (CELEXA) 20 MG tablet TAKE 1 TABLET BY MOUTH EVERY DAY 12/10/14  Yes Mary-Margaret Hassell Done, FNP  cloNIDine (CATAPRES) 0.2 MG tablet TAKE 1 TABLET BY MOUTH TWICE DAILY 12/10/14  Yes Mary-Margaret Hassell Done, FNP  CRESTOR 20 MG tablet TAKE 1 TABLET BY MOUTH EVERY DAY 02/05/15  Yes Mary-Margaret Hassell Done, FNP  EASY TOUCH INSULIN SYRINGE 31G X 5/16" 1 ML MISC USE TO INJECT INSULIN TWICE DAILY 12/10/12  Yes Mary-Margaret Hassell Done, FNP  EASY TOUCH PEN NEEDLES 31G X 8 MM MISC USE TWICE DAILY 03/05/15  Yes Mary-Margaret Hassell Done, FNP  febuxostat (ULORIC) 40 MG tablet Take 1 tablet (40 mg total) by mouth daily. 11/26/14  Yes Mary-Margaret Hassell Done, FNP  fenofibrate (TRICOR) 145 MG tablet Take 1 tablet (145 mg  total) by mouth daily. 11/26/14  Yes Mary-Margaret Hassell Done, FNP  Ferrous Sulfate (IRON) 325 (65 FE) MG TABS Take 1 tablet by mouth daily. Patient taking differently: Take 1 tablet by mouth 2 (two) times daily.  03/08/15  Yes Chipper Herb, MD  furosemide (LASIX) 40 MG tablet TAKE 1 TABLET BY MOUTH EVERY MORNING AND TAKE 1/2 TABLET BY MOUTH AT LUNCH 01/08/15  Yes Mary-Margaret Hassell Done, FNP  gabapentin (NEURONTIN) 300 MG capsule TAKE 1 CAPSULE BY  MOUTH FOUR TIMES A DAY 03/26/15  Yes Mary-Margaret Hassell Done, FNP  glimepiride (AMARYL) 4 MG tablet TAKE 1/2 TABLET BY MOUTH EVERY DAY BEFORE BREAKFAST Patient taking differently: Take 2 mg by mouth daily with breakfast.  04/02/15  Yes Mary-Margaret Hassell Done, FNP  Insulin Lispro Prot & Lispro (HUMALOG MIX 75/25 KWIKPEN) (75-25) 100 UNIT/ML Kwikpen 55 u in am and 65u in pm 04/15/15  Yes Mary-Margaret Hassell Done, FNP  nystatin cream (MYCOSTATIN) APPLY TOPICALLY TWO TIMES DAILY. 03/26/15  Yes Mary-Margaret Hassell Done, FNP  omeprazole (PRILOSEC) 40 MG capsule TAKE 1 CAPSULE BY MOUTH EVERY DAY 11/26/14  Yes Mary-Margaret Hassell Done, FNP  oxyCODONE-acetaminophen (PERCOCET) 10-325 MG tablet Take 1 tablet by mouth 2 (two) times daily. 04/02/15  Yes Mary-Margaret Hassell Done, FNP  PHARMACIST CHOICE LANCETS MISC USE 2 TIMES DAILY FOR DIABETIC TESTING 12/09/14  Yes Chipper Herb, MD  ULORIC 40 MG tablet TAKE 1 TABLET BY MOUTH ONCE DAILY 12/10/14  Yes Mary-Margaret Hassell Done, FNP  glucose blood (ACCU-CHEK AVIVA PLUS) test strip Test 4-5 x a day and prn dx E11.9 01/07/15   Mary-Margaret Hassell Done, FNP  glucose blood (ONETOUCH VERIO) test strip Test 4x a day and prn  E11.9 04/19/15   Mary-Margaret Hassell Done, FNP  glucose blood test strip Test 4x a day and prn  Dx E11.9 01/07/15   Mary-Margaret Hassell Done, FNP    Allergies  Allergen Reactions  . Ace Inhibitors Other (See Comments)    Kidney failure  . Niaspan [Niacin Er] Other (See Comments)    flushing    Physical Exam  Vitals  Blood pressure 171/48, pulse  79, temperature 99.3 F (37.4 C), temperature source Oral, resp. rate 26, height 5\' 2"  (1.575 m), weight 232 lb (105.235 kg), SpO2 93 %.   General:  In acute distress evidence of unrelenting low back pain radiating down both hips to the legs, appears stated age in obese  Psych:  Normal affect, Denies Suicidal or Homicidal ideations, Awake Alert, Oriented X 3. Speech and thought patterns are clear and appropriate, no apparent short term memory deficits  Neuro:   No focal neurological deficits, CN II through XII intact, Strength 5/5 all 4 extremities, Sensation intact all 4 extremities.  ENT:  Ears and Eyes appear Normal, Conjunctivae clear, PER. Moist oral mucosa without erythema or exudates.  Neck:  Supple, No lymphadenopathy appreciated  Respiratory:  Symmetrical chest wall movement, Good air movement bilaterally, CTAB. Room Air  Cardiac:  RRR, No Murmurs, no LE edema noted, no JVD, No carotid bruits, peripheral pulses palpable at 2+  Abdomen:  Positive bowel sounds, Soft, Non tender but did endorse "soreness" with deep palpation over left upper quadrant, Non distended,  No masses appreciated, no obvious hepatosplenomegaly  Skin:  No Cyanosis, Normal Skin Turgor, No Skin Rash or Bruise.  Extremities: Symmetrical without obvious trauma or injury,  no effusions.  Data Review  CBC  Recent Labs Lab 04/15/15 1246 04/20/15 0838  WBC  --  9.4  HGB 9.9* 11.2*  HCT  --  37.7  PLT  --  182  MCV  --  95.2  MCH  --  28.3  MCHC  --  29.7*  RDW  --  19.3*    Chemistries   Recent Labs Lab 04/20/15 0838  NA 140  K 5.0  CL 102  CO2 25  GLUCOSE 264*  BUN 57*  CREATININE 2.25*  CALCIUM 9.5  AST 302*  ALT 118*  ALKPHOS 43  BILITOT 2.1*    estimated creatinine clearance is 23.5  mL/min (by C-G formula based on Cr of 2.25).  No results for input(s): TSH, T4TOTAL, T3FREE, THYROIDAB in the last 72 hours.  Invalid input(s): FREET3  Coagulation profile No results for  input(s): INR, PROTIME in the last 168 hours.  No results for input(s): DDIMER in the last 72 hours.  Cardiac Enzymes No results for input(s): CKMB, TROPONINI, MYOGLOBIN in the last 168 hours.  Invalid input(s): CK  Invalid input(s): POCBNP  Urinalysis    Component Value Date/Time   COLORURINE YELLOW 04/20/2015 [place] 04/20/2015 1111   LABSPEC 1.013 04/20/2015 1111   PHURINE 5.5 04/20/2015 1111   GLUCOSEU NEGATIVE 04/20/2015 1111   HGBUR NEGATIVE 04/20/2015 1111   BILIRUBINUR NEGATIVE 04/20/2015 1111   BILIRUBINUR neg 05/21/2014 1124   KETONESUR NEGATIVE 04/20/2015 1111   PROTEINUR NEGATIVE 04/20/2015 1111   PROTEINUR neg 05/21/2014 1124   UROBILINOGEN negative 05/21/2014 1124   NITRITE POSITIVE* 04/20/2015 1111   NITRITE positive 05/21/2014 1124   LEUKOCYTESUR NEGATIVE 04/20/2015 1111    Imaging results:   Ct Abdomen Pelvis Wo Contrast  04/20/2015  CLINICAL DATA:  Post fall now with worsening low back pain. EXAM: CT ABDOMEN AND PELVIS WITHOUT CONTRAST TECHNIQUE: Multidetector CT imaging of the abdomen and pelvis was performed following the standard protocol without IV contrast. COMPARISON:  Lumbar spine MRI - 01/28/2008 FINDINGS: The lack of intravenous contrast limits the ability to evaluate solid abdominal organs. Normal hepatic contour. Radiopaque gallstones and layering sludge is seen within otherwise normal-appearing gallbladder. No definitive gallbladder wall thickening or pericholecystic fluid on this noncontrast examination. No ascites. The hepatic flexure of the colon is incidentally noted to be interposed adjacent to the anterior aspect of the right lobe of the liver. The bilateral kidneys appear mildly atrophic with diffuse cortical thinning. Otherwise, normal noncontrast appearance of the bilateral kidneys. There is a minimal amount of grossly symmetric bilateral perinephric stranding presumably age and body habitus related. No renal stones. No renal  stones are seen along the course of either ureter or the urinary bladder. Normal noncontrast appearance of the urinary bladder given degree distention. No urinary obstruction. Note is made of an approximately 2.9 x 2.2 cm right adrenal gland nodule which appears morphologically similar to the 12/2007 examination. Normal noncontrast appearance of the left adrenal gland, pancreas and spleen. Ingested enteric contrast extends to the level of the proximal descending colon. Scattered colonic diverticulosis without evidence of diverticulitis. No evidence of enteric obstruction. No pneumoperitoneum, pneumatosis or portal venous gas. Large amount of calcified atherosclerotic plaque within a normal caliber abdominal aorta. No bulky retroperitoneal, mesenteric, pelvic or inguinal lymphadenopathy. Normal appearance of the pelvic organs for age. No discrete adnexal lesion. No free fluid in the pelvic cul-de-sac. Limited visualization of the lower thorax is degraded secondary to patient respiratory artifact. No discrete focal airspace opacities. No pleural effusion. Borderline cardiomegaly. Exuberant calcifications within the mitral valve annulus. Coronary artery calcifications. No pericardial effusion. No acute or aggressive osseous abnormalities. Moderate to severe multilevel thoracolumbar spine DDD, likely worse at L4-L5 and L5-S1 with disc space height loss, endplate irregularity and sclerosis. Stigmata of DISH within the caudal aspect of the thoracic spine. Small mesenteric fat containing periumbilical hernia. Mild diffuse body wall anasarca, most conspicuous about the midline of the low back. Regional soft tissues appear otherwise normal. IMPRESSION: 1. No acute findings within the abdomen or pelvis. Specifically, no evidence of enteric or urinary obstruction. No acute osseus abnormalities. 2. Moderate severe multilevel thoracolumbar spine DDD, progressed since  the 2009 examination. 3. 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 4. Mild atrophy of the bilateral kidneys, nonspecific though could be seen in the setting of medical renal disease. Clinical correlation is advised. 5. Colonic diverticulosis without evidence of diverticulitis. 6. Borderline cardiomegaly with exuberant calcifications within the mitral valve annulus. Coronary artery calcifications. 7. Cholelithiasis without evidence of cholecystitis on this noncontrast examination. Electronically Signed   By: Sandi Mariscal M.D.   On: 04/20/2015 15:03   Dg Chest Port 1 View  04/20/2015  CLINICAL DATA:  Fever and nausea EXAM: PORTABLE CHEST 1 VIEW COMPARISON:  PA and lateral chest x-ray of January 28, 2008 FINDINGS: The lungs are adequately inflated. There are coarse lung markings in the left retrocardiac region. There is no pleural effusion. The cardiac silhouette is enlarged. The pulmonary vascularity is not engorged. There is increased soft tissue density in the left suprahilar region lying lateral to the aortic arch. The trachea is midline. The bony thorax exhibits no acute abnormality. IMPRESSION: Stable enlargement cardiac silhouette without significant pulmonary vascular congestion. Increased prominence of the soft tissues in the left hilar and suprahilar regions may be in part due to projection. However, atelectasis or infiltrate in the right upper lobe is not excluded. If the patient can tolerate the procedure, a PA and lateral chest x-ray would be useful. Electronically Signed   By: David  Martinique M.D.   On: 04/20/2015 09:33     EKG: (Independently reviewed) sinus rhythm with ventricular rate 72 bpm, QTC 451 ms, no ischemic changes   Assessment & Plan  Principal Problem:   Acute biliary pancreatitis -SDU -Lipase 876, AST 302 and ALT 118 with total bilirubin 2.1 and normal alkaline phosphatase -GI consulted -Abdominal  ultrasound -+/- Will need MRCP versus proceed with ERCP -Repeat labs in a.m. -Likely will require surgical evaluation during this admission -NPO, IVFs, pain management  Active Problems:   Diabetes mellitus, insulin dependent, uncontrolled  -On 70/25 insulin at home but since nothing by mouth we'll hold mixed Insulin and utilize SSI -May need to utilize long-acting insulin such as Lantus while acutely ill -History of hypoglycemia in the past on long-acting insulin -Continue Amaryl for now -Hemoglobin A1c    Elevated lactic acid level -Suspect related to acute infectious process and volume depletion from pancreatitis  -Continue IV fluids  -Cycle lactic acid  -Currently does not have sepsis physiology  -ck PCT    CKD, stage IV  -BUN slightly elevated higher than baseline (40/1.98) as is creatinine (57/2.25) -Given acute pancreatitis and volume losses will hold Lasix -Follow labs    Hypertension -Uncontrolled in setting of back pain  -Continue preadmission Norvasc, carvedilol and Catapres     COPD / OSA -Not on CPAP at home -Continuous pulse oximetry especially in concomitant use of IV narcotic -Continue Symbicort    Peripheral edema -Likely related to ongoing obesity  -Has never had echocardiogram so we'll order this admission     Hyperlipidemia -TriCor and Crestor on hold due to NPO and transaminitis    Diabetic neuropathy /RLS (restless legs syndrome) -Continue preadmission Neurontin    Chronic pain syndrome -Current low back pain not typical for her usual chronic pain and may be more reflective of her acute pancreatitis with atypical pain presentation  -Oral pain medications on hold in favor of IV morphine     Anemia, iron deficiency -Hemoglobin  stable and at baseline  -Holding iron  -Reports constipation with use of iron pills     DVT Prophylaxis: renal dose adjusted Lovenox  Family Communication:   Daughters at bedside  Code Status:  DO NOT  RESUSCITATE  Condition:  Stable  Discharge disposition: anticipate discharge back to home environment once medically stable and pending PT/OT evaluation  Time spent in minutes : 60      Trishna Cwik L. ANP on 04/20/2015 at 4:58 PM  You may contact me by going to [email or web address] - password [email or web address]  I am available from 7a-7p but please confirm I am on the schedule by going to Amion as above.   After 7p please contact night coverage person covering me after hours  [facility]

## 2015-04-20 NOTE — ED Provider Notes (Signed)
CSN: [id number]     Arrival date & time 04/20/15  0815 History   First MD Initiated Contact with Patient 04/20/15 [phone]     Chief Complaint  Patient presents with  . Nausea     (Consider location/radiation/quality/duration/timing/severity/associated sxs/prior Treatment) HPI Comments: The patient is a 80 year old female, she has a known history of hypertension, COPD as well as diabetes.  She states that she was in her usual state of health until this morning when she awoke with a feeling of nausea, she had one episode of vomiting. The patient states that after getting Zofran by the paramedics her symptoms have resolved and now she is hungry and not having any symptoms. She denies abdominal pain, chest pain, shortness of breath, palpitations, headache. She has no dysuria, diarrhea, swelling, rashes or any other symptoms. The nausea has now resolved, Zofran improved her symptoms.  The history is provided by the patient.    Past Medical History  Diagnosis Date  . Hypertension   . COPD (chronic obstructive pulmonary disease) ([place])   . Diabetes mellitus without complication ([place])   . Neuromuscular disorder ([place])   . Depression with anxiety   . Osteopenia   . Arthritis   . GERD (gastroesophageal reflux disease)   . Hyperlipidemia   . Obesity   . Vitamin B12 deficiency   . Chronic kidney disease (CKD), stage IV (severe) ([place])   . Sleep apnea     no cpap use  . Anemia     iron infusion- x2 recent , last 5'16  . Hx of adenomatous colonic polyps 09/2014   Past Surgical History  Procedure Laterality Date  . Breast surgery  April 1996    needle biopsy   . Tubal ligation    . Cataract extraction, bilateral Bilateral   . Esophagogastroduodenoscopy N/A 10/19/2014    Procedure: ESOPHAGOGASTRODUODENOSCOPY (EGD);  Surgeon: Gatha Mayer, MD;  Location: [facility];  Service: Endoscopy;  Laterality: N/A;  . Colonoscopy N/A 10/19/2014    Procedure: COLONOSCOPY;  Surgeon: Gatha Mayer, MD;   Location: [facility];  Service: Endoscopy;  Laterality: N/A;   Family History  Problem Relation Age of Onset  . Hypertension Mother   . Hypertension Father   . Diabetes Father   . COPD Father   . Diabetes Sister     x 2  . Colon cancer Neg Hx   . Colon polyps Neg Hx   . Skin cancer Sister   . Testicular cancer Brother   . Esophageal cancer Neg Hx    Social History  Substance Use Topics  . Smoking status: Former Smoker    Types: Cigarettes    Quit date: 05/01/2010  . Smokeless tobacco: None  . Alcohol Use: No   OB History    No data available     Review of Systems  All other systems reviewed and are negative.     Allergies  Ace inhibitors and Niaspan  Home Medications   Prior to Admission medications   Medication Sig Start Date End Date Taking? Authorizing Provider  Alcohol Swabs (PHARMACIST CHOICE ALCOHOL) PADS USE 2 TIMES DAILY FOR DIABETIC TESTING 12/09/14  Yes Chipper Herb, MD  amLODipine (NORVASC) 5 MG tablet Take 1 tablet (5 mg total) by mouth daily. 11/26/14  Yes Mary-Margaret Hassell Done, FNP  antiseptic oral rinse (BIOTENE) LIQD 15 mLs by Mouth Rinse route as needed for dry mouth.   Yes Historical Provider, MD  aspirin 81 MG tablet Take 81 mg by mouth daily.  Yes Historical Provider, MD  B-D UF III MINI PEN NEEDLES 31G X 5 MM MISC USE TWICE A DAY TO INJECT NOVOLOG MIX 70/30 INSULIN 03/11/13  Yes Mary-Margaret Hassell Done, FNP  budesonide-formoterol (SYMBICORT) 80-4.5 MCG/ACT inhaler Inhale 1 puff into the lungs 2 (two) times daily. 11/26/14  Yes Mary-Margaret Hassell Done, FNP  carvedilol (COREG) 25 MG tablet TAKE 1 TABLET BY MOUTH TWICE DAILY WITH A MEAL 12/10/14  Yes Mary-Margaret Hassell Done, FNP  citalopram (CELEXA) 20 MG tablet TAKE 1 TABLET BY MOUTH EVERY DAY 12/10/14  Yes Mary-Margaret Hassell Done, FNP  cloNIDine (CATAPRES) 0.2 MG tablet TAKE 1 TABLET BY MOUTH TWICE DAILY 12/10/14  Yes Mary-Margaret Hassell Done, FNP  CRESTOR 20 MG tablet TAKE 1 TABLET BY MOUTH EVERY DAY 02/05/15   Yes Mary-Margaret Hassell Done, FNP  EASY TOUCH INSULIN SYRINGE 31G X 5/16" 1 ML MISC USE TO INJECT INSULIN TWICE DAILY 12/10/12  Yes Mary-Margaret Hassell Done, FNP  EASY TOUCH PEN NEEDLES 31G X 8 MM MISC USE TWICE DAILY 03/05/15  Yes Mary-Margaret Hassell Done, FNP  febuxostat (ULORIC) 40 MG tablet Take 1 tablet (40 mg total) by mouth daily. 11/26/14  Yes Mary-Margaret Hassell Done, FNP  fenofibrate (TRICOR) 145 MG tablet Take 1 tablet (145 mg total) by mouth daily. 11/26/14  Yes Mary-Margaret Hassell Done, FNP  Ferrous Sulfate (IRON) 325 (65 FE) MG TABS Take 1 tablet by mouth daily. Patient taking differently: Take 1 tablet by mouth 2 (two) times daily.  03/08/15  Yes Chipper Herb, MD  furosemide (LASIX) 40 MG tablet TAKE 1 TABLET BY MOUTH EVERY MORNING AND TAKE 1/2 TABLET BY MOUTH AT LUNCH 01/08/15  Yes Mary-Margaret Hassell Done, FNP  gabapentin (NEURONTIN) 300 MG capsule TAKE 1 CAPSULE BY MOUTH FOUR TIMES A DAY 03/26/15  Yes Mary-Margaret Hassell Done, FNP  glimepiride (AMARYL) 4 MG tablet TAKE 1/2 TABLET BY MOUTH EVERY DAY BEFORE BREAKFAST Patient taking differently: Take 2 mg by mouth daily with breakfast.  04/02/15  Yes Mary-Margaret Hassell Done, FNP  Insulin Lispro Prot & Lispro (HUMALOG MIX 75/25 KWIKPEN) (75-25) 100 UNIT/ML Kwikpen 55 u in am and 65u in pm 04/15/15  Yes Mary-Margaret Hassell Done, FNP  nystatin cream (MYCOSTATIN) APPLY TOPICALLY TWO TIMES DAILY. 03/26/15  Yes Mary-Margaret Hassell Done, FNP  omeprazole (PRILOSEC) 40 MG capsule TAKE 1 CAPSULE BY MOUTH EVERY DAY 11/26/14  Yes Mary-Margaret Hassell Done, FNP  oxyCODONE-acetaminophen (PERCOCET) 10-325 MG tablet Take 1 tablet by mouth 2 (two) times daily. 04/02/15  Yes Mary-Margaret Hassell Done, FNP  PHARMACIST CHOICE LANCETS MISC USE 2 TIMES DAILY FOR DIABETIC TESTING 12/09/14  Yes Chipper Herb, MD  ULORIC 40 MG tablet TAKE 1 TABLET BY MOUTH ONCE DAILY 12/10/14  Yes Mary-Margaret Hassell Done, FNP  glucose blood (ACCU-CHEK AVIVA PLUS) test strip Test 4-5 x a day and prn dx E11.9 01/07/15   Mary-Margaret  Hassell Done, FNP  glucose blood (ONETOUCH VERIO) test strip Test 4x a day and prn  E11.9 04/19/15   Mary-Margaret Hassell Done, FNP  glucose blood test strip Test 4x a day and prn  Dx E11.9 01/07/15   Mary-Margaret Hassell Done, FNP   BP 185/62 mmHg  Pulse 72  Temp(Src) 99.3 F (37.4 C) (Oral)  Resp 21  Ht 5\' 2"  (1.575 m)  Wt 232 lb (105.235 kg)  BMI 42.42 kg/m2  SpO2 95% Physical Exam  Constitutional: She appears well-developed and well-nourished. No distress.  HENT:  Head: Normocephalic and atraumatic.  Mouth/Throat: Oropharynx is clear and moist. No oropharyngeal exudate.  Eyes: Conjunctivae and EOM are normal. Pupils are equal, round, and reactive to light. Right eye  exhibits no discharge. Left eye exhibits no discharge. No scleral icterus.  Neck: Normal range of motion. Neck supple. No JVD present. No thyromegaly present.  Cardiovascular: Normal rate, regular rhythm, normal heart sounds and intact distal pulses.  Exam reveals no gallop and no friction rub.   No murmur heard. Pulmonary/Chest: Effort normal and breath sounds normal. No respiratory distress. She has no wheezes. She has no rales.  Abdominal: Soft. Bowel sounds are normal. She exhibits no distension and no mass. There is no tenderness.  Musculoskeletal: Normal range of motion. She exhibits no edema or tenderness.  Lymphadenopathy:    She has no cervical adenopathy.  Neurological: She is alert. Coordination normal.  Skin: Skin is warm and dry. No rash noted. No erythema.  Psychiatric: She has a normal mood and affect. Her behavior is normal.  Nursing note and vitals reviewed.   ED Course  Procedures (including critical care time) Labs Review Labs Reviewed  LIPASE, BLOOD - Abnormal; Notable for the following:    Lipase 876 (*)    All other components within normal limits  COMPREHENSIVE METABOLIC PANEL - Abnormal; Notable for the following:    Glucose, Bld 264 (*)    BUN 57 (*)    Creatinine, Ser 2.25 (*)    Total Protein 6.3  (*)    Albumin 3.4 (*)    AST 302 (*)    ALT 118 (*)    Total Bilirubin 2.1 (*)    GFR calc non Af Amer 20 (*)    GFR calc Af Amer 23 (*)    All other components within normal limits  CBC - Abnormal; Notable for the following:    Hemoglobin 11.2 (*)    MCHC 29.7 (*)    RDW 19.3 (*)    All other components within normal limits  URINALYSIS, ROUTINE W REFLEX MICROSCOPIC (NOT AT [facility]) - Abnormal; Notable for the following:    Nitrite POSITIVE (*)    All other components within normal limits  URINE MICROSCOPIC-ADD ON - Abnormal; Notable for the following:    Squamous Epithelial / LPF 0-5 (*)    Bacteria, UA MANY (*)    All other components within normal limits  I-STAT CG4 LACTIC ACID, ED - Abnormal; Notable for the following:    Lactic Acid, Venous 2.85 (*)    All other components within normal limits  I-STAT CG4 LACTIC ACID, ED - Abnormal; Notable for the following:    Lactic Acid, Venous 2.88 (*)    All other components within normal limits  CULTURE, BLOOD (ROUTINE X 2)  CULTURE, BLOOD (ROUTINE X 2)  URINE CULTURE  LACTIC ACID, PLASMA  LACTIC ACID, PLASMA  HEMOGLOBIN A1C    Imaging Review Ct Abdomen Pelvis Wo Contrast  04/20/2015  CLINICAL DATA:  Post fall now with worsening low back pain. EXAM: CT ABDOMEN AND PELVIS WITHOUT CONTRAST TECHNIQUE: Multidetector CT imaging of the abdomen and pelvis was performed following the standard protocol without IV contrast. COMPARISON:  Lumbar spine MRI - 01/28/2008 FINDINGS: The lack of intravenous contrast limits the ability to evaluate solid abdominal organs. Normal hepatic contour. Radiopaque gallstones and layering sludge is seen within otherwise normal-appearing gallbladder. No definitive gallbladder wall thickening or pericholecystic fluid on this noncontrast examination. No ascites. The hepatic flexure of the colon is incidentally noted to be interposed adjacent to the anterior aspect of the right lobe of the liver. The bilateral kidneys  appear mildly atrophic with diffuse cortical thinning. Otherwise, normal noncontrast appearance of the  bilateral kidneys. There is a minimal amount of grossly symmetric bilateral perinephric stranding presumably age and body habitus related. No renal stones. No renal stones are seen along the course of either ureter or the urinary bladder. Normal noncontrast appearance of the urinary bladder given degree distention. No urinary obstruction. Note is made of an approximately 2.9 x 2.2 cm right adrenal gland nodule which appears morphologically similar to the 12/2007 examination. Normal noncontrast appearance of the left adrenal gland, pancreas and spleen. Ingested enteric contrast extends to the level of the proximal descending colon. Scattered colonic diverticulosis without evidence of diverticulitis. No evidence of enteric obstruction. No pneumoperitoneum, pneumatosis or portal venous gas. Large amount of calcified atherosclerotic plaque within a normal caliber abdominal aorta. No bulky retroperitoneal, mesenteric, pelvic or inguinal lymphadenopathy. Normal appearance of the pelvic organs for age. No discrete adnexal lesion. No free fluid in the pelvic cul-de-sac. Limited visualization of the lower thorax is degraded secondary to patient respiratory artifact. No discrete focal airspace opacities. No pleural effusion. Borderline cardiomegaly. Exuberant calcifications within the mitral valve annulus. Coronary artery calcifications. No pericardial effusion. No acute or aggressive osseous abnormalities. Moderate to severe multilevel thoracolumbar spine DDD, likely worse at L4-L5 and L5-S1 with disc space height loss, endplate irregularity and sclerosis. Stigmata of DISH within the caudal aspect of the thoracic spine. Small mesenteric fat containing periumbilical hernia. Mild diffuse body wall anasarca, most conspicuous about the midline of the low back. Regional soft tissues appear otherwise normal. IMPRESSION: 1. No  acute findings within the abdomen or pelvis. Specifically, no evidence of enteric or urinary obstruction. No acute osseus abnormalities. 2. Moderate severe multilevel thoracolumbar spine DDD, progressed since the 2009 examination. 3. 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 4. Mild atrophy of the bilateral kidneys, nonspecific though could be seen in the setting of medical renal disease. Clinical correlation is advised. 5. Colonic diverticulosis without evidence of diverticulitis. 6. Borderline cardiomegaly with exuberant calcifications within the mitral valve annulus. Coronary artery calcifications. 7. Cholelithiasis without evidence of cholecystitis on this noncontrast examination. Electronically Signed   By: Sandi Mariscal M.D.   On: 04/20/2015 15:03   Dg Chest Port 1 View  04/20/2015  CLINICAL DATA:  Fever and nausea EXAM: PORTABLE CHEST 1 VIEW COMPARISON:  PA and lateral chest x-ray of January 28, 2008 FINDINGS: The lungs are adequately inflated. There are coarse lung markings in the left retrocardiac region. There is no pleural effusion. The cardiac silhouette is enlarged. The pulmonary vascularity is not engorged. There is increased soft tissue density in the left suprahilar region lying lateral to the aortic arch. The trachea is midline. The bony thorax exhibits no acute abnormality. IMPRESSION: Stable enlargement cardiac silhouette without significant pulmonary vascular congestion. Increased prominence of the soft tissues in the left hilar and suprahilar regions may be in part due to projection. However, atelectasis or infiltrate in the right upper lobe is not excluded. If the patient can tolerate the procedure, a PA and lateral chest x-ray would be useful. Electronically Signed   By: David  Martinique M.D.   On: 04/20/2015 09:33   I have personally reviewed and  evaluated these images and lab results as part of my medical decision-making.   EKG Interpretation   Date/Time:  Tuesday April 20 2015 08:22:24 EST Ventricular Rate:  72 PR Interval:  217 QRS Duration: 94 QT Interval:  412 QTC  Calculation: 451 R Axis:   49 Text Interpretation:  Sinus rhythm Borderline prolonged PR interval ECG  OTHERWISE WITHIN NORMAL LIMITS since last tracing no significant change  Confirmed by Rosevelt Luu  MD, Kaslyn [place] ([id number]) on 04/20/2015 10:39:20 AM      MDM   Final diagnoses:  Acute pancreatitis    The only abnormal finding is a dry mouth, she is otherwise well-appearing with vital signs suggestive of only mild hypertension and a low-grade fever of 100.5. Will proceed with evaluation for the source of the fever and nausea. She will need urinalysis, chest x-ray, labs, the patient has no other symptoms to suggest a source.  Lactic acid of 2.8, white blood cell count of 9400, normal hemoglobin, creatinine is gradually increasing to 2.2, liver function tests are elevated as well as the lipase suggestive of a gallstone pancreatitis. She does not have a urinary tract infection, I am concerned that she has an intra-abdominal infection. Because of the fever and her abdominal pain will hold antibiotics as there is no definite source of infection, the CT scan does not show necrotizing pancreatitis or other intra-abdominal pathology.  The patient does not appear septic.  D/w [facility] who will see in constulation D/w hospitalist who will admit.  Meds given in ED:  Medications  Barium Sulfate 2.1 % SUSP (not administered)  0.9 %  sodium chloride infusion (not administered)  morphine 2 MG/ML injection 1-2 mg (not administered)  ondansetron (ZOFRAN) injection 4 mg (not administered)  insulin aspart (novoLOG) injection 0-15 Units (not administered)  sodium chloride 0.9 % bolus 1,000 mL (0 mLs Intravenous Stopped 04/20/15 1111)  fentaNYL (SUBLIMAZE) injection 50 mcg (50  mcg Intravenous Given 04/20/15 1112)  fentaNYL (SUBLIMAZE) injection 50 mcg (50 mcg Intravenous Given 04/20/15 1246)  HYDROmorphone (DILAUDID) injection 0.5 mg (0.5 mg Intravenous Given 04/20/15 1539)    New Prescriptions   No medications on file        Noemi Chapel, MD 04/20/15 [phone]

## 2015-04-21 ENCOUNTER — Inpatient Hospital Stay (HOSPITAL_COMMUNITY): Payer: Medicare Other

## 2015-04-21 DIAGNOSIS — I159 Secondary hypertension, unspecified: Secondary | ICD-10-CM

## 2015-04-21 DIAGNOSIS — N179 Acute kidney failure, unspecified: Secondary | ICD-10-CM

## 2015-04-21 DIAGNOSIS — Z01810 Encounter for preprocedural cardiovascular examination: Secondary | ICD-10-CM

## 2015-04-21 LAB — COMPREHENSIVE METABOLIC PANEL
ALBUMIN: 2.8 g/dL — AB (ref 3.5–5.0)
ALK PHOS: 39 U/L (ref 38–126)
ALT: 115 U/L — AB (ref 14–54)
ANION GAP: 8 (ref 5–15)
AST: 178 U/L — ABNORMAL HIGH (ref 15–41)
BILIRUBIN TOTAL: 2.9 mg/dL — AB (ref 0.3–1.2)
BUN: 40 mg/dL — AB (ref 6–20)
CALCIUM: 8.7 mg/dL — AB (ref 8.9–10.3)
CO2: 26 mmol/L (ref 22–32)
CREATININE: 1.85 mg/dL — AB (ref 0.44–1.00)
Chloride: 106 mmol/L (ref 101–111)
GFR calc Af Amer: 29 mL/min — ABNORMAL LOW (ref >60)
GFR calc non Af Amer: 25 mL/min — ABNORMAL LOW (ref >60)
GLUCOSE: 227 mg/dL — AB (ref 65–99)
Potassium: 4.3 mmol/L (ref 3.5–5.1)
SODIUM: 140 mmol/L (ref 135–145)
TOTAL PROTEIN: 5.7 g/dL — AB (ref 6.5–8.1)

## 2015-04-21 LAB — URINE CULTURE

## 2015-04-21 LAB — GLUCOSE, CAPILLARY
GLUCOSE-CAPILLARY: 182 mg/dL — AB (ref 65–99)
GLUCOSE-CAPILLARY: 191 mg/dL — AB (ref 65–99)
GLUCOSE-CAPILLARY: 204 mg/dL — AB (ref 65–99)
GLUCOSE-CAPILLARY: 205 mg/dL — AB (ref 65–99)
GLUCOSE-CAPILLARY: 211 mg/dL — AB (ref 65–99)
Glucose-Capillary: 186 mg/dL — ABNORMAL HIGH (ref 65–99)

## 2015-04-21 LAB — CBC
HCT: 34.9 % — ABNORMAL LOW (ref 36.0–46.0)
Hemoglobin: 10.8 g/dL — ABNORMAL LOW (ref 12.0–15.0)
MCH: 29.5 pg (ref 26.0–34.0)
MCHC: 30.9 g/dL (ref 30.0–36.0)
MCV: 95.4 fL (ref 78.0–100.0)
PLATELETS: 154 10*3/uL (ref 150–400)
RBC: 3.66 MIL/uL — ABNORMAL LOW (ref 3.87–5.11)
RDW: 19.3 % — AB (ref 11.5–15.5)
WBC: 6.9 10*3/uL (ref 4.0–10.5)

## 2015-04-21 LAB — HEMOGLOBIN A1C
Hgb A1c MFr Bld: 6.3 % — ABNORMAL HIGH (ref 4.8–5.6)
MEAN PLASMA GLUCOSE: 134 mg/dL

## 2015-04-21 LAB — LIPASE, BLOOD: Lipase: 65 U/L — ABNORMAL HIGH (ref 11–51)

## 2015-04-21 MED ORDER — HYDRALAZINE HCL 20 MG/ML IJ SOLN
10.0000 mg | Freq: Four times a day (QID) | INTRAMUSCULAR | Status: DC | PRN
Start: 2015-04-21 — End: 2015-04-25
  Administered 2015-04-21 – 2015-04-25 (×3): 10 mg via INTRAVENOUS
  Filled 2015-04-21 (×2): qty 1

## 2015-04-21 MED ORDER — ENOXAPARIN SODIUM 60 MG/0.6ML ~~LOC~~ SOLN: 50.0000 mg | SUBCUTANEOUS | Status: DC

## 2015-04-21 MED ORDER — ALBUTEROL SULFATE (2.5 MG/3ML) 0.083% IN NEBU
2.5000 mg | INHALATION_SOLUTION | RESPIRATORY_TRACT | Status: DC | PRN
  Administered 2015-04-23: 2.5 mg via RESPIRATORY_TRACT
  Filled 2015-04-21: qty 3

## 2015-04-21 MED ORDER — SODIUM CHLORIDE 0.9 % IV SOLN
1.5000 g | Freq: Three times a day (TID) | INTRAVENOUS | Status: DC
  Administered 2015-04-21 – 2015-04-23 (×4): 1.5 g via INTRAVENOUS
  Filled 2015-04-21 (×6): qty 1.5

## 2015-04-21 MED ORDER — SODIUM CHLORIDE 0.9 % IV SOLN: 3.0000 g | Freq: Once | INTRAVENOUS | Status: DC

## 2015-04-21 NOTE — Consult Note (Signed)
Reason for Consult:  Gallstone pancreatitis Referring Physician: Dr. Nena Alexander  Anna [name] is an 80 y.o. female.  HPI: pt presented to ED on 04/20/15 with nausea that started the evening before.  She had some dry heaves and some mucus like emesis the AM of admit.  Work up shows TM 99.3, VSS. Creatinine of 2.25, lipase of 876, LFT's up some, elevated glucose 264, lactate of 2.88, possible UTI.  CXR shows significant pulmonary vascular congestion, possible infiltrate RUL.  CT scan shows cholelithiasis without evidence of cholecystitis, on non contrast study.  US shows:  Gallbladder: Well distended with multiple mobile gallstones. No gallbladder wall thickening or pericholecystic fluid is noted.  Common bile duct: Diameter: 9.4 mm.  Liver: No focal lesion identified. Within normal limits in parenchymal echogenicity.  MRI shows:  Gallstones. The common bile duct is increased in caliber and there is a stone within the distal CBD.   Hepatic steatosis  Right adrenal nodule. Susceptibility artifact noted suggesting previous hemorrhage. This does not appear significantly changed from 01/21/2008 compatible with a benign process.  She has been seen by GI and is scheduled for ERCP tomorrow at 1300 hours.    We are ask to see for possible cholecystectomy.  Past Medical History  Diagnosis Date  Hypertension   COPD (chronic obstructive pulmonary disease) ([place])   Diabetes mellitus without complication on insulin    Peripheral Neuromuscular disorder    Sleep apnea   Chronic kidney disease (CKD), stage IV (severe) (HCC)   Anemia    iron infusion- x2 recent , last 5'16     Depression with anxiety   Osteopenia   Arthritis   GERD (gastroesophageal reflux disease)   Hyperlipidemia   Obesity  Body mass index is 39.5     Hx of tobacco use  Quit 05/01/10   Vitamin B12 deficiency   Hx of adenomatous colonic polyps 09/2014     Past Surgical History  Procedure Laterality Date  . Breast surgery  April 1996     needle biopsy   . Tubal ligation    . Cataract extraction, bilateral Bilateral   . Esophagogastroduodenoscopy N/A 10/19/2014    Procedure: ESOPHAGOGASTRODUODENOSCOPY (EGD);  Surgeon: Gatha Mayer, MD;  Location: [facility];  Service: Endoscopy;  Laterality: N/A;  . Colonoscopy N/A 10/19/2014    Procedure: COLONOSCOPY;  Surgeon: Gatha Mayer, MD;  Location: [facility];  Service: Endoscopy;  Laterality: N/A;    Family History  Problem Relation Age of Onset  . Hypertension Mother   . Hypertension Father   . Diabetes Father   . COPD Father   . Diabetes Sister     x 2  . Colon cancer Neg Hx   . Colon polyps Neg Hx   . Skin cancer Sister   . Testicular cancer Brother   . Esophageal cancer Neg Hx     Social History:  reports that she quit smoking about 4 years ago. Her smoking use included Cigarettes. She does not have any smokeless tobacco history on file. She reports that she does not drink alcohol or use illicit drugs. Tobacco:  2PPD no one certain how long, 20 plus years ETOH:  None Drugs:  None Worked cotton mills, farming  Allergies: Allergies  Allergen Reactions  . Ace Inhibitors Other (See Comments)    Kidney failure  . Niaspan [Niacin Er] Other (See Comments)    flushing    Medications:  Prior to Admission:  Prescriptions prior to admission  Medication Sig  Dispense Refill Last Dose  . Alcohol Swabs (PHARMACIST CHOICE ALCOHOL) PADS USE 2 TIMES DAILY FOR DIABETIC TESTING 200 each 2 Taking  . amLODipine (NORVASC) 5 MG tablet Take 1 tablet (5 mg total) by mouth daily. 30 tablet 5 04/19/2015 at Unknown time  . antiseptic oral rinse (BIOTENE) LIQD 15 mLs by Mouth Rinse route as needed for dry mouth.   04/19/2015 at Unknown time  . aspirin 81 MG tablet Take 81 mg by mouth daily.     04/19/2015 at Unknown time  . B-D UF III MINI PEN NEEDLES 31G X 5 MM MISC USE TWICE A DAY TO INJECT NOVOLOG MIX 70/30 INSULIN 100 each PRN Taking  . budesonide-formoterol (SYMBICORT)  80-4.5 MCG/ACT inhaler Inhale 1 puff into the lungs 2 (two) times daily. 1 Inhaler 5 04/19/2015 at Unknown time  . carvedilol (COREG) 25 MG tablet TAKE 1 TABLET BY MOUTH TWICE DAILY WITH A MEAL 60 tablet 5 04/19/2015 at 1730  . citalopram (CELEXA) 20 MG tablet TAKE 1 TABLET BY MOUTH EVERY DAY 30 tablet 5 04/19/2015 at Unknown time  . cloNIDine (CATAPRES) 0.2 MG tablet TAKE 1 TABLET BY MOUTH TWICE DAILY 60 tablet 5 04/19/2015 at Unknown time  . CRESTOR 20 MG tablet TAKE 1 TABLET BY MOUTH EVERY DAY 30 tablet 2 04/19/2015 at Unknown time  . EASY TOUCH INSULIN SYRINGE 31G X 5/16" 1 ML MISC USE TO INJECT INSULIN TWICE DAILY 100 each 2 Taking  . EASY TOUCH PEN NEEDLES 31G X 8 MM MISC USE TWICE DAILY 100 each 0 Taking  . febuxostat (ULORIC) 40 MG tablet Take 1 tablet (40 mg total) by mouth daily. 30 tablet 3 04/19/2015 at Unknown time  . fenofibrate (TRICOR) 145 MG tablet Take 1 tablet (145 mg total) by mouth daily. 30 tablet 5 04/19/2015 at Unknown time  . Ferrous Sulfate (IRON) 325 (65 FE) MG TABS Take 1 tablet by mouth daily. (Patient taking differently: Take 1 tablet by mouth 2 (two) times daily. ) 30 each 0 04/19/2015 at Unknown time  . furosemide (LASIX) 40 MG tablet TAKE 1 TABLET BY MOUTH EVERY MORNING AND TAKE 1/2 TABLET BY MOUTH AT LUNCH 45 tablet 3 04/19/2015 at Unknown time  . gabapentin (NEURONTIN) 300 MG capsule TAKE 1 CAPSULE BY MOUTH FOUR TIMES A DAY 120 capsule 1 04/19/2015 at Unknown time  . glimepiride (AMARYL) 4 MG tablet TAKE 1/2 TABLET BY MOUTH EVERY DAY BEFORE BREAKFAST (Patient taking differently: Take 2 mg by mouth daily with breakfast. ) 30 tablet 5 04/19/2015 at Unknown time  . Insulin Lispro Prot & Lispro (HUMALOG MIX 75/25 KWIKPEN) (75-25) 100 UNIT/ML Kwikpen 55 u in am and 65u in pm 12 pen 11 04/19/2015 at Unknown time  . nystatin cream (MYCOSTATIN) APPLY TOPICALLY TWO TIMES DAILY. 30 g 1 04/19/2015 at Unknown time  . omeprazole (PRILOSEC) 40 MG capsule TAKE 1 CAPSULE BY MOUTH  EVERY DAY 30 capsule 5 04/19/2015 at Unknown time  . oxyCODONE-acetaminophen (PERCOCET) 10-325 MG tablet Take 1 tablet by mouth 2 (two) times daily. 60 tablet 0 04/19/2015 at Unknown time  . PHARMACIST CHOICE LANCETS MISC USE 2 TIMES DAILY FOR DIABETIC TESTING 200 each 2 Taking  . ULORIC 40 MG tablet TAKE 1 TABLET BY MOUTH ONCE DAILY 30 tablet 5 04/20/2015 at Unknown time  . glucose blood (ACCU-CHEK AVIVA PLUS) test strip Test 4-5 x a day and prn dx E11.9 450 each 12 Taking  . glucose blood (ONETOUCH VERIO) test strip Test 4x a day and  prn  E11.9 300 each 12   . glucose blood test strip Test 4x a day and prn  Dx E11.9 300 each 12 Taking   Scheduled: . amLODipine  5 mg Oral Daily  . ampicillin-sulbactam (UNASYN) IV  1.5 g Intravenous Q8H  . budesonide-formoterol  1 puff Inhalation BID  . carvedilol  12.5 mg Oral BID WC  . citalopram  20 mg Oral Daily  . cloNIDine  0.2 mg Oral BID  . gabapentin  300 mg Oral QID  . glimepiride  2 mg Oral Q breakfast  . insulin aspart  0-15 Units Subcutaneous 6 times per day  . pantoprazole  40 mg Oral Daily  . sodium chloride  3 mL Intravenous Q12H   Continuous: . sodium chloride 75 mL/hr at 04/21/15 1456   PFX:[id number] **OR** acetaminophen, albuterol, hydrALAZINE, morphine injection, ondansetron (ZOFRAN) IV Anti-infectives    Start     Dose/Rate Route Frequency Ordered Stop   04/21/15 1600  ampicillin-sulbactam (UNASYN) 1.5 g in sodium chloride 0.9 % 50 mL IVPB     1.5 g 100 mL/hr over 30 Minutes Intravenous Every 8 hours 04/21/15 1511     04/21/15 1515  Ampicillin-Sulbactam (UNASYN) 3 g in sodium chloride 0.9 % 100 mL IVPB  Status:  Discontinued     3 g 100 mL/hr over 60 Minutes Intravenous  Once 04/21/15 1501 04/21/15 1502      Results for orders placed or performed during the hospital encounter of 04/20/15 (from the past 48 hour(s))  Lipase, blood     Status: Abnormal   Collection Time: 04/20/15  8:38 AM  Result Value Ref Range   Lipase  876 (H) 11 - 51 U/L    Comment: RESULTS CONFIRMED BY MANUAL DILUTION  Comprehensive metabolic panel     Status: Abnormal   Collection Time: 04/20/15  8:38 AM  Result Value Ref Range   Sodium 140 135 - 145 mmol/L   Potassium 5.0 3.5 - 5.1 mmol/L   Chloride 102 101 - 111 mmol/L   CO2 25 22 - 32 mmol/L   Glucose, Bld 264 (H) 65 - 99 mg/dL   BUN 57 (H) 6 - 20 mg/dL   Creatinine, Ser 2.25 (H) 0.44 - 1.00 mg/dL   Calcium 9.5 8.9 - 10.3 mg/dL   Total Protein 6.3 (L) 6.5 - 8.1 g/dL   Albumin 3.4 (L) 3.5 - 5.0 g/dL   AST 302 (H) 15 - 41 U/L   ALT 118 (H) 14 - 54 U/L   Alkaline Phosphatase 43 38 - 126 U/L   Total Bilirubin 2.1 (H) 0.3 - 1.2 mg/dL   GFR calc non Af Amer 20 (L) >60 mL/min   GFR calc Af Amer 23 (L) >60 mL/min    Comment: (NOTE) The eGFR has been calculated using the CKD EPI equation. This calculation has not been validated in all clinical situations. eGFR's persistently <60 mL/min signify possible Chronic Kidney Disease.    Anion gap 13 5 - 15  CBC     Status: Abnormal   Collection Time: 04/20/15  8:38 AM  Result Value Ref Range   WBC 9.4 4.0 - 10.5 K/uL   RBC 3.96 3.87 - 5.11 MIL/uL   Hemoglobin 11.2 (L) 12.0 - 15.0 g/dL   HCT 37.7 36.0 - 46.0 %   MCV 95.2 78.0 - 100.0 fL   MCH 28.3 26.0 - 34.0 pg   MCHC 29.7 (L) 30.0 - 36.0 g/dL   RDW 19.3 (H) 11.5 - 15.5 %  Platelets 182 150 - 400 K/uL  Blood Culture (routine x 2)     Status: None (Preliminary result)   Collection Time: 04/20/15  8:50 AM  Result Value Ref Range   Specimen Description BLOOD RIGHT ANTECUBITAL    Special Requests BOTTLES DRAWN AEROBIC AND ANAEROBIC 10MLS    Culture NO GROWTH 1 DAY    Report Status PENDING   I-Stat CG4 Lactic Acid, ED  (not at  [facility])     Status: Abnormal   Collection Time: 04/20/15  8:54 AM  Result Value Ref Range   Lactic Acid, Venous 2.85 (HH) 0.5 - 2.0 mmol/L   Comment NOTIFIED PHYSICIAN   Blood Culture (routine x 2)     Status: None (Preliminary result)   Collection Time:  04/20/15  9:05 AM  Result Value Ref Range   Specimen Description BLOOD RIGHT ANTECUBITAL    Special Requests BOTTLES DRAWN AEROBIC AND ANAEROBIC 10MLS    Culture NO GROWTH 1 DAY    Report Status PENDING   Urinalysis, Routine w reflex microscopic (not at [facility])     Status: Abnormal   Collection Time: 04/20/15 11:11 AM  Result Value Ref Range   Color, Urine YELLOW YELLOW   APPearance CLEAR CLEAR   Specific Gravity, Urine 1.013 1.005 - 1.030   pH 5.5 5.0 - 8.0   Glucose, UA NEGATIVE NEGATIVE mg/dL   Hgb urine dipstick NEGATIVE NEGATIVE   Bilirubin Urine NEGATIVE NEGATIVE   Ketones, ur NEGATIVE NEGATIVE mg/dL   Protein, ur NEGATIVE NEGATIVE mg/dL   Nitrite POSITIVE (A) NEGATIVE   Leukocytes, UA NEGATIVE NEGATIVE  Urine culture     Status: None   Collection Time: 04/20/15 11:11 AM  Result Value Ref Range   Specimen Description URINE, CATHETERIZED    Special Requests NONE    Culture      >=100,000 COLONIES/mL GROUP B STREP(S.AGALACTIAE)ISOLATED TESTING AGAINST S. AGALACTIAE NOT ROUTINELY PERFORMED DUE TO PREDICTABILITY OF AMP/PEN/VAN SUSCEPTIBILITY.    Report Status 04/21/2015 FINAL   Urine microscopic-add on     Status: Abnormal   Collection Time: 04/20/15 11:11 AM  Result Value Ref Range   Squamous Epithelial / LPF 0-5 (A) NONE SEEN   WBC, UA 0-5 0 - 5 WBC/hpf   RBC / HPF 0-5 0 - 5 RBC/hpf   Bacteria, UA MANY (A) NONE SEEN  I-Stat CG4 Lactic Acid, ED  (not at  [facility])     Status: Abnormal   Collection Time: 04/20/15 12:00 PM  Result Value Ref Range   Lactic Acid, Venous 2.88 (HH) 0.5 - 2.0 mmol/L   Comment NOTIFIED PHYSICIAN   Lactic acid, plasma     Status: None   Collection Time: 04/20/15  6:33 PM  Result Value Ref Range   Lactic Acid, Venous 1.8 0.5 - 2.0 mmol/L  Hemoglobin A1c     Status: Abnormal   Collection Time: 04/20/15  6:33 PM  Result Value Ref Range   Hgb A1c MFr Bld 6.3 (H) 4.8 - 5.6 %    Comment: (NOTE)         Pre-diabetes: 5.7 - 6.4         Diabetes:  >6.4         Glycemic control for adults with diabetes: <7.0    Mean Plasma Glucose 134 mg/dL    Comment: (NOTE) Performed At: [facility] [address] [place], [place] [id number] Lindon Romp MD FB:[phone]   Procalcitonin - Baseline     Status: None   Collection  Time: 04/20/15  6:33 PM  Result Value Ref Range   Procalcitonin 1.50 ng/mL    Comment:        Interpretation: PCT > 0.5 ng/mL and <= 2 ng/mL: Systemic infection (sepsis) is possible, but other conditions are known to elevate PCT as well. (NOTE)         ICU PCT Algorithm               Non ICU PCT Algorithm    ----------------------------     ------------------------------         PCT < 0.25 ng/mL                 PCT < 0.1 ng/mL     Stopping of antibiotics            Stopping of antibiotics       strongly encouraged.               strongly encouraged.    ----------------------------     ------------------------------       PCT level decrease by               PCT < 0.25 ng/mL       >= 80% from peak PCT       OR PCT 0.25 - 0.5 ng/mL          Stopping of antibiotics                                             encouraged.     Stopping of antibiotics           encouraged.    ----------------------------     ------------------------------       PCT level decrease by              PCT >= 0.25 ng/mL       < 80% from peak PCT        AND PCT >= 0.5 ng/mL             Continuing antibiotics                                              encouraged.       Continuing antibiotics            encouraged.    ----------------------------     ------------------------------     PCT level increase compared          PCT > 0.5 ng/mL         with peak PCT AND          PCT >= 0.5 ng/mL             Escalation of antibiotics                                          strongly encouraged.      Escalation of antibiotics        strongly encouraged.   Glucose, capillary     Status: Abnormal   Collection Time: 04/20/15  8:00 PM   Result Value Ref Range   Glucose-Capillary 201 (H) 65 -  99 mg/dL   Comment 1 Notify RN    Comment 2 Document in Chart   Glucose, capillary     Status: Abnormal   Collection Time: 04/21/15 12:07 AM  Result Value Ref Range   Glucose-Capillary 205 (H) 65 - 99 mg/dL   Comment 1 Notify RN    Comment 2 Document in Chart   Glucose, capillary     Status: Abnormal   Collection Time: 04/21/15  4:05 AM  Result Value Ref Range   Glucose-Capillary 191 (H) 65 - 99 mg/dL   Comment 1 Notify RN    Comment 2 Document in Chart   Lipase, blood     Status: Abnormal   Collection Time: 04/21/15  5:25 AM  Result Value Ref Range   Lipase 65 (H) 11 - 51 U/L  Comprehensive metabolic panel     Status: Abnormal   Collection Time: 04/21/15  5:25 AM  Result Value Ref Range   Sodium 140 135 - 145 mmol/L   Potassium 4.3 3.5 - 5.1 mmol/L   Chloride 106 101 - 111 mmol/L   CO2 26 22 - 32 mmol/L   Glucose, Bld 227 (H) 65 - 99 mg/dL   BUN 40 (H) 6 - 20 mg/dL   Creatinine, Ser 1.85 (H) 0.44 - 1.00 mg/dL   Calcium 8.7 (L) 8.9 - 10.3 mg/dL   Total Protein 5.7 (L) 6.5 - 8.1 g/dL   Albumin 2.8 (L) 3.5 - 5.0 g/dL   AST 178 (H) 15 - 41 U/L   ALT 115 (H) 14 - 54 U/L   Alkaline Phosphatase 39 38 - 126 U/L   Total Bilirubin 2.9 (H) 0.3 - 1.2 mg/dL   GFR calc non Af Amer 25 (L) >60 mL/min   GFR calc Af Amer 29 (L) >60 mL/min    Comment: (NOTE) The eGFR has been calculated using the CKD EPI equation. This calculation has not been validated in all clinical situations. eGFR's persistently <60 mL/min signify possible Chronic Kidney Disease.    Anion gap 8 5 - 15  CBC     Status: Abnormal   Collection Time: 04/21/15  5:25 AM  Result Value Ref Range   WBC 6.9 4.0 - 10.5 K/uL   RBC 3.66 (L) 3.87 - 5.11 MIL/uL   Hemoglobin 10.8 (L) 12.0 - 15.0 g/dL   HCT 34.9 (L) 36.0 - 46.0 %   MCV 95.4 78.0 - 100.0 fL   MCH 29.5 26.0 - 34.0 pg   MCHC 30.9 30.0 - 36.0 g/dL   RDW 19.3 (H) 11.5 - 15.5 %   Platelets 154 150 - 400  K/uL  Glucose, capillary     Status: Abnormal   Collection Time: 04/21/15  7:48 AM  Result Value Ref Range   Glucose-Capillary 204 (H) 65 - 99 mg/dL  Glucose, capillary     Status: Abnormal   Collection Time: 04/21/15 12:53 PM  Result Value Ref Range   Glucose-Capillary 211 (H) 65 - 99 mg/dL    Ct Abdomen Pelvis Wo Contrast  04/20/2015  CLINICAL DATA:  Post fall now with worsening low back pain. EXAM: CT ABDOMEN AND PELVIS WITHOUT CONTRAST TECHNIQUE: Multidetector CT imaging of the abdomen and pelvis was performed following the standard protocol without IV contrast. COMPARISON:  Lumbar spine MRI - 01/28/2008 FINDINGS: The lack of intravenous contrast limits the ability to evaluate solid abdominal organs. Normal hepatic contour. Radiopaque gallstones and layering sludge is seen within otherwise normal-appearing gallbladder. No definitive gallbladder wall thickening or pericholecystic  fluid on this noncontrast examination. No ascites. The hepatic flexure of the colon is incidentally noted to be interposed adjacent to the anterior aspect of the right lobe of the liver. The bilateral kidneys appear mildly atrophic with diffuse cortical thinning. Otherwise, normal noncontrast appearance of the bilateral kidneys. There is a minimal amount of grossly symmetric bilateral perinephric stranding presumably age and body habitus related. No renal stones. No renal stones are seen along the course of either ureter or the urinary bladder. Normal noncontrast appearance of the urinary bladder given degree distention. No urinary obstruction. Note is made of an approximately 2.9 x 2.2 cm right adrenal gland nodule which appears morphologically similar to the 12/2007 examination. Normal noncontrast appearance of the left adrenal gland, pancreas and spleen. Ingested enteric contrast extends to the level of the proximal descending colon. Scattered colonic diverticulosis without evidence of diverticulitis. No evidence of  enteric obstruction. No pneumoperitoneum, pneumatosis or portal venous gas. Large amount of calcified atherosclerotic plaque within a normal caliber abdominal aorta. No bulky retroperitoneal, mesenteric, pelvic or inguinal lymphadenopathy. Normal appearance of the pelvic organs for age. No discrete adnexal lesion. No free fluid in the pelvic cul-de-sac. Limited visualization of the lower thorax is degraded secondary to patient respiratory artifact. No discrete focal airspace opacities. No pleural effusion. Borderline cardiomegaly. Exuberant calcifications within the mitral valve annulus. Coronary artery calcifications. No pericardial effusion. No acute or aggressive osseous abnormalities. Moderate to severe multilevel thoracolumbar spine DDD, likely worse at L4-L5 and L5-S1 with disc space height loss, endplate irregularity and sclerosis. Stigmata of DISH within the caudal aspect of the thoracic spine. Small mesenteric fat containing periumbilical hernia. Mild diffuse body wall anasarca, most conspicuous about the midline of the low back. Regional soft tissues appear otherwise normal. IMPRESSION: 1. No acute findings within the abdomen or pelvis. Specifically, no evidence of enteric or urinary obstruction. No acute osseus abnormalities. 2. Moderate severe multilevel thoracolumbar spine DDD, progressed since the 2009 examination. 3. 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 4. Mild atrophy of the bilateral kidneys, nonspecific though could be seen in the setting of medical renal disease. Clinical correlation is advised. 5. Colonic diverticulosis without evidence of diverticulitis. 6. Borderline cardiomegaly with exuberant calcifications within the mitral valve annulus. Coronary artery calcifications. 7. Cholelithiasis without evidence of cholecystitis on this noncontrast  examination. Electronically Signed   By: Sandi Mariscal M.D.   On: 04/20/2015 15:03   US Abdomen Complete  04/20/2015  CLINICAL DATA:  Nausea and vomiting for 1 day, history of acute pancreatitis EXAM: ABDOMEN ULTRASOUND COMPLETE COMPARISON:  04/20/2015 FINDINGS: Gallbladder: Well distended with multiple mobile gallstones. No gallbladder wall thickening or pericholecystic fluid is noted. Common bile duct: Diameter: 9.4 mm. Liver: No focal lesion identified. Within normal limits in parenchymal echogenicity. IVC: No abnormality visualized. Pancreas: Not well seen due to overlying bowel gas. Spleen: Size and appearance within normal limits. Right Kidney: Length: 12.5 cm.  Diffuse cortical thinning is noted. Left Kidney: Length: 12.1 cm.  Diffuse cortical thinning is noted. Abdominal aorta: No aneurysm visualized. Other findings: None. IMPRESSION: Cholelithiasis without complicating factors. Electronically Signed   By: Inez Catalina M.D.   On: 04/20/2015 17:58   Mr Abdomen Mrcp Wo Cm  04/21/2015  CLINICAL DATA:  Abdominal pain.  Nausea. EXAM: MRI ABDOMEN WITHOUT CONTRAST  (INCLUDING MRCP) TECHNIQUE: Multiplanar multisequence MR imaging of the abdomen was performed.  Heavily T2-weighted images of the biliary and pancreatic ducts were obtained, and three-dimensional MRCP images were rendered by post processing. COMPARISON:  04/20/2015 and MR from 01/21/2008 FINDINGS: Lower chest: Small pleural effusions and right lower lobe airspace consolidation noted. Hepatobiliary: There is hepatic steatosis noted. No focal liver abnormality identified. Stones noted within the gallbladder. These measure up to 1 cm. There is mild intrahepatic bile duct dilatation. Within the distal common bile duct there is a stone measuring 9 x 6 mm, image 27 of series 4. Pancreas: Negative. Spleen: The spleen is borderline enlarged measuring 13 cm in length. Adrenals/Urinary Tract: Susceptibility artifact is identified within the right adrenal  nodule compatible with hemorrhage. Stomach/Bowel: The stomach appears normal. No abnormal dilatation of the visualized upper abdominal bowel loops. Vascular/Lymphatic: Normal appearance of the abdominal aorta. No enlarged upper abdominal lymph nodes. Other: No free fluid or fluid collections identified. Musculoskeletal: Normal signal from within the bone marrow. IMPRESSION: 1. Gallstones. The common bile duct is increased in caliber and there is a stone within the distal CBD. 2. Hepatic steatosis 3. Right adrenal nodule. Susceptibility artifact noted suggesting previous hemorrhage. This does not appear significantly changed from 01/21/2008 compatible with a benign process. Electronically Signed   By: Kerby Moors M.D.   On: 04/21/2015 14:54   Dg Chest Port 1 View  04/20/2015  CLINICAL DATA:  Fever and nausea EXAM: PORTABLE CHEST 1 VIEW COMPARISON:  PA and lateral chest x-ray of January 28, 2008 FINDINGS: The lungs are adequately inflated. There are coarse lung markings in the left retrocardiac region. There is no pleural effusion. The cardiac silhouette is enlarged. The pulmonary vascularity is not engorged. There is increased soft tissue density in the left suprahilar region lying lateral to the aortic arch. The trachea is midline. The bony thorax exhibits no acute abnormality. IMPRESSION: Stable enlargement cardiac silhouette without significant pulmonary vascular congestion. Increased prominence of the soft tissues in the left hilar and suprahilar regions may be in part due to projection. However, atelectasis or infiltrate in the right upper lobe is not excluded. If the patient can tolerate the procedure, a PA and lateral chest x-ray would be useful. Electronically Signed   By: David  Martinique M.D.   On: 04/20/2015 09:33    Review of Systems  Constitutional: Negative.   HENT: Negative.   Eyes: Negative.   Respiratory: Positive for shortness of breath (DOE) and wheezing. Negative for cough, hemoptysis  and sputum production.        She snores, doesn't notice sleep apnea Uses inhaler to shower and prn  Cardiovascular: Positive for leg swelling (chronic issue at home, better now with feet up.). Negative for PND.  Gastrointestinal: Positive for nausea, vomiting and abdominal pain. Negative for heartburn, diarrhea, constipation, blood in stool and melena.  Genitourinary: Negative.   Musculoskeletal: Positive for back pain (Has DDD limited mobility, with walker and wheelchair).  Skin: Negative.   Neurological: Negative.   Endo/Heme/Allergies: Bruises/bleeds easily.  Psychiatric/Behavioral: Negative.    Blood pressure 177/56, pulse 67, temperature 98.3 F (36.8 C), temperature source Oral, resp. rate 18, height _0  (1.626 m), weight 104.463 kg (230 lb 4.8 oz), SpO2 96 %. Physical Exam  Constitutional: She is oriented to person, place, and time. No distress.  Obese, Deconditioned  WF on O2 at rest in bed.  Does not use O2 at home.   HENT:  Head: Normocephalic.  Nose: Nose normal.  Eyes: Conjunctivae and EOM are normal. Right eye exhibits discharge. Left eye exhibits  no discharge. No scleral icterus.  Neck: Neck supple. No JVD present. No tracheal deviation present. No thyromegaly present.  Cardiovascular: Normal rate, regular rhythm, normal heart sounds and intact distal pulses.   No murmur heard. Respiratory: Effort normal and breath sounds normal. No respiratory distress. She has no wheezes. She has no rales. She exhibits no tenderness.  BS down in base some  GI: Soft. Bowel sounds are normal. She exhibits no distension and no mass. There is no tenderness. There is no rebound and no guarding.  She never had much pain, unless you press on abdomen and that is mid epigastric  Musculoskeletal: She exhibits no edema.  Lymphadenopathy:    She has no cervical adenopathy.  Neurological: She is alert and oriented to person, place, and time. No cranial nerve deficit.  She has peripheral  neuropathy, ambulation restricted to wheelchair and walker  Skin: Skin is warm and dry. No rash noted. She is not diaphoretic. No erythema. No pallor.  Psychiatric: She has a normal mood and affect. Her behavior is normal. Judgment and thought content normal.    Assessment/Plan: Gallstone pancreatitis COPD OSA AODM ID Peripheral neuropathy with limited mobility (wheelchair and walker in house) Deconditioning  Body mass index is 39.5 DDD Chronic kidney disease Hx of depression/anxiety Dyslipidemia  Plan:  She is for ERCP tomorrow.  She is a very poor surgical risk, she needs medical clearance and cardiac clearance for anesthesia.  It sounds like she has chronic lower leg swelling at home.  Echo report today shows EF 55-60%.  Grade 2 diastolic function.  Severe mitral annulus calcification, mild MR.  We will see tomorrow after ERCP and if she is cleared make plans for cholecystectomy on Friday.  Elfa Wooton 04/21/2015, 3:28 PM

## 2015-04-21 NOTE — Progress Notes (Signed)
Noted that patient admitted with pancreatitis and nausea & vomiting. Takes 75/25 insulin 55 units every am and 65 units every pm at home. Also takes Amaryl.  Current meds: Amaryl, Novolog MODERATE correction scale TID & HS.  May need to add basal insulin while in the hospital and when able to eat. Could start either the mixed dosages or Lantus  50-60 units daily that would be equal to a little less than the dosage of daily 120 units of 75/25 insulin. Will continue to follow while in hospital. Harvel Ricks RN BSN CDE

## 2015-04-21 NOTE — Progress Notes (Signed)
RT came to give inhaler to patient and patients O2 sat was 86. RT placed patient on 2.5L [place]. RT will continue to monitor.

## 2015-04-21 NOTE — Progress Notes (Signed)
Echocardiogram 2D Echocardiogram has been performed.  Anna [name] 04/21/2015, 3:41 PM

## 2015-04-21 NOTE — Progress Notes (Signed)
Daily Rounding Note  04/21/2015, 8:33 AM  LOS: 1 day   SUBJECTIVE:       No abdominal pain.  Back pain better.  No further nausea. Urinating well. For some reason, the clear tray was taken away from her last night.  Slept fairly well.  Feels better  OBJECTIVE:         Vital signs in last 24 hours:    Temp:  [98.8 F (37.1 C)-99.6 F (37.6 C)] 98.8 F (37.1 C) (12/21 0540) Pulse Rate:  [71-86] 76 (12/21 0553) Resp:  [16-26] 18 (12/21 0540) BP: (137-185)/(48-83) 162/66 mmHg (12/21 0628) SpO2:  [86 %-96 %] 86 % (12/21 0819) Weight:  [104.282 kg (229 lb 14.4 oz)-104.463 kg (230 lb 4.8 oz)] 104.463 kg (230 lb 4.8 oz) (12/21 0430) Last BM Date: 04/20/15 Filed Weights   04/20/15 0822 04/20/15 2103 04/21/15 0430  Weight: 105.235 kg (232 lb) 104.282 kg (229 lb 14.4 oz) 104.463 kg (230 lb 4.8 oz)   General: looks better but still looks chronically ill.     Heart: RRR Chest: clear bil.   Abdomen: soft, NT, ND.  Obese.  Active BS  Extremities: no CCE Neuro/Psych:  Pleasant, calm, cooperative and engaged, moves all 4 limbs.  Oriented x 3.    Intake/Output from previous day: 12/20 0701 - 12/21 0700 In: 1665 [I.V.:1665] Out: 1000 [Urine:1000]  Intake/Output this shift: Total I/O In: 0  Out: 500 [Urine:500]  Lab Results:  Recent Labs  04/20/15 0838 04/21/15 0525  WBC 9.4 6.9  HGB 11.2* 10.8*  HCT 37.7 34.9*  PLT 182 154   BMET  Recent Labs  04/20/15 0838 04/21/15 0525  NA 140 140  K 5.0 4.3  CL 102 106  CO2 25 26  GLUCOSE 264* 227*  BUN 57* 40*  CREATININE 2.25* 1.85*  CALCIUM 9.5 8.7*   LFT  Recent Labs  04/20/15 0838 04/21/15 0525  PROT 6.3* 5.7*  ALBUMIN 3.4* 2.8*  AST 302* 178*  ALT 118* 115*  ALKPHOS 43 39  BILITOT 2.1* 2.9*   PT/INR No results for input(s): LABPROT, INR in the last 72 hours. Hepatitis Panel No results for input(s): HEPBSAG, HCVAB, HEPAIGM, HEPBIGM in the last 72  hours.  Studies/Results: Ct Abdomen Pelvis Wo Contrast  04/20/2015  CLINICAL DATA:  Post fall now with worsening low back pain. EXAM: CT ABDOMEN AND PELVIS WITHOUT CONTRAST TECHNIQUE: Multidetector CT imaging of the abdomen and pelvis was performed following the standard protocol without IV contrast. COMPARISON:  Lumbar spine MRI - 01/28/2008 FINDINGS: The lack of intravenous contrast limits the ability to evaluate solid abdominal organs. Normal hepatic contour. Radiopaque gallstones and layering sludge is seen within otherwise normal-appearing gallbladder. No definitive gallbladder wall thickening or pericholecystic fluid on this noncontrast examination. No ascites. The hepatic flexure of the colon is incidentally noted to be interposed adjacent to the anterior aspect of the right lobe of the liver. The bilateral kidneys appear mildly atrophic with diffuse cortical thinning. Otherwise, normal noncontrast appearance of the bilateral kidneys. There is a minimal amount of grossly symmetric bilateral perinephric stranding presumably age and body habitus related. No renal stones. No renal stones are seen along the course of either ureter or the urinary bladder. Normal noncontrast appearance of the urinary bladder given degree distention. No urinary obstruction. Note is made of an approximately 2.9 x 2.2 cm right adrenal gland nodule which appears morphologically similar to the 12/2007 examination. Normal noncontrast appearance  of the left adrenal gland, pancreas and spleen. Ingested enteric contrast extends to the level of the proximal descending colon. Scattered colonic diverticulosis without evidence of diverticulitis. No evidence of enteric obstruction. No pneumoperitoneum, pneumatosis or portal venous gas. Large amount of calcified atherosclerotic plaque within a normal caliber abdominal aorta. No bulky retroperitoneal, mesenteric, pelvic or inguinal lymphadenopathy. Normal appearance of the pelvic organs for  age. No discrete adnexal lesion. No free fluid in the pelvic cul-de-sac. Limited visualization of the lower thorax is degraded secondary to patient respiratory artifact. No discrete focal airspace opacities. No pleural effusion. Borderline cardiomegaly. Exuberant calcifications within the mitral valve annulus. Coronary artery calcifications. No pericardial effusion. No acute or aggressive osseous abnormalities. Moderate to severe multilevel thoracolumbar spine DDD, likely worse at L4-L5 and L5-S1 with disc space height loss, endplate irregularity and sclerosis. Stigmata of DISH within the caudal aspect of the thoracic spine. Small mesenteric fat containing periumbilical hernia. Mild diffuse body wall anasarca, most conspicuous about the midline of the low back. Regional soft tissues appear otherwise normal. IMPRESSION: 1. No acute findings within the abdomen or pelvis. Specifically, no evidence of enteric or urinary obstruction. No acute osseus abnormalities. 2. Moderate severe multilevel thoracolumbar spine DDD, progressed since the 2009 examination. 3. 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 4. Mild atrophy of the bilateral kidneys, nonspecific though could be seen in the setting of medical renal disease. Clinical correlation is advised. 5. Colonic diverticulosis without evidence of diverticulitis. 6. Borderline cardiomegaly with exuberant calcifications within the mitral valve annulus. Coronary artery calcifications. 7. Cholelithiasis without evidence of cholecystitis on this noncontrast examination. Electronically Signed   By: Sandi Mariscal M.D.   On: 04/20/2015 15:03   US Abdomen Complete  04/20/2015  CLINICAL DATA:  Nausea and vomiting for 1 day, history of acute pancreatitis EXAM: ABDOMEN ULTRASOUND COMPLETE COMPARISON:  04/20/2015 FINDINGS: Gallbladder: Well  distended with multiple mobile gallstones. No gallbladder wall thickening or pericholecystic fluid is noted. Common bile duct: Diameter: 9.4 mm. Liver: No focal lesion identified. Within normal limits in parenchymal echogenicity. IVC: No abnormality visualized. Pancreas: Not well seen due to overlying bowel gas. Spleen: Size and appearance within normal limits. Right Kidney: Length: 12.5 cm.  Diffuse cortical thinning is noted. Left Kidney: Length: 12.1 cm.  Diffuse cortical thinning is noted. Abdominal aorta: No aneurysm visualized. Other findings: None. IMPRESSION: Cholelithiasis without complicating factors. Electronically Signed   By: Inez Catalina M.D.   On: 04/20/2015 17:58   Dg Chest Port 1 View  04/20/2015  CLINICAL DATA:  Fever and nausea EXAM: PORTABLE CHEST 1 VIEW COMPARISON:  PA and lateral chest x-ray of January 28, 2008 FINDINGS: The lungs are adequately inflated. There are coarse lung markings in the left retrocardiac region. There is no pleural effusion. The cardiac silhouette is enlarged. The pulmonary vascularity is not engorged. There is increased soft tissue density in the left suprahilar region lying lateral to the aortic arch. The trachea is midline. The bony thorax exhibits no acute abnormality. IMPRESSION: Stable enlargement cardiac silhouette without significant pulmonary vascular congestion. Increased prominence of the soft tissues in the left hilar and suprahilar regions may be in part due to projection. However, atelectasis or infiltrate in the right upper lobe is not excluded. If the patient can tolerate the procedure, a PA and lateral chest x-ray would be useful. Electronically Signed   By: Shanon Brow  [place] M.D.   On: 04/20/2015 09:33   Scheduled Meds: . amLODipine  5 mg Oral Daily  . budesonide-formoterol  1 puff Inhalation BID  . carvedilol  12.5 mg Oral BID WC  . citalopram  20 mg Oral Daily  . cloNIDine  0.2 mg Oral BID  . enoxaparin (LOVENOX) injection  30 mg Subcutaneous  Q24H  . gabapentin  300 mg Oral QID  . glimepiride  2 mg Oral Q breakfast  . insulin aspart  0-15 Units Subcutaneous 6 times per day  . pantoprazole  40 mg Oral Daily  . sodium chloride  3 mL Intravenous Q12H   Continuous Infusions: . sodium chloride 150 mL/hr at 04-25-2015 0425   PRN Meds:.acetaminophen **OR** acetaminophen, morphine injection, ondansetron (ZOFRAN) IV  ASSESMENT:   * Acute pancreatitis. Presume biliary: gallstones on CT and ultrasound, 9.4 mm CBD but no choledocholithiasis, no visible pancreatitis.  Lipase and transaminases improved.  T bili slightly increased. Alk phos consistently normal.   * Iron deficiency anemia. Suspect anemia of chronic disease related to multiple comorbidities and advanced chronic kidney disease.Hgb declined in setting of rehydration with IVF.  Adenomas on colonoscopy, normal EGD in 09/2014.   * CKD.  AKI improved.   * OSA, not on CPAP.  * Degenerative spine disease.  Low back pain radiating to the legs.    PLAN   *   ? MRCP vs ERCP.  Pt high risk for sedation.  Will d/w MD.  Keep  NPO til decisions re MR studies made.     Azucena Freed  04-25-15, 8:33 AM Pager: [phone]   Attending physician's note   I have taken a history, examined the patient and reviewed the chart. I agree with the Advanced Practitioner's note, impression and recommendations. Will plan for MRCP and if no evidence of choledocholithiasis or biliary obstruction. Consult surgery for possible cholecystectomy   K Denzil Magnuson, MD [phone] Mon-Fri 8a-5p [phone] after 5p, weekends, holidays

## 2015-04-21 NOTE — Progress Notes (Signed)
On MRCP. pt has 1 cm CBD stone distally. Pt set up for ERCP at 1300 on 12/22. Risks, benefits and technicalities of procedure d/w pt and her family.   She is agreeable. Family asking questions about cholecystectomy as well.  I said it might be that surgeons feel this is too risky.  However, Dr Sloan Leiter will ask surgery to see pt. Allowing pt carb mod diet tonite.  preop abx ordered.  Discontinued the Lovenox, ordered placement of PAS stockings.  2 D echo in progress, ordered at admission yesterday.   Annye English  [id number].

## 2015-04-21 NOTE — Progress Notes (Addendum)
Tylene Fantasia NP paged and notified about patient's BP 175/55 and pulse rate 76. Pedro Earls  04/21/2015  6:07 AM  K. Baltazar Najjar placed care instructions to check pt's BP manually and call back if SBP > 170. Will carry out order.  Pedro Earls  04/21/2015  6:12 AM

## 2015-04-21 NOTE — Progress Notes (Signed)
PATIENT DETAILS Name: Anna [name] Age: 80 y.o. Sex: female Date of Birth: 1934/11/01 Admit Date: 04/20/2015 Admitting Physician Waldemar Dickens, MD MD:[id number] MARGARET, FNP  Subjective: Abdominal pain has significantly improved. No further vomiting.  Assessment/Plan: Principal Problem: Acute biliary pancreatitis: Improved, abdomen is soft and nontender, start clear liquids. GI consulted, plans of MRCP today. If MRCP does not show choledocholithiasis, will consult general surgery.  Active Problems: Acute on chronic kidney disease stage IV: ARF secondary to previous azotemia due to pancreatitis. Creatinine back to usual baseline with IV fluids. Follow.   Insulin-dependent type 2 diabetes: CBGs currently stable with SSI and glimepiride, once diet is stable, we will resume usual insulin 75/25 regimen  Hypertension: Not optimally controlled-continue amlodipine, clonidine, Coreg at current doses-suspect pain on admission was playing a role-we will reassess tomorrow and adjust doses of medications accordingly.  Dyslipidemia: Resume antilipid treatment once LFTs have normalized.  GERD: Continue PPI  Peripheral neuropathy: Continue Neurontin   Anemia: Likely multifactorial from CK D and iron deficiency anemia (per PCP note has history of iron deficiency) hemoglobin currently stable and close to usual baseline   COPD: lungs clear on exam-continue Symbicort, add as needed albuterol.  Depression with anxiety: Appears stable, continue with Celexa  Disposition: Remain inpatient  Antimicrobial agents  See below  Anti-infectives    None      DVT Prophylaxis: Prophylactic Lovenox   Code Status: Full code   Family Communication Multiple family members at bedside  Procedures: None  CONSULTS:  GI  Time spent 30 minutes-Greater than 50% of this time was spent in counseling, explanation of diagnosis, planning of further management, and coordination  of care.  MEDICATIONS: Scheduled Meds: . amLODipine  5 mg Oral Daily  . budesonide-formoterol  1 puff Inhalation BID  . carvedilol  12.5 mg Oral BID WC  . citalopram  20 mg Oral Daily  . cloNIDine  0.2 mg Oral BID  . enoxaparin (LOVENOX) injection  50 mg Subcutaneous Q24H  . gabapentin  300 mg Oral QID  . glimepiride  2 mg Oral Q breakfast  . insulin aspart  0-15 Units Subcutaneous 6 times per day  . pantoprazole  40 mg Oral Daily  . sodium chloride  3 mL Intravenous Q12H   Continuous Infusions: . sodium chloride 125 mL/hr at 04/21/15 1307   PRN Meds:.acetaminophen **OR** acetaminophen, morphine injection, ondansetron (ZOFRAN) IV    PHYSICAL EXAM: Vital signs in last 24 hours: Filed Vitals:   04/21/15 0628 04/21/15 0819 04/21/15 1258 04/21/15 1339  BP: 162/66  177/56   Pulse:    67  Temp:    98.3 F (36.8 C)  TempSrc:    Oral  Resp:    18  Height:      Weight:      SpO2:  86%  96%    Weight change:  Filed Weights   04/20/15 0822 04/20/15 2103 04/21/15 0430  Weight: 105.235 kg (232 lb) 104.282 kg (229 lb 14.4 oz) 104.463 kg (230 lb 4.8 oz)   Body mass index is 39.51 kg/(m^2).   Gen Exam: Awake and alert with clear speech.   Neck: Supple, No JVD.   Chest: B/L Clear.   CVS: S1 S2 Regular, no murmurs.  Abdomen: soft, BS +, non tender, non distended.  Extremities: no edema, lower extremities warm to touch. Neurologic: Non Focal.   Skin: No Rash.   Wounds: N/A.  Intake/Output from previous day:  Intake/Output Summary (Last 24 hours) at 04/21/15 1353 Last data filed at 04/21/15 [id number]  Gross per 24 hour  Intake   1665 ml  Output   1500 ml  Net    165 ml     LAB RESULTS: CBC  Recent Labs Lab 04/15/15 1246 04/20/15 0838 04/21/15 0525  WBC  --  9.4 6.9  HGB 9.9* 11.2* 10.8*  HCT  --  37.7 34.9*  PLT  --  182 154  MCV  --  95.2 95.4  MCH  --  28.3 29.5  MCHC  --  29.7* 30.9  RDW  --  19.3* 19.3*    Chemistries   Recent Labs Lab 04/20/15 0838  04/21/15 0525  NA 140 140  K 5.0 4.3  CL 102 106  CO2 25 26  GLUCOSE 264* 227*  BUN 57* 40*  CREATININE 2.25* 1.85*  CALCIUM 9.5 8.7*    CBG:  Recent Labs Lab 04/20/15 2000 04/21/15 0007 04/21/15 0405 04/21/15 0748 04/21/15 1253  GLUCAP 201* 205* 191* 204* 211*    GFR Estimated Creatinine Clearance: 29.5 mL/min (by C-G formula based on Cr of 1.85).  Coagulation profile No results for input(s): INR, PROTIME in the last 168 hours.  Cardiac Enzymes No results for input(s): CKMB, TROPONINI, MYOGLOBIN in the last 168 hours.  Invalid input(s): CK  Invalid input(s): POCBNP No results for input(s): DDIMER in the last 72 hours.  Recent Labs  04/20/15 1833  HGBA1C 6.3*   No results for input(s): CHOL, HDL, LDLCALC, TRIG, CHOLHDL, LDLDIRECT in the last 72 hours. No results for input(s): TSH, T4TOTAL, T3FREE, THYROIDAB in the last 72 hours.  Invalid input(s): FREET3 No results for input(s): VITAMINB12, FOLATE, FERRITIN, TIBC, IRON, RETICCTPCT in the last 72 hours.  Recent Labs  04/20/15 0838 04/21/15 0525  LIPASE 876* 65*    Urine Studies No results for input(s): UHGB, CRYS in the last 72 hours.  Invalid input(s): UACOL, UAPR, USPG, UPH, UTP, UGL, UKET, UBIL, UNIT, UROB, ULEU, UEPI, UWBC, URBC, UBAC, CAST, UCOM, BILUA  MICROBIOLOGY: Recent Results (from the past 240 hour(s))  Blood Culture (routine x 2)     Status: None (Preliminary result)   Collection Time: 04/20/15  8:50 AM  Result Value Ref Range Status   Specimen Description BLOOD RIGHT ANTECUBITAL  Final   Special Requests BOTTLES DRAWN AEROBIC AND ANAEROBIC 10MLS  Final   Culture NO GROWTH 1 DAY  Final   Report Status PENDING  Incomplete  Blood Culture (routine x 2)     Status: None (Preliminary result)   Collection Time: 04/20/15  9:05 AM  Result Value Ref Range Status   Specimen Description BLOOD RIGHT ANTECUBITAL  Final   Special Requests BOTTLES DRAWN AEROBIC AND ANAEROBIC 10MLS  Final    Culture NO GROWTH 1 DAY  Final   Report Status PENDING  Incomplete    RADIOLOGY STUDIES/RESULTS: Ct Abdomen Pelvis Wo Contrast  04/20/2015  CLINICAL DATA:  Post fall now with worsening low back pain. EXAM: CT ABDOMEN AND PELVIS WITHOUT CONTRAST TECHNIQUE: Multidetector CT imaging of the abdomen and pelvis was performed following the standard protocol without IV contrast. COMPARISON:  Lumbar spine MRI - 01/28/2008 FINDINGS: The lack of intravenous contrast limits the ability to evaluate solid abdominal organs. Normal hepatic contour. Radiopaque gallstones and layering sludge is seen within otherwise normal-appearing gallbladder. No definitive gallbladder wall thickening or pericholecystic fluid on this noncontrast examination. No ascites. The hepatic flexure of the colon  is incidentally noted to be interposed adjacent to the anterior aspect of the right lobe of the liver. The bilateral kidneys appear mildly atrophic with diffuse cortical thinning. Otherwise, normal noncontrast appearance of the bilateral kidneys. There is a minimal amount of grossly symmetric bilateral perinephric stranding presumably age and body habitus related. No renal stones. No renal stones are seen along the course of either ureter or the urinary bladder. Normal noncontrast appearance of the urinary bladder given degree distention. No urinary obstruction. Note is made of an approximately 2.9 x 2.2 cm right adrenal gland nodule which appears morphologically similar to the 12/2007 examination. Normal noncontrast appearance of the left adrenal gland, pancreas and spleen. Ingested enteric contrast extends to the level of the proximal descending colon. Scattered colonic diverticulosis without evidence of diverticulitis. No evidence of enteric obstruction. No pneumoperitoneum, pneumatosis or portal venous gas. Large amount of calcified atherosclerotic plaque within a normal caliber abdominal aorta. No bulky retroperitoneal, mesenteric, pelvic  or inguinal lymphadenopathy. Normal appearance of the pelvic organs for age. No discrete adnexal lesion. No free fluid in the pelvic cul-de-sac. Limited visualization of the lower thorax is degraded secondary to patient respiratory artifact. No discrete focal airspace opacities. No pleural effusion. Borderline cardiomegaly. Exuberant calcifications within the mitral valve annulus. Coronary artery calcifications. No pericardial effusion. No acute or aggressive osseous abnormalities. Moderate to severe multilevel thoracolumbar spine DDD, likely worse at L4-L5 and L5-S1 with disc space height loss, endplate irregularity and sclerosis. Stigmata of DISH within the caudal aspect of the thoracic spine. Small mesenteric fat containing periumbilical hernia. Mild diffuse body wall anasarca, most conspicuous about the midline of the low back. Regional soft tissues appear otherwise normal. IMPRESSION: 1. No acute findings within the abdomen or pelvis. Specifically, no evidence of enteric or urinary obstruction. No acute osseus abnormalities. 2. Moderate severe multilevel thoracolumbar spine DDD, progressed since the 2009 examination. 3. 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 4. Mild atrophy of the bilateral kidneys, nonspecific though could be seen in the setting of medical renal disease. Clinical correlation is advised. 5. Colonic diverticulosis without evidence of diverticulitis. 6. Borderline cardiomegaly with exuberant calcifications within the mitral valve annulus. Coronary artery calcifications. 7. Cholelithiasis without evidence of cholecystitis on this noncontrast examination. Electronically Signed   By: Sandi Mariscal M.D.   On: 04/20/2015 15:03   US Abdomen Complete  04/20/2015  CLINICAL DATA:  Nausea and vomiting for 1 day, history of acute pancreatitis EXAM: ABDOMEN  ULTRASOUND COMPLETE COMPARISON:  04/20/2015 FINDINGS: Gallbladder: Well distended with multiple mobile gallstones. No gallbladder wall thickening or pericholecystic fluid is noted. Common bile duct: Diameter: 9.4 mm. Liver: No focal lesion identified. Within normal limits in parenchymal echogenicity. IVC: No abnormality visualized. Pancreas: Not well seen due to overlying bowel gas. Spleen: Size and appearance within normal limits. Right Kidney: Length: 12.5 cm.  Diffuse cortical thinning is noted. Left Kidney: Length: 12.1 cm.  Diffuse cortical thinning is noted. Abdominal aorta: No aneurysm visualized. Other findings: None. IMPRESSION: Cholelithiasis without complicating factors. Electronically Signed   By: Inez Catalina M.D.   On: 04/20/2015 17:58   Dg Chest Port 1 View  04/20/2015  CLINICAL DATA:  Fever and nausea EXAM: PORTABLE CHEST 1 VIEW COMPARISON:  PA and lateral chest x-ray of January 28, 2008 FINDINGS: The lungs are adequately inflated. There are coarse lung markings in the left retrocardiac region.  There is no pleural effusion. The cardiac silhouette is enlarged. The pulmonary vascularity is not engorged. There is increased soft tissue density in the left suprahilar region lying lateral to the aortic arch. The trachea is midline. The bony thorax exhibits no acute abnormality. IMPRESSION: Stable enlargement cardiac silhouette without significant pulmonary vascular congestion. Increased prominence of the soft tissues in the left hilar and suprahilar regions may be in part due to projection. However, atelectasis or infiltrate in the right upper lobe is not excluded. If the patient can tolerate the procedure, a PA and lateral chest x-ray would be useful. Electronically Signed   By: David  Martinique M.D.   On: 04/20/2015 09:33    Oren Binet, MD  [facility] Pager:[phone]  If 7PM-7AM, please contact night-coverage [email or web address] Password [email or web address] 04/21/2015, 1:53 PM   LOS: 1 day

## 2015-04-22 ENCOUNTER — Inpatient Hospital Stay (HOSPITAL_COMMUNITY): Payer: Medicare Other | Admitting: Certified Registered Nurse Anesthetist

## 2015-04-22 ENCOUNTER — Inpatient Hospital Stay (HOSPITAL_COMMUNITY): Payer: Medicare Other

## 2015-04-22 ENCOUNTER — Encounter (HOSPITAL_COMMUNITY)
Admission: EM | Disposition: A | Discharge status: Home Health | Payer: Self-pay | Source: Home / Self Care | Attending: Internal Medicine

## 2015-04-22 ENCOUNTER — Encounter (HOSPITAL_COMMUNITY): Payer: Self-pay | Admitting: Anesthesiology

## 2015-04-22 DIAGNOSIS — I10 Essential (primary) hypertension: Secondary | ICD-10-CM

## 2015-04-22 HISTORY — PX: ERCP: SHX5425

## 2015-04-22 LAB — CBC
HEMATOCRIT: 34.1 % — AB (ref 36.0–46.0)
Hemoglobin: 10.4 g/dL — ABNORMAL LOW (ref 12.0–15.0)
MCH: 29.5 pg (ref 26.0–34.0)
MCHC: 30.5 g/dL (ref 30.0–36.0)
MCV: 96.6 fL (ref 78.0–100.0)
PLATELETS: 157 10*3/uL (ref 150–400)
RBC: 3.53 MIL/uL — ABNORMAL LOW (ref 3.87–5.11)
RDW: 19.2 % — AB (ref 11.5–15.5)
WBC: 7.9 10*3/uL (ref 4.0–10.5)

## 2015-04-22 LAB — GLUCOSE, CAPILLARY
GLUCOSE-CAPILLARY: 182 mg/dL — AB (ref 65–99)
GLUCOSE-CAPILLARY: 194 mg/dL — AB (ref 65–99)
GLUCOSE-CAPILLARY: 209 mg/dL — AB (ref 65–99)
GLUCOSE-CAPILLARY: 213 mg/dL — AB (ref 65–99)
GLUCOSE-CAPILLARY: 216 mg/dL — AB (ref 65–99)
Glucose-Capillary: 229 mg/dL — ABNORMAL HIGH (ref 65–99)

## 2015-04-22 LAB — COMPREHENSIVE METABOLIC PANEL
ALBUMIN: 2.6 g/dL — AB (ref 3.5–5.0)
ALT: 91 U/L — ABNORMAL HIGH (ref 14–54)
ANION GAP: 10 (ref 5–15)
AST: 96 U/L — AB (ref 15–41)
Alkaline Phosphatase: 41 U/L (ref 38–126)
BILIRUBIN TOTAL: 1.2 mg/dL (ref 0.3–1.2)
BUN: 30 mg/dL — AB (ref 6–20)
CHLORIDE: 111 mmol/L (ref 101–111)
CO2: 23 mmol/L (ref 22–32)
Calcium: 8.6 mg/dL — ABNORMAL LOW (ref 8.9–10.3)
Creatinine, Ser: 1.54 mg/dL — ABNORMAL HIGH (ref 0.44–1.00)
GFR calc Af Amer: 36 mL/min — ABNORMAL LOW (ref >60)
GFR calc non Af Amer: 31 mL/min — ABNORMAL LOW (ref >60)
GLUCOSE: 231 mg/dL — AB (ref 65–99)
POTASSIUM: 4.5 mmol/L (ref 3.5–5.1)
SODIUM: 144 mmol/L (ref 135–145)
TOTAL PROTEIN: 5.6 g/dL — AB (ref 6.5–8.1)

## 2015-04-22 LAB — PROCALCITONIN: PROCALCITONIN: 0.6 ng/mL

## 2015-04-22 SURGERY — ERCP, WITH INTERVENTION IF INDICATED
Anesthesia: General

## 2015-04-22 MED ORDER — OXYCODONE HCL 5 MG/5ML PO SOLN: 5.0000 mg | Freq: Once | ORAL | Status: DC | PRN

## 2015-04-22 MED ORDER — ONDANSETRON HCL 4 MG/2ML IJ SOLN: 4.0000 mg | Freq: Four times a day (QID) | INTRAMUSCULAR | Status: DC | PRN

## 2015-04-22 MED ORDER — LIDOCAINE HCL (CARDIAC) 20 MG/ML IV SOLN
INTRAVENOUS | Status: DC | PRN
  Administered 2015-04-22: 50 mg via INTRAVENOUS

## 2015-04-22 MED ORDER — FENTANYL CITRATE (PF) 100 MCG/2ML IJ SOLN: 25.0000 ug | INTRAMUSCULAR | Status: DC | PRN

## 2015-04-22 MED ORDER — OXYCODONE HCL 5 MG PO TABS: 5.0000 mg | ORAL_TABLET | Freq: Once | ORAL | Status: DC | PRN

## 2015-04-22 MED ORDER — SODIUM CHLORIDE 0.9 % IV SOLN
INTRAVENOUS | Status: DC
Start: 2015-04-22 — End: 2015-04-22

## 2015-04-22 MED ORDER — SUGAMMADEX SODIUM 200 MG/2ML IV SOLN
INTRAVENOUS | Status: DC | PRN
  Administered 2015-04-22: 200 mg via INTRAVENOUS

## 2015-04-22 MED ORDER — ROCURONIUM BROMIDE 100 MG/10ML IV SOLN
INTRAVENOUS | Status: DC | PRN
  Administered 2015-04-22: 30 mg via INTRAVENOUS

## 2015-04-22 MED ORDER — PROPOFOL 10 MG/ML IV BOLUS
INTRAVENOUS | Status: DC | PRN
  Administered 2015-04-22: 100 mg via INTRAVENOUS

## 2015-04-22 MED ORDER — LACTATED RINGERS IV SOLN
INTRAVENOUS | Status: DC | PRN
  Administered 2015-04-22: 15:00:00 via INTRAVENOUS

## 2015-04-22 MED ORDER — INDOMETHACIN 50 MG RE SUPP
100.0000 mg | Freq: Once | RECTAL | Status: AC
  Administered 2015-04-22: 100 mg via RECTAL

## 2015-04-22 MED ORDER — HYDRALAZINE HCL 20 MG/ML IJ SOLN
INTRAMUSCULAR | Status: AC
  Filled 2015-04-22: qty 1

## 2015-04-22 MED ORDER — SODIUM CHLORIDE 0.9 % IV SOLN
INTRAVENOUS | Status: DC | PRN
  Administered 2015-04-22: 25 mL

## 2015-04-22 MED ORDER — FENTANYL CITRATE (PF) 100 MCG/2ML IJ SOLN
INTRAMUSCULAR | Status: DC | PRN
  Administered 2015-04-22: 100 ug via INTRAVENOUS

## 2015-04-22 NOTE — Transfer of Care (Signed)
Immediate Anesthesia Transfer of Care Note  Patient: Anna [name]  Procedure(s) Performed: Procedure(s): ENDOSCOPIC RETROGRADE CHOLANGIOPANCREATOGRAPHY (ERCP) (N/A)  Patient Location: PACU and Endoscopy Unit  Anesthesia Type:General  Level of Consciousness: awake, alert , oriented and patient cooperative  Airway & Oxygen Therapy: Patient Spontanous Breathing, Patient connected to nasal cannula oxygen and Patient connected to face mask oxygen  Post-op Assessment: Report given to RN, Post -op Vital signs reviewed and stable and Patient moving all extremities  Post vital signs: Reviewed and stable  Last Vitals:  Filed Vitals:   04/22/15 1509 04/22/15 1647  BP: 200/54 201/56  Pulse: 71   Temp:  36.8 C  Resp: 26 17    Complications: No apparent anesthesia complications

## 2015-04-22 NOTE — Progress Notes (Signed)
Subjective: Large family presence.  They are worried about her breathing, some wheezing.  No significant abdominal pain.  For ERCP today.  Objective: Vital signs in last 24 hours: Temp:  [98 F (36.7 C)-98.8 F (37.1 C)] 98 F (36.7 C) (12/22 0606) Pulse Rate:  [67-80] 74 (12/22 1023) Resp:  [18] 18 (12/22 1023) BP: (150-192)/(48-61) 172/61 mmHg (12/22 0606) SpO2:  [89 %-96 %] 92 % (12/22 1023) Weight:  [104.736 kg (230 lb 14.4 oz)] 104.736 kg (230 lb 14.4 oz) (12/22 0606) Last BM Date: 04/20/15 NPO good urine output Afebrile, VSS NO labs MRI 04/21/15:  Gallstones. The common bile duct is increased in caliber and there is a stone within the distal CBD.  Intake/Output from previous day: 12/21 0701 - 12/22 0700 In: 0  Out: 1500 [Urine:1500] Intake/Output this shift:    General appearance: alert, cooperative and no distress GI: soft, a little sore, no acute pain, + BS, not tender or distended.  Lab Results:   Recent Labs  04/20/15 0838 04/21/15 0525  WBC 9.4 6.9  HGB 11.2* 10.8*  HCT 37.7 34.9*  PLT 182 154    BMET  Recent Labs  04/20/15 0838 04/21/15 0525  NA 140 140  K 5.0 4.3  CL 102 106  CO2 25 26  GLUCOSE 264* 227*  BUN 57* 40*  CREATININE 2.25* 1.85*  CALCIUM 9.5 8.7*   PT/INR No results for input(s): LABPROT, INR in the last 72 hours.   Recent Labs Lab 04/20/15 0838 04/21/15 0525  AST 302* 178*  ALT 118* 115*  ALKPHOS 43 39  BILITOT 2.1* 2.9*  PROT 6.3* 5.7*  ALBUMIN 3.4* 2.8*     Lipase     Component Value Date/Time   LIPASE 65* 04/21/2015 0525     Studies/Results: Ct Abdomen Pelvis Wo Contrast  04/20/2015  CLINICAL DATA:  Post fall now with worsening low back pain. EXAM: CT ABDOMEN AND PELVIS WITHOUT CONTRAST TECHNIQUE: Multidetector CT imaging of the abdomen and pelvis was performed following the standard protocol without IV contrast. COMPARISON:  Lumbar spine MRI - 01/28/2008 FINDINGS: The lack of intravenous contrast  limits the ability to evaluate solid abdominal organs. Normal hepatic contour. Radiopaque gallstones and layering sludge is seen within otherwise normal-appearing gallbladder. No definitive gallbladder wall thickening or pericholecystic fluid on this noncontrast examination. No ascites. The hepatic flexure of the colon is incidentally noted to be interposed adjacent to the anterior aspect of the right lobe of the liver. The bilateral kidneys appear mildly atrophic with diffuse cortical thinning. Otherwise, normal noncontrast appearance of the bilateral kidneys. There is a minimal amount of grossly symmetric bilateral perinephric stranding presumably age and body habitus related. No renal stones. No renal stones are seen along the course of either ureter or the urinary bladder. Normal noncontrast appearance of the urinary bladder given degree distention. No urinary obstruction. Note is made of an approximately 2.9 x 2.2 cm right adrenal gland nodule which appears morphologically similar to the 12/2007 examination. Normal noncontrast appearance of the left adrenal gland, pancreas and spleen. Ingested enteric contrast extends to the level of the proximal descending colon. Scattered colonic diverticulosis without evidence of diverticulitis. No evidence of enteric obstruction. No pneumoperitoneum, pneumatosis or portal venous gas. Large amount of calcified atherosclerotic plaque within a normal caliber abdominal aorta. No bulky retroperitoneal, mesenteric, pelvic or inguinal lymphadenopathy. Normal appearance of the pelvic organs for age. No discrete adnexal lesion. No free fluid in the pelvic cul-de-sac. Limited visualization of the lower thorax  is degraded secondary to patient respiratory artifact. No discrete focal airspace opacities. No pleural effusion. Borderline cardiomegaly. Exuberant calcifications within the mitral valve annulus. Coronary artery calcifications. No pericardial effusion. No acute or aggressive  osseous abnormalities. Moderate to severe multilevel thoracolumbar spine DDD, likely worse at L4-L5 and L5-S1 with disc space height loss, endplate irregularity and sclerosis. Stigmata of DISH within the caudal aspect of the thoracic spine. Small mesenteric fat containing periumbilical hernia. Mild diffuse body wall anasarca, most conspicuous about the midline of the low back. Regional soft tissues appear otherwise normal. IMPRESSION: 1. No acute findings within the abdomen or pelvis. Specifically, no evidence of enteric or urinary obstruction. No acute osseus abnormalities. 2. Moderate severe multilevel thoracolumbar spine DDD, progressed since the 2009 examination. 3. 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 4. Mild atrophy of the bilateral kidneys, nonspecific though could be seen in the setting of medical renal disease. Clinical correlation is advised. 5. Colonic diverticulosis without evidence of diverticulitis. 6. Borderline cardiomegaly with exuberant calcifications within the mitral valve annulus. Coronary artery calcifications. 7. Cholelithiasis without evidence of cholecystitis on this noncontrast examination. Electronically Signed   By: Sandi Mariscal M.D.   On: 04/20/2015 15:03   US Abdomen Complete  04/20/2015  CLINICAL DATA:  Nausea and vomiting for 1 day, history of acute pancreatitis EXAM: ABDOMEN ULTRASOUND COMPLETE COMPARISON:  04/20/2015 FINDINGS: Gallbladder: Well distended with multiple mobile gallstones. No gallbladder wall thickening or pericholecystic fluid is noted. Common bile duct: Diameter: 9.4 mm. Liver: No focal lesion identified. Within normal limits in parenchymal echogenicity. IVC: No abnormality visualized. Pancreas: Not well seen due to overlying bowel gas. Spleen: Size and appearance within normal limits. Right Kidney: Length: 12.5 cm.   Diffuse cortical thinning is noted. Left Kidney: Length: 12.1 cm.  Diffuse cortical thinning is noted. Abdominal aorta: No aneurysm visualized. Other findings: None. IMPRESSION: Cholelithiasis without complicating factors. Electronically Signed   By: Inez Catalina M.D.   On: 04/20/2015 17:58   Mr Abdomen Mrcp Wo Cm  04/21/2015  CLINICAL DATA:  Abdominal pain.  Nausea. EXAM: MRI ABDOMEN WITHOUT CONTRAST  (INCLUDING MRCP) TECHNIQUE: Multiplanar multisequence MR imaging of the abdomen was performed. Heavily T2-weighted images of the biliary and pancreatic ducts were obtained, and three-dimensional MRCP images were rendered by post processing. COMPARISON:  04/20/2015 and MR from 01/21/2008 FINDINGS: Lower chest: Small pleural effusions and right lower lobe airspace consolidation noted. Hepatobiliary: There is hepatic steatosis noted. No focal liver abnormality identified. Stones noted within the gallbladder. These measure up to 1 cm. There is mild intrahepatic bile duct dilatation. Within the distal common bile duct there is a stone measuring 9 x 6 mm, image 27 of series 4. Pancreas: Negative. Spleen: The spleen is borderline enlarged measuring 13 cm in length. Adrenals/Urinary Tract: Susceptibility artifact is identified within the right adrenal nodule compatible with hemorrhage. Stomach/Bowel: The stomach appears normal. No abnormal dilatation of the visualized upper abdominal bowel loops. Vascular/Lymphatic: Normal appearance of the abdominal aorta. No enlarged upper abdominal lymph nodes. Other: No free fluid or fluid collections identified. Musculoskeletal: Normal signal from within the bone marrow. IMPRESSION: 1. Gallstones. The common bile duct is increased in caliber and there is a stone within the distal CBD. 2. Hepatic steatosis 3. Right adrenal nodule. Susceptibility artifact noted suggesting previous hemorrhage. This does not appear significantly changed from 01/21/2008 compatible with a benign process.  Electronically Signed   By: Kerby Moors M.D.   On: 04/21/2015 14:54   Mr 3d Recon At Scanner  04/21/2015  CLINICAL DATA:  Abdominal pain.  Nausea. EXAM: MRI ABDOMEN WITHOUT CONTRAST  (INCLUDING MRCP) TECHNIQUE: Multiplanar multisequence MR imaging of the abdomen was performed. Heavily T2-weighted images of the biliary and pancreatic ducts were obtained, and three-dimensional MRCP images were rendered by post processing. COMPARISON:  04/20/2015 and MR from 01/21/2008 FINDINGS: Lower chest: Small pleural effusions and right lower lobe airspace consolidation noted. Hepatobiliary: There is hepatic steatosis noted. No focal liver abnormality identified. Stones noted within the gallbladder. These measure up to 1 cm. There is mild intrahepatic bile duct dilatation. Within the distal common bile duct there is a stone measuring 9 x 6 mm, image 27 of series 4. Pancreas: Negative. Spleen: The spleen is borderline enlarged measuring 13 cm in length. Adrenals/Urinary Tract: Susceptibility artifact is identified within the right adrenal nodule compatible with hemorrhage. Stomach/Bowel: The stomach appears normal. No abnormal dilatation of the visualized upper abdominal bowel loops. Vascular/Lymphatic: Normal appearance of the abdominal aorta. No enlarged upper abdominal lymph nodes. Other: No free fluid or fluid collections identified. Musculoskeletal: Normal signal from within the bone marrow. IMPRESSION: 1. Gallstones. The common bile duct is increased in caliber and there is a stone within the distal CBD. 2. Hepatic steatosis 3. Right adrenal nodule. Susceptibility artifact noted suggesting previous hemorrhage. This does not appear significantly changed from 01/21/2008 compatible with a benign process. Electronically Signed   By: Kerby Moors M.D.   On: 04/21/2015 14:54    Medications: . amLODipine  5 mg Oral Daily  . ampicillin-sulbactam (UNASYN) IV  1.5 g Intravenous Q8H  . budesonide-formoterol  1 puff  Inhalation BID  . carvedilol  12.5 mg Oral BID WC  . citalopram  20 mg Oral Daily  . cloNIDine  0.2 mg Oral BID  . gabapentin  300 mg Oral QID  . glimepiride  2 mg Oral Q breakfast  . insulin aspart  0-15 Units Subcutaneous 6 times per day  . pantoprazole  40 mg Oral Daily  . sodium chloride  3 mL Intravenous Q12H    Assessment/Plan Gallstone pancreatitis COPD OSA AODM ID Peripheral neuropathy with limited mobility (wheelchair and walker in house) Deconditioning  Body mass index is 39.5 DDD Chronic kidney disease Hx of depression/anxiety Dyslipidemia Antibiotics:  ampicillin-sulbactam day 2 DVT:  SCD   Plan:  She is for ERCP today.  We are going to follow.  She has no acute need to get her gallbladder out now.  It would be to prevent her from having a reoccurrence of her pancreatitis.  She needs clearance for surgery.  Will follow with you.     LOS: 2 days    Khandi Kernes 04/22/2015

## 2015-04-22 NOTE — Op Note (Signed)
[facility] [place], [id number]   ERCP PROCEDURE REPORT  PATIENT: Anna [name], Anna [name]  MR#: [id number] BIRTHDATE: 05/21/1934  GENDER: female ENDOSCOPIST: Milus Banister, MD REFERRED BY:  Denzil Magnuson, MD PROCEDURE DATE:  04/22/2015 PROCEDURE:   ERCP with sphincterotomy/papillotomy and ERCP with removal of calculus/calculi INDICATIONS:stone in CBD on MRCP, recent mild pancreatitis, stones in GB. MEDICATIONS: Per Anesthesia TOPICAL ANESTHETIC:   none  DESCRIPTION OF PROCEDURE:     Physical exam was performed.  Informed consent was obtained from the patient after explaining the benefits, risks, and alternatives to the procedure.  The patient was connected to the monitor and placed in the semi-prone position. IV medicine was administered through an indwelling cannula and oxygen via endotracheal tube.  After administration of sedation, the patients esophagus was intubated and the Pentax Ercp Scope [id number]  endoscope was advanced under direct visualization to the second portion of the duodenum.  There was a tubular (skin-tag like) nodule within the distal bile duct, visible when observing the major papilla. It did not appear neoplastic.  A 44 Autotome overa  .035 hydrawire was used to cannulate the CBD and contrast was injected. Cholangiogram revealed a small mobile filling defect in the distal CBD consistent with stone. The CBD was diffusely dilated (up to 9-44mm) without strictures.  An adequate biliary sphincterotomy was performed over the biliary wire. During sphincterotomy the tubular nodule was cauterized free from the bile duct. This 5-68mm long tissue was suctioned into a polyp trap and sent to pathology. A biliary retrieval balloon was then used to sweep the bile duct several times.   Several small brown stone pieces were swept into the duodenum. There was no purulence. A completion, occlusion cholangiogram showed no persistent  filling defects.      The scope was then completely withdrawn from the patient and the procedure terminated.  The pancreatic duct was never cannulated with wire or injected with dye.    COMPLICATIONS:    None  ENDOSCOPIC IMPRESSION: Choledocholithiasis, treated with biliary sphincterotomy and balloon sweeping. Also small soft tissue nodule at the major papilla was cauterized free during biliary sphinctertomy and retrieved to be sent to pathology.  RECOMMENDATIONS: Follow clinically.  Lap chole timing per general surgery. Await final path from the distal CBD nodule.   _______________________________ eSigned:  Milus Banister, MD 04/22/2015 4:30 PM

## 2015-04-22 NOTE — Progress Notes (Signed)
Patient's blood pressure after 10mg  of Hydralazine remained at 207/46. Dr. Marcie Bal did not want to treat any further. Patient brought back to room.

## 2015-04-22 NOTE — Progress Notes (Signed)
Inpatient Diabetes Program Recommendations  AACE/ADA: New Consensus Statement on Inpatient Glycemic Control (2015)  Target Ranges:  Prepandial:   less than 140 mg/dL      Peak postprandial:   less than 180 mg/dL (1-2 hours)      Critically ill patients:  140 - 180 mg/dL   Review of Glycemic Control  Inpatient Diabetes Program Recommendations:  Insulin - Basal: add Lantus or Levemir 15 units  Thank you  Raoul Pitch BSN, RN,CDE Inpatient Diabetes Coordinator [phone] (team pager)

## 2015-04-22 NOTE — Progress Notes (Signed)
PATIENT DETAILS Name: Anna [name] Age: 80 y.o. Sex: female Date of Birth: May 24, 1934 Admit Date: 04/20/2015 Admitting Physician Waldemar Dickens, MD MD:[id number] MARGARET, FNP  Subjective: Abdominal pain has resolved  Assessment/Plan: Principal Problem: Acute biliary pancreatitis: Improved, abdomen is soft and nontender, start clear liquids. MRCP positive for choledocholithiasis, ERCP today, general surgery contemplating cholecystectomy depending on ERCP results and clinical course. May need cardiac clearance prior to surgery. Check echocardiogram  Active Problems: Choledocholithiasis: See above.   Acute on chronic kidney disease stage IV: ARF secondary to previous azotemia due to pancreatitis. Creatinine back to usual baseline with IV fluids. Follow.   Insulin-dependent type 2 diabetes: CBGs currently stable with SSI and glimepiride, once diet is stable, we will resume usual insulin 75/25 regimen  Hypertension:  better controlled today-continue amlodipine, clonidine, Coreg at current doses--we will reassess tomorrow and adjust doses of medications accordingly.  Dyslipidemia: Resume antilipid treatment once LFTs have normalized.  GERD: Continue PPI  Peripheral neuropathy: Continue Neurontin   Anemia: Likely multifactorial from CKD and iron deficiency anemia (per PCP note has history of iron deficiency) hemoglobin currently stable and close to usual baseline   COPD: lungs clear on exam-continue Symbicort, add as needed albuterol.  Depression with anxiety: Appears stable, continue with Celexa  Disposition: Remain inpatient-home of the next 2-3 days  Antimicrobial agents  See below  Anti-infectives    Start     Dose/Rate Route Frequency Ordered Stop   04/21/15 1600  ampicillin-sulbactam (UNASYN) 1.5 g in sodium chloride 0.9 % 50 mL IVPB     1.5 g 100 mL/hr over 30 Minutes Intravenous Every 8 hours 04/21/15 1511     04/21/15 1515   Ampicillin-Sulbactam (UNASYN) 3 g in sodium chloride 0.9 % 100 mL IVPB  Status:  Discontinued     3 g 100 mL/hr over 60 Minutes Intravenous  Once 04/21/15 1501 04/21/15 1502      DVT Prophylaxis: Prophylactic Lovenox   Code Status: Full code   Family Communication Multiple family members at bedside  Procedures: None  CONSULTS:  GI  Time spent 30 minutes-Greater than 50% of this time was spent in counseling, explanation of diagnosis, planning of further management, and coordination of care.  MEDICATIONS: Scheduled Meds: . amLODipine  5 mg Oral Daily  . ampicillin-sulbactam (UNASYN) IV  1.5 g Intravenous Q8H  . budesonide-formoterol  1 puff Inhalation BID  . carvedilol  12.5 mg Oral BID WC  . citalopram  20 mg Oral Daily  . cloNIDine  0.2 mg Oral BID  . gabapentin  300 mg Oral QID  . glimepiride  2 mg Oral Q breakfast  . insulin aspart  0-15 Units Subcutaneous 6 times per day  . pantoprazole  40 mg Oral Daily  . sodium chloride  3 mL Intravenous Q12H   Continuous Infusions: . sodium chloride 75 mL/hr at 04/21/15 1456   PRN Meds:.acetaminophen **OR** acetaminophen, albuterol, hydrALAZINE, morphine injection, ondansetron (ZOFRAN) IV    PHYSICAL EXAM: Vital signs in last 24 hours: Filed Vitals:   04/21/15 2249 04/22/15 0606 04/22/15 1023 04/22/15 1252  BP:  172/61  165/83  Pulse:  72 74 72  Temp:  98 F (36.7 C)  98.6 F (37 C)  TempSrc:  Oral  Oral  Resp:  18 18 20   Height:      Weight:  104.736 kg (230 lb 14.4 oz)    SpO2: 93% 91% 92%  93%    Weight change: -0.499 kg (-1 lb 1.6 oz) Filed Weights   04/20/15 2103 04/21/15 0430 04/22/15 0606  Weight: 104.282 kg (229 lb 14.4 oz) 104.463 kg (230 lb 4.8 oz) 104.736 kg (230 lb 14.4 oz)   Body mass index is 39.61 kg/(m^2).   Gen Exam: Awake and alert with clear speech.   Neck: Supple, No JVD.   Chest: B/L Clear.   no rales or rhonchi  CVS: S1 S2 Regular, no murmurs.  Abdomen: soft, BS +, non tender, non  distended.  Extremities: no edema, lower extremities warm to touch. Neurologic: Non Focal.   Skin: No Rash.   Wounds: N/A.   Intake/Output from previous day:  Intake/Output Summary (Last 24 hours) at 04/22/15 1419 Last data filed at 04/22/15 1350  Gross per 24 hour  Intake      0 ml  Output   1200 ml  Net  -1200 ml     LAB RESULTS: CBC  Recent Labs Lab 04/20/15 0838 04/21/15 0525 04/22/15 1030  WBC 9.4 6.9 7.9  HGB 11.2* 10.8* 10.4*  HCT 37.7 34.9* 34.1*  PLT 182 154 157  MCV 95.2 95.4 96.6  MCH 28.3 29.5 29.5  MCHC 29.7* 30.9 30.5  RDW 19.3* 19.3* 19.2*    Chemistries   Recent Labs Lab 04/20/15 0838 04/21/15 0525 04/22/15 1030  NA 140 140 144  K 5.0 4.3 4.5  CL 102 106 111  CO2 25 26 23   GLUCOSE 264* 227* 231*  BUN 57* 40* 30*  CREATININE 2.25* 1.85* 1.54*  CALCIUM 9.5 8.7* 8.6*    CBG:  Recent Labs Lab 04/21/15 2020 04/22/15 0013 04/22/15 0426 04/22/15 0751 04/22/15 1135  GLUCAP 186* 182* 194* 216* 213*    GFR Estimated Creatinine Clearance: 35.5 mL/min (by C-G formula based on Cr of 1.54).  Coagulation profile No results for input(s): INR, PROTIME in the last 168 hours.  Cardiac Enzymes No results for input(s): CKMB, TROPONINI, MYOGLOBIN in the last 168 hours.  Invalid input(s): CK  Invalid input(s): POCBNP No results for input(s): DDIMER in the last 72 hours.  Recent Labs  04/20/15 1833  HGBA1C 6.3*   No results for input(s): CHOL, HDL, LDLCALC, TRIG, CHOLHDL, LDLDIRECT in the last 72 hours. No results for input(s): TSH, T4TOTAL, T3FREE, THYROIDAB in the last 72 hours.  Invalid input(s): FREET3 No results for input(s): VITAMINB12, FOLATE, FERRITIN, TIBC, IRON, RETICCTPCT in the last 72 hours.  Recent Labs  04/20/15 0838 04/21/15 0525  LIPASE 876* 65*    Urine Studies No results for input(s): UHGB, CRYS in the last 72 hours.  Invalid input(s): UACOL, UAPR, USPG, UPH, UTP, UGL, UKET, UBIL, UNIT, UROB, ULEU, UEPI,  UWBC, URBC, UBAC, CAST, UCOM, BILUA  MICROBIOLOGY: Recent Results (from the past 240 hour(s))  Blood Culture (routine x 2)     Status: None (Preliminary result)   Collection Time: 04/20/15  8:50 AM  Result Value Ref Range Status   Specimen Description BLOOD RIGHT ANTECUBITAL  Final   Special Requests BOTTLES DRAWN AEROBIC AND ANAEROBIC 10MLS  Final   Culture NO GROWTH 1 DAY  Final   Report Status PENDING  Incomplete  Blood Culture (routine x 2)     Status: None (Preliminary result)   Collection Time: 04/20/15  9:05 AM  Result Value Ref Range Status   Specimen Description BLOOD RIGHT ANTECUBITAL  Final   Special Requests BOTTLES DRAWN AEROBIC AND ANAEROBIC 10MLS  Final   Culture NO GROWTH 1 DAY  Final   Report Status PENDING  Incomplete  Urine culture     Status: None   Collection Time: 04/20/15 11:11 AM  Result Value Ref Range Status   Specimen Description URINE, CATHETERIZED  Final   Special Requests NONE  Final   Culture   Final    >=100,000 COLONIES/mL GROUP B STREP(S.AGALACTIAE)ISOLATED TESTING AGAINST S. AGALACTIAE NOT ROUTINELY PERFORMED DUE TO PREDICTABILITY OF AMP/PEN/VAN SUSCEPTIBILITY.    Report Status 04/21/2015 FINAL  Final    RADIOLOGY STUDIES/RESULTS: Ct Abdomen Pelvis Wo Contrast  04/20/2015  CLINICAL DATA:  Post fall now with worsening low back pain. EXAM: CT ABDOMEN AND PELVIS WITHOUT CONTRAST TECHNIQUE: Multidetector CT imaging of the abdomen and pelvis was performed following the standard protocol without IV contrast. COMPARISON:  Lumbar spine MRI - 01/28/2008 FINDINGS: The lack of intravenous contrast limits the ability to evaluate solid abdominal organs. Normal hepatic contour. Radiopaque gallstones and layering sludge is seen within otherwise normal-appearing gallbladder. No definitive gallbladder wall thickening or pericholecystic fluid on this noncontrast examination. No ascites. The hepatic flexure of the colon is incidentally noted to be interposed adjacent  to the anterior aspect of the right lobe of the liver. The bilateral kidneys appear mildly atrophic with diffuse cortical thinning. Otherwise, normal noncontrast appearance of the bilateral kidneys. There is a minimal amount of grossly symmetric bilateral perinephric stranding presumably age and body habitus related. No renal stones. No renal stones are seen along the course of either ureter or the urinary bladder. Normal noncontrast appearance of the urinary bladder given degree distention. No urinary obstruction. Note is made of an approximately 2.9 x 2.2 cm right adrenal gland nodule which appears morphologically similar to the 12/2007 examination. Normal noncontrast appearance of the left adrenal gland, pancreas and spleen. Ingested enteric contrast extends to the level of the proximal descending colon. Scattered colonic diverticulosis without evidence of diverticulitis. No evidence of enteric obstruction. No pneumoperitoneum, pneumatosis or portal venous gas. Large amount of calcified atherosclerotic plaque within a normal caliber abdominal aorta. No bulky retroperitoneal, mesenteric, pelvic or inguinal lymphadenopathy. Normal appearance of the pelvic organs for age. No discrete adnexal lesion. No free fluid in the pelvic cul-de-sac. Limited visualization of the lower thorax is degraded secondary to patient respiratory artifact. No discrete focal airspace opacities. No pleural effusion. Borderline cardiomegaly. Exuberant calcifications within the mitral valve annulus. Coronary artery calcifications. No pericardial effusion. No acute or aggressive osseous abnormalities. Moderate to severe multilevel thoracolumbar spine DDD, likely worse at L4-L5 and L5-S1 with disc space height loss, endplate irregularity and sclerosis. Stigmata of DISH within the caudal aspect of the thoracic spine. Small mesenteric fat containing periumbilical hernia. Mild diffuse body wall anasarca, most conspicuous about the midline of the  low back. Regional soft tissues appear otherwise normal. IMPRESSION: 1. No acute findings within the abdomen or pelvis. Specifically, no evidence of enteric or urinary obstruction. No acute osseus abnormalities. 2. Moderate severe multilevel thoracolumbar spine DDD, progressed since the 2009 examination. 3. 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 4. Mild atrophy of the bilateral kidneys, nonspecific though could be seen in the setting of medical renal disease. Clinical correlation is advised. 5. Colonic diverticulosis without evidence of diverticulitis. 6. Borderline cardiomegaly with exuberant calcifications within the mitral valve annulus. Coronary artery calcifications. 7. Cholelithiasis without evidence of cholecystitis on this noncontrast examination. Electronically Signed   By: Sandi Mariscal  M.D.   On: 04/20/2015 15:03   US Abdomen Complete  04/20/2015  CLINICAL DATA:  Nausea and vomiting for 1 day, history of acute pancreatitis EXAM: ABDOMEN ULTRASOUND COMPLETE COMPARISON:  04/20/2015 FINDINGS: Gallbladder: Well distended with multiple mobile gallstones. No gallbladder wall thickening or pericholecystic fluid is noted. Common bile duct: Diameter: 9.4 mm. Liver: No focal lesion identified. Within normal limits in parenchymal echogenicity. IVC: No abnormality visualized. Pancreas: Not well seen due to overlying bowel gas. Spleen: Size and appearance within normal limits. Right Kidney: Length: 12.5 cm.  Diffuse cortical thinning is noted. Left Kidney: Length: 12.1 cm.  Diffuse cortical thinning is noted. Abdominal aorta: No aneurysm visualized. Other findings: None. IMPRESSION: Cholelithiasis without complicating factors. Electronically Signed   By: Inez Catalina M.D.   On: 04/20/2015 17:58   Mr Abdomen Mrcp Wo Cm  04/21/2015  CLINICAL DATA:  Abdominal pain.  Nausea.  EXAM: MRI ABDOMEN WITHOUT CONTRAST  (INCLUDING MRCP) TECHNIQUE: Multiplanar multisequence MR imaging of the abdomen was performed. Heavily T2-weighted images of the biliary and pancreatic ducts were obtained, and three-dimensional MRCP images were rendered by post processing. COMPARISON:  04/20/2015 and MR from 01/21/2008 FINDINGS: Lower chest: Small pleural effusions and right lower lobe airspace consolidation noted. Hepatobiliary: There is hepatic steatosis noted. No focal liver abnormality identified. Stones noted within the gallbladder. These measure up to 1 cm. There is mild intrahepatic bile duct dilatation. Within the distal common bile duct there is a stone measuring 9 x 6 mm, image 27 of series 4. Pancreas: Negative. Spleen: The spleen is borderline enlarged measuring 13 cm in length. Adrenals/Urinary Tract: Susceptibility artifact is identified within the right adrenal nodule compatible with hemorrhage. Stomach/Bowel: The stomach appears normal. No abnormal dilatation of the visualized upper abdominal bowel loops. Vascular/Lymphatic: Normal appearance of the abdominal aorta. No enlarged upper abdominal lymph nodes. Other: No free fluid or fluid collections identified. Musculoskeletal: Normal signal from within the bone marrow. IMPRESSION: 1. Gallstones. The common bile duct is increased in caliber and there is a stone within the distal CBD. 2. Hepatic steatosis 3. Right adrenal nodule. Susceptibility artifact noted suggesting previous hemorrhage. This does not appear significantly changed from 01/21/2008 compatible with a benign process. Electronically Signed   By: Kerby Moors M.D.   On: 04/21/2015 14:54   Mr 3d Recon At Scanner  04/21/2015  CLINICAL DATA:  Abdominal pain.  Nausea. EXAM: MRI ABDOMEN WITHOUT CONTRAST  (INCLUDING MRCP) TECHNIQUE: Multiplanar multisequence MR imaging of the abdomen was performed. Heavily T2-weighted images of the biliary and pancreatic ducts were obtained, and  three-dimensional MRCP images were rendered by post processing. COMPARISON:  04/20/2015 and MR from 01/21/2008 FINDINGS: Lower chest: Small pleural effusions and right lower lobe airspace consolidation noted. Hepatobiliary: There is hepatic steatosis noted. No focal liver abnormality identified. Stones noted within the gallbladder. These measure up to 1 cm. There is mild intrahepatic bile duct dilatation. Within the distal common bile duct there is a stone measuring 9 x 6 mm, image 27 of series 4. Pancreas: Negative. Spleen: The spleen is borderline enlarged measuring 13 cm in length. Adrenals/Urinary Tract: Susceptibility artifact is identified within the right adrenal nodule compatible with hemorrhage. Stomach/Bowel: The stomach appears normal. No abnormal dilatation of the visualized upper abdominal bowel loops. Vascular/Lymphatic: Normal appearance of the abdominal aorta. No enlarged upper abdominal lymph nodes. Other: No free fluid or fluid collections identified. Musculoskeletal: Normal signal from within the bone marrow. IMPRESSION: 1. Gallstones. The common bile duct is increased in caliber  and there is a stone within the distal CBD. 2. Hepatic steatosis 3. Right adrenal nodule. Susceptibility artifact noted suggesting previous hemorrhage. This does not appear significantly changed from 01/21/2008 compatible with a benign process. Electronically Signed   By: Kerby Moors M.D.   On: 04/21/2015 14:54   Dg Chest Port 1 View  04/20/2015  CLINICAL DATA:  Fever and nausea EXAM: PORTABLE CHEST 1 VIEW COMPARISON:  PA and lateral chest x-ray of January 28, 2008 FINDINGS: The lungs are adequately inflated. There are coarse lung markings in the left retrocardiac region. There is no pleural effusion. The cardiac silhouette is enlarged. The pulmonary vascularity is not engorged. There is increased soft tissue density in the left suprahilar region lying lateral to the aortic arch. The trachea is midline. The bony  thorax exhibits no acute abnormality. IMPRESSION: Stable enlargement cardiac silhouette without significant pulmonary vascular congestion. Increased prominence of the soft tissues in the left hilar and suprahilar regions may be in part due to projection. However, atelectasis or infiltrate in the right upper lobe is not excluded. If the patient can tolerate the procedure, a PA and lateral chest x-ray would be useful. Electronically Signed   By: David  Martinique M.D.   On: 04/20/2015 09:33    Oren Binet, MD  [facility] Pager:[phone]  If 7PM-7AM, please contact night-coverage [email or web address] Password [email or web address] 04/22/2015, 2:19 PM   LOS: 2 days

## 2015-04-22 NOTE — Anesthesia Postprocedure Evaluation (Signed)
Anesthesia Post Note  Patient: Anna [name]  Procedure(s) Performed: Procedure(s) (LRB): ENDOSCOPIC RETROGRADE CHOLANGIOPANCREATOGRAPHY (ERCP) (N/A)  Patient location during evaluation: PACU Anesthesia Type: General Level of consciousness: awake and alert and patient cooperative Pain management: pain level controlled Vital Signs Assessment: post-procedure vital signs reviewed and stable Respiratory status: spontaneous breathing and respiratory function stable Cardiovascular status: stable Anesthetic complications: no    Last Vitals:  Filed Vitals:   04/22/15 1509 04/22/15 1647  BP: 200/54 201/56  Pulse: 71   Temp:  36.8 C  Resp: 26 17    Last Pain:  Filed Vitals:   04/22/15 1648  PainSc: 0-No pain                 Akeisha Lagerquist S

## 2015-04-22 NOTE — Anesthesia Procedure Notes (Signed)
Procedure Name: Intubation Date/Time: 04/22/2015 3:38 PM Performed by: Rebekah Chesterfield L Pre-anesthesia Checklist: Patient identified, Emergency Drugs available, Suction available, Patient being monitored and Timeout performed Patient Re-evaluated:Patient Re-evaluated prior to inductionOxygen Delivery Method: Circle system utilized Preoxygenation: Pre-oxygenation with 100% oxygen Intubation Type: IV induction and Cricoid Pressure applied Ventilation: Mask ventilation without difficulty Laryngoscope Size: McGraph and 4 Grade View: Grade I Tube type: Oral Tube size: 7.5 mm Number of attempts: 1 Airway Equipment and Method: Stylet Placement Confirmation: ETT inserted through vocal cords under direct vision,  positive ETCO2 and breath sounds checked- equal and bilateral Secured at: 20 cm Tube secured with: Tape Dental Injury: Teeth and Oropharynx as per pre-operative assessment

## 2015-04-22 NOTE — H&P (View-Only) (Signed)
On MRCP. pt has 1 cm CBD stone distally. Pt set up for ERCP at 1300 on 12/22. Risks, benefits and technicalities of procedure d/w pt and her family.   She is agreeable. Family asking questions about cholecystectomy as well.  I said it might be that surgeons feel this is too risky.  However, Dr Sloan Leiter will ask surgery to see pt. Allowing pt carb mod diet tonite.  preop abx ordered.  Discontinued the Lovenox, ordered placement of PAS stockings.  2 D echo in progress, ordered at admission yesterday.   Annye English  [id number].

## 2015-04-22 NOTE — Interval H&P Note (Signed)
History and Physical Interval Note:  04/22/2015 3:16 PM  Anna [name]  has presented today for surgery, with the diagnosis of gallstone pancreatitis.  The various methods of treatment have been discussed with the patient and family. After consideration of risks, benefits and other options for treatment, the patient has consented to  Procedure(s): ENDOSCOPIC RETROGRADE CHOLANGIOPANCREATOGRAPHY (ERCP) (N/A) as a surgical intervention .  The patient's history has been reviewed, patient examined, no change in status, stable for surgery.  I have reviewed the patient's chart and labs.  Questions were answered to the patient's satisfaction.     Milus Banister

## 2015-04-22 NOTE — Progress Notes (Signed)
Patient's blood pressure post-op 215/68. Per Dr. Marcie Bal give 10mg  hydralazine. Hydralazine given.

## 2015-04-22 NOTE — Anesthesia Preprocedure Evaluation (Addendum)
Anesthesia Evaluation  Patient identified by MRN, date of birth, ID band Patient awake    Reviewed: Allergy & Precautions, NPO status , Patient's Chart, lab work & pertinent test results  Airway Mallampati: II   Neck ROM: full    Dental   Pulmonary sleep apnea , COPD, former smoker,    breath sounds clear to auscultation       Cardiovascular hypertension,  Rhythm:regular Rate:Normal     Neuro/Psych  Neuromuscular disease    GI/Hepatic GERD  ,  Endo/Other  diabetes, Type 2Morbid obesity  Renal/GU Renal InsufficiencyRenal disease     Musculoskeletal  (+) Arthritis ,   Abdominal   Peds  Hematology   Anesthesia Other Findings Study Conclusions  - Left ventricle: The cavity size was normal. There was mild concentric hypertrophy. Systolic function was normal. The estimated ejection fraction was in the range of 55% to 60%. Wall motion was normal; there were no regional wall motion abnormalities. Features are consistent with a pseudonormal left ventricular filling pattern, with concomitant abnormal relaxation and increased filling pressure (grade 2 diastolic dysfunction). - Aortic valve: There was mild stenosis. Valve area (VTI): 1.74 cm^2. Valve area (Vmax): 1.71 cm^2. Valve area (Vmean): 1.56 cm^2. - Mitral valve: Severely calcified annulus. Mildly thickened leaflets . The findings are consistent with trivial stenosis. There was mild regurgitation. Valve area by pressure half-time: 2.04 cm^2. Valve area by continuity equation (using LVOT flow): 2.03 cm^2. - Left atrium: The atrium was moderately dilated. - Pulmonary arteries: PA peak pressure: 31 mm Hg (S).  Reproductive/Obstetrics                            Anesthesia Physical Anesthesia Plan  ASA: III  Anesthesia Plan: General   Post-op Pain Management:    Induction: Intravenous  Airway Management Planned:  Oral ETT  Additional Equipment:   Intra-op Plan:   Post-operative Plan: Extubation in OR  Informed Consent: I have reviewed the patients History and Physical, chart, labs and discussed the procedure including the risks, benefits and alternatives for the proposed anesthesia with the patient or authorized representative who has indicated his/her understanding and acceptance.     Plan Discussed with: CRNA, Anesthesiologist and Surgeon  Anesthesia Plan Comments:         Anesthesia Quick Evaluation

## 2015-04-23 ENCOUNTER — Encounter (HOSPITAL_COMMUNITY): Payer: Self-pay | Admitting: Gastroenterology

## 2015-04-23 ENCOUNTER — Inpatient Hospital Stay (HOSPITAL_COMMUNITY): Payer: Medicare Other

## 2015-04-23 DIAGNOSIS — R0609 Other forms of dyspnea: Secondary | ICD-10-CM

## 2015-04-23 DIAGNOSIS — R079 Chest pain, unspecified: Secondary | ICD-10-CM

## 2015-04-23 DIAGNOSIS — Z01810 Encounter for preprocedural cardiovascular examination: Secondary | ICD-10-CM

## 2015-04-23 DIAGNOSIS — Z01818 Encounter for other preprocedural examination: Secondary | ICD-10-CM | POA: Insufficient documentation

## 2015-04-23 DIAGNOSIS — K851 Biliary acute pancreatitis without necrosis or infection: Principal | ICD-10-CM

## 2015-04-23 DIAGNOSIS — K8051 Calculus of bile duct without cholangitis or cholecystitis with obstruction: Secondary | ICD-10-CM | POA: Insufficient documentation

## 2015-04-23 LAB — BLOOD GAS, ARTERIAL
Acid-base deficit: 0.3 mmol/L (ref 0.0–2.0)
Bicarbonate: 24.4 mEq/L — ABNORMAL HIGH (ref 20.0–24.0)
DRAWN BY: 365271
O2 Content: 4 L/min
O2 Saturation: 86.7 %
PH ART: 7.361 (ref 7.350–7.450)
Patient temperature: 98.6
TCO2: 25.8 mmol/L (ref 0–100)
pCO2 arterial: 44.2 mmHg (ref 35.0–45.0)
pO2, Arterial: 55.1 mmHg — ABNORMAL LOW (ref 80.0–100.0)

## 2015-04-23 LAB — CBC
HCT: 36.9 % (ref 36.0–46.0)
Hemoglobin: 10.6 g/dL — ABNORMAL LOW (ref 12.0–15.0)
MCH: 28 pg (ref 26.0–34.0)
MCHC: 28.7 g/dL — ABNORMAL LOW (ref 30.0–36.0)
MCV: 97.4 fL (ref 78.0–100.0)
PLATELETS: 174 10*3/uL (ref 150–400)
RBC: 3.79 MIL/uL — AB (ref 3.87–5.11)
RDW: 19 % — AB (ref 11.5–15.5)
WBC: 7.7 10*3/uL (ref 4.0–10.5)

## 2015-04-23 LAB — COMPREHENSIVE METABOLIC PANEL
ALBUMIN: 2.5 g/dL — AB (ref 3.5–5.0)
ALT: 126 U/L — AB (ref 14–54)
AST: 225 U/L — AB (ref 15–41)
Alkaline Phosphatase: 75 U/L (ref 38–126)
Anion gap: 10 (ref 5–15)
BUN: 33 mg/dL — AB (ref 6–20)
CHLORIDE: 110 mmol/L (ref 101–111)
CO2: 25 mmol/L (ref 22–32)
Calcium: 8.7 mg/dL — ABNORMAL LOW (ref 8.9–10.3)
Creatinine, Ser: 1.67 mg/dL — ABNORMAL HIGH (ref 0.44–1.00)
GFR calc Af Amer: 33 mL/min — ABNORMAL LOW (ref >60)
GFR calc non Af Amer: 28 mL/min — ABNORMAL LOW (ref >60)
GLUCOSE: 245 mg/dL — AB (ref 65–99)
POTASSIUM: 4.5 mmol/L (ref 3.5–5.1)
Sodium: 145 mmol/L (ref 135–145)
Total Bilirubin: 3.6 mg/dL — ABNORMAL HIGH (ref 0.3–1.2)
Total Protein: 6.4 g/dL — ABNORMAL LOW (ref 6.5–8.1)

## 2015-04-23 LAB — BASIC METABOLIC PANEL
ANION GAP: 10 (ref 5–15)
BUN: 34 mg/dL — ABNORMAL HIGH (ref 6–20)
CHLORIDE: 111 mmol/L (ref 101–111)
CO2: 23 mmol/L (ref 22–32)
CREATININE: 1.6 mg/dL — AB (ref 0.44–1.00)
Calcium: 8.4 mg/dL — ABNORMAL LOW (ref 8.9–10.3)
GFR calc non Af Amer: 30 mL/min — ABNORMAL LOW (ref >60)
GFR, EST AFRICAN AMERICAN: 34 mL/min — AB (ref >60)
Glucose, Bld: 233 mg/dL — ABNORMAL HIGH (ref 65–99)
POTASSIUM: 4.2 mmol/L (ref 3.5–5.1)
SODIUM: 144 mmol/L (ref 135–145)

## 2015-04-23 LAB — BRAIN NATRIURETIC PEPTIDE: B NATRIURETIC PEPTIDE 5: 298.3 pg/mL — AB (ref 0.0–100.0)

## 2015-04-23 LAB — GLUCOSE, CAPILLARY
GLUCOSE-CAPILLARY: 212 mg/dL — AB (ref 65–99)
GLUCOSE-CAPILLARY: 304 mg/dL — AB (ref 65–99)
GLUCOSE-CAPILLARY: 365 mg/dL — AB (ref 65–99)
Glucose-Capillary: 201 mg/dL — ABNORMAL HIGH (ref 65–99)
Glucose-Capillary: 205 mg/dL — ABNORMAL HIGH (ref 65–99)
Glucose-Capillary: 223 mg/dL — ABNORMAL HIGH (ref 65–99)
Glucose-Capillary: 328 mg/dL — ABNORMAL HIGH (ref 65–99)

## 2015-04-23 LAB — D-DIMER, QUANTITATIVE (NOT AT ARMC): D DIMER QUANT: 2.52 ug{FEU}/mL — AB (ref 0.00–0.50)

## 2015-04-23 MED ORDER — PIPERACILLIN-TAZOBACTAM 3.375 G IVPB
3.3750 g | Freq: Three times a day (TID) | INTRAVENOUS | Status: DC
  Administered 2015-04-23 – 2015-04-24 (×5): 3.375 g via INTRAVENOUS
  Filled 2015-04-23 (×7): qty 50

## 2015-04-23 MED ORDER — NITROGLYCERIN 2 % TD OINT
0.5000 [in_us] | TOPICAL_OINTMENT | Freq: Four times a day (QID) | TRANSDERMAL | Status: DC
  Administered 2015-04-23 – 2015-04-25 (×7): 0.5 [in_us] via TOPICAL
  Filled 2015-04-23: qty 30

## 2015-04-23 MED ORDER — VANCOMYCIN HCL 10 G IV SOLR
1250.0000 mg | INTRAVENOUS | Status: DC
  Administered 2015-04-23 – 2015-04-24 (×2): 1250 mg via INTRAVENOUS
  Filled 2015-04-23 (×3): qty 1250

## 2015-04-23 MED ORDER — DEXTROSE 5 % IV SOLN: INTRAVENOUS | Status: DC

## 2015-04-23 MED ORDER — FUROSEMIDE 10 MG/ML IJ SOLN
20.0000 mg | Freq: Every day | INTRAMUSCULAR | Status: DC
  Administered 2015-04-24 – 2015-04-25 (×2): 20 mg via INTRAVENOUS
  Filled 2015-04-23 (×2): qty 2

## 2015-04-23 MED ORDER — METOPROLOL TARTRATE 1 MG/ML IV SOLN: 5.0000 mg | INTRAVENOUS | Status: DC | PRN

## 2015-04-23 MED ORDER — INSULIN GLARGINE 100 UNIT/ML ~~LOC~~ SOLN
10.0000 [IU] | Freq: Two times a day (BID) | SUBCUTANEOUS | Status: DC
  Administered 2015-04-23: 10 [IU] via SUBCUTANEOUS
  Filled 2015-04-23 (×4): qty 0.1

## 2015-04-23 MED ORDER — FUROSEMIDE 40 MG PO TABS
40.0000 mg | ORAL_TABLET | Freq: Once | ORAL | Status: AC
  Administered 2015-04-23: 40 mg via ORAL
  Filled 2015-04-23: qty 1

## 2015-04-23 MED ORDER — METHYLPREDNISOLONE SODIUM SUCC 125 MG IJ SOLR
60.0000 mg | Freq: Four times a day (QID) | INTRAMUSCULAR | Status: DC
  Administered 2015-04-23 (×2): 60 mg via INTRAVENOUS
  Filled 2015-04-23 (×2): qty 2

## 2015-04-23 MED ORDER — FUROSEMIDE 10 MG/ML IJ SOLN
20.0000 mg | Freq: Once | INTRAMUSCULAR | Status: AC
  Administered 2015-04-23: 20 mg via INTRAVENOUS
  Filled 2015-04-23: qty 2

## 2015-04-23 MED ORDER — POTASSIUM CHLORIDE CRYS ER 20 MEQ PO TBCR
20.0000 meq | EXTENDED_RELEASE_TABLET | Freq: Once | ORAL | Status: AC
  Administered 2015-04-23: 20 meq via ORAL
  Filled 2015-04-23: qty 1

## 2015-04-23 MED ORDER — FUROSEMIDE 10 MG/ML IJ SOLN
40.0000 mg | Freq: Once | INTRAMUSCULAR | Status: AC
  Administered 2015-04-23: 40 mg via INTRAVENOUS
  Filled 2015-04-23: qty 4

## 2015-04-23 NOTE — Evaluation (Signed)
Clinical/Bedside Swallow Evaluation Patient Details  Name: Anna [name] MRN: [id number] Date of Birth: 07-21-1934  Today's Date: 04/23/2015 Time: SLP Start Time (ACUTE ONLY): 1200 SLP Stop Time (ACUTE ONLY): 1215 SLP Time Calculation (min) (ACUTE ONLY): 15 min  Past Medical History:  Past Medical History  Diagnosis Date  . Hypertension   . COPD (chronic obstructive pulmonary disease) ([place])   . Diabetes mellitus without complication ([place])   . Neuromuscular disorder ([place])   . Depression with anxiety   . Osteopenia   . Arthritis   . GERD (gastroesophageal reflux disease)   . Hyperlipidemia   . Obesity   . Vitamin B12 deficiency   . Chronic kidney disease (CKD), stage IV (severe) ([place])   . Sleep apnea     no cpap use  . Anemia     iron infusion- x2 recent , last 5'16  . Hx of adenomatous colonic polyps 09/2014   Past Surgical History:  Past Surgical History  Procedure Laterality Date  . Breast surgery  April 1996    needle biopsy   . Tubal ligation    . Cataract extraction, bilateral Bilateral   . Esophagogastroduodenoscopy N/A 10/19/2014    Procedure: ESOPHAGOGASTRODUODENOSCOPY (EGD);  Surgeon: Gatha Mayer, MD;  Location: [facility];  Service: Endoscopy;  Laterality: N/A;  . Colonoscopy N/A 10/19/2014    Procedure: COLONOSCOPY;  Surgeon: Gatha Mayer, MD;  Location: [facility];  Service: Endoscopy;  Laterality: N/A;  . Ercp N/A 04/22/2015    Procedure: ENDOSCOPIC RETROGRADE CHOLANGIOPANCREATOGRAPHY (ERCP);  Surgeon: Milus Banister, MD;  Location: [place];  Service: Endoscopy;  Laterality: N/A;   HPI:  80 year old female patient with multiple medical problems including hypertension, COPD, diabetes on insulin, peripheral neuropathy and restless leg syndrome, depression with anxiety, chronic back pain, dyslipidemia, sleep apnea and chronic kidney disease stage IV. Choledocholithiasis, treated with biliary sphincterotomy on 12/22. Had respiratory events with upper  airway wheezing early am 12/23, rapid response called. CXR shows Stable pneumonia involving theright upper lobe and right lung base. Developing pneumonia at the left lung base. no documented history of dysphagia Endoscopy this year WNL.    Assessment / Plan / Recommendation Clinical Impression  Pt demonstrates respiratory based dysphagia with increased risk of aspiration due to shortness of breath and likely decreased apneic period during swallow. Pt observed to have cough following large consecutive sips and with a straw, though consistently normal with small single sips. Mastication WNL. Educated pt and family to precautions and risks. They were able to recall strategies. Also discussed strategies for best upright posture in the bed. All education complete, No SLP f/u needed, advance diet when medically ready for solids.     Aspiration Risk  Moderate aspiration risk    Diet Recommendation Regular;Thin liquid   Liquid Administration via: No straw;Cup Medication Administration: Whole meds with puree Supervision: Patient able to self feed Compensations: Slow rate;Small sips/bites Postural Changes: Seated upright at 90 degrees    Other  Recommendations Oral Care Recommendations: Oral care BID   Follow up Recommendations       Frequency and Duration            Prognosis        Swallow Study   General HPI: 80 year old female patient with multiple medical problems including hypertension, COPD, diabetes on insulin, peripheral neuropathy and restless leg syndrome, depression with anxiety, chronic back pain, dyslipidemia, sleep apnea and chronic kidney disease stage IV. Choledocholithiasis, treated with biliary sphincterotomy on 12/22. Had respiratory  events with upper airway wheezing early am 12/23, rapid response called. CXR shows Stable pneumonia involving theright upper lobe and right lung base. Developing pneumonia at the left lung base. no documented history of dysphagia Endoscopy this  year WNL.  Type of Study: Bedside Swallow Evaluation Previous Swallow Assessment: none Diet Prior to this Study: Thin liquids Temperature Spikes Noted: No Respiratory Status: Nasal cannula History of Recent Intubation: No Behavior/Cognition: Alert;Cooperative;Pleasant mood Oral Cavity Assessment: Within Functional Limits Oral Care Completed by SLP: No Oral Cavity - Dentition: Missing dentition Vision: Functional for self-feeding Self-Feeding Abilities: Able to feed self Patient Positioning: Upright in bed Baseline Vocal Quality: Normal Volitional Cough: Strong Volitional Swallow: Able to elicit    Oral/Motor/Sensory Function Overall Oral Motor/Sensory Function: Within functional limits   Ice Chips     Thin Liquid Thin Liquid: Impaired Presentation: Straw;Self Fed;Cup Pharyngeal  Phase Impairments: Cough - Immediate    Nectar Thick Nectar Thick Liquid: Not tested   Honey Thick Honey Thick Liquid: Not tested   Puree Puree: Within functional limits   Solid Solid: Within functional limits Presentation: Self Essie Hart, MA CCC-SLP [phone]  Hanako Tipping, Katherene Ponto 04/23/2015,1:37 PM

## 2015-04-23 NOTE — Progress Notes (Signed)
PT Cancellation Note  Patient Details Name: Anna [name] MRN: [id number] DOB: Aug 04, 1934   Cancelled Treatment:    Reason Eval/Treat Not Completed: Patient at procedure or test/unavailable.  Pt is in endo per RN.  PT will attempt to check back later today.  Thanks,    Barbarann Ehlers. Allisha Harter, PT, DPT [phone]    04/23/2015, 2:07 PM

## 2015-04-23 NOTE — Progress Notes (Addendum)
PATIENT DETAILS Name: Anna [name] Age: 80 y.o. Sex: female Date of Birth: 04/13/1935 Admit Date: 04/20/2015 Admitting Physician Waldemar Dickens, MD MD:[id number] MARGARET, FNP  Subjective:  Patient in bed, sitting up, developed shortness of breath last night but much improved now. No chest pain. Dull right upper quadrant/the gastric abdominal pain. No focal weakness.  Assessment/Plan:  Acute biliary pancreatitis: Improved, abdomen is soft and nontender, start clear liquids. MRCP positive for choledocholithiasis, ERCP today, general surgery contemplating cholecystectomy on 04/24/2015, stable ERCP. Cards requested to evaluate preop.  Acute on chronic diastolic CHF with acute hypoxic respiratory failure on chronic - he did rapid response night of 04/22/2015 -  became acutely short of breath, chest x-ray shows bilateral infiltrates clinically suggestive of fluid overload, she clearly has orthopnea and edema along with rales on exam. She feels much better after she received Lasix and Nitropaste this morning, discontinue fluids, resume home dose Lasix, continue Nitropaste. Currently on Coreg which will be continued. Echo shows an EF preserved at 60% without any wall motion abnormality. Cardiology requested to evaluate as well.  Choledocholithiasis: I clear laparoscopic cholecystectomy on 04/24/2015 by general surgery.   Acute on chronic kidney disease stage IV: ARF secondary to previous azotemia due to pancreatitis. Creatinine back to usual baseline with IV fluids. Follow.   Hypertension:  better controlled today-continue amlodipine, clonidine, Coreg at current doses--we will reassess tomorrow and adjust doses of medications accordingly.  Dyslipidemia: Resume antilipid treatment once LFTs have normalized.  GERD: Continue PPI  Peripheral neuropathy: Continue Neurontin   Anemia: Likely multifactorial from CKD and iron deficiency anemia (per PCP note has history of  iron deficiency) hemoglobin currently stable and close to usual baseline   COPD: lungs clear on exam-continue Symbicort, add as needed albuterol.  Depression with anxiety: Appears stable, continue with Celexa  Insulin-dependent type 2 diabetes: CBGs currently stable with SSI and Lantus 10 units twice a day to be held if she is nothing by mouth or CBG below 120, hold Amaryl as she is going to be going for surgery on 04/24/2015.   Lab Results  Component Value Date   HGBA1C 6.3* 04/20/2015    CBG (last 3)   Recent Labs  04/23/15 0040 04/23/15 0406 04/23/15 0757  GLUCAP 212* 205* 223*      Disposition: Remain inpatient-home of the next 2-3 days  Antimicrobial agents  See below  Anti-infectives    Start     Dose/Rate Route Frequency Ordered Stop   04/23/15 0200  vancomycin (VANCOCIN) 1,250 mg in sodium chloride 0.9 % 250 mL IVPB     1,250 mg 166.7 mL/hr over 90 Minutes Intravenous Every 24 hours 04/23/15 0156     04/23/15 0200  piperacillin-tazobactam (ZOSYN) IVPB 3.375 g     3.375 g 12.5 mL/hr over 240 Minutes Intravenous 3 times per day 04/23/15 0156     04/21/15 1600  ampicillin-sulbactam (UNASYN) 1.5 g in sodium chloride 0.9 % 50 mL IVPB  Status:  Discontinued     1.5 g 100 mL/hr over 30 Minutes Intravenous Every 8 hours 04/21/15 1511 04/23/15 0156   04/21/15 1515  Ampicillin-Sulbactam (UNASYN) 3 g in sodium chloride 0.9 % 100 mL IVPB  Status:  Discontinued     3 g 100 mL/hr over 60 Minutes Intravenous  Once 04/21/15 1501 04/21/15 1502      DVT Prophylaxis: Prophylactic Lovenox   Code Status: Full  code   Family Communication Son at bedside  Procedures:  TTE    CONSULTS:  GI, CCS, cards  Time spent 30 minutes-Greater than 50% of this time was spent in counseling, explanation of diagnosis, planning of further management, and coordination of care.  MEDICATIONS: Scheduled Meds: . amLODipine  5 mg Oral Daily  . budesonide-formoterol  1 puff  Inhalation BID  . carvedilol  12.5 mg Oral BID WC  . citalopram  20 mg Oral Daily  . cloNIDine  0.2 mg Oral BID  . [START ON 04/24/2015] furosemide  20 mg Intravenous Daily  . furosemide  40 mg Oral Once  . gabapentin  300 mg Oral QID  . glimepiride  2 mg Oral Q breakfast  . insulin aspart  0-15 Units Subcutaneous 6 times per day  . nitroGLYCERIN  0.5 inch Topical 4 times per day  . pantoprazole  40 mg Oral Daily  . piperacillin-tazobactam (ZOSYN)  IV  3.375 g Intravenous 3 times per day  . sodium chloride  3 mL Intravenous Q12H  . vancomycin  1,250 mg Intravenous Q24H   Continuous Infusions:   PRN Meds:.acetaminophen **OR** [DISCONTINUED] acetaminophen, albuterol, hydrALAZINE, metoprolol, morphine injection, ondansetron (ZOFRAN) IV    PHYSICAL EXAM: Vital signs in last 24 hours: Filed Vitals:   04/23/15 0024 04/23/15 0136 04/23/15 0542 04/23/15 0753  BP:   156/81   Pulse: 75 66 74 76  Temp:   97.7 F (36.5 C)   TempSrc:   Oral   Resp: 20 22 21 22   Height:      Weight:   102.6 kg (226 lb 3.1 oz)   SpO2: 94% 97% 97% 94%    Weight change: -2.136 kg (-4 lb 11.3 oz) Filed Weights   04/21/15 0430 04/22/15 0606 04/23/15 0542  Weight: 104.463 kg (230 lb 4.8 oz) 104.736 kg (230 lb 14.4 oz) 102.6 kg (226 lb 3.1 oz)   Body mass index is 38.81 kg/(m^2).   Gen Exam: Awake and alert with clear speech.   Neck: Supple, No JVD.   Chest: B/L Clear.  Bibasilar rales  CVS: S1 S2 Regular, no murmurs.  Abdomen: soft, BS +, non tender, non distended.  Extremities: no edema, lower extremities warm to touch. Neurologic: Non Focal.   Skin: No Rash.   Wounds: N/A.   Intake/Output from previous day:  Intake/Output Summary (Last 24 hours) at 04/23/15 1114 Last data filed at 04/23/15 0940  Gross per 24 hour  Intake    860 ml  Output    750 ml  Net    110 ml     LAB RESULTS: CBC  Recent Labs Lab 04/20/15 0838 04/21/15 0525 04/22/15 1030 04/23/15 0617  WBC 9.4 6.9 7.9 7.7    HGB 11.2* 10.8* 10.4* 10.6*  HCT 37.7 34.9* 34.1* 36.9  PLT 182 154 157 174  MCV 95.2 95.4 96.6 97.4  MCH 28.3 29.5 29.5 28.0  MCHC 29.7* 30.9 30.5 28.7*  RDW 19.3* 19.3* 19.2* 19.0*    Chemistries   Recent Labs Lab 04/20/15 0838 04/21/15 0525 04/22/15 1030 04/23/15 0008 04/23/15 0617  NA 140 140 144 144 145  K 5.0 4.3 4.5 4.2 4.5  CL 102 106 111 111 110  CO2 25 26 23 23 25   GLUCOSE 264* 227* 231* 233* 245*  BUN 57* 40* 30* 34* 33*  CREATININE 2.25* 1.85* 1.54* 1.60* 1.67*  CALCIUM 9.5 8.7* 8.6* 8.4* 8.7*    CBG:  Recent Labs Lab 04/22/15 1748 04/22/15 1941 04/23/15  0040 04/23/15 0406 04/23/15 0757  GLUCAP 229* 209* 212* 205* 223*    GFR Estimated Creatinine Clearance: 32.4 mL/min (by C-G formula based on Cr of 1.67).  Coagulation profile No results for input(s): INR, PROTIME in the last 168 hours.  Cardiac Enzymes No results for input(s): CKMB, TROPONINI, MYOGLOBIN in the last 168 hours.  Invalid input(s): CK  Invalid input(s): POCBNP  Recent Labs  04/23/15 0008  DDIMER 2.52*    Recent Labs  04/20/15 1833  HGBA1C 6.3*   No results for input(s): CHOL, HDL, LDLCALC, TRIG, CHOLHDL, LDLDIRECT in the last 72 hours. No results for input(s): TSH, T4TOTAL, T3FREE, THYROIDAB in the last 72 hours.  Invalid input(s): FREET3 No results for input(s): VITAMINB12, FOLATE, FERRITIN, TIBC, IRON, RETICCTPCT in the last 72 hours.  Recent Labs  04/21/15 0525  LIPASE 65*    Urine Studies No results for input(s): UHGB, CRYS in the last 72 hours.  Invalid input(s): UACOL, UAPR, USPG, UPH, UTP, UGL, UKET, UBIL, UNIT, UROB, ULEU, UEPI, UWBC, URBC, UBAC, CAST, UCOM, BILUA  MICROBIOLOGY: Recent Results (from the past 240 hour(s))  Blood Culture (routine x 2)     Status: None (Preliminary result)   Collection Time: 04/20/15  8:50 AM  Result Value Ref Range Status   Specimen Description BLOOD RIGHT ANTECUBITAL  Final   Special Requests BOTTLES DRAWN  AEROBIC AND ANAEROBIC 10MLS  Final   Culture NO GROWTH 3 DAYS  Final   Report Status PENDING  Incomplete  Blood Culture (routine x 2)     Status: None (Preliminary result)   Collection Time: 04/20/15  9:05 AM  Result Value Ref Range Status   Specimen Description BLOOD RIGHT ANTECUBITAL  Final   Special Requests BOTTLES DRAWN AEROBIC AND ANAEROBIC 10MLS  Final   Culture NO GROWTH 3 DAYS  Final   Report Status PENDING  Incomplete  Urine culture     Status: None   Collection Time: 04/20/15 11:11 AM  Result Value Ref Range Status   Specimen Description URINE, CATHETERIZED  Final   Special Requests NONE  Final   Culture   Final    >=100,000 COLONIES/mL GROUP B STREP(S.AGALACTIAE)ISOLATED TESTING AGAINST S. AGALACTIAE NOT ROUTINELY PERFORMED DUE TO PREDICTABILITY OF AMP/PEN/VAN SUSCEPTIBILITY.    Report Status 04/21/2015 FINAL  Final    RADIOLOGY STUDIES/RESULTS: Ct Abdomen Pelvis Wo Contrast  04/20/2015  CLINICAL DATA:  Post fall now with worsening low back pain. EXAM: CT ABDOMEN AND PELVIS WITHOUT CONTRAST TECHNIQUE: Multidetector CT imaging of the abdomen and pelvis was performed following the standard protocol without IV contrast. COMPARISON:  Lumbar spine MRI - 01/28/2008 FINDINGS: The lack of intravenous contrast limits the ability to evaluate solid abdominal organs. Normal hepatic contour. Radiopaque gallstones and layering sludge is seen within otherwise normal-appearing gallbladder. No definitive gallbladder wall thickening or pericholecystic fluid on this noncontrast examination. No ascites. The hepatic flexure of the colon is incidentally noted to be interposed adjacent to the anterior aspect of the right lobe of the liver. The bilateral kidneys appear mildly atrophic with diffuse cortical thinning. Otherwise, normal noncontrast appearance of the bilateral kidneys. There is a minimal amount of grossly symmetric bilateral perinephric stranding presumably age and body habitus related. No  renal stones. No renal stones are seen along the course of either ureter or the urinary bladder. Normal noncontrast appearance of the urinary bladder given degree distention. No urinary obstruction. Note is made of an approximately 2.9 x 2.2 cm right adrenal gland nodule which appears morphologically  similar to the 12/2007 examination. Normal noncontrast appearance of the left adrenal gland, pancreas and spleen. Ingested enteric contrast extends to the level of the proximal descending colon. Scattered colonic diverticulosis without evidence of diverticulitis. No evidence of enteric obstruction. No pneumoperitoneum, pneumatosis or portal venous gas. Large amount of calcified atherosclerotic plaque within a normal caliber abdominal aorta. No bulky retroperitoneal, mesenteric, pelvic or inguinal lymphadenopathy. Normal appearance of the pelvic organs for age. No discrete adnexal lesion. No free fluid in the pelvic cul-de-sac. Limited visualization of the lower thorax is degraded secondary to patient respiratory artifact. No discrete focal airspace opacities. No pleural effusion. Borderline cardiomegaly. Exuberant calcifications within the mitral valve annulus. Coronary artery calcifications. No pericardial effusion. No acute or aggressive osseous abnormalities. Moderate to severe multilevel thoracolumbar spine DDD, likely worse at L4-L5 and L5-S1 with disc space height loss, endplate irregularity and sclerosis. Stigmata of DISH within the caudal aspect of the thoracic spine. Small mesenteric fat containing periumbilical hernia. Mild diffuse body wall anasarca, most conspicuous about the midline of the low back. Regional soft tissues appear otherwise normal. IMPRESSION: 1. No acute findings within the abdomen or pelvis. Specifically, no evidence of enteric or urinary obstruction. No acute osseus abnormalities. 2. Moderate severe multilevel thoracolumbar spine DDD, progressed since the 2009 examination. 3. 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 4. Mild atrophy of the bilateral kidneys, nonspecific though could be seen in the setting of medical renal disease. Clinical correlation is advised. 5. Colonic diverticulosis without evidence of diverticulitis. 6. Borderline cardiomegaly with exuberant calcifications within the mitral valve annulus. Coronary artery calcifications. 7. Cholelithiasis without evidence of cholecystitis on this noncontrast examination. Electronically Signed   By: Sandi Mariscal M.D.   On: 04/20/2015 15:03   US Abdomen Complete  04/20/2015  CLINICAL DATA:  Nausea and vomiting for 1 day, history of acute pancreatitis EXAM: ABDOMEN ULTRASOUND COMPLETE COMPARISON:  04/20/2015 FINDINGS: Gallbladder: Well distended with multiple mobile gallstones. No gallbladder wall thickening or pericholecystic fluid is noted. Common bile duct: Diameter: 9.4 mm. Liver: No focal lesion identified. Within normal limits in parenchymal echogenicity. IVC: No abnormality visualized. Pancreas: Not well seen due to overlying bowel gas. Spleen: Size and appearance within normal limits. Right Kidney: Length: 12.5 cm.  Diffuse cortical thinning is noted. Left Kidney: Length: 12.1 cm.  Diffuse cortical thinning is noted. Abdominal aorta: No aneurysm visualized. Other findings: None. IMPRESSION: Cholelithiasis without complicating factors. Electronically Signed   By: Inez Catalina M.D.   On: 04/20/2015 17:58   Mr Abdomen Mrcp Wo Cm  04/21/2015  CLINICAL DATA:  Abdominal pain.  Nausea. EXAM: MRI ABDOMEN WITHOUT CONTRAST  (INCLUDING MRCP) TECHNIQUE: Multiplanar multisequence MR imaging of the abdomen was performed. Heavily T2-weighted images of the biliary and pancreatic ducts were obtained, and three-dimensional MRCP images were rendered by post processing. COMPARISON:  04/20/2015 and MR from  01/21/2008 FINDINGS: Lower chest: Small pleural effusions and right lower lobe airspace consolidation noted. Hepatobiliary: There is hepatic steatosis noted. No focal liver abnormality identified. Stones noted within the gallbladder. These measure up to 1 cm. There is mild intrahepatic bile duct dilatation. Within the distal common bile duct there is a stone measuring 9 x 6 mm, image 27 of series 4. Pancreas: Negative. Spleen: The spleen is borderline enlarged measuring 13 cm in length. Adrenals/Urinary Tract: Susceptibility artifact is identified within the right adrenal nodule compatible  with hemorrhage. Stomach/Bowel: The stomach appears normal. No abnormal dilatation of the visualized upper abdominal bowel loops. Vascular/Lymphatic: Normal appearance of the abdominal aorta. No enlarged upper abdominal lymph nodes. Other: No free fluid or fluid collections identified. Musculoskeletal: Normal signal from within the bone marrow. IMPRESSION: 1. Gallstones. The common bile duct is increased in caliber and there is a stone within the distal CBD. 2. Hepatic steatosis 3. Right adrenal nodule. Susceptibility artifact noted suggesting previous hemorrhage. This does not appear significantly changed from 01/21/2008 compatible with a benign process. Electronically Signed   By: Kerby Moors M.D.   On: 04/21/2015 14:54   Mr 3d Recon At Scanner  04/21/2015  CLINICAL DATA:  Abdominal pain.  Nausea. EXAM: MRI ABDOMEN WITHOUT CONTRAST  (INCLUDING MRCP) TECHNIQUE: Multiplanar multisequence MR imaging of the abdomen was performed. Heavily T2-weighted images of the biliary and pancreatic ducts were obtained, and three-dimensional MRCP images were rendered by post processing. COMPARISON:  04/20/2015 and MR from 01/21/2008 FINDINGS: Lower chest: Small pleural effusions and right lower lobe airspace consolidation noted. Hepatobiliary: There is hepatic steatosis noted. No focal liver abnormality identified. Stones noted within the  gallbladder. These measure up to 1 cm. There is mild intrahepatic bile duct dilatation. Within the distal common bile duct there is a stone measuring 9 x 6 mm, image 27 of series 4. Pancreas: Negative. Spleen: The spleen is borderline enlarged measuring 13 cm in length. Adrenals/Urinary Tract: Susceptibility artifact is identified within the right adrenal nodule compatible with hemorrhage. Stomach/Bowel: The stomach appears normal. No abnormal dilatation of the visualized upper abdominal bowel loops. Vascular/Lymphatic: Normal appearance of the abdominal aorta. No enlarged upper abdominal lymph nodes. Other: No free fluid or fluid collections identified. Musculoskeletal: Normal signal from within the bone marrow. IMPRESSION: 1. Gallstones. The common bile duct is increased in caliber and there is a stone within the distal CBD. 2. Hepatic steatosis 3. Right adrenal nodule. Susceptibility artifact noted suggesting previous hemorrhage. This does not appear significantly changed from 01/21/2008 compatible with a benign process. Electronically Signed   By: Kerby Moors M.D.   On: 04/21/2015 14:54   Dg Chest Port 1 View  04/23/2015  CLINICAL DATA:  80 year old with 3 day history of shortness of breath and nonproductive cough. Current history of hypertension, diabetes, COPD and stage 4 chronic kidney disease. EXAM: PORTABLE CHEST 1 VIEW COMPARISON:  04/22/2015 and earlier. FINDINGS: Markedly suboptimal inspiration. No significant change in the airspace opacities in the right lung base and right upper lobe. Pulmonary venous hypertension without overt edema, unchanged. Developing airspace consolidation in the medial left lung base. IMPRESSION: Markedly suboptimal inspiration. Stable pneumonia involving the right upper lobe and right lung base. Developing pneumonia at the left lung base. Electronically Signed   By: Evangeline Dakin M.D.   On: 04/23/2015 08:10   Dg Chest Port 1 View  04/23/2015  CLINICAL DATA:   Hypoxia. EXAM: PORTABLE CHEST 1 VIEW COMPARISON:  04/21/2015 FINDINGS: Cardiomediastinal silhouette is enlarged. Mediastinal contours appear intact. The aorta is torturous and contains atherosclerotic calcifications There is no evidence of pleural effusion or pneumothorax. There is opacification of the right lower lobe. More subtle airspace consolidation is also seen in the left cardiophrenic angle. Osseous structures are without acute abnormality. Soft tissues are grossly normal. IMPRESSION: Enlarged cardiac silhouette. Large area of airspace opacification in the right lower lobe, and more subtle opacification in the left lower lobe. Differential diagnosis includes multifocal pneumonia or less likely pulmonary edema. An area of more pronounced increased  density in the right mid lung, with which pulmonary mass cannot be excluded. Electronically Signed   By: Fidela Salisbury M.D.   On: 04/23/2015 00:49   Dg Chest Port 1 View  04/20/2015  CLINICAL DATA:  Fever and nausea EXAM: PORTABLE CHEST 1 VIEW COMPARISON:  PA and lateral chest x-ray of January 28, 2008 FINDINGS: The lungs are adequately inflated. There are coarse lung markings in the left retrocardiac region. There is no pleural effusion. The cardiac silhouette is enlarged. The pulmonary vascularity is not engorged. There is increased soft tissue density in the left suprahilar region lying lateral to the aortic arch. The trachea is midline. The bony thorax exhibits no acute abnormality. IMPRESSION: Stable enlargement cardiac silhouette without significant pulmonary vascular congestion. Increased prominence of the soft tissues in the left hilar and suprahilar regions may be in part due to projection. However, atelectasis or infiltrate in the right upper lobe is not excluded. If the patient can tolerate the procedure, a PA and lateral chest x-ray would be useful. Electronically Signed   By: David  Martinique M.D.   On: 04/20/2015 09:33   Dg Ercp Biliary &  Pancreatic Ducts  04/22/2015  CLINICAL DATA:  Gallstone. EXAM: ERCP TECHNIQUE: Multiple spot images obtained with the fluoroscopic device and submitted for interpretation post-procedure. FLUOROSCOPY TIME:  If the device does not provide the exposure index: Fluoroscopy Time:  2 minutes and 36 seconds Number of Acquired Images:  3 COMPARISON:  MRI 04/21/2015 FINDINGS: Wire was advanced into the common bile duct. Evidence for a sphincterotomy procedure. Contrast opacification of the biliary system demonstrates a dilated common bile duct. No definite filling defects in the common bile duct. IMPRESSION: Dilated common bile duct. These images were submitted for radiologic interpretation only. Please see the procedural report for the amount of contrast and the fluoroscopy time utilized. Electronically Signed   By: Markus Daft M.D.   On: 04/22/2015 19:32    Thurnell Lose, MD  [facility] Pager:[phone]  If 7PM-7AM, please contact night-coverage [email or web address] Password [email or web address] 04/23/2015, 11:14 AM   LOS: 3 days

## 2015-04-23 NOTE — Care Management Important Message (Signed)
Important Message  Patient Details  Name: Anna [name] MRN: [id number] Date of Birth: 10-29-1934   Medicare Important Message Given:  Yes    [name] Abena 04/23/2015, 11:59 AM

## 2015-04-23 NOTE — Significant Event (Signed)
Rapid Response Event Note Called by primary RN to see pt for increased O2 needs.   Overview: Time Called: 0112 Arrival Time: 0114 Event Type: Respiratory  Initial Focused Assessment: On arrival Pt is sitting in bed poorly but states she feels like she is breathing better.  Repositioned pt and she stated that it helped.  Pt in NAD.  BBS diminished with fine wheezes on 50%VM sats 97%. Neb tx given with improvement noted. O2 transitioned to 35%VM.  CXR suggestive of pneumonia.  Discussed pt with L. Harduck, PA & new orders rcvd.  Will cont. To monitor.  Interventions: Repositioned Neb tx Event Summary: Name of Physician Notified: L. Harduck, PA at      at    Outcome: Stayed in room and stabalized     Elena Davia Hedgecock

## 2015-04-23 NOTE — Consult Note (Signed)
Admit date: 04/20/2015 Referring Physician  Dr. Candiss Norse Primary Physician  Dr. Ronnald Collum Primary Cardiologist  Dr. Percival Spanish Reason for Consultation  SOB/CHF  HPI: This is a 80 year old female patient with multiple medical problems including hypertension, COPD, diabetes on insulin, peripheral neuropathy and restless leg syndrome, depression with anxiety, chronic back pain, dyslipidemia, sleep apnea and chronic kidney disease stage IV. Patient presented  to the ER with unrelenting nausea without emesis (one episode dry heave) associated initially with upper abdominal and back pain. After arrival patient primarily complaining of new low back pain that is unrelenting and radiating from the mid back down both hips and down the legs. No similar symptoms in the past. Was given Zofran by EMS with resolution of nausea. CT abdomen and pelvis without acute finding; was noted to have progression of moderate severe multilevel thoracolumbar spine DDD, stable 2.9 cm right adrenal gland nodule, colonic diverticulosis without diverticulitis, and cholelithiasis without evidence of cholecystitis but exam was noncontrasted.  Portable chest x-ray nonacute and unremarkable.  He was diagnosed with acute biliary pancreatitis with lipase 876 and elevated LFTs.  She was also noted to have peripheral edema and a 2D echo was obtained which showed normal LVF with EF 55-60% and normal wall motion and grade 2 diastolic dysfunction.  There was also mild AS and trivial MS and mild MR.  Patient underwent ERCP with sphincterotomy and removal of calculus.  Cardiology is now asked to consult for preoperative cardiac clearance for possible cholecystectomy tomorrow.  Her cardiac risk factors include HTN, DM, dyslipidemia, family history of CAD at age 40 and tobacco use.  Pt admits to chest heaviness when she gets upset and with exertion and goes away quickly when she relaxes.  She has significant DOE per her daughters.  She walks with  walker or on hover chair.  Rarely does she awaken with SOB.  No waking with chest heaviness. Family is helpful with history. No chest pain now.  She also has lower ext edema frequently.    PMH:   Past Medical History  Diagnosis Date  . Hypertension   . COPD (chronic obstructive pulmonary disease) ([place])   . Diabetes mellitus without complication ([place])   . Neuromuscular disorder ([place])   . Depression with anxiety   . Osteopenia   . Arthritis   . GERD (gastroesophageal reflux disease)   . Hyperlipidemia   . Obesity   . Vitamin B12 deficiency   . Chronic kidney disease (CKD), stage IV (severe) ([place])   . Sleep apnea     no cpap use  . Anemia     iron infusion- x2 recent , last 5'16  . Hx of adenomatous colonic polyps 09/2014     PSH:   Past Surgical History  Procedure Laterality Date  . Breast surgery  April 1996    needle biopsy   . Tubal ligation    . Cataract extraction, bilateral Bilateral   . Esophagogastroduodenoscopy N/A 10/19/2014    Procedure: ESOPHAGOGASTRODUODENOSCOPY (EGD);  Surgeon: Gatha Mayer, MD;  Location: [facility];  Service: Endoscopy;  Laterality: N/A;  . Colonoscopy N/A 10/19/2014    Procedure: COLONOSCOPY;  Surgeon: Gatha Mayer, MD;  Location: [facility];  Service: Endoscopy;  Laterality: N/A;  . Ercp N/A 04/22/2015    Procedure: ENDOSCOPIC RETROGRADE CHOLANGIOPANCREATOGRAPHY (ERCP);  Surgeon: Milus Banister, MD;  Location: [facility];  Service: Endoscopy;  Laterality: N/A;    Allergies:  Ace inhibitors and Niaspan Prior to Admit Meds:  Prescriptions prior to admission  Medication Sig Dispense Refill Last Dose  . Alcohol Swabs (PHARMACIST CHOICE ALCOHOL) PADS USE 2 TIMES DAILY FOR DIABETIC TESTING 200 each 2 Taking  . amLODipine (NORVASC) 5 MG tablet Take 1 tablet (5 mg total) by mouth daily. 30 tablet 5 04/19/2015 at Unknown time  . antiseptic oral rinse (BIOTENE) LIQD 15 mLs by Mouth Rinse route as needed for dry mouth.   04/19/2015 at  Unknown time  . aspirin 81 MG tablet Take 81 mg by mouth daily.     04/19/2015 at Unknown time  . B-D UF III MINI PEN NEEDLES 31G X 5 MM MISC USE TWICE A DAY TO INJECT NOVOLOG MIX 70/30 INSULIN 100 each PRN Taking  . budesonide-formoterol (SYMBICORT) 80-4.5 MCG/ACT inhaler Inhale 1 puff into the lungs 2 (two) times daily. 1 Inhaler 5 04/19/2015 at Unknown time  . carvedilol (COREG) 25 MG tablet TAKE 1 TABLET BY MOUTH TWICE DAILY WITH A MEAL 60 tablet 5 04/19/2015 at 1730  . citalopram (CELEXA) 20 MG tablet TAKE 1 TABLET BY MOUTH EVERY DAY 30 tablet 5 04/19/2015 at Unknown time  . cloNIDine (CATAPRES) 0.2 MG tablet TAKE 1 TABLET BY MOUTH TWICE DAILY 60 tablet 5 04/19/2015 at Unknown time  . CRESTOR 20 MG tablet TAKE 1 TABLET BY MOUTH EVERY DAY 30 tablet 2 04/19/2015 at Unknown time  . EASY TOUCH INSULIN SYRINGE 31G X 5/16" 1 ML MISC USE TO INJECT INSULIN TWICE DAILY 100 each 2 Taking  . EASY TOUCH PEN NEEDLES 31G X 8 MM MISC USE TWICE DAILY 100 each 0 Taking  . febuxostat (ULORIC) 40 MG tablet Take 1 tablet (40 mg total) by mouth daily. 30 tablet 3 04/19/2015 at Unknown time  . fenofibrate (TRICOR) 145 MG tablet Take 1 tablet (145 mg total) by mouth daily. 30 tablet 5 04/19/2015 at Unknown time  . Ferrous Sulfate (IRON) 325 (65 FE) MG TABS Take 1 tablet by mouth daily. (Patient taking differently: Take 1 tablet by mouth 2 (two) times daily. ) 30 each 0 04/19/2015 at Unknown time  . furosemide (LASIX) 40 MG tablet TAKE 1 TABLET BY MOUTH EVERY MORNING AND TAKE 1/2 TABLET BY MOUTH AT LUNCH 45 tablet 3 04/19/2015 at Unknown time  . gabapentin (NEURONTIN) 300 MG capsule TAKE 1 CAPSULE BY MOUTH FOUR TIMES A DAY 120 capsule 1 04/19/2015 at Unknown time  . glimepiride (AMARYL) 4 MG tablet TAKE 1/2 TABLET BY MOUTH EVERY DAY BEFORE BREAKFAST (Patient taking differently: Take 2 mg by mouth daily with breakfast. ) 30 tablet 5 04/19/2015 at Unknown time  . Insulin Lispro Prot & Lispro (HUMALOG MIX 75/25 KWIKPEN)  (75-25) 100 UNIT/ML Kwikpen 55 u in am and 65u in pm 12 pen 11 04/19/2015 at Unknown time  . nystatin cream (MYCOSTATIN) APPLY TOPICALLY TWO TIMES DAILY. 30 g 1 04/19/2015 at Unknown time  . omeprazole (PRILOSEC) 40 MG capsule TAKE 1 CAPSULE BY MOUTH EVERY DAY 30 capsule 5 04/19/2015 at Unknown time  . oxyCODONE-acetaminophen (PERCOCET) 10-325 MG tablet Take 1 tablet by mouth 2 (two) times daily. 60 tablet 0 04/19/2015 at Unknown time  . PHARMACIST CHOICE LANCETS MISC USE 2 TIMES DAILY FOR DIABETIC TESTING 200 each 2 Taking  . ULORIC 40 MG tablet TAKE 1 TABLET BY MOUTH ONCE DAILY 30 tablet 5 04/20/2015 at Unknown time  . glucose blood (ACCU-CHEK AVIVA PLUS) test strip Test 4-5 x a day and prn dx E11.9 450 each 12 Taking  . glucose blood (ONETOUCH VERIO)  test strip Test 4x a day and prn  E11.9 300 each 12   . glucose blood test strip Test 4x a day and prn  Dx E11.9 300 each 12 Taking   Fam HX:    Family History  Problem Relation Age of Onset  . Hypertension Mother   . Hypertension Father   . Diabetes Father   . COPD Father   . Diabetes Sister     x 2  . Colon cancer Neg Hx   . Colon polyps Neg Hx   . Skin cancer Sister   . Testicular cancer Brother   . Esophageal cancer Neg Hx    Social HX:    Social History   Social History  . Marital Status: Widowed    Spouse Name: N/A  . Number of Children: 29  . Years of Education: N/A   Occupational History  . Retired    Social History Main Topics  . Smoking status: Former Smoker    Types: Cigarettes    Quit date: 05/01/2010  . Smokeless tobacco: Not on file  . Alcohol Use: No  . Drug Use: No  . Sexual Activity: Not on file   Other Topics Concern  . Not on file   Social History Narrative   Widowed 1 son and 1 daughter   Retired   No caffeine     ROS:  All 11 ROS were addressed and are negative except what is stated in the HPI  Physical Exam: Blood pressure 184/60, pulse 69, temperature 97.5 F (36.4 C), temperature source  Oral, resp. rate 23, height 5\' 4"  (1.626 m), weight 226 lb 3.1 oz (102.6 kg), SpO2 96 %.    General: Well developed, well nourished, in no acute distress Head: Eyes PERRLA, No xanthomas.   Normal cephalic and atramatic  Lungs:   Clear bilaterally to auscultation and percussion. Heart:  muffled HRRR S1 S2 Pulses are 2+ & equal.            No carotid bruit. No JVD.  No abdominal bruits. No femoral bruits. Abdomen:obese,  Bowel sounds are positive, abdomen soft and non-tender without masses Extremities:   No clubbing, cyanosis or edema.  DP +1 Neuro: Alert and oriented X 3. Psych:  Good affect, responds appropriately    Labs:   Lab Results  Component Value Date   WBC 7.7 04/23/2015   HGB 10.6* 04/23/2015   HCT 36.9 04/23/2015   MCV 97.4 04/23/2015   PLT 174 04/23/2015    Recent Labs Lab 04/23/15 0617  NA 145  K 4.5  CL 110  CO2 25  BUN 33*  CREATININE 1.67*  CALCIUM 8.7*  PROT 6.4*  BILITOT 3.6*  ALKPHOS 75  ALT 126*  AST 225*  GLUCOSE 245*   No results found for: PTT No results found for: INR, PROTIME No results found for: CKTOTAL, CKMB, CKMBINDEX, TROPONINI   Lab Results  Component Value Date   CHOL 104 02/26/2015   CHOL 136 11/26/2014   CHOL 126 08/21/2014   Lab Results  Component Value Date   HDL 21* 02/26/2015   HDL 20* 11/26/2014   HDL 17* 08/21/2014   Lab Results  Component Value Date   LDLCALC 27 02/26/2015   LDLCALC 48 11/26/2014   LDLCALC 43 01/08/2014   Lab Results  Component Value Date   TRIG 279* 02/26/2015   TRIG 342* 11/26/2014   TRIG 483* 08/21/2014   Lab Results  Component Value Date   CHOLHDL 5.0* 02/26/2015  CHOLHDL 6.8* 11/26/2014   CHOLHDL 4.0 01/21/2008   No results found for: LDLDIRECT    Radiology:  Dg Chest Port 1 View  04/23/2015  CLINICAL DATA:  80 year old with 3 day history of shortness of breath and nonproductive cough. Current history of hypertension, diabetes, COPD and stage 4 chronic kidney disease. EXAM:  PORTABLE CHEST 1 VIEW COMPARISON:  04/22/2015 and earlier. FINDINGS: Markedly suboptimal inspiration. No significant change in the airspace opacities in the right lung base and right upper lobe. Pulmonary venous hypertension without overt edema, unchanged. Developing airspace consolidation in the medial left lung base. IMPRESSION: Markedly suboptimal inspiration. Stable pneumonia involving the right upper lobe and right lung base. Developing pneumonia at the left lung base. Electronically Signed   By: Evangeline Dakin M.D.   On: 04/23/2015 08:10   Dg Chest Port 1 View  04/23/2015  CLINICAL DATA:  Hypoxia. EXAM: PORTABLE CHEST 1 VIEW COMPARISON:  04/21/2015 FINDINGS: Cardiomediastinal silhouette is enlarged. Mediastinal contours appear intact. The aorta is torturous and contains atherosclerotic calcifications There is no evidence of pleural effusion or pneumothorax. There is opacification of the right lower lobe. More subtle airspace consolidation is also seen in the left cardiophrenic angle. Osseous structures are without acute abnormality. Soft tissues are grossly normal. IMPRESSION: Enlarged cardiac silhouette. Large area of airspace opacification in the right lower lobe, and more subtle opacification in the left lower lobe. Differential diagnosis includes multifocal pneumonia or less likely pulmonary edema. An area of more pronounced increased density in the right mid lung, with which pulmonary mass cannot be excluded. Electronically Signed   By: Fidela Salisbury M.D.   On: 04/23/2015 00:49   Dg Ercp Biliary & Pancreatic Ducts  04/22/2015  CLINICAL DATA:  [place]. EXAM: ERCP TECHNIQUE: Multiple spot images obtained with the fluoroscopic device and submitted for interpretation post-procedure. FLUOROSCOPY TIME:  If the device does not provide the exposure index: Fluoroscopy Time:  2 minutes and 36 seconds Number of Acquired Images:  3 COMPARISON:  MRI 04/21/2015 FINDINGS: Wire was advanced into the  common bile duct. Evidence for a sphincterotomy procedure. Contrast opacification of the biliary system demonstrates a dilated common bile duct. No definite filling defects in the common bile duct. IMPRESSION: Dilated common bile duct. These images were submitted for radiologic interpretation only. Please see the procedural report for the amount of contrast and the fluoroscopy time utilized. Electronically Signed   By: Markus Daft M.D.   On: 04/22/2015 19:32    EKG:  NSR with no ST changes  ASSESSMENT/PLAN:  Principal Problem:   Acute biliary pancreatitis Active Problems:   Diabetes mellitus, insulin dependent (IDDM), uncontrolled (HCC)   Hypertension   Hyperlipidemia   COPD (chronic obstructive pulmonary disease) (HCC)   Diabetic neuropathy (HCC)   RLS (restless legs syndrome)   Peripheral edema   Chronic pain syndrome   Anemia, iron deficiency   Elevated lactic acid level   OSA (obstructive sleep apnea)   CKD (chronic kidney disease), stage IV (HCC)   Calculus of bile duct with obstruction and without cholangitis or cholecystitis   She has multiple cardiac risk factor (morbid obesity, HTN, dyslipidemia with markedly low HDL and high triglycerides, family history of CAD at an early age and tobacco abuse) and also has DOE and chest heaviness with exertion and when she gets upset.  She is high risk from a cardiac standpoint for surgery.  Would recommend hoilding off on surgery unless deemed to be an emergency.  Will make NPO after MN  and  plan for stress myoview - will need 2 day study- will do stress portion tomorrow and if no problems would have enough info to proceed but if abnormal would need to wait until Monday for rest portion.  2D echo showed normal LVF with diastolic dysfunction.   Sueanne Margarita, MD  04/23/2015  2:11 PM

## 2015-04-23 NOTE — Progress Notes (Signed)
Inpatient Diabetes Program Recommendations  AACE/ADA: New Consensus Statement on Inpatient Glycemic Control (2015)  Target Ranges:  Prepandial:   less than 140 mg/dL      Peak postprandial:   less than 180 mg/dL (1-2 hours)      Critically ill patients:  140 - 180 mg/dL   Review of Glycemic Control   Inpatient Diabetes Program Recommendations:  Insulin - Basal: add Lantus or Levemir 15 units  Thank you  Raoul Pitch BSN, RN,CDE Inpatient Diabetes Coordinator [phone] (team pager)

## 2015-04-23 NOTE — Evaluation (Signed)
Physical Therapy Evaluation Patient Details Name: Anna [name] MRN: [id number] DOB: 04-15-1935 Today's Date: 04/23/2015   History of Present Illness  Adm 04/20/15 with N/V and pancreatitis. 12/22 underwent ERCP with diagnosis of Choledocholithiasis. Will need cholecystectomy at some point, however surgery wants her cleared by Cardiology and pulmonology. Pt with episode desaturation and required venturi mask FiO2 50% to maintain sats PMHx-DM, neuropathy, CKD, COPD (without home O2)    Clinical Impression  Pt admitted with above diagnoses. Currently pt is weak and requiring supplemental O2 (2L via [place]). She had a large loss of balance moving from bed to chair with RW requiring mod assist to prevent a fall. Feel pt would benefit from SNF level rehab prior to d/c home (or prior to surgery), however pt is not OK with this plan. If patient cannot be convinced to go to SNF for further therapies, then would maximize [facility] services (including HHPT).Pt currently with functional limitations due to the deficits listed below (see PT Problem List). Pt will benefit from skilled PT to increase their independence and safety with mobility to allow discharge to the venue listed below.       Follow Up Recommendations Home health PT (as above, would benefit from SNF and currently refuses);Supervision for mobility/OOB    Equipment Recommendations  None recommended by PT    Recommendations for Other Services OT consult     Precautions / Restrictions Precautions Precautions: Fall      Mobility  Bed Mobility Overal bed mobility: Needs Assistance Bed Mobility: Supine to Sit     Supine to sit: Supervision;HOB elevated (as at home with adjustable bed)     General bed mobility comments: for safety  Transfers Overall transfer level: Needs assistance Equipment used: Rolling walker (2 wheeled) Transfers: Sit to/from [facility] Sit to Stand: Min assist Stand pivot transfers: Mod assist       General transfer comment: pt with unsafe techniques with RW; during pivot to chair pt with significant loss of balance with PT swinging her hips to land on the chair  Ambulation/Gait Ambulation/Gait assistance: Min assist;Mod assist Ambulation Distance (Feet): 2 Feet Assistive device: Rolling walker (2 wheeled) Gait Pattern/deviations: Step-to pattern     General Gait Details: pt with incr dyspnea and anxiety; room very crowded and limited ambulation to step-to pattern  Stairs            Wheelchair Mobility    Modified Rankin (Stroke Patients Only)       Balance Overall balance assessment: Needs assistance Sitting-balance support: No upper extremity supported;Feet supported Sitting balance-Leahy Scale: Good Sitting balance - Comments: urgently needed to urinate and able to adjust and balance in sitting to use female urinal   Standing balance support: Bilateral upper extremity supported Standing balance-Leahy Scale: Poor Standing balance comment: stood x 2 minutes donning pullup prior to initiating gait/pivot to chair                             Pertinent Vitals/Pain On 2L O2 with SaO2 90% with activity and up to 97% with seated rest. HR 67-69  Pain Assessment: No/denies pain    Home Living Family/patient expects to be discharged to:: Private residence Living Arrangements: Alone Available Help at Discharge: Family;Available 24 hours/day;Personal care attendant (8 children + grandchildren to help care for her) Type of Home: House Home Access: Level entry     Home Layout: One level Home Equipment: Walker - 2 wheels;Electric scooter (  [employer] adjustable bed)      Prior Function Level of Independence: Needs assistance   Gait / Transfers Assistance Needed: walks household distances with RW; uses "hoveround" if she wants to go outside; uses electric cart at grocery store (although children often shop for her)           Hand Dominance         Extremity/Trunk Assessment   Upper Extremity Assessment: Generalized weakness           Lower Extremity Assessment: Generalized weakness      Cervical / Trunk Assessment: Other exceptions  Communication   Communication: No difficulties  Cognition Arousal/Alertness: Awake/alert Behavior During Therapy: WFL for tasks assessed/performed Overall Cognitive Status: Within Functional Limits for tasks assessed                      General Comments General comments (skin integrity, edema, etc.): Multiple family members present. All questions re: PT answered.    Exercises        Assessment/Plan    PT Assessment Patient needs continued PT services  PT Diagnosis Difficulty walking;Generalized weakness   PT Problem List Decreased strength;Decreased activity tolerance;Decreased balance;Decreased mobility;Decreased knowledge of use of DME;Decreased safety awareness;Cardiopulmonary status limiting activity;Obesity  PT Treatment Interventions DME instruction;Gait training;Functional mobility training;Therapeutic activities;Balance training;Therapeutic exercise;Patient/family education   PT Goals (Current goals can be found in the Care Plan section) Acute Rehab PT Goals Patient Stated Goal: return home ASAP PT Goal Formulation: With patient Time For Goal Achievement: 04/30/15 Potential to Achieve Goals: Fair    Frequency Min 3X/week   Barriers to discharge        Co-evaluation               End of Session   Activity Tolerance: Patient limited by fatigue Patient left: in chair;with call bell/phone within reach;with family/visitor present (RN OK with no chair alarm with multiple family members prese) Nurse Communication: Mobility status (encourage as much OOB, walking to BR prior to d/c)         Time: [id number] PT Time Calculation (min) (ACUTE ONLY): 36 min   Charges:   PT Evaluation $Initial PT Evaluation Tier I: 1 Procedure PT Treatments $Therapeutic  Activity: 8-22 mins   PT G Codes:        Brylie Sneath 05-06-15, 4:20 PM Pager [phone]

## 2015-04-23 NOTE — Progress Notes (Signed)
ANTIBIOTIC CONSULT NOTE - INITIAL  Pharmacy Consult for Vancomycin/Zosyn  Indication: rule out pneumonia  Allergies  Allergen Reactions  . Ace Inhibitors Other (See Comments)    Kidney failure  . Niaspan [Niacin Er] Other (See Comments)    flushing    Patient Measurements: Height: 5\' 4"  (162.6 cm) Weight: 230 lb 14.4 oz (104.736 kg) IBW/kg (Calculated) : 54.7  Vital Signs: Temp: 98.1 F (36.7 C) (12/22 2117) Temp Source: Oral (12/22 2117) BP: 176/42 mmHg (12/22 2117) Pulse Rate: 66 (12/23 0136)  Labs:  Recent Labs  04/20/15 0838 04/21/15 0525 04/22/15 1030 04/23/15 0008  WBC 9.4 6.9 7.9  --   HGB 11.2* 10.8* 10.4*  --   PLT 182 154 157  --   CREATININE 2.25* 1.85* 1.54* 1.60*     Medical History: Past Medical History  Diagnosis Date  . Hypertension   . COPD (chronic obstructive pulmonary disease) ([place])   . Diabetes mellitus without complication ([place])   . Neuromuscular disorder ([place])   . Depression with anxiety   . Osteopenia   . Arthritis   . GERD (gastroesophageal reflux disease)   . Hyperlipidemia   . Obesity   . Vitamin B12 deficiency   . Chronic kidney disease (CKD), stage IV (severe) ([place])   . Sleep apnea     no cpap use  . Anemia     iron infusion- x2 recent , last 5'16  . Hx of adenomatous colonic polyps 09/2014    Assessment: 80 y/o F here since 12/20, ERCP on 12/22, having some o2 de-saturation, CXR shows possible PNA, WBC WNL, noted renal dysfunction, other labs reviewed.   Goal of Therapy:  Vancomycin trough level 15-20 mcg/ml  Plan:  -DC Unasyn  -Vancomycin 1250 mg IV q24h -Zosyn 3.375G IV q8h to be infused over 4 hours -Trend WBC, temp, renal function -Drug levels as indicated  Narda Bonds 04/23/2015,1:50 AM

## 2015-04-23 NOTE — Progress Notes (Signed)
Patient ID: Anna [name], female   DOB: 1934/05/23, 80 y.o.   MRN: [id number] 1 Day Post-Op  Subjective: Pt just taken off venti-mask and placed on 5L O2.  Had ERCP yesterday and short of breath today.  Denies any abdominal pain   Objective: Vital signs in last 24 hours: Temp:  [97.7 F (36.5 C)-98.4 F (36.9 C)] 97.7 F (36.5 C) (12/23 0542) Pulse Rate:  [66-76] 76 (12/23 0753) Resp:  [15-26] 22 (12/23 0753) BP: (156-224)/(42-81) 156/81 mmHg (12/23 0542) SpO2:  [92 %-97 %] 94 % (12/23 0753) FiO2 (%):  [50 %] 50 % (12/23 0136) Weight:  [102.6 kg (226 lb 3.1 oz)] 102.6 kg (226 lb 3.1 oz) (12/23 0542) Last BM Date: 04/20/15  Intake/Output from previous day: 12/22 0701 - 12/23 0700 In: 700 [I.V.:400; IV Piggyback:300] Out: 750 [Urine:750] Intake/Output this shift: Total I/O In: 449.3 [P.O.:220; I.V.:229.3] Out: 320 [Urine:320]  PE: Abd: soft, NT, ND, obese, +BS Heart: regular Lungs: diffuse rhonci noted through, O2 in place on 5L  Lab Results:   Recent Labs  04/22/15 1030 04/23/15 0617  WBC 7.9 7.7  HGB 10.4* 10.6*  HCT 34.1* 36.9  PLT 157 174   BMET  Recent Labs  04/23/15 0008 04/23/15 0617  NA 144 145  K 4.2 4.5  CL 111 110  CO2 23 25  GLUCOSE 233* 245*  BUN 34* 33*  CREATININE 1.60* 1.67*  CALCIUM 8.4* 8.7*   PT/INR No results for input(s): LABPROT, INR in the last 72 hours. CMP     Component Value Date/Time   NA 145 04/23/2015 0617   NA 141 02/26/2015 1049   K 4.5 04/23/2015 0617   CL 110 04/23/2015 0617   CO2 25 04/23/2015 0617   GLUCOSE 245* 04/23/2015 0617   GLUCOSE 228* 02/26/2015 1049   BUN 33* 04/23/2015 0617   BUN 50* 02/26/2015 1049   CREATININE 1.67* 04/23/2015 0617   CREATININE 2.11* 11/13/2012 1122   CALCIUM 8.7* 04/23/2015 0617   PROT 6.4* 04/23/2015 0617   PROT 6.3 02/26/2015 1049   ALBUMIN 2.5* 04/23/2015 0617   ALBUMIN 3.7 02/26/2015 1049   AST 225* 04/23/2015 0617   ALT 126* 04/23/2015 0617   ALKPHOS 75 04/23/2015  0617   BILITOT 3.6* 04/23/2015 0617   BILITOT 0.6 02/26/2015 1049   GFRNONAA 28* 04/23/2015 0617   GFRNONAA 22* 11/13/2012 1122   GFRAA 33* 04/23/2015 0617   GFRAA 26* 11/13/2012 1122   Lipase     Component Value Date/Time   LIPASE 65* 04/21/2015 0525       Studies/Results: Dg Chest Port 1 View  04/23/2015  CLINICAL DATA:  80 year old with 3 day history of shortness of breath and nonproductive cough. Current history of hypertension, diabetes, COPD and stage 4 chronic kidney disease. EXAM: PORTABLE CHEST 1 VIEW COMPARISON:  04/22/2015 and earlier. FINDINGS: Markedly suboptimal inspiration. No significant change in the airspace opacities in the right lung base and right upper lobe. Pulmonary venous hypertension without overt edema, unchanged. Developing airspace consolidation in the medial left lung base. IMPRESSION: Markedly suboptimal inspiration. Stable pneumonia involving the right upper lobe and right lung base. Developing pneumonia at the left lung base. Electronically Signed   By: Evangeline Dakin M.D.   On: 04/23/2015 08:10   Dg Chest Port 1 View  04/23/2015  CLINICAL DATA:  Hypoxia. EXAM: PORTABLE CHEST 1 VIEW COMPARISON:  04/21/2015 FINDINGS: Cardiomediastinal silhouette is enlarged. Mediastinal contours appear intact. The aorta is torturous and contains atherosclerotic calcifications There  is no evidence of pleural effusion or pneumothorax. There is opacification of the right lower lobe. More subtle airspace consolidation is also seen in the left cardiophrenic angle. Osseous structures are without acute abnormality. Soft tissues are grossly normal. IMPRESSION: Enlarged cardiac silhouette. Large area of airspace opacification in the right lower lobe, and more subtle opacification in the left lower lobe. Differential diagnosis includes multifocal pneumonia or less likely pulmonary edema. An area of more pronounced increased density in the right mid lung, with which pulmonary mass  cannot be excluded. Electronically Signed   By: Fidela Salisbury M.D.   On: 04/23/2015 00:49   Dg Ercp Biliary & Pancreatic Ducts  04/22/2015  CLINICAL DATA:  Gallstone. EXAM: ERCP TECHNIQUE: Multiple spot images obtained with the fluoroscopic device and submitted for interpretation post-procedure. FLUOROSCOPY TIME:  If the device does not provide the exposure index: Fluoroscopy Time:  2 minutes and 36 seconds Number of Acquired Images:  3 COMPARISON:  MRI 04/21/2015 FINDINGS: Wire was advanced into the common bile duct. Evidence for a sphincterotomy procedure. Contrast opacification of the biliary system demonstrates a dilated common bile duct. No definite filling defects in the common bile duct. IMPRESSION: Dilated common bile duct. These images were submitted for radiologic interpretation only. Please see the procedural report for the amount of contrast and the fluoroscopy time utilized. Electronically Signed   By: Markus Daft M.D.   On: 04/22/2015 19:32    Anti-infectives: Anti-infectives    Start     Dose/Rate Route Frequency Ordered Stop   04/23/15 0200  vancomycin (VANCOCIN) 1,250 mg in sodium chloride 0.9 % 250 mL IVPB     1,250 mg 166.7 mL/hr over 90 Minutes Intravenous Every 24 hours 04/23/15 0156     04/23/15 0200  piperacillin-tazobactam (ZOSYN) IVPB 3.375 g     3.375 g 12.5 mL/hr over 240 Minutes Intravenous 3 times per day 04/23/15 0156     04/21/15 1600  ampicillin-sulbactam (UNASYN) 1.5 g in sodium chloride 0.9 % 50 mL IVPB  Status:  Discontinued     1.5 g 100 mL/hr over 30 Minutes Intravenous Every 8 hours 04/21/15 1511 04/23/15 0156   04/21/15 1515  Ampicillin-Sulbactam (UNASYN) 3 g in sodium chloride 0.9 % 100 mL IVPB  Status:  Discontinued     3 g 100 mL/hr over 60 Minutes Intravenous  Once 04/21/15 1501 04/21/15 1502       Assessment/Plan  Gallstone pancreatitis Choledocholithiasis s/p ERCP with sphincterotomy - Dr. Ardis Hughs -small nodule biopsied as well and sent  for pathology, but likely benign -patient having increasing SOB today, likely some fluid overload and bilateral PNA.  She is currently on 5L of O2 and just weaned off a venti-mask.  She has a hx of COPD as well. -the patient looks chronically ill.  Given her current state, we would recommend NOT proceeding with a lap chole this admission and getting her medically better and planning a lap chole as an outpatient.  She does not have cholecystitis, which makes her operation, not emergent. -she needs cards clearance, but she would benefit from pulmonary clearance as well as she does not have a pulmonologist who follows her. -no abx are needed for her gallbladder -diet can be advanced as tolerated from our standpoint -relayed this to primary MD.   -This was also the request of the patient's family and patient as well.  They were actually the one's who asked if we could delay surgery and let her medically improve prior to surgery.  I explained the biggest risk to her with that may be recurrent pancreatitis and the complications that can come from that.  They understood and still thought that may be her best option, to which I agree. COPD OSA AODM ID Peripheral neuropathy with limited mobility (wheelchair and walker in house) Deconditioning  Body mass index is 39.5 DDD Chronic kidney disease Hx of depression/anxiety Dyslipidemia Antibiotics: Zosyn/Vanc day 1 for HCAP  LOS: 3 days    Suhaylah Wampole E 04/23/2015, 1:41 PM Pager: [phone]

## 2015-04-23 NOTE — Progress Notes (Addendum)
Per ABG patient placed on 50% venturi mask. Patient tolerating well at this time. SPO2 increased from 88% on 4 LPM to 94%. Family at bedside. RT will continue to monitor RN aware of results and oxygen changes.

## 2015-04-23 NOTE — Care Management Note (Signed)
Case Management Note  Patient Details  Name: Anna [name] MRN: [id number] Date of Birth: Apr 05, 1935  Subjective/Objective:  Patient is from home alone, she is for lap chole tomorrow, on iv lasix.  NCM will cont to follow for dc needs.                Action/Plan:   Expected Discharge Date:                  Expected Discharge Plan:  [place]  In-House Referral:     Discharge planning Services  CM Consult  Post Acute Care Choice:    Choice offered to:     DME Arranged:    DME Agency:     HH Arranged:    [place] Agency:     Status of Service:  In process, will continue to follow  Medicare Important Message Given:  Yes Date Medicare IM Given:    Medicare IM give by:    Date Additional Medicare IM Given:    Additional Medicare Important Message give by:     If discussed at [place] of Stay Meetings, dates discussed:    Additional Comments:  Zenon Mayo, RN 04/23/2015, 1:22 PM

## 2015-04-23 NOTE — Progress Notes (Signed)
Pt O2 level was dropping to 87 and goes up to 90  with 4l of O2 on and was having expiratory wheezing and sound like something in her throat , i encouraged her to cough out but nothing came out, tried to saction out but nothing came out  MD on call was paged, ordered for chest x-ray and decrease her iv fluid rate to 72ml/hr, pt is on continues pulsat, will continue to monitor, the nurse that took over from me at 11pm was made aware of pt condition right now

## 2015-04-23 NOTE — Progress Notes (Signed)
[facility] Progress Note  Subjective: S/P ERCP 04/22/15:ENDOSCOPIC IMPRESSION: Choledocholithiasis, treated with biliary sphincterotomy and balloon sweeping. Also small soft tissue nodule at the major papilla was cauterized free during biliary sphinctertomy and retrieved to be sent to pathology. RECOMMENDATIONS: Follow clinically. Lap chole timing per general surgery. Await final path from the distal CBD nodule. Had some SOB last pm, felt to be pulm edema. Lasix ordered by hospitalist.CXR with stable RUL pneumonia and developing pneumonia left base. T bili 3.6, AST 225, ALT 126. No abd pain, N/V. Objective:  Vital signs in last 24 hours: Temp:  [97.7 F (36.5 C)-98.6 F (37 C)] 97.7 F (36.5 C) (12/23 0542) Pulse Rate:  [66-76] 76 (12/23 0753) Resp:  [15-26] 22 (12/23 0753) BP: (156-224)/(42-83) 156/81 mmHg (12/23 0542) SpO2:  [92 %-97 %] 94 % (12/23 0753) FiO2 (%):  [50 %] 50 % (12/23 0136) Weight:  [226 lb 3.1 oz (102.6 kg)] 226 lb 3.1 oz (102.6 kg) (12/23 0542) Last BM Date: 04/20/15 General:   Alert,  Well-developed,    in NAD Heart:  Regular rate and rhythm; no murmurs Pulm;scattered wheezes, crackles at base Abdomen:  Soft, nontender and nondistended. Normal bowel sounds, without guarding, and without rebound.   Neurologic  Alert and  oriented x4;  grossly normal neurologically. Psych:  Alert and cooperative. Normal mood and affect.  Intake/Output from previous day: 12/22 0701 - 12/23 0700 In: 700 [I.V.:400; IV Piggyback:300] Out: 750 [Urine:750] Intake/Output this shift: Total I/O In: 60 [P.O.:60] Out: -   Lab Results:  Recent Labs  04/21/15 0525 04/22/15 1030 04/23/15 0617  WBC 6.9 7.9 7.7  HGB 10.8* 10.4* 10.6*  HCT 34.9* 34.1* 36.9  PLT 154 157 174   BMET  Recent Labs  04/22/15 1030 04/23/15 0008 04/23/15 0617  NA 144 144 145  K 4.5 4.2 4.5  CL 111 111 110  CO2 23 23 25   GLUCOSE 231* 233* 245*  BUN 30* 34* 33*  CREATININE  1.54* 1.60* 1.67*  CALCIUM 8.6* 8.4* 8.7*   LFT  Recent Labs  04/23/15 0617  PROT 6.4*  ALBUMIN 2.5*  AST 225*  ALT 126*  ALKPHOS 75  BILITOT 3.6*    Mr Abdomen Mrcp Wo Cm  04/21/2015  CLINICAL DATA:  Abdominal pain.  Nausea. EXAM: MRI ABDOMEN WITHOUT CONTRAST  (INCLUDING MRCP) TECHNIQUE: Multiplanar multisequence MR imaging of the abdomen was performed. Heavily T2-weighted images of the biliary and pancreatic ducts were obtained, and three-dimensional MRCP images were rendered by post processing. COMPARISON:  04/20/2015 and MR from 01/21/2008 FINDINGS: Lower chest: Small pleural effusions and right lower lobe airspace consolidation noted. Hepatobiliary: There is hepatic steatosis noted. No focal liver abnormality identified. Stones noted within the gallbladder. These measure up to 1 cm. There is mild intrahepatic bile duct dilatation. Within the distal common bile duct there is a stone measuring 9 x 6 mm, image 27 of series 4. Pancreas: Negative. Spleen: The spleen is borderline enlarged measuring 13 cm in length. Adrenals/Urinary Tract: Susceptibility artifact is identified within the right adrenal nodule compatible with hemorrhage. Stomach/Bowel: The stomach appears normal. No abnormal dilatation of the visualized upper abdominal bowel loops. Vascular/Lymphatic: Normal appearance of the abdominal aorta. No enlarged upper abdominal lymph nodes. Other: No free fluid or fluid collections identified. Musculoskeletal: Normal signal from within the bone marrow. IMPRESSION: 1. Gallstones. The common bile duct is increased in caliber and there is a stone within the distal CBD. 2. Hepatic steatosis 3. Right adrenal nodule.  Susceptibility artifact noted suggesting previous hemorrhage. This does not appear significantly changed from 01/21/2008 compatible with a benign process. Electronically Signed   By: Kerby Moors M.D.   On: 04/21/2015 14:54   Mr 3d Recon At Scanner  04/21/2015  CLINICAL DATA:   Abdominal pain.  Nausea. EXAM: MRI ABDOMEN WITHOUT CONTRAST  (INCLUDING MRCP) TECHNIQUE: Multiplanar multisequence MR imaging of the abdomen was performed. Heavily T2-weighted images of the biliary and pancreatic ducts were obtained, and three-dimensional MRCP images were rendered by post processing. COMPARISON:  04/20/2015 and MR from 01/21/2008 FINDINGS: Lower chest: Small pleural effusions and right lower lobe airspace consolidation noted. Hepatobiliary: There is hepatic steatosis noted. No focal liver abnormality identified. Stones noted within the gallbladder. These measure up to 1 cm. There is mild intrahepatic bile duct dilatation. Within the distal common bile duct there is a stone measuring 9 x 6 mm, image 27 of series 4. Pancreas: Negative. Spleen: The spleen is borderline enlarged measuring 13 cm in length. Adrenals/Urinary Tract: Susceptibility artifact is identified within the right adrenal nodule compatible with hemorrhage. Stomach/Bowel: The stomach appears normal. No abnormal dilatation of the visualized upper abdominal bowel loops. Vascular/Lymphatic: Normal appearance of the abdominal aorta. No enlarged upper abdominal lymph nodes. Other: No free fluid or fluid collections identified. Musculoskeletal: Normal signal from within the bone marrow. IMPRESSION: 1. Gallstones. The common bile duct is increased in caliber and there is a stone within the distal CBD. 2. Hepatic steatosis 3. Right adrenal nodule. Susceptibility artifact noted suggesting previous hemorrhage. This does not appear significantly changed from 01/21/2008 compatible with a benign process. Electronically Signed   By: Kerby Moors M.D.   On: 04/21/2015 14:54   Dg Chest Port 1 View  04/23/2015  CLINICAL DATA:  80 year old with 3 day history of shortness of breath and nonproductive cough. Current history of hypertension, diabetes, COPD and stage 4 chronic kidney disease. EXAM: PORTABLE CHEST 1 VIEW COMPARISON:  04/22/2015 and  earlier. FINDINGS: Markedly suboptimal inspiration. No significant change in the airspace opacities in the right lung base and right upper lobe. Pulmonary venous hypertension without overt edema, unchanged. Developing airspace consolidation in the medial left lung base. IMPRESSION: Markedly suboptimal inspiration. Stable pneumonia involving the right upper lobe and right lung base. Developing pneumonia at the left lung base. Electronically Signed   By: Evangeline Dakin M.D.   On: 04/23/2015 08:10   Dg Chest Port 1 View  04/23/2015  CLINICAL DATA:  Hypoxia. EXAM: PORTABLE CHEST 1 VIEW COMPARISON:  04/21/2015 FINDINGS: Cardiomediastinal silhouette is enlarged. Mediastinal contours appear intact. The aorta is torturous and contains atherosclerotic calcifications There is no evidence of pleural effusion or pneumothorax. There is opacification of the right lower lobe. More subtle airspace consolidation is also seen in the left cardiophrenic angle. Osseous structures are without acute abnormality. Soft tissues are grossly normal. IMPRESSION: Enlarged cardiac silhouette. Large area of airspace opacification in the right lower lobe, and more subtle opacification in the left lower lobe. Differential diagnosis includes multifocal pneumonia or less likely pulmonary edema. An area of more pronounced increased density in the right mid lung, with which pulmonary mass cannot be excluded. Electronically Signed   By: Fidela Salisbury M.D.   On: 04/23/2015 00:49   Dg Ercp Biliary & Pancreatic Ducts  04/22/2015  CLINICAL DATA:  Gallstone. EXAM: ERCP TECHNIQUE: Multiple spot images obtained with the fluoroscopic device and submitted for interpretation post-procedure. FLUOROSCOPY TIME:  If the device does not provide the exposure index: Fluoroscopy Time:  2 minutes and 36 seconds Number of Acquired Images:  3 COMPARISON:  MRI 04/21/2015 FINDINGS: Wire was advanced into the common bile duct. Evidence for a sphincterotomy  procedure. Contrast opacification of the biliary system demonstrates a dilated common bile duct. No definite filling defects in the common bile duct. IMPRESSION: Dilated common bile duct. These images were submitted for radiologic interpretation only. Please see the procedural report for the amount of contrast and the fluoroscopy time utilized. Electronically Signed   By: Markus Daft M.D.   On: 04/22/2015 19:32    ASSESSMENT/PLAN:  80 yo female with gallstone pancreatitis, s/p ERCP 12/22. No abd pin, N/V today. Has some dyspnea, BNP elevated, lasix ordered. CXR with pneumonia. For  Perfusion scan today. Continue supportice care per hospitalists. Will trend LFTs.  Principal Problem:   Acute biliary pancreatitis Active Problems:   Diabetes mellitus, insulin dependent (IDDM), uncontrolled (HCC)   Hypertension   Hyperlipidemia   COPD (chronic obstructive pulmonary disease) (HCC)   Diabetic neuropathy (HCC)   RLS (restless legs syndrome)   Peripheral edema   Chronic pain syndrome   Anemia, iron deficiency   Elevated lactic acid level   OSA (obstructive sleep apnea)   CKD (chronic kidney disease), stage IV ([place])     LOS: 3 days   Hvozdovic, Vita Barley PA-C 04/23/2015, Pager [phone] Mon-Fri 8a-5p [phone] after 5p, weekends, holidays   Attending physician's note   I have taken an interval history, reviewed the chart and examined the patient. I agree with the Advanced Practitioner's note, impression and recommendations.  CHF exacerbation improving with IV lasix. No abdominal pain. S/p ERCP with stone extraction yesterday. Cholecystectomy timing as per surgery. Continue to follow LFT to make sure down trending.  Please call with any questions  K Denzil Magnuson, MD [phone] Mon-Fri 8a-5p [phone] after 5p, weekends, holidays

## 2015-04-24 ENCOUNTER — Inpatient Hospital Stay (HOSPITAL_COMMUNITY): Payer: Medicare Other

## 2015-04-24 LAB — CBC
HCT: 35.9 % — ABNORMAL LOW (ref 36.0–46.0)
Hemoglobin: 10.7 g/dL — ABNORMAL LOW (ref 12.0–15.0)
MCH: 28.2 pg (ref 26.0–34.0)
MCHC: 29.8 g/dL — ABNORMAL LOW (ref 30.0–36.0)
MCV: 94.5 fL (ref 78.0–100.0)
PLATELETS: 190 10*3/uL (ref 150–400)
RBC: 3.8 MIL/uL — ABNORMAL LOW (ref 3.87–5.11)
RDW: 18.9 % — AB (ref 11.5–15.5)
WBC: 7.2 10*3/uL (ref 4.0–10.5)

## 2015-04-24 LAB — NM MYOCAR SINGLE W/SPECT
CHL CUP MPHR: 142 {beats}/min
CSEPEW: 1 METS
CSEPHR: 59 %
CSEPPHR: 85 {beats}/min
Exercise duration (min): 0 min
Exercise duration (sec): 0 s
Rest HR: 71 {beats}/min

## 2015-04-24 LAB — BASIC METABOLIC PANEL
Anion gap: 11 (ref 5–15)
BUN: 48 mg/dL — AB (ref 6–20)
CO2: 24 mmol/L (ref 22–32)
CREATININE: 1.79 mg/dL — AB (ref 0.44–1.00)
Calcium: 9 mg/dL (ref 8.9–10.3)
Chloride: 106 mmol/L (ref 101–111)
GFR, EST AFRICAN AMERICAN: 30 mL/min — AB (ref >60)
GFR, EST NON AFRICAN AMERICAN: 26 mL/min — AB (ref >60)
Glucose, Bld: 310 mg/dL — ABNORMAL HIGH (ref 65–99)
POTASSIUM: 3.9 mmol/L (ref 3.5–5.1)
SODIUM: 141 mmol/L (ref 135–145)

## 2015-04-24 LAB — GLUCOSE, CAPILLARY
GLUCOSE-CAPILLARY: 214 mg/dL — AB (ref 65–99)
GLUCOSE-CAPILLARY: 234 mg/dL — AB (ref 65–99)
Glucose-Capillary: 152 mg/dL — ABNORMAL HIGH (ref 65–99)
Glucose-Capillary: 226 mg/dL — ABNORMAL HIGH (ref 65–99)
Glucose-Capillary: 303 mg/dL — ABNORMAL HIGH (ref 65–99)
Glucose-Capillary: 449 mg/dL — ABNORMAL HIGH (ref 65–99)

## 2015-04-24 LAB — POTASSIUM: Potassium: 4.5 mmol/L (ref 3.5–5.1)

## 2015-04-24 LAB — GLUCOSE, RANDOM: GLUCOSE: 335 mg/dL — AB (ref 65–99)

## 2015-04-24 LAB — MAGNESIUM: Magnesium: 2.1 mg/dL (ref 1.7–2.4)

## 2015-04-24 MED ORDER — INSULIN ASPART 100 UNIT/ML ~~LOC~~ SOLN: 0.0000 [IU] | SUBCUTANEOUS | Status: DC

## 2015-04-24 MED ORDER — TECHNETIUM TC 99M SESTAMIBI GENERIC - CARDIOLITE
30.0000 | Freq: Once | INTRAVENOUS | Status: AC | PRN
  Administered 2015-04-24: 30 via INTRAVENOUS

## 2015-04-24 MED ORDER — INSULIN GLARGINE 100 UNIT/ML ~~LOC~~ SOLN
30.0000 [IU] | Freq: Every day | SUBCUTANEOUS | Status: DC
  Administered 2015-04-24 – 2015-04-25 (×2): 30 [IU] via SUBCUTANEOUS
  Filled 2015-04-24 (×2): qty 0.3

## 2015-04-24 MED ORDER — INSULIN ASPART 100 UNIT/ML ~~LOC~~ SOLN
10.0000 [IU] | Freq: Once | SUBCUTANEOUS | Status: AC
  Administered 2015-04-24: 10 [IU] via SUBCUTANEOUS

## 2015-04-24 MED ORDER — REGADENOSON 0.4 MG/5ML IV SOLN
0.4000 mg | Freq: Once | INTRAVENOUS | Status: AC
  Administered 2015-04-24: 0.4 mg via INTRAVENOUS
  Filled 2015-04-24: qty 5

## 2015-04-24 MED ORDER — REGADENOSON 0.4 MG/5ML IV SOLN
INTRAVENOUS | Status: AC
  Administered 2015-04-24: 11:00:00
  Filled 2015-04-24: qty 5

## 2015-04-24 MED ORDER — INSULIN ASPART 100 UNIT/ML ~~LOC~~ SOLN: 0.0000 [IU] | Freq: Every day | SUBCUTANEOUS | Status: DC

## 2015-04-24 MED ORDER — INSULIN ASPART 100 UNIT/ML ~~LOC~~ SOLN
0.0000 [IU] | Freq: Three times a day (TID) | SUBCUTANEOUS | Status: DC
  Administered 2015-04-24: 11 [IU] via SUBCUTANEOUS
  Administered 2015-04-24: 5 [IU] via SUBCUTANEOUS
  Administered 2015-04-25: 3 [IU] via SUBCUTANEOUS

## 2015-04-24 MED ORDER — INSULIN ASPART 100 UNIT/ML ~~LOC~~ SOLN: 0.0000 [IU] | Freq: Three times a day (TID) | SUBCUTANEOUS | Status: DC

## 2015-04-24 NOTE — Progress Notes (Signed)
Part 1 (stress portion) of 2 day Lexiscan stress test completed without significant complication.  She has elevated d-dimer, not good candidate for CTA of chest given renal insufficiency, consider V/Q scan, however V/Q has to be delayed for 48 hours after stress test. If resting portion is tomorrow, then V/Q scan has to be Tue. Will ask radiology to read stress only portion today, if normal, then no need to do resting tomorrow. However if stress only portion is abnormal, then will need resting portion tomorrow to make it two days.  Hilbert Corrigan PA Pager: [phone]

## 2015-04-24 NOTE — Progress Notes (Signed)
PATIENT DETAILS Name: Anna [name] Age: 80 y.o. Sex: female Date of Birth: 01/29/1935 Admit Date: 04/20/2015 Admitting Physician Waldemar Dickens, MD MD:[id number] MARGARET, FNP  Subjective:  Patient in bed, sitting up, developed shortness of breath last night but much improved now. No chest pain. Dull right upper quadrant/the gastric abdominal pain. No focal weakness.  Assessment/Plan:  Acute biliary pancreatitis: Improved, abdomen is soft and nontender, start clear liquids. MRCP positive for choledocholithiasis, ERCP today, general surgery contemplating cholecystectomy on 04/24/2015, stable ERCP. Cards requested to evaluate preop. For now family in Gen. surgery have decided to do cholecystectomy in the outpatient setting.   Acute on chronic diastolic CHF with acute hypoxic respiratory failure on chronic - night of 04/22/2015 -  became acutely short of breath, chest x-ray shows bilateral infiltrates clinically suggestive of fluid overload, she clearly has orthopnea and edema along with rales on exam, BNP was elevated. She feels much better after she received Lasix and Nitropaste also on Coreg which will be continued. Echo shows an EF preserved at 60% without any wall motion abnormality. Cardiology on board.  Her d-dimer was elevated but this was in the setting of acute pancreatitis. I do not think she has a PE. There was no RV strain on echogram, she had features of CHF as above. We will check lower extremity venous duplex if negative I doubt any further testing is indicated. Patient is chest pain-free and now shortness of breath has resolved with Lasix.  Per cardiology patient undergoing Myoview.   Choledocholithiasis: Now due for outpatient laparoscopic cholecystectomy by general surgery.    Acute on chronic kidney disease stage IV: ARF secondary to previous azotemia due to pancreatitis. Currently resolved and back to baseline.   Hypertension:  better  controlled today-continue amlodipine, clonidine, Coreg at current doses--we will reassess tomorrow and adjust doses of medications accordingly.   Dyslipidemia: Resume antilipid treatment once LFTs have normalized.   GERD: Continue PPI   Peripheral neuropathy: Continue Neurontin    Anemia: Likely multifactorial from CKD and iron deficiency anemia (per PCP note has history of iron deficiency) hemoglobin currently stable and close to usual baseline    COPD: lungs clear on exam-continue Symbicort, add as needed albuterol.   Depression with anxiety: Appears stable, continue with Celexa   Insulin-dependent type 2 diabetes: CBGs currently stable with SSI and Lantus 10 units twice a day to be held if she is nothing by mouth or CBG below 120, hold Amaryl as she is going to be going for surgery on 04/24/2015.   Lab Results  Component Value Date   HGBA1C 6.3* 04/20/2015    CBG (last 3)   Recent Labs  04/24/15 0011 04/24/15 0420 04/24/15 0802  GLUCAP 449* 226* 214*      Disposition: Remain inpatient-home of the next 2-3 days  Antimicrobial agents  See below  Anti-infectives    Start     Dose/Rate Route Frequency Ordered Stop   04/23/15 0200  vancomycin (VANCOCIN) 1,250 mg in sodium chloride 0.9 % 250 mL IVPB     1,250 mg 166.7 mL/hr over 90 Minutes Intravenous Every 24 hours 04/23/15 0156     04/23/15 0200  piperacillin-tazobactam (ZOSYN) IVPB 3.375 g     3.375 g 12.5 mL/hr over 240 Minutes Intravenous 3 times per day 04/23/15 0156     04/21/15 1600  ampicillin-sulbactam (UNASYN) 1.5 g in sodium chloride 0.9 %  50 mL IVPB  Status:  Discontinued     1.5 g 100 mL/hr over 30 Minutes Intravenous Every 8 hours 04/21/15 1511 04/23/15 0156   04/21/15 1515  Ampicillin-Sulbactam (UNASYN) 3 g in sodium chloride 0.9 % 100 mL IVPB  Status:  Discontinued     3 g 100 mL/hr over 60 Minutes Intravenous  Once 04/21/15 1501 04/21/15 1502      DVT Prophylaxis: Prophylactic Lovenox    Code Status: Full code   Family Communication Son at bedside  Procedures:  TTE    CONSULTS:  GI, CCS, cards  Time spent 30 minutes-Greater than 50% of this time was spent in counseling, explanation of diagnosis, planning of further management, and coordination of care.  MEDICATIONS: Scheduled Meds: . amLODipine  5 mg Oral Daily  . budesonide-formoterol  1 puff Inhalation BID  . carvedilol  12.5 mg Oral BID WC  . citalopram  20 mg Oral Daily  . cloNIDine  0.2 mg Oral BID  . furosemide  20 mg Intravenous Daily  . gabapentin  300 mg Oral QID  . insulin aspart  0-15 Units Subcutaneous TID WC  . insulin aspart  0-5 Units Subcutaneous QHS  . insulin glargine  30 Units Subcutaneous Daily  . nitroGLYCERIN  0.5 inch Topical 4 times per day  . pantoprazole  40 mg Oral Daily  . piperacillin-tazobactam (ZOSYN)  IV  3.375 g Intravenous 3 times per day  . sodium chloride  3 mL Intravenous Q12H  . vancomycin  1,250 mg Intravenous Q24H   Continuous Infusions:   PRN Meds:.acetaminophen **OR** [DISCONTINUED] acetaminophen, albuterol, hydrALAZINE, metoprolol, morphine injection, ondansetron (ZOFRAN) IV    PHYSICAL EXAM: Vital signs in last 24 hours: Filed Vitals:   04/24/15 0941 04/24/15 0944 04/24/15 0946 04/24/15 0948  BP: 162/36 154/46 158/52 156/49  Pulse: 83 82 82 80  Temp:      TempSrc:      Resp:      Height:      Weight:      SpO2:        Weight change: 0.3 kg (10.6 oz) Filed Weights   04/22/15 0606 04/23/15 0542 04/24/15 0543  Weight: 104.736 kg (230 lb 14.4 oz) 102.6 kg (226 lb 3.1 oz) 102.9 kg (226 lb 13.7 oz)   Body mass index is 38.92 kg/(m^2).   Gen Exam: Awake and alert with clear speech.   Neck: Supple, No JVD.   Chest: B/L Clear.  Bibasilar rales  CVS: S1 S2 Regular, no murmurs.  Abdomen: soft, BS +, non tender, non distended.  Extremities: no edema, lower extremities warm to touch. Neurologic: Non Focal.   Skin: No Rash.   Wounds: N/A.    Intake/Output from previous day:  Intake/Output Summary (Last 24 hours) at 04/24/15 1154 Last data filed at 04/24/15 0847  Gross per 24 hour  Intake   1022 ml  Output    320 ml  Net    702 ml     LAB RESULTS: CBC  Recent Labs Lab 04/20/15 0838 04/21/15 0525 04/22/15 1030 04/23/15 0617  WBC 9.4 6.9 7.9 7.7  HGB 11.2* 10.8* 10.4* 10.6*  HCT 37.7 34.9* 34.1* 36.9  PLT 182 154 157 174  MCV 95.2 95.4 96.6 97.4  MCH 28.3 29.5 29.5 28.0  MCHC 29.7* 30.9 30.5 28.7*  RDW 19.3* 19.3* 19.2* 19.0*    Chemistries   Recent Labs Lab 04/20/15 0838 04/21/15 0525 04/22/15 1030 04/23/15 0008 04/23/15 0617 04/24/15 0126  NA 140 140  144 144 145  --   K 5.0 4.3 4.5 4.2 4.5 4.5  CL 102 106 111 111 110  --   CO2 25 26 23 23 25   --   GLUCOSE 264* 227* 231* 233* 245* 335*  BUN 57* 40* 30* 34* 33*  --   CREATININE 2.25* 1.85* 1.54* 1.60* 1.67*  --   CALCIUM 9.5 8.7* 8.6* 8.4* 8.7*  --   MG  --   --   --   --   --  2.1    CBG:  Recent Labs Lab 04/23/15 1519 04/23/15 1950 04/24/15 0011 04/24/15 0420 04/24/15 0802  GLUCAP 328* 365* 449* 226* 214*    GFR Estimated Creatinine Clearance: 32.4 mL/min (by C-G formula based on Cr of 1.67).  Coagulation profile No results for input(s): INR, PROTIME in the last 168 hours.  Cardiac Enzymes No results for input(s): CKMB, TROPONINI, MYOGLOBIN in the last 168 hours.  Invalid input(s): CK  Invalid input(s): POCBNP  Recent Labs  04/23/15 0008  DDIMER 2.52*   No results for input(s): HGBA1C in the last 72 hours. No results for input(s): CHOL, HDL, LDLCALC, TRIG, CHOLHDL, LDLDIRECT in the last 72 hours. No results for input(s): TSH, T4TOTAL, T3FREE, THYROIDAB in the last 72 hours.  Invalid input(s): FREET3 No results for input(s): VITAMINB12, FOLATE, FERRITIN, TIBC, IRON, RETICCTPCT in the last 72 hours. No results for input(s): LIPASE, AMYLASE in the last 72 hours.  Urine Studies No results for input(s): UHGB, CRYS in  the last 72 hours.  Invalid input(s): UACOL, UAPR, USPG, UPH, UTP, UGL, UKET, UBIL, UNIT, UROB, ULEU, UEPI, UWBC, URBC, UBAC, CAST, UCOM, BILUA  MICROBIOLOGY: Recent Results (from the past 240 hour(s))  Blood Culture (routine x 2)     Status: None (Preliminary result)   Collection Time: 04/20/15  8:50 AM  Result Value Ref Range Status   Specimen Description BLOOD RIGHT ANTECUBITAL  Final   Special Requests BOTTLES DRAWN AEROBIC AND ANAEROBIC 10MLS  Final   Culture NO GROWTH 3 DAYS  Final   Report Status PENDING  Incomplete  Blood Culture (routine x 2)     Status: None (Preliminary result)   Collection Time: 04/20/15  9:05 AM  Result Value Ref Range Status   Specimen Description BLOOD RIGHT ANTECUBITAL  Final   Special Requests BOTTLES DRAWN AEROBIC AND ANAEROBIC 10MLS  Final   Culture NO GROWTH 3 DAYS  Final   Report Status PENDING  Incomplete  Urine culture     Status: None   Collection Time: 04/20/15 11:11 AM  Result Value Ref Range Status   Specimen Description URINE, CATHETERIZED  Final   Special Requests NONE  Final   Culture   Final    >=100,000 COLONIES/mL GROUP B STREP(S.AGALACTIAE)ISOLATED TESTING AGAINST S. AGALACTIAE NOT ROUTINELY PERFORMED DUE TO PREDICTABILITY OF AMP/PEN/VAN SUSCEPTIBILITY.    Report Status 04/21/2015 FINAL  Final    RADIOLOGY STUDIES/RESULTS: Ct Abdomen Pelvis Wo Contrast  04/20/2015  CLINICAL DATA:  Post fall now with worsening low back pain. EXAM: CT ABDOMEN AND PELVIS WITHOUT CONTRAST TECHNIQUE: Multidetector CT imaging of the abdomen and pelvis was performed following the standard protocol without IV contrast. COMPARISON:  Lumbar spine MRI - 01/28/2008 FINDINGS: The lack of intravenous contrast limits the ability to evaluate solid abdominal organs. Normal hepatic contour. Radiopaque gallstones and layering sludge is seen within otherwise normal-appearing gallbladder. No definitive gallbladder wall thickening or pericholecystic fluid on this  noncontrast examination. No ascites. The hepatic flexure of the  colon is incidentally noted to be interposed adjacent to the anterior aspect of the right lobe of the liver. The bilateral kidneys appear mildly atrophic with diffuse cortical thinning. Otherwise, normal noncontrast appearance of the bilateral kidneys. There is a minimal amount of grossly symmetric bilateral perinephric stranding presumably age and body habitus related. No renal stones. No renal stones are seen along the course of either ureter or the urinary bladder. Normal noncontrast appearance of the urinary bladder given degree distention. No urinary obstruction. Note is made of an approximately 2.9 x 2.2 cm right adrenal gland nodule which appears morphologically similar to the 12/2007 examination. Normal noncontrast appearance of the left adrenal gland, pancreas and spleen. Ingested enteric contrast extends to the level of the proximal descending colon. Scattered colonic diverticulosis without evidence of diverticulitis. No evidence of enteric obstruction. No pneumoperitoneum, pneumatosis or portal venous gas. Large amount of calcified atherosclerotic plaque within a normal caliber abdominal aorta. No bulky retroperitoneal, mesenteric, pelvic or inguinal lymphadenopathy. Normal appearance of the pelvic organs for age. No discrete adnexal lesion. No free fluid in the pelvic cul-de-sac. Limited visualization of the lower thorax is degraded secondary to patient respiratory artifact. No discrete focal airspace opacities. No pleural effusion. Borderline cardiomegaly. Exuberant calcifications within the mitral valve annulus. Coronary artery calcifications. No pericardial effusion. No acute or aggressive osseous abnormalities. Moderate to severe multilevel thoracolumbar spine DDD, likely worse at L4-L5 and L5-S1 with disc space height loss, endplate irregularity and sclerosis. Stigmata of DISH within the caudal aspect of the thoracic spine. Small  mesenteric fat containing periumbilical hernia. Mild diffuse body wall anasarca, most conspicuous about the midline of the low back. Regional soft tissues appear otherwise normal. IMPRESSION: 1. No acute findings within the abdomen or pelvis. Specifically, no evidence of enteric or urinary obstruction. No acute osseus abnormalities. 2. Moderate severe multilevel thoracolumbar spine DDD, progressed since the 2009 examination. 3. 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 4. Mild atrophy of the bilateral kidneys, nonspecific though could be seen in the setting of medical renal disease. Clinical correlation is advised. 5. Colonic diverticulosis without evidence of diverticulitis. 6. Borderline cardiomegaly with exuberant calcifications within the mitral valve annulus. Coronary artery calcifications. 7. Cholelithiasis without evidence of cholecystitis on this noncontrast examination. Electronically Signed   By: Sandi Mariscal M.D.   On: 04/20/2015 15:03   Dg Chest 2 View  04/23/2015  CLINICAL DATA:  Shortness of breath today, slight weakness.  Cough. EXAM: CHEST  2 VIEW COMPARISON:  04/23/2015 FINDINGS: Airspace opacity noted posteriorly on the lateral view concerning for pneumonia, likely within the left lower lobe. No confluent opacity on the right. Cardiomegaly with vascular congestion. No visible effusions or acute bony abnormality. IMPRESSION: Airspace opacity posteriorly on the lateral view, likely left lower lobe pneumonia. Cardiomegaly, vascular congestion. Electronically Signed   By: Rolm Baptise M.D.   On: 04/23/2015 14:22   US Abdomen Complete  04/20/2015  CLINICAL DATA:  Nausea and vomiting for 1 day, history of acute pancreatitis EXAM: ABDOMEN ULTRASOUND COMPLETE COMPARISON:  04/20/2015 FINDINGS: Gallbladder: Well distended with multiple mobile gallstones. No  gallbladder wall thickening or pericholecystic fluid is noted. Common bile duct: Diameter: 9.4 mm. Liver: No focal lesion identified. Within normal limits in parenchymal echogenicity. IVC: No abnormality visualized. Pancreas: Not well seen due to overlying bowel gas. Spleen: Size and appearance within normal limits. Right Kidney: Length:  12.5 cm.  Diffuse cortical thinning is noted. Left Kidney: Length: 12.1 cm.  Diffuse cortical thinning is noted. Abdominal aorta: No aneurysm visualized. Other findings: None. IMPRESSION: Cholelithiasis without complicating factors. Electronically Signed   By: Inez Catalina M.D.   On: 04/20/2015 17:58   Mr Abdomen Mrcp Wo Cm  04/21/2015  CLINICAL DATA:  Abdominal pain.  Nausea. EXAM: MRI ABDOMEN WITHOUT CONTRAST  (INCLUDING MRCP) TECHNIQUE: Multiplanar multisequence MR imaging of the abdomen was performed. Heavily T2-weighted images of the biliary and pancreatic ducts were obtained, and three-dimensional MRCP images were rendered by post processing. COMPARISON:  04/20/2015 and MR from 01/21/2008 FINDINGS: Lower chest: Small pleural effusions and right lower lobe airspace consolidation noted. Hepatobiliary: There is hepatic steatosis noted. No focal liver abnormality identified. Stones noted within the gallbladder. These measure up to 1 cm. There is mild intrahepatic bile duct dilatation. Within the distal common bile duct there is a stone measuring 9 x 6 mm, image 27 of series 4. Pancreas: Negative. Spleen: The spleen is borderline enlarged measuring 13 cm in length. Adrenals/Urinary Tract: Susceptibility artifact is identified within the right adrenal nodule compatible with hemorrhage. Stomach/Bowel: The stomach appears normal. No abnormal dilatation of the visualized upper abdominal bowel loops. Vascular/Lymphatic: Normal appearance of the abdominal aorta. No enlarged upper abdominal lymph nodes. Other: No free fluid or fluid collections identified. Musculoskeletal: Normal  signal from within the bone marrow. IMPRESSION: 1. Gallstones. The common bile duct is increased in caliber and there is a stone within the distal CBD. 2. Hepatic steatosis 3. Right adrenal nodule. Susceptibility artifact noted suggesting previous hemorrhage. This does not appear significantly changed from 01/21/2008 compatible with a benign process. Electronically Signed   By: Kerby Moors M.D.   On: 04/21/2015 14:54   Mr 3d Recon At Scanner  04/21/2015  CLINICAL DATA:  Abdominal pain.  Nausea. EXAM: MRI ABDOMEN WITHOUT CONTRAST  (INCLUDING MRCP) TECHNIQUE: Multiplanar multisequence MR imaging of the abdomen was performed. Heavily T2-weighted images of the biliary and pancreatic ducts were obtained, and three-dimensional MRCP images were rendered by post processing. COMPARISON:  04/20/2015 and MR from 01/21/2008 FINDINGS: Lower chest: Small pleural effusions and right lower lobe airspace consolidation noted. Hepatobiliary: There is hepatic steatosis noted. No focal liver abnormality identified. Stones noted within the gallbladder. These measure up to 1 cm. There is mild intrahepatic bile duct dilatation. Within the distal common bile duct there is a stone measuring 9 x 6 mm, image 27 of series 4. Pancreas: Negative. Spleen: The spleen is borderline enlarged measuring 13 cm in length. Adrenals/Urinary Tract: Susceptibility artifact is identified within the right adrenal nodule compatible with hemorrhage. Stomach/Bowel: The stomach appears normal. No abnormal dilatation of the visualized upper abdominal bowel loops. Vascular/Lymphatic: Normal appearance of the abdominal aorta. No enlarged upper abdominal lymph nodes. Other: No free fluid or fluid collections identified. Musculoskeletal: Normal signal from within the bone marrow. IMPRESSION: 1. Gallstones. The common bile duct is increased in caliber and there is a stone within the distal CBD. 2. Hepatic steatosis 3. Right adrenal nodule. Susceptibility artifact  noted suggesting previous hemorrhage. This does not appear significantly changed from 01/21/2008 compatible with a benign process. Electronically Signed   By: Kerby Moors M.D.   On: 04/21/2015 14:54   Dg Chest Port 1 View  04/23/2015  CLINICAL DATA:  80 year old with 3 day history of shortness of breath and nonproductive cough. Current history of hypertension, diabetes, COPD and stage 4 chronic kidney disease. EXAM: PORTABLE CHEST 1 VIEW  COMPARISON:  04/22/2015 and earlier. FINDINGS: Markedly suboptimal inspiration. No significant change in the airspace opacities in the right lung base and right upper lobe. Pulmonary venous hypertension without overt edema, unchanged. Developing airspace consolidation in the medial left lung base. IMPRESSION: Markedly suboptimal inspiration. Stable pneumonia involving the right upper lobe and right lung base. Developing pneumonia at the left lung base. Electronically Signed   By: Evangeline Dakin M.D.   On: 04/23/2015 08:10   Dg Chest Port 1 View  04/23/2015  CLINICAL DATA:  Hypoxia. EXAM: PORTABLE CHEST 1 VIEW COMPARISON:  04/21/2015 FINDINGS: Cardiomediastinal silhouette is enlarged. Mediastinal contours appear intact. The aorta is torturous and contains atherosclerotic calcifications There is no evidence of pleural effusion or pneumothorax. There is opacification of the right lower lobe. More subtle airspace consolidation is also seen in the left cardiophrenic angle. Osseous structures are without acute abnormality. Soft tissues are grossly normal. IMPRESSION: Enlarged cardiac silhouette. Large area of airspace opacification in the right lower lobe, and more subtle opacification in the left lower lobe. Differential diagnosis includes multifocal pneumonia or less likely pulmonary edema. An area of more pronounced increased density in the right mid lung, with which pulmonary mass cannot be excluded. Electronically Signed   By: Fidela Salisbury M.D.   On: 04/23/2015  00:49   Dg Chest Port 1 View  04/20/2015  CLINICAL DATA:  Fever and nausea EXAM: PORTABLE CHEST 1 VIEW COMPARISON:  PA and lateral chest x-ray of January 28, 2008 FINDINGS: The lungs are adequately inflated. There are coarse lung markings in the left retrocardiac region. There is no pleural effusion. The cardiac silhouette is enlarged. The pulmonary vascularity is not engorged. There is increased soft tissue density in the left suprahilar region lying lateral to the aortic arch. The trachea is midline. The bony thorax exhibits no acute abnormality. IMPRESSION: Stable enlargement cardiac silhouette without significant pulmonary vascular congestion. Increased prominence of the soft tissues in the left hilar and suprahilar regions may be in part due to projection. However, atelectasis or infiltrate in the right upper lobe is not excluded. If the patient can tolerate the procedure, a PA and lateral chest x-ray would be useful. Electronically Signed   By: David  Martinique M.D.   On: 04/20/2015 09:33   Dg Ercp Biliary & Pancreatic Ducts  04/22/2015  CLINICAL DATA:  Gallstone. EXAM: ERCP TECHNIQUE: Multiple spot images obtained with the fluoroscopic device and submitted for interpretation post-procedure. FLUOROSCOPY TIME:  If the device does not provide the exposure index: Fluoroscopy Time:  2 minutes and 36 seconds Number of Acquired Images:  3 COMPARISON:  MRI 04/21/2015 FINDINGS: Wire was advanced into the common bile duct. Evidence for a sphincterotomy procedure. Contrast opacification of the biliary system demonstrates a dilated common bile duct. No definite filling defects in the common bile duct. IMPRESSION: Dilated common bile duct. These images were submitted for radiologic interpretation only. Please see the procedural report for the amount of contrast and the fluoroscopy time utilized. Electronically Signed   By: Markus Daft M.D.   On: 04/22/2015 19:32    Thurnell Lose, MD  [facility] Pager:[phone]  If 7PM-7AM, please contact night-coverage [email or web address] Password [email or web address] 04/24/2015, 11:54 AM   LOS: 4 days

## 2015-04-24 NOTE — Progress Notes (Signed)
  Had radiologist read Stress portion only:  IMPRESSION: 1. No evidence of ischemia or infarction.  2. Normal left ventricular wall motion.  3. Left ventricular ejection fraction 71%  4. Low-risk stress test findings*.    Since stress portion is normal, resting portion must be normal by logic. No need for resting portion tomorrow. Please see MD note today.   Hilbert Corrigan PA Pager: [phone]

## 2015-04-24 NOTE — Progress Notes (Signed)
Pt CBG 449. MD paged. Orders given and STAT glucose ordered to verify. Lab results CBG 335.

## 2015-04-24 NOTE — Progress Notes (Signed)
Patient Name: Anna [name] Date of Encounter: 04/24/2015  Primary Cardiologist: Dr. Percival Spanish   Principal Problem:   Acute biliary pancreatitis Active Problems:   Diabetes mellitus, insulin dependent (IDDM), uncontrolled (HCC)   Hypertension   Hyperlipidemia   COPD (chronic obstructive pulmonary disease) (HCC)   Diabetic neuropathy (HCC)   RLS (restless legs syndrome)   Peripheral edema   Chronic pain syndrome   Anemia, iron deficiency   Elevated lactic acid level   OSA (obstructive sleep apnea)   CKD (chronic kidney disease), stage IV (HCC)   Calculus of bile duct with obstruction and without cholangitis or cholecystitis   Preoperative cardiovascular examination   Chest pain   DOE (dyspnea on exertion)   Pre-op evaluation    SUBJECTIVE  Denies any abdominal discomfort and chest pain. Mild SOB.   CURRENT MEDS . amLODipine  5 mg Oral Daily  . budesonide-formoterol  1 puff Inhalation BID  . carvedilol  12.5 mg Oral BID WC  . citalopram  20 mg Oral Daily  . cloNIDine  0.2 mg Oral BID  . furosemide  20 mg Intravenous Daily  . gabapentin  300 mg Oral QID  . insulin aspart  0-15 Units Subcutaneous Q4H  . insulin glargine  30 Units Subcutaneous Daily  . nitroGLYCERIN  0.5 inch Topical 4 times per day  . pantoprazole  40 mg Oral Daily  . piperacillin-tazobactam (ZOSYN)  IV  3.375 g Intravenous 3 times per day  . regadenoson      . sodium chloride  3 mL Intravenous Q12H  . vancomycin  1,250 mg Intravenous Q24H    OBJECTIVE  Filed Vitals:   04/24/15 0941 04/24/15 0944 04/24/15 0946 04/24/15 0948  BP: 162/36 154/46 158/52 156/49  Pulse: 83 82 82 80  Temp:      TempSrc:      Resp:      Height:      Weight:      SpO2:        Intake/Output Summary (Last 24 hours) at 04/24/15 0949 Last data filed at 04/24/15 0847  Gross per 24 hour  Intake 1251.33 ml  Output    320 ml  Net 931.33 ml   Filed Weights   04/22/15 0606 04/23/15 0542 04/24/15 0543  Weight:  230 lb 14.4 oz (104.736 kg) 226 lb 3.1 oz (102.6 kg) 226 lb 13.7 oz (102.9 kg)    PHYSICAL EXAM  General: Pleasant, NAD. Neuro: Alert and oriented X 3. Moves all extremities spontaneously. Psych: Normal affect. HEENT:  Normal  Neck: Supple without bruits or JVD. Lungs:  Resp regular and unlabored, CTA. Heart: RRR no s3, s4, or murmurs. Abdomen: Soft, non-tender, non-distended, BS + x 4.  Extremities: No clubbing, cyanosis or edema. DP/PT/Radials 2+ and equal bilaterally.  Accessory Clinical Findings  CBC  Recent Labs  04/22/15 1030 04/23/15 0617  WBC 7.9 7.7  HGB 10.4* 10.6*  HCT 34.1* 36.9  MCV 96.6 97.4  PLT 157 [id number]   Basic Metabolic Panel  Recent Labs  04/23/15 0008 04/23/15 0617 04/24/15 0126  NA 144 145  --   K 4.2 4.5 4.5  CL 111 110  --   CO2 23 25  --   GLUCOSE 233* 245* 335*  BUN 34* 33*  --   CREATININE 1.60* 1.67*  --   CALCIUM 8.4* 8.7*  --   MG  --   --  2.1   Liver Function Tests  Recent Labs  04/22/15 1030 04/23/15 0617  AST 96* 225*  ALT 91* 126*  ALKPHOS 41 75  BILITOT 1.2 3.6*  PROT 5.6* 6.4*  ALBUMIN 2.6* 2.5*   D-Dimer  Recent Labs  04/23/15 0008  DDIMER 2.52*    TELE NSR with 1st degree AV block    ECG  No new EKG  Echocardiogram 04/21/2015  LV EF: 55% -  60%  ------------------------------------------------------------------- Indications:   V728.1 Pre-op evaluation.  ------------------------------------------------------------------- History:  PMH:  Chronic obstructive pulmonary disease. Risk factors: Hypertension. Diabetes mellitus. Dyslipidemia.  ------------------------------------------------------------------- Study Conclusions  - Left ventricle: The cavity size was normal. There was mild concentric hypertrophy. Systolic function was normal. The estimated ejection fraction was in the range of 55% to 60%. Wall motion was normal; there were no regional wall motion abnormalities.  Features are consistent with a pseudonormal left ventricular filling pattern, with concomitant abnormal relaxation and increased filling pressure (grade 2 diastolic dysfunction). - Aortic valve: There was mild stenosis. Valve area (VTI): 1.74 cm^2. Valve area (Vmax): 1.71 cm^2. Valve area (Vmean): 1.56 cm^2. - Mitral valve: Severely calcified annulus. Mildly thickened leaflets . The findings are consistent with trivial stenosis. There was mild regurgitation. Valve area by pressure half-time: 2.04 cm^2. Valve area by continuity equation (using LVOT flow): 2.03 cm^2. - Left atrium: The atrium was moderately dilated. - Pulmonary arteries: PA peak pressure: 31 mm Hg (S).     Radiology/Studies  Ct Abdomen Pelvis Wo Contrast  04/20/2015  CLINICAL DATA:  Post fall now with worsening low back pain. EXAM: CT ABDOMEN AND PELVIS WITHOUT CONTRAST TECHNIQUE: Multidetector CT imaging of the abdomen and pelvis was performed following the standard protocol without IV contrast. COMPARISON:  Lumbar spine MRI - 01/28/2008 FINDINGS: The lack of intravenous contrast limits the ability to evaluate solid abdominal organs. Normal hepatic contour. Radiopaque gallstones and layering sludge is seen within otherwise normal-appearing gallbladder. No definitive gallbladder wall thickening or pericholecystic fluid on this noncontrast examination. No ascites. The hepatic flexure of the colon is incidentally noted to be interposed adjacent to the anterior aspect of the right lobe of the liver. The bilateral kidneys appear mildly atrophic with diffuse cortical thinning. Otherwise, normal noncontrast appearance of the bilateral kidneys. There is a minimal amount of grossly symmetric bilateral perinephric stranding presumably age and body habitus related. No renal stones. No renal stones are seen along the course of either ureter or the urinary bladder. Normal noncontrast appearance of the urinary bladder given  degree distention. No urinary obstruction. Note is made of an approximately 2.9 x 2.2 cm right adrenal gland nodule which appears morphologically similar to the 12/2007 examination. Normal noncontrast appearance of the left adrenal gland, pancreas and spleen. Ingested enteric contrast extends to the level of the proximal descending colon. Scattered colonic diverticulosis without evidence of diverticulitis. No evidence of enteric obstruction. No pneumoperitoneum, pneumatosis or portal venous gas. Large amount of calcified atherosclerotic plaque within a normal caliber abdominal aorta. No bulky retroperitoneal, mesenteric, pelvic or inguinal lymphadenopathy. Normal appearance of the pelvic organs for age. No discrete adnexal lesion. No free fluid in the pelvic cul-de-sac. Limited visualization of the lower thorax is degraded secondary to patient respiratory artifact. No discrete focal airspace opacities. No pleural effusion. Borderline cardiomegaly. Exuberant calcifications within the mitral valve annulus. Coronary artery calcifications. No pericardial effusion. No acute or aggressive osseous abnormalities. Moderate to severe multilevel thoracolumbar spine DDD, likely worse at L4-L5 and L5-S1 with disc space height loss, endplate irregularity and sclerosis. Stigmata of DISH within the caudal aspect of the thoracic  spine. Small mesenteric fat containing periumbilical hernia. Mild diffuse body wall anasarca, most conspicuous about the midline of the low back. Regional soft tissues appear otherwise normal. IMPRESSION: 1. No acute findings within the abdomen or pelvis. Specifically, no evidence of enteric or urinary obstruction. No acute osseus abnormalities. 2. Moderate severe multilevel thoracolumbar spine DDD, progressed since the 2009 examination. 3. 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 4. Mild atrophy of the bilateral kidneys, nonspecific though could be seen in the setting of medical renal disease. Clinical correlation is advised. 5. Colonic diverticulosis without evidence of diverticulitis. 6. Borderline cardiomegaly with exuberant calcifications within the mitral valve annulus. Coronary artery calcifications. 7. Cholelithiasis without evidence of cholecystitis on this noncontrast examination. Electronically Signed   By: Sandi Mariscal M.D.   On: 04/20/2015 15:03   Dg Chest 2 View  04/23/2015  CLINICAL DATA:  Shortness of breath today, slight weakness.  Cough. EXAM: CHEST  2 VIEW COMPARISON:  04/23/2015 FINDINGS: Airspace opacity noted posteriorly on the lateral view concerning for pneumonia, likely within the left lower lobe. No confluent opacity on the right. Cardiomegaly with vascular congestion. No visible effusions or acute bony abnormality. IMPRESSION: Airspace opacity posteriorly on the lateral view, likely left lower lobe pneumonia. Cardiomegaly, vascular congestion. Electronically Signed   By: Rolm Baptise M.D.   On: 04/23/2015 14:22   US Abdomen Complete  04/20/2015  CLINICAL DATA:  Nausea and vomiting for 1 day, history of acute pancreatitis EXAM: ABDOMEN ULTRASOUND COMPLETE COMPARISON:  04/20/2015 FINDINGS: Gallbladder: Well distended with multiple mobile gallstones. No gallbladder wall thickening or pericholecystic fluid is noted. Common bile duct: Diameter: 9.4 mm. Liver: No focal lesion identified. Within normal limits in parenchymal echogenicity. IVC: No abnormality visualized. Pancreas: Not well seen due to overlying bowel gas. Spleen: Size and appearance within normal limits. Right Kidney: Length: 12.5 cm.  Diffuse cortical thinning is noted. Left Kidney: Length: 12.1 cm.  Diffuse cortical thinning is noted. Abdominal aorta: No aneurysm visualized. Other findings: None. IMPRESSION: Cholelithiasis without complicating factors. Electronically Signed   By:  Inez Catalina M.D.   On: 04/20/2015 17:58   Mr Abdomen Mrcp Wo Cm  04/21/2015  CLINICAL DATA:  Abdominal pain.  Nausea. EXAM: MRI ABDOMEN WITHOUT CONTRAST  (INCLUDING MRCP) TECHNIQUE: Multiplanar multisequence MR imaging of the abdomen was performed. Heavily T2-weighted images of the biliary and pancreatic ducts were obtained, and three-dimensional MRCP images were rendered by post processing. COMPARISON:  04/20/2015 and MR from 01/21/2008 FINDINGS: Lower chest: Small pleural effusions and right lower lobe airspace consolidation noted. Hepatobiliary: There is hepatic steatosis noted. No focal liver abnormality identified. Stones noted within the gallbladder. These measure up to 1 cm. There is mild intrahepatic bile duct dilatation. Within the distal common bile duct there is a stone measuring 9 x 6 mm, image 27 of series 4. Pancreas: Negative. Spleen: The spleen is borderline enlarged measuring 13 cm in length. Adrenals/Urinary Tract: Susceptibility artifact is identified within the right adrenal nodule compatible with hemorrhage. Stomach/Bowel: The stomach appears normal. No abnormal dilatation of the visualized upper abdominal bowel loops. Vascular/Lymphatic: Normal appearance of the abdominal aorta. No enlarged upper abdominal lymph nodes. Other: No free fluid or fluid collections identified. Musculoskeletal: Normal signal from within the bone marrow. IMPRESSION: 1. Gallstones. The common bile duct is increased in caliber and there is a stone within the distal CBD. 2.  Hepatic steatosis 3. Right adrenal nodule. Susceptibility artifact noted suggesting previous hemorrhage. This does not appear significantly changed from 01/21/2008 compatible with a benign process. Electronically Signed   By: Kerby Moors M.D.   On: 04/21/2015 14:54   Mr 3d Recon At Scanner  04/21/2015  CLINICAL DATA:  Abdominal pain.  Nausea. EXAM: MRI ABDOMEN WITHOUT CONTRAST  (INCLUDING MRCP) TECHNIQUE: Multiplanar multisequence MR  imaging of the abdomen was performed. Heavily T2-weighted images of the biliary and pancreatic ducts were obtained, and three-dimensional MRCP images were rendered by post processing. COMPARISON:  04/20/2015 and MR from 01/21/2008 FINDINGS: Lower chest: Small pleural effusions and right lower lobe airspace consolidation noted. Hepatobiliary: There is hepatic steatosis noted. No focal liver abnormality identified. Stones noted within the gallbladder. These measure up to 1 cm. There is mild intrahepatic bile duct dilatation. Within the distal common bile duct there is a stone measuring 9 x 6 mm, image 27 of series 4. Pancreas: Negative. Spleen: The spleen is borderline enlarged measuring 13 cm in length. Adrenals/Urinary Tract: Susceptibility artifact is identified within the right adrenal nodule compatible with hemorrhage. Stomach/Bowel: The stomach appears normal. No abnormal dilatation of the visualized upper abdominal bowel loops. Vascular/Lymphatic: Normal appearance of the abdominal aorta. No enlarged upper abdominal lymph nodes. Other: No free fluid or fluid collections identified. Musculoskeletal: Normal signal from within the bone marrow. IMPRESSION: 1. Gallstones. The common bile duct is increased in caliber and there is a stone within the distal CBD. 2. Hepatic steatosis 3. Right adrenal nodule. Susceptibility artifact noted suggesting previous hemorrhage. This does not appear significantly changed from 01/21/2008 compatible with a benign process. Electronically Signed   By: Kerby Moors M.D.   On: 04/21/2015 14:54   Dg Chest Port 1 View  04/23/2015  CLINICAL DATA:  80 year old with 3 day history of shortness of breath and nonproductive cough. Current history of hypertension, diabetes, COPD and stage 4 chronic kidney disease. EXAM: PORTABLE CHEST 1 VIEW COMPARISON:  04/22/2015 and earlier. FINDINGS: Markedly suboptimal inspiration. No significant change in the airspace opacities in the right lung base  and right upper lobe. Pulmonary venous hypertension without overt edema, unchanged. Developing airspace consolidation in the medial left lung base. IMPRESSION: Markedly suboptimal inspiration. Stable pneumonia involving the right upper lobe and right lung base. Developing pneumonia at the left lung base. Electronically Signed   By: Evangeline Dakin M.D.   On: 04/23/2015 08:10   Dg Chest Port 1 View  04/23/2015  CLINICAL DATA:  Hypoxia. EXAM: PORTABLE CHEST 1 VIEW COMPARISON:  04/21/2015 FINDINGS: Cardiomediastinal silhouette is enlarged. Mediastinal contours appear intact. The aorta is torturous and contains atherosclerotic calcifications There is no evidence of pleural effusion or pneumothorax. There is opacification of the right lower lobe. More subtle airspace consolidation is also seen in the left cardiophrenic angle. Osseous structures are without acute abnormality. Soft tissues are grossly normal. IMPRESSION: Enlarged cardiac silhouette. Large area of airspace opacification in the right lower lobe, and more subtle opacification in the left lower lobe. Differential diagnosis includes multifocal pneumonia or less likely pulmonary edema. An area of more pronounced increased density in the right mid lung, with which pulmonary mass cannot be excluded. Electronically Signed   By: Fidela Salisbury M.D.   On: 04/23/2015 00:49   Dg Chest Port 1 View  04/20/2015  CLINICAL DATA:  Fever and nausea EXAM: PORTABLE CHEST 1 VIEW COMPARISON:  PA and lateral chest x-ray of January 28, 2008 FINDINGS: The lungs are adequately inflated. There are  coarse lung markings in the left retrocardiac region. There is no pleural effusion. The cardiac silhouette is enlarged. The pulmonary vascularity is not engorged. There is increased soft tissue density in the left suprahilar region lying lateral to the aortic arch. The trachea is midline. The bony thorax exhibits no acute abnormality. IMPRESSION: Stable enlargement cardiac  silhouette without significant pulmonary vascular congestion. Increased prominence of the soft tissues in the left hilar and suprahilar regions may be in part due to projection. However, atelectasis or infiltrate in the right upper lobe is not excluded. If the patient can tolerate the procedure, a PA and lateral chest x-ray would be useful. Electronically Signed   By: David  Martinique M.D.   On: 04/20/2015 09:33   Dg Ercp Biliary & Pancreatic Ducts  04/22/2015  CLINICAL DATA:  Gallstone. EXAM: ERCP TECHNIQUE: Multiple spot images obtained with the fluoroscopic device and submitted for interpretation post-procedure. FLUOROSCOPY TIME:  If the device does not provide the exposure index: Fluoroscopy Time:  2 minutes and 36 seconds Number of Acquired Images:  3 COMPARISON:  MRI 04/21/2015 FINDINGS: Wire was advanced into the common bile duct. Evidence for a sphincterotomy procedure. Contrast opacification of the biliary system demonstrates a dilated common bile duct. No definite filling defects in the common bile duct. IMPRESSION: Dilated common bile duct. These images were submitted for radiologic interpretation only. Please see the procedural report for the amount of contrast and the fluoroscopy time utilized. Electronically Signed   By: Markus Daft M.D.   On: 04/22/2015 19:32    ASSESSMENT AND PLAN  80 year old female patient with multiple medical problems including hypertension, COPD, diabetes on insulin, peripheral neuropathy and restless leg syndrome, depression with anxiety, chronic back pain, dyslipidemia, sleep apnea and chronic kidney disease stage IV. Patient presented to the ER with unrelenting nausea without emesis (one episode dry heave) associated initially with upper abdominal and back pain. Seen by surgery for gallstone pancreatitis. Cardiology consulted for risk stratification and DOE.  1. Gallstone pancreatitis: per surgery, recommend not proceeding with lap chole this admission. She does not  have cholecystitis, which makes her operation, not emergent  - appears to be getting better. Hopefully can avoid surgery, given her history, she is likely high risk for surgical intervention  2. DOE: 2 day myoview pending, stress portion today, resting portion tomorrow.  3. Elevated d-dimer: not good candidate for CTA of chest given renal insufficiency, consider V/Q scan, however V/Q has to be delayed for 48 hours after stress test. If resting portion is tomorrow, then V/Q scan has to be Tue. Will ask radiology to read stress only portion today, if normal, then no need to do resting tomorrow. However if stress only portion is abnormal, then will need resting portion tomorrow to make it two days.  4. CKD stage IV 5. OSA    Signed, Almyra Deforest PA-C Pager: [phone]  I have seen and examined the patient along with Almyra Deforest PA-C.  I have reviewed the chart, notes and new data.  I agree with PA's note.  Key new findings / data: stress nuclear perfusion images are normal, no signs of ischemia. Normal LV EF and wall motion.  PLAN:  No indication for further cardiac evaluation prior to surgery. Obesity and comorbid conditions increase her surgical risk, but she has normal LVEF and no evidence for coronary or serious valvular abnormalities (mild aortic stenosis). Her dyspnea is multifactorial (obesity, COPD, diastolic HF, OSA). Avoid excessive IV fluid administration.  Sanda Klein, MD, [facility]  [facility] [phone] 04/24/2015, 2:57 PM

## 2015-04-25 ENCOUNTER — Inpatient Hospital Stay (HOSPITAL_COMMUNITY): Payer: Self-pay

## 2015-04-25 ENCOUNTER — Inpatient Hospital Stay (HOSPITAL_COMMUNITY): Payer: Medicare Other

## 2015-04-25 DIAGNOSIS — R0602 Shortness of breath: Secondary | ICD-10-CM

## 2015-04-25 DIAGNOSIS — Z01818 Encounter for other preprocedural examination: Secondary | ICD-10-CM

## 2015-04-25 LAB — CULTURE, BLOOD (ROUTINE X 2)
Culture: NO GROWTH
Culture: NO GROWTH

## 2015-04-25 LAB — GLUCOSE, CAPILLARY: GLUCOSE-CAPILLARY: 155 mg/dL — AB (ref 65–99)

## 2015-04-25 MED ORDER — FUROSEMIDE 40 MG PO TABS: 40.0000 mg | ORAL_TABLET | Freq: Every day | ORAL | Status: DC

## 2015-04-25 MED ORDER — AMLODIPINE BESYLATE 10 MG PO TABS: 10.0000 mg | ORAL_TABLET | Freq: Every day | ORAL | Status: DC

## 2015-04-25 MED ORDER — CLONIDINE HCL 0.2 MG PO TABS: 0.2000 mg | ORAL_TABLET | Freq: Three times a day (TID) | ORAL | Status: DC

## 2015-04-25 MED ORDER — ISOSORBIDE MONONITRATE ER 30 MG PO TB24: 30.0000 mg | ORAL_TABLET | Freq: Every day | ORAL | Status: DC

## 2015-04-25 NOTE — Progress Notes (Signed)
*  Preliminary Results* Bilateral lower extremity venous duplex completed. Bilateral lower extremities are negative for deep vein thrombosis. There is no evidence of Baker's cyst bilaterally.  04/25/2015  Maudry Mayhew, RVT, RDCS, RDMS

## 2015-04-25 NOTE — Discharge Instructions (Signed)
Follow with Primary MD Chevis Pretty, FNP in 3 days   Get CBC, CMP, 2 view Chest X ray checked  by Primary MD next visit.    Activity: As tolerated with Full fall precautions use walker/cane & assistance as needed   Disposition Home     Diet:   Heart Healthy low carbohydrate, low-fat and low-cholesterol diet   Accuchecks 4 times/day, Once in AM empty stomach and then before each meal. Log in all results and show them to your Prim.MD in 3 days. If any glucose reading is under 80 or above 300 call your Prim MD immidiately. Follow Low glucose instructions for glucose under 80 as instructed.   For Heart failure patients - Check your Weight same time everyday, if you gain over 2 pounds, or you develop in leg swelling, experience more shortness of breath or chest pain, call your Primary MD immediately. Follow Cardiac Low Salt Diet and 1.5 lit/day fluid restriction.   On your next visit with your primary care physician please Get Medicines reviewed and adjusted.   Please request your Prim.MD to go over all Hospital Tests and Procedure/Radiological results at the follow up, please get all Hospital records sent to your Prim MD by signing hospital release before you go home.   If you experience worsening of your admission symptoms, develop shortness of breath, life threatening emergency, suicidal or homicidal thoughts you must seek medical attention immediately by calling 911 or calling your MD immediately  if symptoms less severe.  You Must read complete instructions/literature along with all the possible adverse reactions/side effects for all the Medicines you take and that have been prescribed to you. Take any new Medicines after you have completely understood and accpet all the possible adverse reactions/side effects.   Do not drive, operating heavy machinery, perform activities at heights, swimming or participation in water activities or provide baby sitting services if your were  admitted for syncope or siezures until you have seen by Primary MD or a Neurologist and advised to do so again.  Do not drive when taking Pain medications.    Do not take more than prescribed Pain, Sleep and Anxiety Medications  Special Instructions: If you have smoked or chewed Tobacco  in the last 2 yrs please stop smoking, stop any regular Alcohol  and or any Recreational drug use.  Wear Seat belts while driving.   Please note  You were cared for by a hospitalist during your hospital stay. If you have any questions about your discharge medications or the care you received while you were in the hospital after you are discharged, you can call the unit and asked to speak with the hospitalist on call if the hospitalist that took care of you is not available. Once you are discharged, your primary care physician will handle any further medical issues. Please note that NO REFILLS for any discharge medications will be authorized once you are discharged, as it is imperative that you return to your primary care physician (or establish a relationship with a primary care physician if you do not have one) for your aftercare needs so that they can reassess your need for medications and monitor your lab values.   Fat and Cholesterol Restricted Diet High levels of fat and cholesterol in your blood may lead to various health problems, such as diseases of the heart, blood vessels, gallbladder, liver, and pancreas. Fats are concentrated sources of energy that come in various forms. Certain types of fat, including saturated fat, may  be harmful in excess. Cholesterol is a substance needed by your body in small amounts. Your body makes all the cholesterol it needs. Excess cholesterol comes from the food you eat. When you have high levels of cholesterol and saturated fat in your blood, health problems can develop because the excess fat and cholesterol will gather along the walls of your blood vessels, causing them to  narrow. Choosing the right foods will help you control your intake of fat and cholesterol. This will help keep the levels of these substances in your blood within normal limits and reduce your risk of disease. WHAT IS MY PLAN? Your health care provider recommends that you:  Get no more than __________ % of the total calories in your daily diet from fat.  Limit your intake of saturated fat to less than ______% of your total calories each day.  Limit the amount of cholesterol in your diet to less than _________mg per day. WHAT TYPES OF FAT SHOULD I CHOOSE?  Choose healthy fats more often. Choose monounsaturated and polyunsaturated fats, such as olive and canola oil, flaxseeds, walnuts, almonds, and seeds.  Eat more omega-3 fats. Good choices include salmon, mackerel, sardines, tuna, flaxseed oil, and ground flaxseeds. Aim to eat fish at least two times a week.  Limit saturated fats. Saturated fats are primarily found in animal products, such as meats, butter, and cream. Plant sources of saturated fats include palm oil, palm kernel oil, and coconut oil.  Avoid foods with partially hydrogenated oils in them. These contain trans fats. Examples of foods that contain trans fats are stick margarine, some tub margarines, cookies, crackers, and other baked goods. WHAT GENERAL GUIDELINES DO I NEED TO FOLLOW? These guidelines for healthy eating will help you control your intake of fat and cholesterol:  Check food labels carefully to identify foods with trans fats or high amounts of saturated fat.  Fill one half of your plate with vegetables and green salads.  Fill one fourth of your plate with whole grains. Look for the word "whole" as the first word in the ingredient list.  Fill one fourth of your plate with lean protein foods.  Limit fruit to two servings a day. Choose fruit instead of juice.  Eat more foods that contain soluble fiber. Examples of foods that contain this type of fiber are  apples, broccoli, carrots, beans, peas, and barley. Aim to get 20-30 g of fiber per day.  Eat more home-cooked food and less restaurant, buffet, and fast food.  Limit or avoid alcohol.  Limit foods high in starch and sugar.  Limit fried foods.  Cook foods using methods other than frying. Baking, boiling, grilling, and broiling are all great options.  Lose weight if you are overweight. Losing just 5-10% of your initial body weight can help your overall health and prevent diseases such as diabetes and heart disease. WHAT FOODS CAN I EAT? Grains Whole grains, such as whole wheat or whole grain breads, crackers, cereals, and pasta. Unsweetened oatmeal, bulgur, barley, quinoa, or brown rice. Corn or whole wheat flour tortillas. Vegetables Fresh or frozen vegetables (raw, steamed, roasted, or grilled). Green salads. Fruits All fresh, canned (in natural juice), or frozen fruits. Meat and Other Protein Products Ground beef (85% or leaner), grass-fed beef, or beef trimmed of fat. Skinless chicken or [place]. Ground chicken or [place]. Pork trimmed of fat. All fish and seafood. Eggs. Dried beans, peas, or lentils. Unsalted nuts or seeds. Unsalted canned or dry beans. Dairy Low-fat dairy products,  such as skim or 1% milk, 2% or reduced-fat cheeses, low-fat ricotta or cottage cheese, or plain low-fat yogurt. Fats and Oils Tub margarines without trans fats. Light or reduced-fat mayonnaise and salad dressings. Avocado. Olive, canola, sesame, or safflower oils. Natural peanut or almond butter (choose ones without added sugar and oil). The items listed above may not be a complete list of recommended foods or beverages. Contact your dietitian for more options. WHAT FOODS ARE NOT RECOMMENDED? Grains White bread. White pasta. White rice. Cornbread. Bagels, pastries, and croissants. Crackers that contain trans fat. Vegetables White potatoes. Corn. Creamed or fried vegetables. Vegetables in a cheese  sauce. Fruits Dried fruits. Canned fruit in light or heavy syrup. Fruit juice. Meat and Other Protein Products Fatty cuts of meat. Ribs, chicken wings, bacon, sausage, bologna, salami, chitterlings, fatback, hot dogs, bratwurst, and packaged luncheon meats. Liver and organ meats. Dairy Whole or 2% milk, cream, half-and-half, and cream cheese. Whole milk cheeses. Whole-fat or sweetened yogurt. Full-fat cheeses. Nondairy creamers and whipped toppings. Processed cheese, cheese spreads, or cheese curds. Sweets and Desserts Corn syrup, sugars, honey, and molasses. Candy. Jam and jelly. Syrup. Sweetened cereals. Cookies, pies, cakes, donuts, muffins, and ice cream. Fats and Oils Butter, stick margarine, lard, shortening, ghee, or bacon fat. Coconut, palm kernel, or palm oils. Beverages Alcohol. Sweetened drinks (such as sodas, lemonade, and fruit drinks or punches). The items listed above may not be a complete list of foods and beverages to avoid. Contact your dietitian for more information.   This information is not intended to replace advice given to you by your health care provider. Make sure you discuss any questions you have with your health care provider.   Document Released: 04/17/2005 Document Revised: 05/08/2014 Document Reviewed: 07/16/2013 Elsevier Interactive Patient Education 2016 [employer].  Cholelithiasis Cholelithiasis (also called gallstones) is a form of gallbladder disease in which gallstones form in your gallbladder. The gallbladder is an organ that stores bile made in the liver, which helps digest fats. Gallstones begin as small crystals and slowly grow into stones. Gallstone pain occurs when the gallbladder spasms and a gallstone is blocking the duct. Pain can also occur when a stone passes out of the duct.  RISK FACTORS  Being female.   Having multiple pregnancies. Health care providers sometimes advise removing diseased gallbladders before future pregnancies.    Being obese.  Eating a diet heavy in fried foods and fat.   Being older than 93 years and increasing age.   Prolonged use of medicines containing female hormones.   Having diabetes mellitus.   Rapidly losing weight.   Having a family history of gallstones (heredity).  SYMPTOMS  Nausea.   Vomiting.  Abdominal pain.   Yellowing of the skin (jaundice).   Sudden pain. It may persist from several minutes to several hours.  Fever.   Tenderness to the touch. In some cases, when gallstones do not move into the bile duct, people have no pain or symptoms. These are called "silent" gallstones.  TREATMENT Silent gallstones do not need treatment. In severe cases, emergency surgery may be required. Options for treatment include:  Surgery to remove the gallbladder. This is the most common treatment.  Medicines. These do not always work and may take 6-12 months or more to work.  Shock wave treatment (extracorporeal biliary lithotripsy). In this treatment an ultrasound machine sends shock waves to the gallbladder to break gallstones into smaller pieces that can pass into the intestines or be dissolved by medicine. HOME  CARE INSTRUCTIONS   Only take over-the-counter or prescription medicines for pain, discomfort, or fever as directed by your health care provider.   Follow a low-fat diet until seen again by your health care provider. Fat causes the gallbladder to contract, which can result in pain.   Follow up with your health care provider as directed. Attacks are almost always recurrent and surgery is usually required for permanent treatment.  SEEK IMMEDIATE MEDICAL CARE IF:   Your pain increases and is not controlled by medicines.   You have a fever or persistent symptoms for more than 2-3 days.   You have a fever and your symptoms suddenly get worse.   You have persistent nausea and vomiting.  MAKE SURE YOU:   Understand these instructions.  Will watch  your condition.  Will get help right away if you are not doing well or get worse.   This information is not intended to replace advice given to you by your health care provider. Make sure you discuss any questions you have with your health care provider.   Document Released: 04/13/2005 Document Revised: 12/18/2012 Document Reviewed: 10/09/2012 Elsevier Interactive Patient Education 2016 [employer]  Acute Pancreatitis Acute pancreatitis is a disease in which the pancreas becomes suddenly inflamed. The pancreas is a large gland located behind your stomach. The pancreas produces enzymes that help digest food. The pancreas also releases the hormones glucagon and insulin that help regulate blood sugar. Damage to the pancreas occurs when the digestive enzymes from the pancreas are activated and begin attacking the pancreas before being released into the intestine. Most acute attacks last a couple of days and can cause serious complications. Some people become dehydrated and develop low blood pressure. In severe cases, bleeding into the pancreas can lead to shock and can be life-threatening. The lungs, heart, and kidneys may fail. CAUSES  Pancreatitis can happen to anyone. In some cases, the cause is unknown. Most cases are caused by:  Alcohol abuse.  Gallstones. Other less common causes are:  Certain medicines.  Exposure to certain chemicals.  Infection.  Damage caused by an accident (trauma).  Abdominal surgery. SYMPTOMS   Pain in the upper abdomen that may radiate to the back.  Tenderness and swelling of the abdomen.  Nausea and vomiting. DIAGNOSIS  Your caregiver will perform a physical exam. Blood and stool tests may be done to confirm the diagnosis. Imaging tests may also be done, such as X-rays, CT scans, or an ultrasound of the abdomen. TREATMENT  Treatment usually requires a stay in the hospital. Treatment may include:  Pain medicine.  Fluid replacement through an  intravenous line (IV).  Placing a tube in the stomach to remove stomach contents and control vomiting.  Not eating for 3 or 4 days. This gives your pancreas a rest, because enzymes are not being produced that can cause further damage.  Antibiotic medicines if your condition is caused by an infection.  Surgery of the pancreas or gallbladder. HOME CARE INSTRUCTIONS   Follow the diet advised by your caregiver. This may involve avoiding alcohol and decreasing the amount of fat in your diet.  Eat smaller, more frequent meals. This reduces the amount of digestive juices the pancreas produces.  Drink enough fluids to keep your urine clear or pale yellow.  Only take over-the-counter or prescription medicines as directed by your caregiver.  Avoid drinking alcohol if it caused your condition.  Do not smoke.  Get plenty of rest.  Check your blood sugar at  home as directed by your caregiver.  Keep all follow-up appointments as directed by your caregiver. SEEK MEDICAL CARE IF:   You do not recover as quickly as expected.  You develop new or worsening symptoms.  You have persistent pain, weakness, or nausea.  You recover and then have another episode of pain. SEEK IMMEDIATE MEDICAL CARE IF:   You are unable to eat or keep fluids down.  Your pain becomes severe.  You have a fever or persistent symptoms for more than 2 to 3 days.  You have a fever and your symptoms suddenly get worse.  Your skin or the white part of your eyes turn yellow (jaundice).  You develop vomiting.  You feel dizzy, or you faint.  Your blood sugar is high (over 300 mg/dL). MAKE SURE YOU:   Understand these instructions.  Will watch your condition.  Will get help right away if you are not doing well or get worse.   This information is not intended to replace advice given to you by your health care provider. Make sure you discuss any questions you have with your health care provider.   Document  Released: 04/17/2005 Document Revised: 10/17/2011 Document Reviewed: 07/27/2011 Elsevier Interactive Patient Education [employer].

## 2015-04-25 NOTE — Care Management Note (Addendum)
Case Management Note  Patient Details  Name: Anna [name] MRN: [id number] Date of Birth: July 14, 1934  Subjective/Objective:   No HH ordered by MD at time of discharge.                 Action/Plan: Anticipate discharge home today. No further CM needs but will be available should additional discharge needs arise.   Expected Discharge Date:                  Expected Discharge Plan:  [place]  In-House Referral:     Discharge planning Services  CM Consult  Post Acute Care Choice:    Choice offered to:     DME Arranged:    DME Agency:     HH Arranged:    [place] Agency:     Status of Service:  In process, will continue to follow  Medicare Important Message Given:  Yes Date Medicare IM Given:    Medicare IM give by:    Date Additional Medicare IM Given:    Additional Medicare Important Message give by:     If discussed at [place] of Stay Meetings, dates discussed:    Additional Comments:  Delrae Sawyers, RN 04/25/2015, 10:18 AM

## 2015-04-25 NOTE — Discharge Summary (Signed)
Anna [name], is a 80 y.o. female  DOB 1934/10/24  MRN [id number].  Admission date:  04/20/2015  Admitting Physician  Waldemar Dickens, MD  Discharge Date:  04/25/2015   Primary MD  Chevis Pretty, FNP  Recommendations for primary care physician for things to follow:   Monitor weight, BMP and diuretic dose closely. Close outpatient follow-up with [facility]   Admission Diagnosis  Acute pancreatitis [K85.90]   Discharge Diagnosis  Acute pancreatitis [K85.90]    Principal Problem:   Acute biliary pancreatitis Active Problems:   Diabetes mellitus, insulin dependent (IDDM), uncontrolled (HCC)   Hypertension   Hyperlipidemia   COPD (chronic obstructive pulmonary disease) (HCC)   Diabetic neuropathy (HCC)   RLS (restless legs syndrome)   Peripheral edema   Chronic pain syndrome   Anemia, iron deficiency   Elevated lactic acid level   OSA (obstructive sleep apnea)   CKD (chronic kidney disease), stage IV (HCC)   Calculus of bile duct with obstruction and without cholangitis or cholecystitis   Preoperative cardiovascular examination   Chest pain   DOE (dyspnea on exertion)   Pre-op evaluation      Past Medical History  Diagnosis Date  . Hypertension   . COPD (chronic obstructive pulmonary disease) ([place])   . Diabetes mellitus without complication ([place])   . Neuromuscular disorder ([place])   . Depression with anxiety   . Osteopenia   . Arthritis   . GERD (gastroesophageal reflux disease)   . Hyperlipidemia   . Obesity   . Vitamin B12 deficiency   . Chronic kidney disease (CKD), stage IV (severe) ([place])   . Sleep apnea     no cpap use  . Anemia     iron infusion- x2 recent , last 5'16  . Hx of adenomatous colonic polyps 09/2014    Past Surgical History  Procedure Laterality Date  . Breast surgery   April 1996    needle biopsy   . Tubal ligation    . Cataract extraction, bilateral Bilateral   . Esophagogastroduodenoscopy N/A 10/19/2014    Procedure: ESOPHAGOGASTRODUODENOSCOPY (EGD);  Surgeon: Gatha Mayer, MD;  Location: [facility];  Service: Endoscopy;  Laterality: N/A;  . Colonoscopy N/A 10/19/2014    Procedure: COLONOSCOPY;  Surgeon: Gatha Mayer, MD;  Location: [facility];  Service: Endoscopy;  Laterality: N/A;  . Ercp N/A 04/22/2015    Procedure: ENDOSCOPIC RETROGRADE CHOLANGIOPANCREATOGRAPHY (ERCP);  Surgeon: Milus Banister, MD;  Location: [facility];  Service: Endoscopy;  Laterality: N/A;       HPI  from the history and physical done on the day of admission:  80 year old female patient with multiple medical problems including hypertension, COPD, diabetes on insulin, peripheral neuropathy and restless leg syndrome, depression with anxiety, chronic back pain, dyslipidemia, sleep apnea and chronic kidney disease stage IV. Patient presented today to the ER with unrelenting nausea without emesis (one episode try heave) associated initially with upper abdominal and back pain. After arrival patient primarily complaining of new low back pain that is  unrelenting and radiating from the mid back down both hips and down the legs. No similar symptoms in the past. Was given Zofran by EMS with resolution of nausea by arrival and reporting hunger and denying abdominal pain.     Hospital Course:     Acute biliary pancreatitis: Improved, abdomen is soft and nontender, start clear liquids. MRCP positive for choledocholithiasis, ERCP today, general surgery contemplating cholecystectomy on 04/24/2015, stable ERCP. Cards requested to evaluate preop. For now family in Gen. surgery have decided to do cholecystectomy in the outpatient setting.   Acute on chronic diastolic CHF with acute hypoxic respiratory failure on chronic - night of 04/22/2015 - became acutely short of breath, chest x-ray  shows bilateral infiltrates clinically suggestive of fluid overload, she clearly has orthopnea and edema along with rales on exam, BNP was elevated. She feels much better after she received Lasix and Nitropaste also on Coreg which will be continued. Echo shows an EF preserved at 60% without any wall motion abnormality. Cardiology on board.  Her d-dimer was elevated but this was in the setting of acute pancreatitis. I do not think she has a PE. There was no RV strain on echogram, she had features of CHF as above.  We check lower extremity venous duplex which was unremarkable, no further workup clinical suspicion of PE is none. Patient is chest pain-free and now shortness of breath has resolved with Lasix. She underwent Myoview per cardiology which was unremarkable with no reversible ischemia and a preserved EF.  She is now back to baseline, no shortness of breath, no oxygen demand, feels much better, her chronic leg edema has almost completely resolved. She will be placed on Lasix 40 mg daily which is higher than her usual dose of 20 mg, she has been educated on fluid and salt restriction. Request PCP to monitor weight, BMP and diuretic dose closely.     Choledocholithiasis: Now due for outpatient laparoscopic cholecystectomy by general surgery.    Acute on chronic kidney disease stage IV: ARF secondary to previous azotemia due to pancreatitis. Currently resolved and back to baseline. Monitor BMP, weight and diuretic dose closely.   Hypertension: better controlled today-continue amlodipine, clonidine, Coreg at current doses--we will reassess tomorrow and adjust doses of medications accordingly.   Dyslipidemia: Resume antilipid treatment once LFTs have normalized.   GERD: Continue PPI   Peripheral neuropathy: Continue Neurontin    Anemia: Likely multifactorial from CKD and iron deficiency anemia (per PCP note has history of iron deficiency) hemoglobin currently stable and close to usual  baseline , outpatient PCP follow-up and management.    COPD: lungs clear on exam-continue Symbicort, add as needed albuterol.   Depression with anxiety: Appears stable, continue with Celexa   Insulin-dependent type 2 diabetes:   resume home regimen upon discharge       Discharge Condition: Stable  Follow UP  Follow-up Information    Follow up with TSUEI,MATTHEW K., MD. Schedule an appointment as soon as possible for a visit in 3 weeks.   Specialty:  General Surgery   Contact information:   [address] [place] [address] [phone]       Follow up with Chevis Pretty, FNP. Schedule an appointment as soon as possible for a visit in 3 days.   Specialty:  Family Medicine   Contact information:   [place] [address] [phone]        Consults obtained - GI, [facility], Cards  Diet and  Activity recommendation: See Discharge Instructions below  Discharge Instructions       Discharge Instructions    Discharge instructions    Complete by:  As directed   Follow with Primary MD Chevis Pretty, FNP in 7 days   Get CBC, CMP, 2 view Chest X ray checked  by Primary MD next visit.    Activity: As tolerated with Full fall precautions use walker/cane & assistance as needed   Disposition Home     Diet:   Heart Healthy low carbohydrate, low-fat and low-cholesterol diet   Accuchecks 4 times/day, Once in AM empty stomach and then before each meal. Log in all results and show them to your Prim.MD in 3 days. If any glucose reading is under 80 or above 300 call your Prim MD immidiately. Follow Low glucose instructions for glucose under 80 as instructed.   For Heart failure patients - Check your Weight same time everyday, if you gain over 2 pounds, or you develop in leg swelling, experience more shortness of breath or chest pain, call your Primary MD immediately. Follow Cardiac Low Salt Diet and 1.5 lit/day fluid  restriction.   On your next visit with your primary care physician please Get Medicines reviewed and adjusted.   Please request your Prim.MD to go over all Hospital Tests and Procedure/Radiological results at the follow up, please get all Hospital records sent to your Prim MD by signing hospital release before you go home.   If you experience worsening of your admission symptoms, develop shortness of breath, life threatening emergency, suicidal or homicidal thoughts you must seek medical attention immediately by calling 911 or calling your MD immediately  if symptoms less severe.  You Must read complete instructions/literature along with all the possible adverse reactions/side effects for all the Medicines you take and that have been prescribed to you. Take any new Medicines after you have completely understood and accpet all the possible adverse reactions/side effects.   Do not drive, operating heavy machinery, perform activities at heights, swimming or participation in water activities or provide baby sitting services if your were admitted for syncope or siezures until you have seen by Primary MD or a Neurologist and advised to do so again.  Do not drive when taking Pain medications.    Do not take more than prescribed Pain, Sleep and Anxiety Medications  Special Instructions: If you have smoked or chewed Tobacco  in the last 2 yrs please stop smoking, stop any regular Alcohol  and or any Recreational drug use.  Wear Seat belts while driving.   Please note  You were cared for by a hospitalist during your hospital stay. If you have any questions about your discharge medications or the care you received while you were in the hospital after you are discharged, you can call the unit and asked to speak with the hospitalist on call if the hospitalist that took care of you is not available. Once you are discharged, your primary care physician will handle any further medical issues. Please note  that NO REFILLS for any discharge medications will be authorized once you are discharged, as it is imperative that you return to your primary care physician (or establish a relationship with a primary care physician if you do not have one) for your aftercare needs so that they can reassess your need for medications and monitor your lab values.     Increase activity slowly    Complete by:  As directed  Discharge Medications       Medication List    STOP taking these medications        oxyCODONE-acetaminophen 10-325 MG tablet  Commonly known as:  PERCOCET      TAKE these medications        amLODipine 10 MG tablet  Commonly known as:  NORVASC  Take 1 tablet (10 mg total) by mouth daily.     antiseptic oral rinse Liqd  15 mLs by Mouth Rinse route as needed for dry mouth.     aspirin 81 MG tablet  Take 81 mg by mouth daily.     B-D UF III MINI PEN NEEDLES 31G X 5 MM Misc  Generic drug:  Insulin Pen Needle  USE TWICE A DAY TO INJECT NOVOLOG MIX 70/30 INSULIN     EASY TOUCH PEN NEEDLES 31G X 8 MM Misc  Generic drug:  Insulin Pen Needle  USE TWICE DAILY     budesonide-formoterol 80-4.5 MCG/ACT inhaler  Commonly known as:  SYMBICORT  Inhale 1 puff into the lungs 2 (two) times daily.     carvedilol 25 MG tablet  Commonly known as:  COREG  TAKE 1 TABLET BY MOUTH TWICE DAILY WITH A MEAL     citalopram 20 MG tablet  Commonly known as:  CELEXA  TAKE 1 TABLET BY MOUTH EVERY DAY     cloNIDine 0.2 MG tablet  Commonly known as:  CATAPRES  Take 1 tablet (0.2 mg total) by mouth 3 (three) times daily.     CRESTOR 20 MG tablet  Generic drug:  rosuvastatin  TAKE 1 TABLET BY MOUTH EVERY DAY     EASY TOUCH INSULIN SYRINGE 31G X 5/16" 1 ML Misc  Generic drug:  Insulin Syringe-Needle U-100  USE TO INJECT INSULIN TWICE DAILY     febuxostat 40 MG tablet  Commonly known as:  ULORIC  Take 1 tablet (40 mg total) by mouth daily.     ULORIC 40 MG tablet  Generic drug:   febuxostat  TAKE 1 TABLET BY MOUTH ONCE DAILY     fenofibrate 145 MG tablet  Commonly known as:  TRICOR  Take 1 tablet (145 mg total) by mouth daily.     furosemide 40 MG tablet  Commonly known as:  LASIX  Take 1 tablet (40 mg total) by mouth daily.     gabapentin 300 MG capsule  Commonly known as:  NEURONTIN  TAKE 1 CAPSULE BY MOUTH FOUR TIMES A DAY     glimepiride 4 MG tablet  Commonly known as:  AMARYL  TAKE 1/2 TABLET BY MOUTH EVERY DAY BEFORE BREAKFAST     glucose blood test strip  Test 4x a day and prn  Dx E11.9     glucose blood test strip  Commonly known as:  ACCU-CHEK AVIVA PLUS  Test 4-5 x a day and prn dx E11.9     glucose blood test strip  Commonly known as:  ONETOUCH VERIO  Test 4x a day and prn  E11.9     Insulin Lispro Prot & Lispro (75-25) 100 UNIT/ML Kwikpen  Commonly known as:  HUMALOG MIX 75/25 KWIKPEN  55 u in am and 65u in pm     Iron 325 (65 FE) MG Tabs  Take 1 tablet by mouth daily.     isosorbide mononitrate 30 MG 24 hr tablet  Commonly known as:  IMDUR  Take 1 tablet (30 mg total) by mouth daily.     nystatin cream  Commonly known as:  MYCOSTATIN  APPLY TOPICALLY TWO TIMES DAILY.     omeprazole 40 MG capsule  Commonly known as:  PRILOSEC  TAKE 1 CAPSULE BY MOUTH EVERY DAY     PHARMACIST CHOICE ALCOHOL Pads  USE 2 TIMES DAILY FOR DIABETIC TESTING     PHARMACIST CHOICE LANCETS Misc  USE 2 TIMES DAILY FOR DIABETIC TESTING        Major procedures and Radiology Reports - PLEASE review detailed and final reports for all details, in brief -   TTE  - Left ventricle: The cavity size was normal. There was mild concentric hypertrophy. Systolic function was normal. The estimated ejection fraction was in the range of 55% to 60%. Wall motion was normal; there were no regional wall motion abnormalities. Features are consistent with a pseudonormal left ventricular filling pattern, with concomitant abnormal relaxation and increased filling  pressure (grade 2 diastolic dysfunction). - Aortic valve: There was mild stenosis. Valve area (VTI): 1.74 cm^2. Valve area (Vmax): 1.71 cm^2. Valve area (Vmean): 1.56cm^2. - Mitral valve: Severely calcified annulus. Mildly thickened leaflets . The findings are consistent with trivial stenosis. There was mild regurgitation. Valve area by pressure half-time: 2.04 cm^2. Valve area by continuity equation (using LVOT flow): 2.03 cm^2. - Left atrium: The atrium was moderately dilated. - Pulmonary arteries: PA peak pressure: 31 mm Hg (S).    Ven [place]  Bilateral lower extremity venous duplex completed. Bilateral lower extremities are negative for deep vein thrombosis. There is no evidence of Baker's cyst bilaterally.     Ct Abdomen Pelvis Wo Contrast  04/20/2015  CLINICAL DATA:  Post fall now with worsening low back pain. EXAM: CT ABDOMEN AND PELVIS WITHOUT CONTRAST TECHNIQUE: Multidetector CT imaging of the abdomen and pelvis was performed following the standard protocol without IV contrast. COMPARISON:  Lumbar spine MRI - 01/28/2008 FINDINGS: The lack of intravenous contrast limits the ability to evaluate solid abdominal organs. Normal hepatic contour. Radiopaque gallstones and layering sludge is seen within otherwise normal-appearing gallbladder. No definitive gallbladder wall thickening or pericholecystic fluid on this noncontrast examination. No ascites. The hepatic flexure of the colon is incidentally noted to be interposed adjacent to the anterior aspect of the right lobe of the liver. The bilateral kidneys appear mildly atrophic with diffuse cortical thinning. Otherwise, normal noncontrast appearance of the bilateral kidneys. There is a minimal amount of grossly symmetric bilateral perinephric stranding presumably age and body habitus related. No renal stones. No renal stones are seen along the course of either ureter or the urinary bladder. Normal noncontrast appearance of the urinary bladder given  degree distention. No urinary obstruction. Note is made of an approximately 2.9 x 2.2 cm right adrenal gland nodule which appears morphologically similar to the 12/2007 examination. Normal noncontrast appearance of the left adrenal gland, pancreas and spleen. Ingested enteric contrast extends to the level of the proximal descending colon. Scattered colonic diverticulosis without evidence of diverticulitis. No evidence of enteric obstruction. No pneumoperitoneum, pneumatosis or portal venous gas. Large amount of calcified atherosclerotic plaque within a normal caliber abdominal aorta. No bulky retroperitoneal, mesenteric, pelvic or inguinal lymphadenopathy. Normal appearance of the pelvic organs for age. No discrete adnexal lesion. No free fluid in the pelvic cul-de-sac. Limited visualization of the lower thorax is degraded secondary to patient respiratory artifact. No discrete focal airspace opacities. No pleural effusion. Borderline cardiomegaly. Exuberant calcifications within the mitral valve annulus. Coronary artery calcifications. No pericardial effusion. No acute or aggressive osseous abnormalities. Moderate to severe multilevel  thoracolumbar spine DDD, likely worse at L4-L5 and L5-S1 with disc space height loss, endplate irregularity and sclerosis. Stigmata of DISH within the caudal aspect of the thoracic spine. Small mesenteric fat containing periumbilical hernia. Mild diffuse body wall anasarca, most conspicuous about the midline of the low back. Regional soft tissues appear otherwise normal. IMPRESSION: 1. No acute findings within the abdomen or pelvis. Specifically, no evidence of enteric or urinary obstruction. No acute osseus abnormalities. 2. Moderate severe multilevel thoracolumbar spine DDD, progressed since the 2009 examination. 3. 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 4. Mild atrophy of the bilateral kidneys, nonspecific though could be seen in the setting of medical renal disease. Clinical correlation is advised. 5. Colonic diverticulosis without evidence of diverticulitis. 6. Borderline cardiomegaly with exuberant calcifications within the mitral valve annulus. Coronary artery calcifications. 7. Cholelithiasis without evidence of cholecystitis on this noncontrast examination. Electronically Signed   By: Sandi Mariscal M.D.   On: 04/20/2015 15:03   Dg Chest 2 View  04/23/2015  CLINICAL DATA:  Shortness of breath today, slight weakness.  Cough. EXAM: CHEST  2 VIEW COMPARISON:  04/23/2015 FINDINGS: Airspace opacity noted posteriorly on the lateral view concerning for pneumonia, likely within the left lower lobe. No confluent opacity on the right. Cardiomegaly with vascular congestion. No visible effusions or acute bony abnormality. IMPRESSION: Airspace opacity posteriorly on the lateral view, likely left lower lobe pneumonia. Cardiomegaly, vascular congestion. Electronically Signed   By: Rolm Baptise M.D.   On: 04/23/2015 14:22   US Abdomen Complete  04/20/2015  CLINICAL DATA:  Nausea and vomiting for 1 day, history of acute pancreatitis EXAM: ABDOMEN ULTRASOUND COMPLETE COMPARISON:  04/20/2015 FINDINGS: Gallbladder: Well distended with multiple mobile gallstones. No gallbladder wall thickening or pericholecystic fluid is noted. Common bile duct: Diameter: 9.4 mm. Liver: No focal lesion identified. Within normal limits in parenchymal echogenicity. IVC: No abnormality visualized. Pancreas: Not well seen due to overlying bowel gas. Spleen: Size and appearance within normal limits. Right Kidney: Length: 12.5 cm.  Diffuse cortical thinning is noted. Left Kidney: Length: 12.1 cm.  Diffuse cortical thinning is noted. Abdominal aorta: No aneurysm visualized. Other findings: None. IMPRESSION: Cholelithiasis without complicating factors. Electronically Signed   By:  Inez Catalina M.D.   On: 04/20/2015 17:58   Mr Abdomen Mrcp Wo Cm  04/21/2015  CLINICAL DATA:  Abdominal pain.  Nausea. EXAM: MRI ABDOMEN WITHOUT CONTRAST  (INCLUDING MRCP) TECHNIQUE: Multiplanar multisequence MR imaging of the abdomen was performed. Heavily T2-weighted images of the biliary and pancreatic ducts were obtained, and three-dimensional MRCP images were rendered by post processing. COMPARISON:  04/20/2015 and MR from 01/21/2008 FINDINGS: Lower chest: Small pleural effusions and right lower lobe airspace consolidation noted. Hepatobiliary: There is hepatic steatosis noted. No focal liver abnormality identified. Stones noted within the gallbladder. These measure up to 1 cm. There is mild intrahepatic bile duct dilatation. Within the distal common bile duct there is a stone measuring 9 x 6 mm, image 27 of series 4. Pancreas: Negative. Spleen: The spleen is borderline enlarged measuring 13 cm in length. Adrenals/Urinary Tract: Susceptibility artifact is identified within the right adrenal nodule compatible with hemorrhage. Stomach/Bowel: The stomach appears normal. No abnormal dilatation of the visualized upper abdominal bowel loops. Vascular/Lymphatic: Normal appearance of the abdominal aorta. No enlarged upper abdominal lymph nodes. Other: No free fluid or fluid collections identified. Musculoskeletal:  Normal signal from within the bone marrow. IMPRESSION: 1. Gallstones. The common bile duct is increased in caliber and there is a stone within the distal CBD. 2. Hepatic steatosis 3. Right adrenal nodule. Susceptibility artifact noted suggesting previous hemorrhage. This does not appear significantly changed from 01/21/2008 compatible with a benign process. Electronically Signed   By: Kerby Moors M.D.   On: 04/21/2015 14:54   Mr 3d Recon At Scanner  04/21/2015  CLINICAL DATA:  Abdominal pain.  Nausea. EXAM: MRI ABDOMEN WITHOUT CONTRAST  (INCLUDING MRCP) TECHNIQUE: Multiplanar multisequence MR  imaging of the abdomen was performed. Heavily T2-weighted images of the biliary and pancreatic ducts were obtained, and three-dimensional MRCP images were rendered by post processing. COMPARISON:  04/20/2015 and MR from 01/21/2008 FINDINGS: Lower chest: Small pleural effusions and right lower lobe airspace consolidation noted. Hepatobiliary: There is hepatic steatosis noted. No focal liver abnormality identified. Stones noted within the gallbladder. These measure up to 1 cm. There is mild intrahepatic bile duct dilatation. Within the distal common bile duct there is a stone measuring 9 x 6 mm, image 27 of series 4. Pancreas: Negative. Spleen: The spleen is borderline enlarged measuring 13 cm in length. Adrenals/Urinary Tract: Susceptibility artifact is identified within the right adrenal nodule compatible with hemorrhage. Stomach/Bowel: The stomach appears normal. No abnormal dilatation of the visualized upper abdominal bowel loops. Vascular/Lymphatic: Normal appearance of the abdominal aorta. No enlarged upper abdominal lymph nodes. Other: No free fluid or fluid collections identified. Musculoskeletal: Normal signal from within the bone marrow. IMPRESSION: 1. Gallstones. The common bile duct is increased in caliber and there is a stone within the distal CBD. 2. Hepatic steatosis 3. Right adrenal nodule. Susceptibility artifact noted suggesting previous hemorrhage. This does not appear significantly changed from 01/21/2008 compatible with a benign process. Electronically Signed   By: Kerby Moors M.D.   On: 04/21/2015 14:54   Nm Myocar Single W/spect W/wall Motion And Ef  04/24/2015  CLINICAL DATA:  80 year old female with chest pain. Hypertension hyperlipidemia. COPD. EXAM: MYOCARDIAL IMAGING WITH SPECT (PHARMACOLOGIC-STRESS) GATED LEFT VENTRICULAR WALL MOTION STUDY LEFT VENTRICULAR EJECTION FRACTION TECHNIQUE: Intravenous infusion of Lexiscan was performed under the supervision of the Cardiology staff. At  peak effect of the drug, 30 mCi Tc-86m sestamibi was injected intravenously and standard myocardial SPECT imaging was performed. Quantitative gated imaging was also performed to evaluate left ventricular wall motion, and estimate left ventricular ejection fraction. COMPARISON:  None. FINDINGS: Perfusion: No decreased activity in the left ventricle on stress imaging to suggest ischemia or infarction. Wall Motion: Normal left ventricular wall motion. No left ventricular dilation. Left Ventricular Ejection Fraction: 71 % End diastolic volume [id number] ml End systolic volume 33 ml IMPRESSION: 1.  No evidence of ischemia or infarction. 2. Normal left ventricular wall motion. 3. Left ventricular ejection fraction 71% 4. Low-risk stress test findings*. *2012 Appropriate Use Criteria for Coronary Revascularization Focused Update: J Am Coll Cardiol. [id number]. http://content.airportbarriers.com.aspx?articleid=[email or web address] Electronically Signed   By: Suzy Bouchard M.D.   On: 04/24/2015 13:40   Dg Chest Port 1 View  04/23/2015  CLINICAL DATA:  80 year old with 3 day history of shortness of breath and nonproductive cough. Current history of hypertension, diabetes, COPD and stage 4 chronic kidney disease. EXAM: PORTABLE CHEST 1 VIEW COMPARISON:  04/22/2015 and earlier. FINDINGS: Markedly suboptimal inspiration. No significant change in the airspace opacities in the right lung base and right upper lobe. Pulmonary venous hypertension without overt edema, unchanged. Developing airspace consolidation in the medial left lung  base. IMPRESSION: Markedly suboptimal inspiration. Stable pneumonia involving the right upper lobe and right lung base. Developing pneumonia at the left lung base. Electronically Signed   By: Evangeline Dakin M.D.   On: 04/23/2015 08:10   Dg Chest Port 1 View  04/23/2015  CLINICAL DATA:  Hypoxia. EXAM: PORTABLE CHEST 1 VIEW COMPARISON:  04/21/2015 FINDINGS: Cardiomediastinal silhouette is enlarged.  Mediastinal contours appear intact. The aorta is torturous and contains atherosclerotic calcifications There is no evidence of pleural effusion or pneumothorax. There is opacification of the right lower lobe. More subtle airspace consolidation is also seen in the left cardiophrenic angle. Osseous structures are without acute abnormality. Soft tissues are grossly normal. IMPRESSION: Enlarged cardiac silhouette. Large area of airspace opacification in the right lower lobe, and more subtle opacification in the left lower lobe. Differential diagnosis includes multifocal pneumonia or less likely pulmonary edema. An area of more pronounced increased density in the right mid lung, with which pulmonary mass cannot be excluded. Electronically Signed   By: Fidela Salisbury M.D.   On: 04/23/2015 00:49   Dg Chest Port 1 View  04/20/2015  CLINICAL DATA:  Fever and nausea EXAM: PORTABLE CHEST 1 VIEW COMPARISON:  PA and lateral chest x-ray of January 28, 2008 FINDINGS: The lungs are adequately inflated. There are coarse lung markings in the left retrocardiac region. There is no pleural effusion. The cardiac silhouette is enlarged. The pulmonary vascularity is not engorged. There is increased soft tissue density in the left suprahilar region lying lateral to the aortic arch. The trachea is midline. The bony thorax exhibits no acute abnormality. IMPRESSION: Stable enlargement cardiac silhouette without significant pulmonary vascular congestion. Increased prominence of the soft tissues in the left hilar and suprahilar regions may be in part due to projection. However, atelectasis or infiltrate in the right upper lobe is not excluded. If the patient can tolerate the procedure, a PA and lateral chest x-ray would be useful. Electronically Signed   By: David  Martinique M.D.   On: 04/20/2015 09:33   Dg Ercp Biliary & Pancreatic Ducts  04/22/2015  CLINICAL DATA:  Gallstone. EXAM: ERCP TECHNIQUE: Multiple spot images obtained with  the fluoroscopic device and submitted for interpretation post-procedure. FLUOROSCOPY TIME:  If the device does not provide the exposure index: Fluoroscopy Time:  2 minutes and 36 seconds Number of Acquired Images:  3 COMPARISON:  MRI 04/21/2015 FINDINGS: Wire was advanced into the common bile duct. Evidence for a sphincterotomy procedure. Contrast opacification of the biliary system demonstrates a dilated common bile duct. No definite filling defects in the common bile duct. IMPRESSION: Dilated common bile duct. These images were submitted for radiologic interpretation only. Please see the procedural report for the amount of contrast and the fluoroscopy time utilized. Electronically Signed   By: Markus Daft M.D.   On: 04/22/2015 19:32    Micro Results      Recent Results (from the past 240 hour(s))  Blood Culture (routine x 2)     Status: None (Preliminary result)   Collection Time: 04/20/15  8:50 AM  Result Value Ref Range Status   Specimen Description BLOOD RIGHT ANTECUBITAL  Final   Special Requests BOTTLES DRAWN AEROBIC AND ANAEROBIC 10MLS  Final   Culture NO GROWTH 4 DAYS  Final   Report Status PENDING  Incomplete  Blood Culture (routine x 2)     Status: None (Preliminary result)   Collection Time: 04/20/15  9:05 AM  Result Value Ref Range Status   Specimen Description  BLOOD RIGHT ANTECUBITAL  Final   Special Requests BOTTLES DRAWN AEROBIC AND ANAEROBIC 10MLS  Final   Culture NO GROWTH 4 DAYS  Final   Report Status PENDING  Incomplete  Urine culture     Status: None   Collection Time: 04/20/15 11:11 AM  Result Value Ref Range Status   Specimen Description URINE, CATHETERIZED  Final   Special Requests NONE  Final   Culture   Final    >=100,000 COLONIES/mL GROUP B STREP(S.AGALACTIAE)ISOLATED TESTING AGAINST S. AGALACTIAE NOT ROUTINELY PERFORMED DUE TO PREDICTABILITY OF AMP/PEN/VAN SUSCEPTIBILITY.    Report Status 04/21/2015 FINAL  Final     Today   Subjective    Shaliya Tribett today has no headache,no chest abdominal pain,no new weakness tingling or numbness, feels much better wants to go home today.    Objective   Blood pressure 178/42, pulse 66, temperature 97.6 F (36.4 C), temperature source Oral, resp. rate 16, height 5\' 4"  (1.626 m), weight 102.8 kg (226 lb 10.1 oz), SpO2 97 %.   Intake/Output Summary (Last 24 hours) at 04/25/15 0910 Last data filed at 04/25/15 0353  Gross per 24 hour  Intake    240 ml  Output   1200 ml  Net   -960 ml    Exam Awake Alert, Oriented x 3, No new F.N deficits, Normal affect [place].AT,PERRAL Supple Neck,No JVD, No cervical lymphadenopathy appriciated.  Symmetrical Chest wall movement, Good air movement bilaterally, CTAB RRR,No Gallops,Rubs or new Murmurs, No Parasternal Heave +ve B.Sounds, Abd Soft, Non tender, No organomegaly appriciated, No rebound -guarding or rigidity. No Cyanosis, Clubbing, trace edema, No new Rash or bruise   Data Review   CBC w Diff: Lab Results  Component Value Date   WBC 7.2 04/24/2015   WBC 9.2 02/26/2015   WBC 5.5 11/26/2014   HGB 10.7* 04/24/2015   HGB 9.9* 04/15/2015   HCT 35.9* 04/24/2015   HCT 28.7* 02/26/2015   HCT 34.0* 11/26/2014   PLT 190 04/24/2015   LYMPHOPCT 24.4 10/08/2009   MONOPCT 7.3 10/08/2009   EOSPCT 1.6 10/08/2009   BASOPCT 0.4 10/08/2009    CMP: Lab Results  Component Value Date   NA 141 04/24/2015   NA 141 02/26/2015   K 3.9 04/24/2015   CL 106 04/24/2015   CO2 24 04/24/2015   BUN 48* 04/24/2015   BUN 50* 02/26/2015   CREATININE 1.79* 04/24/2015   CREATININE 2.11* 11/13/2012   PROT 6.4* 04/23/2015   PROT 6.3 02/26/2015   ALBUMIN 2.5* 04/23/2015   ALBUMIN 3.7 02/26/2015   BILITOT 3.6* 04/23/2015   BILITOT 0.6 02/26/2015   ALKPHOS 75 04/23/2015   AST 225* 04/23/2015   ALT 126* 04/23/2015  .   Total Time in preparing paper work, data evaluation and todays exam - 35 minutes  Thurnell Lose M.D on 04/25/2015 at 9:10 AM  [facility]   Office  [phone]

## 2015-04-25 NOTE — Progress Notes (Signed)
Reviewed discharge instructions with family and pt.  Prescriptions given.  PIV removed.  Pt denied any needs at this time.  Pt taken to discharge location via wheelchair.

## 2015-04-29 ENCOUNTER — Other Ambulatory Visit: Payer: Self-pay | Admitting: Nurse Practitioner

## 2015-04-30 ENCOUNTER — Encounter: Payer: Self-pay | Admitting: Nurse Practitioner

## 2015-04-30 ENCOUNTER — Ambulatory Visit (INDEPENDENT_AMBULATORY_CARE_PROVIDER_SITE_OTHER): Payer: Medicare Other | Admitting: Nurse Practitioner

## 2015-04-30 VITALS — BP 136/88 | HR 59 | Temp 97.2°F | Ht 64.0 in | Wt 230.3 lb

## 2015-04-30 DIAGNOSIS — Z23 Encounter for immunization: Secondary | ICD-10-CM | POA: Diagnosis not present

## 2015-04-30 DIAGNOSIS — D509 Iron deficiency anemia, unspecified: Secondary | ICD-10-CM

## 2015-04-30 DIAGNOSIS — K8001 Calculus of gallbladder with acute cholecystitis with obstruction: Secondary | ICD-10-CM | POA: Diagnosis not present

## 2015-04-30 DIAGNOSIS — Z09 Encounter for follow-up examination after completed treatment for conditions other than malignant neoplasm: Secondary | ICD-10-CM | POA: Diagnosis not present

## 2015-04-30 LAB — POCT HEMOGLOBIN: Hemoglobin: 12 g/dL — AB (ref 12.2–16.2)

## 2015-04-30 MED ORDER — OXYCODONE-ACETAMINOPHEN 10-325 MG PO TABS
1.0000 | ORAL_TABLET | Freq: Two times a day (BID) | ORAL | Status: DC
Start: 2015-04-30 — End: 2015-06-22

## 2015-04-30 NOTE — Patient Instructions (Signed)

## 2015-04-30 NOTE — Addendum Note (Signed)
Addended by: Chevis Pretty on: 04/30/2015 11:39 AM   Modules accepted: Orders

## 2015-04-30 NOTE — Progress Notes (Signed)
   Subjective:    Patient ID: Anna [name], female    DOB: [birth year], 80 y.o.   MRN: [id number]  HPI Patient here today for hospital follow up- she was in the hospital over [place] with pancreatitis from gallstones lodged in her bile duct. They did an ERCP and had stone removal and inserted a stent- has appoitnment with surgeon Tuesday to schedule gallbladder removal. SHe is doing much better- watching what she eats so as not to flare up gallbladder.    Review of Systems  Constitutional: Negative.   HENT: Negative.   Respiratory: Negative.   Cardiovascular: Negative.   Genitourinary: Negative.   Neurological: Negative.   Psychiatric/Behavioral: Negative.   All other systems reviewed and are negative.      Objective:   Physical Exam  Constitutional: She is oriented to person, place, and time. She appears well-developed and well-nourished.  Cardiovascular: Normal rate, regular rhythm and normal heart sounds.   Pulmonary/Chest: Effort normal and breath sounds normal.  Abdominal: Soft.  Neurological: She is alert and oriented to person, place, and time.  Skin: Skin is warm.  Psychiatric: She has a normal mood and affect. Her behavior is normal. Judgment and thought content normal.    BP 136/88 mmHg  Pulse 59  Temp(Src) 97.2 F (36.2 C) (Oral)  Ht 5\' 4"  (1.626 m)  Wt 230 lb 4.8 oz (104.463 kg)  BMI 39.51 kg/m2  SpO2 96%        Assessment & Plan:  1. Hospital discharge follow-up Hospital records reviewed  2. Calculus of gallbladder with acute cholecystitis and obstruction Keep appointment with surgeon  3. Anemia, iron deficiency - POCT hemoglobin   Mary-Margaret Hassell Done, FNP

## 2015-05-02 DIAGNOSIS — J189 Pneumonia, unspecified organism: Secondary | ICD-10-CM

## 2015-05-02 HISTORY — DX: Pneumonia, unspecified organism: J18.9

## 2015-05-04 ENCOUNTER — Other Ambulatory Visit: Payer: Self-pay | Admitting: Surgery

## 2015-05-04 ENCOUNTER — Telehealth: Payer: Self-pay | Admitting: Nurse Practitioner

## 2015-05-04 DIAGNOSIS — K8020 Calculus of gallbladder without cholecystitis without obstruction: Secondary | ICD-10-CM | POA: Diagnosis not present

## 2015-05-04 NOTE — Telephone Encounter (Signed)
FYI

## 2015-05-06 ENCOUNTER — Other Ambulatory Visit: Payer: Self-pay | Admitting: Nurse Practitioner

## 2015-05-11 ENCOUNTER — Encounter: Payer: Self-pay | Admitting: Nurse Practitioner

## 2015-05-12 ENCOUNTER — Telehealth: Payer: Self-pay | Admitting: Nurse Practitioner

## 2015-05-12 NOTE — Telephone Encounter (Signed)
Anna [name] has been in hospital.  Daughter will contact [facility].

## 2015-05-12 NOTE — Telephone Encounter (Signed)
Daughter aware,  Must have an appointment to have a face to face for a form to be done for a new hover round. Her old one needs a repair.

## 2015-05-14 ENCOUNTER — Encounter: Payer: Self-pay | Admitting: Nurse Practitioner

## 2015-05-14 ENCOUNTER — Other Ambulatory Visit: Payer: Self-pay | Admitting: Nurse Practitioner

## 2015-05-14 ENCOUNTER — Ambulatory Visit (INDEPENDENT_AMBULATORY_CARE_PROVIDER_SITE_OTHER): Payer: Medicare Other | Admitting: Nurse Practitioner

## 2015-05-14 VITALS — BP 149/58 | HR 62 | Temp 99.2°F | Ht 64.0 in | Wt 231.0 lb

## 2015-05-14 DIAGNOSIS — R233 Spontaneous ecchymoses: Secondary | ICD-10-CM

## 2015-05-14 NOTE — Progress Notes (Signed)
   Subjective:    Patient ID: Anna [name], female    DOB: 1934/09/06, 81 y.o.   MRN: [id number]  HPI Patient brought in by daughter with C/O rash- rash started 2 weeks ago- does not itch or hurt. SHe has beenon amlodipine clonidine and isosorbide since discharge from hospital along with her other meds.    Review of Systems  Constitutional: Negative.   HENT: Negative.   Respiratory: Negative.   Cardiovascular: Negative.   Genitourinary: Negative.   Neurological: Negative.   Psychiatric/Behavioral: Negative.   All other systems reviewed and are negative.      Objective:   Physical Exam  Constitutional: She appears well-developed and well-nourished.  Cardiovascular: Regular rhythm and normal heart sounds.   Pulmonary/Chest: Effort normal.  Neurological: She is alert.  Skin:  Petechiae all over body    Psychiatric: She has a normal mood and affect. Her behavior is normal. Judgment and thought content normal.  BP 149/58 mmHg  Pulse 62  Temp(Src) 99.2 F (37.3 C) (Oral)  Ht 5\' 4"  (1.626 m)  Wt 231 lb (104.781 kg)  BMI 39.63 kg/m2          Assessment & Plan:  1. Petechiae Will wait on labs to come back - CBC with Differential/Platelet   Mary-Margaret Hassell Done, FNP

## 2015-05-15 LAB — CBC WITH DIFFERENTIAL/PLATELET
BASOS: 0 %
Basophils Absolute: 0 10*3/uL (ref 0.0–0.2)
EOS (ABSOLUTE): 0.1 10*3/uL (ref 0.0–0.4)
Eos: 2 %
HEMATOCRIT: 32.4 % — AB (ref 34.0–46.6)
Hemoglobin: 10.2 g/dL — ABNORMAL LOW (ref 11.1–15.9)
Immature Grans (Abs): 0 10*3/uL (ref 0.0–0.1)
Immature Granulocytes: 0 %
LYMPHS ABS: 1 10*3/uL (ref 0.7–3.1)
Lymphs: 17 %
MCH: 28.7 pg (ref 26.6–33.0)
MCHC: 31.5 g/dL (ref 31.5–35.7)
MCV: 91 fL (ref 79–97)
MONOS ABS: 0.5 10*3/uL (ref 0.1–0.9)
Monocytes: 7 %
NEUTROS ABS: 4.7 10*3/uL (ref 1.4–7.0)
NEUTROS PCT: 74 %
PLATELETS: 252 10*3/uL (ref 150–379)
RBC: 3.56 x10E6/uL — ABNORMAL LOW (ref 3.77–5.28)
RDW: 16.7 % — AB (ref 12.3–15.4)
WBC: 6.3 10*3/uL (ref 3.4–10.8)

## 2015-05-17 ENCOUNTER — Other Ambulatory Visit: Payer: Self-pay | Admitting: Nurse Practitioner

## 2015-05-17 ENCOUNTER — Other Ambulatory Visit: Payer: Self-pay | Admitting: *Deleted

## 2015-05-17 DIAGNOSIS — D649 Anemia, unspecified: Secondary | ICD-10-CM

## 2015-05-17 DIAGNOSIS — R233 Spontaneous ecchymoses: Secondary | ICD-10-CM

## 2015-05-17 NOTE — Progress Notes (Signed)
Patient's family aware.

## 2015-05-18 ENCOUNTER — Encounter (HOSPITAL_COMMUNITY)
Admission: RE | Admit: 2015-05-18 | Discharge: 2015-05-18 | Disposition: A | Discharge status: Home / Self Care | Payer: Medicare Other | Source: Ambulatory Visit | Attending: Surgery | Admitting: Surgery

## 2015-05-18 ENCOUNTER — Encounter (HOSPITAL_COMMUNITY): Payer: Self-pay

## 2015-05-18 DIAGNOSIS — Z01812 Encounter for preprocedural laboratory examination: Secondary | ICD-10-CM | POA: Diagnosis not present

## 2015-05-18 DIAGNOSIS — I5030 Unspecified diastolic (congestive) heart failure: Secondary | ICD-10-CM | POA: Insufficient documentation

## 2015-05-18 DIAGNOSIS — Z79899 Other long term (current) drug therapy: Secondary | ICD-10-CM | POA: Diagnosis not present

## 2015-05-18 DIAGNOSIS — Z01818 Encounter for other preprocedural examination: Secondary | ICD-10-CM | POA: Insufficient documentation

## 2015-05-18 DIAGNOSIS — K851 Biliary acute pancreatitis without necrosis or infection: Secondary | ICD-10-CM | POA: Insufficient documentation

## 2015-05-18 DIAGNOSIS — E1122 Type 2 diabetes mellitus with diabetic chronic kidney disease: Secondary | ICD-10-CM | POA: Insufficient documentation

## 2015-05-18 DIAGNOSIS — J449 Chronic obstructive pulmonary disease, unspecified: Secondary | ICD-10-CM | POA: Diagnosis not present

## 2015-05-18 DIAGNOSIS — Z794 Long term (current) use of insulin: Secondary | ICD-10-CM | POA: Insufficient documentation

## 2015-05-18 DIAGNOSIS — I130 Hypertensive heart and chronic kidney disease with heart failure and stage 1 through stage 4 chronic kidney disease, or unspecified chronic kidney disease: Secondary | ICD-10-CM | POA: Diagnosis not present

## 2015-05-18 DIAGNOSIS — N184 Chronic kidney disease, stage 4 (severe): Secondary | ICD-10-CM | POA: Diagnosis not present

## 2015-05-18 DIAGNOSIS — K219 Gastro-esophageal reflux disease without esophagitis: Secondary | ICD-10-CM | POA: Insufficient documentation

## 2015-05-18 DIAGNOSIS — Z7982 Long term (current) use of aspirin: Secondary | ICD-10-CM | POA: Diagnosis not present

## 2015-05-18 DIAGNOSIS — E538 Deficiency of other specified B group vitamins: Secondary | ICD-10-CM | POA: Diagnosis not present

## 2015-05-18 DIAGNOSIS — Z87891 Personal history of nicotine dependence: Secondary | ICD-10-CM | POA: Diagnosis not present

## 2015-05-18 HISTORY — DX: Intervertebral disc disorders with myelopathy, thoracic region: M51.04

## 2015-05-18 LAB — CBC
HEMATOCRIT: 34.4 % — AB (ref 36.0–46.0)
Hemoglobin: 10.6 g/dL — ABNORMAL LOW (ref 12.0–15.0)
MCH: 28.7 pg (ref 26.0–34.0)
MCHC: 30.8 g/dL (ref 30.0–36.0)
MCV: 93.2 fL (ref 78.0–100.0)
PLATELETS: 254 10*3/uL (ref 150–400)
RBC: 3.69 MIL/uL — ABNORMAL LOW (ref 3.87–5.11)
RDW: 16.3 % — AB (ref 11.5–15.5)
WBC: 7.1 10*3/uL (ref 4.0–10.5)

## 2015-05-18 LAB — BASIC METABOLIC PANEL
ANION GAP: 12 (ref 5–15)
BUN: 55 mg/dL — ABNORMAL HIGH (ref 6–20)
CALCIUM: 8.9 mg/dL (ref 8.9–10.3)
CO2: 21 mmol/L — AB (ref 22–32)
Chloride: 107 mmol/L (ref 101–111)
Creatinine, Ser: 2.17 mg/dL — ABNORMAL HIGH (ref 0.44–1.00)
GFR, EST AFRICAN AMERICAN: 24 mL/min — AB (ref >60)
GFR, EST NON AFRICAN AMERICAN: 21 mL/min — AB (ref >60)
Glucose, Bld: 77 mg/dL (ref 65–99)
Potassium: 4.8 mmol/L (ref 3.5–5.1)
Sodium: 140 mmol/L (ref 135–145)

## 2015-05-18 NOTE — Progress Notes (Addendum)
All started when she fell at home 12/22, started getting SOB, so she was brought to [place].  Work up on heart showed pancreatitis, and gallstones.  PCP is Shelah Lewandowsky @ [address] Saw Dr. Recardo Evangelist and [place], [place] when at [facility].   Echo done 04/2015 Nuclear perfusion scan 04/2015.  (Notes from visit inside chart) She denies every being tested for OSA. Morning sugars run 89-99. Type ll   Last Hbg A1C was in 03/2016 Patient has rash over most of body.  She states it started when she was placed on new meds after the dec. Visit.  Has seen her PCP, who gave her Nystatin cream.  I called [facility] office and patient will be going over to office for Dr. Ninfa Linden to look at.  Not raised nor draining, or itching but is even on her abdomen.

## 2015-05-18 NOTE — Pre-Procedure Instructions (Signed)
Anna [name]  05/18/2015      [facility], [place], [place] [address] [place] [id number] [place] [address] Phone: [phone] Fax: [phone]  [facility], [place], [place] [address] Phone: [phone] Fax: [place], [place] [address] Phone: [phone] Fax: [phone]    Your procedure is scheduled on : January 24th, Tuesday   Report to [facility] Admitting at 5:30 am             (Surgery time 7:30 am - 8:45 am)   Call this number if you have problems the morning of surgery:  [phone]   Remember:  Do not eat food or drink liquids after midnight Monday.  Take these medicines the morning of surgery with A SIP OF WATER : Norvasc, Coreg, Celexa, Omeprazole.              (STOP taking any aspirin, anti-inflammatories, herbal supplements or vitamins 4-5 days prior                  How to Manage Your Diabetes Before Surgery                  Why is it important to control my blood sugar before and after surgery?   Improving blood sugar levels before and after surgery helps healing and can limit problems.  A way of improving blood sugar control is eating a healthy diet by:  - Eating less sugar and carbohydrates  - Increasing activity/exercise  - Talk with your doctor about reaching your blood sugar goals  High blood sugars (greater than 180 mg/dL) can raise your risk of infections and slow down your recovery so you will need to focus on controlling your diabetes during the weeks before surgery.  Make sure that the doctor who takes care of your diabetes knows about your planned surgery including the date and location.  How do I manage my blood sugars before surgery?   Check your blood sugar at least 4 times a day, 2 days before surgery to make sure that they are not too high or  low.   Check your blood sugar the morning of your surgery when you wake up and every 2   hours until you get to the Short-Stay unit.    If your blood sugar is less than 70 mg/dL, you will need to treat for low blood sugar by:  Treat a low blood sugar (less than 70 mg/dL) with 1/2 cup of clear juice (cranberry or apple), 4 glucose tablets, OR glucose gel.    Recheck blood sugar in 15 minutes after treatment (to make sure it is greater than 70 mg/dL).  If blood sugar is not greater than 70 mg/dL on re-check, call [phone] for further instructions.   Report your blood sugar to the Short-Stay nurse when you get to Short-Stay.  References:  [facility], 2007 "How to Manage your Diabetes Before and After Surgery".  What do I do about my diabetes medications?   Do not take oral diabetes medicines (pills) the morning of surgery.    THE NIGHT BEFORE SURGERY, take 45 units of  Humolog Mix 75/25    THE MORNING OF SURGERY, take 0 units of insulin    Do not take other diabetes injectables  the day of surgery including Byetta, Victoza, Bydureon, and Trulicity.    If your CBG is greater than 220 mg/dL, you may take 1/2 of your sliding scale (correction) dose of insulin.     Do not wear jewelry, make-up or nail polish.  Do not wear lotions, powders, or perfumes.  You may NOT wear deodorant the day of surgery.  Do not shave underarms & legs 48 hours prior to surgery.     Do not bring valuables to the hospital.  [facility] is not responsible for any belongings or valuables.  Contacts, dentures or bridgework may not be worn into surgery.  Leave your suitcase in the car.  After surgery it may be brought to your room.  For patients admitted to the hospital, discharge time will be determined by your treatment team. Patients discharged the day of surgery will not be allowed to drive home.   Name and phone number of your driver:    Please read over the  following fact sheets that you were given. Pain Booklet, Coughing and Deep Breathing and Surgical Site Infection Prevention

## 2015-05-18 NOTE — Progress Notes (Signed)
   05/18/15 1324  OBSTRUCTIVE SLEEP APNEA  Have you ever been diagnosed with sleep apnea through a sleep study? No (patient denies every being tested)  If yes, do you have and use a CPAP or BPAP machine every night? 0  Do you snore loudly (loud enough to be heard through closed doors)?  0  Do you often feel tired, fatigued, or sleepy during the daytime (such as falling asleep during driving or talking to someone)? 0  Has anyone observed you stop breathing during your sleep? 0  Do you have, or are you being treated for high blood pressure? 1  BMI more than 35 kg/m2? 1  Age > 50 (1-yes) 1  Neck circumference greater than:Female 16 inches or larger, Female 17inches or larger? 1  Female Gender (Yes=1) 0  Obstructive Sleep Apnea Score 4  Score 5 or greater  Results sent to PCP

## 2015-05-19 ENCOUNTER — Encounter (HOSPITAL_COMMUNITY): Payer: Self-pay

## 2015-05-19 LAB — GLUCOSE, CAPILLARY: GLUCOSE-CAPILLARY: 66 mg/dL (ref 65–99)

## 2015-05-19 NOTE — Progress Notes (Signed)
Anesthesia Chart Review:  Pt is 81 year old female scheduled for laparoscopic cholecystectomy on 05/25/2015 with Dr. Evlyn Courier.   PMH includes:  Diastolic HF, HTN, DM, hyperlipidemia, CKD (stage IV), COPD, anemia, Vitamin B12 deficiency, OSA (not on CPAP), GERD. Former smoker. BMI 39. S/p ERCP 04/22/15. S/p EGD 10/19/14.   Medications include: amlodipine, ASA, symbicort, carvedilol, clonidine, fenofibrate, iron, lasix, novolog, imdur, prilosec, crestor.   Preoperative labs reviewed.  Cr 2.17, BUN 55. Pt has stage IV CKD. Prior Cr results over past year range from 1.68-2.25.   Chest x-ray 04/23/15 reviewed. Airspace opacity posteriorly on the lateral view, likely left lower lobe pneumonia. Cardiomegaly, vascular congestion.  EKG 04/20/15: Sinus rhythm. Borderline prolonged PR interval  Nuclear stress test 04/25/15:  1. No evidence of ischemia or infarction. 2. Normal left ventricular wall motion. 3. Left ventricular ejection fraction 71% 4. Low-risk stress test findings  Echo 04/21/15:  - Left ventricle: The cavity size was normal. There was mild concentric hypertrophy. Systolic function was normal. The estimated ejection fraction was in the range of 55% to 60%. Wall motion was normal; there were no regional wall motion abnormalities. Features are consistent with a pseudonormal left ventricular filling pattern, with concomitant abnormal relaxation and increased filling pressure (grade 2 diastolic dysfunction). - Aortic valve: There was mild stenosis. Valve area (VTI): 1.74 cm^2. Valve area (Vmax): 1.71 cm^2. Valve area (Vmean): 1.56 cm^2. - Mitral valve: Severely calcified annulus. Mildly thickened leaflets . The findings are consistent with trivial stenosis. There was mild regurgitation. Valve area by pressure half-time: 2.04 cm^2. Valve area by continuity equation (using LVOT flow):2.03 cm^2. - Left atrium: The atrium was moderately dilated. - Pulmonary arteries: PA peak pressure: 31 mm  Hg (S).  If no changes, I anticipate pt can proceed with surgery as scheduled.   Willeen Cass, FNP-BC [facility]/Anesthesiology Phone: [phone] 05/19/2015 10:21 AM

## 2015-05-24 MED ORDER — CEFAZOLIN SODIUM-DEXTROSE 2-3 GM-% IV SOLR
2.0000 g | INTRAVENOUS | Status: AC
  Administered 2015-05-25: 2 g via INTRAVENOUS
  Filled 2015-05-24: qty 50

## 2015-05-24 NOTE — Progress Notes (Signed)
[name] called to ask about pt's insulin and other medications.  She stated that pt stopped her gout medicine and her gout is flaring up.  After reviewing pt's record, instructed her to continue her gout medicine through the day before surgery. Also reviewed medicines to take the day of surgery and insulin Instructions with Kieth Brightly,  Patient"s daughter.  Kieth Brightly stated that patient no longer takes coreg, but now takes clonidine,  I instructed her to take that one the morning of surgery and to bring in the bottle for verification. Kieth Brightly states pt is taking humolog 72/25 and confirmed that pt  normally  Takes 65 units at night so she should take 45 units the night before surgery and no insulin the morning of surgery. Kieth Brightly voiced understanding.

## 2015-05-24 NOTE — H&P (Signed)
Anna [name]. Aken 05/04/2015 2:51 PM Location: [facility] Patient #: [id number] DOB: [birth year] Widowed / Language: Anna [name] / Race: White Female   History of Present Illness (Anna [name]. Anna Linden MD; 05/04/2015 3:12 PM) Patient words: New-Gallbladder.  The patient is [name] 81 year old female who presents with symptomatic choledocholithiasis. She is here for [name] hospital follow-up. She was recently admitted with gallstone pancreatitis. During that hospitalization, she had an ERCP with stone removal. Because of her chronic comorbidities, surgery was deferred. During her admission, cardiology saw her and her last ejection fraction was 71%. Since going home, she has felt well. She has had minimal shortness of breath and no abdominal pain. She is eating well.    Other Problems Anna [name], CMA; 05/04/2015 2:51 PM) Arthritis Back Pain Chest pain Cholelithiasis Chronic Obstructive Lung Disease Chronic Renal Failure Syndrome Diabetes Mellitus Hemorrhoids High blood pressure Hypercholesterolemia Lump In Breast Pancreatitis  Past Surgical History Anna [name], CMA; 05/04/2015 2:51 PM) Breast Biopsy Left. Cataract Surgery Bilateral. Colon Polyp Removal - Colonoscopy  Diagnostic Studies History Anna [name], CMA; 05/04/2015 2:51 PM) Colonoscopy 1-5 years ago Mammogram >3 years ago Pap Smear never  Allergies Anna [name], CMA; 05/04/2015 2:52 PM) ACE Inhibitors Niaspan *ANTIHYPERLIPIDEMICS*  Medication History (Anna [name], CMA; 05/04/2015 2:58 PM) AmLODIPine Besylate (10MG  Tablet, Oral) Active. Aspirin (81MG  Tablet, Oral) Active. Symbicort (80-4.5MCG/ACT Aerosol, Inhalation) Active. Carvedilol (25MG  Tablet, Oral) Active. Citalopram Hydrobromide (20MG  Tablet, Oral) Active. CloNIDine HCl (0.2MG  Tablet, Oral) Active. Uloric (40MG  Tablet, Oral) Active. Fenofibrate (145MG  Tablet, Oral) Active. Ferrous Sulfate (325 (65 Fe)MG Tablet, Oral)  Active. Furosemide (40MG  Tablet, Oral) Active. Gabapentin (300MG  Capsule, Oral) Active. HumaLOG Mix 75/25 KwikPen ((75-25) 100UNIT/ML Suspension, Subcutaneous) Active. Isosorbide Mononitrate (30MG  Tablet ER 24HR, Oral) Active. Omeprazole (40MG  Capsule DR, Oral) Active. Crestor (20MG  Tablet, Oral) Active. Medications Reconciled  Social History Anna [name], CMA; 05/04/2015 2:51 PM) Caffeine use Coffee, Tea. No alcohol use No drug use  Family History Anna [name], CMA; 05/04/2015 2:51 PM) Diabetes Mellitus Daughter, Father, Sister, Son. Heart Disease Father. Hypertension Daughter, Father, Sister, Son.  Pregnancy / Birth History Anna [name], CMA; 05/04/2015 2:51 PM) Age at menarche 53 years. Age of menopause 58-50 Gravida 20 Maternal age <15 Para 13    Review of Systems Anna [name] CMA; 05/04/2015 2:51 PM) General Present- Fatigue and Weight Loss. Not Present- Appetite Loss, Chills, Fever, Night Sweats and Weight Gain. Skin Present- Jaundice and Rash. Not Present- Change in Wart/Mole, Dryness, Hives, New Lesions, Non-Healing Wounds and Ulcer. HEENT Not Present- Earache, Hearing Loss, Hoarseness, Nose Bleed, Oral Ulcers, Ringing in the Ears, Seasonal Allergies, Sinus Pain, Sore Throat, Visual Disturbances, Wears glasses/contact lenses and Yellow Eyes. Respiratory Present- Difficulty Breathing and Snoring. Not Present- Bloody sputum, Chronic Cough and Wheezing. Breast Not Present- Breast Mass, Breast Pain, Nipple Discharge and Skin Changes. Cardiovascular Present- Leg Cramps and Swelling of Extremities. Not Present- Chest Pain, Difficulty Breathing Lying Down, Palpitations, Rapid Heart Rate and Shortness of Breath. Gastrointestinal Present- Change in Bowel Habits. Not Present- Abdominal Pain, Bloating, Bloody Stool, Chronic diarrhea, Constipation, Difficulty Swallowing, Excessive gas, Gets full quickly at meals, Hemorrhoids, Indigestion, Nausea, Rectal Pain and  Vomiting. Female Genitourinary Not Present- Frequency, Nocturia, Painful Urination, Pelvic Pain and Urgency. Musculoskeletal Present- Back Pain, Joint Pain, Muscle Pain, Muscle Weakness and Swelling of Extremities. Not Present- Joint Stiffness. Neurological Present- Trouble walking and Weakness. Not Present- Decreased Memory, Fainting, Headaches, Numbness, Seizures, Tingling and Tremor. Psychiatric Not Present- Anxiety, Bipolar, Change in Sleep Pattern, Depression, Fearful and Frequent crying.  Endocrine Not Present- Cold Intolerance, Excessive Hunger, Hair Changes, Heat Intolerance, Hot flashes and New Diabetes. Hematology Not Present- Easy Bruising, Excessive bleeding, Gland problems, HIV and Persistent Infections.  Vitals (Anna [name] CMA; 05/04/2015 2:51 PM) 05/04/2015 2:51 PM Height: 61in Temp.: 98.7F(Oral)  BP: 128/62 (Sitting, Left Arm, Standard)       Physical Exam (Juri Dinning [name]. Anna Linden MD; 05/04/2015 3:13 PM) The physical exam findings are as follows: Note:Lungs are clear bilaterally Cardiovascular regular rate and rhythm Abdomen is soft and nontender    Assessment & Plan (Klea Nall [name]. Anna Linden MD; 05/04/2015 3:14 PM) SYMPTOMATIC CHOLELITHIASIS (K80.20) Impression: I again explained the diagnosis to the patient and her daughters. Because of her significant episode, laparoscopic cholecystectomy is recommended. She already has been cleared from [name] cardiology standpoint. I discussed the surgery with them in detail and gave him literature regarding cholecystectomy. I discussed the risks in detail. Surgery will be scheduled Current Plans Pt Education - Pamphlet Given - Laparoscopic Gallbladder Surgery: discussed with patient and provided information.

## 2015-05-25 ENCOUNTER — Encounter (HOSPITAL_COMMUNITY): Payer: Self-pay

## 2015-05-25 ENCOUNTER — Encounter (HOSPITAL_COMMUNITY): Admission: RE | Disposition: A | Discharge status: Home / Self Care | Payer: Self-pay | Source: Ambulatory Visit

## 2015-05-25 ENCOUNTER — Ambulatory Visit (HOSPITAL_COMMUNITY): Payer: Medicare Other | Admitting: Anesthesiology

## 2015-05-25 ENCOUNTER — Observation Stay (HOSPITAL_COMMUNITY)
Admission: RE | Admit: 2015-05-25 | Discharge: 2015-05-26 | Disposition: A | Discharge status: Home / Self Care | Payer: Medicare Other | Source: Ambulatory Visit

## 2015-05-25 ENCOUNTER — Ambulatory Visit (HOSPITAL_COMMUNITY): Payer: Medicare Other | Admitting: Emergency Medicine

## 2015-05-25 DIAGNOSIS — J440 Chronic obstructive pulmonary disease with acute lower respiratory infection: Secondary | ICD-10-CM | POA: Diagnosis not present

## 2015-05-25 DIAGNOSIS — E875 Hyperkalemia: Secondary | ICD-10-CM | POA: Diagnosis not present

## 2015-05-25 DIAGNOSIS — E1140 Type 2 diabetes mellitus with diabetic neuropathy, unspecified: Secondary | ICD-10-CM | POA: Insufficient documentation

## 2015-05-25 DIAGNOSIS — E1165 Type 2 diabetes mellitus with hyperglycemia: Secondary | ICD-10-CM | POA: Diagnosis not present

## 2015-05-25 DIAGNOSIS — E669 Obesity, unspecified: Secondary | ICD-10-CM | POA: Insufficient documentation

## 2015-05-25 DIAGNOSIS — N184 Chronic kidney disease, stage 4 (severe): Secondary | ICD-10-CM | POA: Insufficient documentation

## 2015-05-25 DIAGNOSIS — D509 Iron deficiency anemia, unspecified: Secondary | ICD-10-CM | POA: Insufficient documentation

## 2015-05-25 DIAGNOSIS — I130 Hypertensive heart and chronic kidney disease with heart failure and stage 1 through stage 4 chronic kidney disease, or unspecified chronic kidney disease: Secondary | ICD-10-CM | POA: Insufficient documentation

## 2015-05-25 DIAGNOSIS — E7800 Pure hypercholesterolemia, unspecified: Secondary | ICD-10-CM

## 2015-05-25 DIAGNOSIS — Z6838 Body mass index (BMI) 38.0-38.9, adult: Secondary | ICD-10-CM

## 2015-05-25 DIAGNOSIS — F329 Major depressive disorder, single episode, unspecified: Secondary | ICD-10-CM | POA: Diagnosis not present

## 2015-05-25 DIAGNOSIS — K851 Biliary acute pancreatitis without necrosis or infection: Secondary | ICD-10-CM | POA: Diagnosis present

## 2015-05-25 DIAGNOSIS — E785 Hyperlipidemia, unspecified: Secondary | ICD-10-CM | POA: Diagnosis present

## 2015-05-25 DIAGNOSIS — M351 Other overlap syndromes: Secondary | ICD-10-CM | POA: Diagnosis not present

## 2015-05-25 DIAGNOSIS — A419 Sepsis, unspecified organism: Secondary | ICD-10-CM | POA: Diagnosis not present

## 2015-05-25 DIAGNOSIS — D62 Acute posthemorrhagic anemia: Secondary | ICD-10-CM | POA: Diagnosis not present

## 2015-05-25 DIAGNOSIS — J438 Other emphysema: Secondary | ICD-10-CM | POA: Diagnosis not present

## 2015-05-25 DIAGNOSIS — K8010 Calculus of gallbladder with chronic cholecystitis without obstruction: Secondary | ICD-10-CM | POA: Diagnosis not present

## 2015-05-25 DIAGNOSIS — I509 Heart failure, unspecified: Secondary | ICD-10-CM | POA: Insufficient documentation

## 2015-05-25 DIAGNOSIS — R0902 Hypoxemia: Secondary | ICD-10-CM | POA: Diagnosis not present

## 2015-05-25 DIAGNOSIS — G2581 Restless legs syndrome: Secondary | ICD-10-CM | POA: Insufficient documentation

## 2015-05-25 DIAGNOSIS — Z7982 Long term (current) use of aspirin: Secondary | ICD-10-CM

## 2015-05-25 DIAGNOSIS — N179 Acute kidney failure, unspecified: Secondary | ICD-10-CM | POA: Diagnosis not present

## 2015-05-25 DIAGNOSIS — Z79899 Other long term (current) drug therapy: Secondary | ICD-10-CM

## 2015-05-25 DIAGNOSIS — Z794 Long term (current) use of insulin: Secondary | ICD-10-CM | POA: Insufficient documentation

## 2015-05-25 DIAGNOSIS — Z87891 Personal history of nicotine dependence: Secondary | ICD-10-CM

## 2015-05-25 DIAGNOSIS — M5104 Intervertebral disc disorders with myelopathy, thoracic region: Secondary | ICD-10-CM | POA: Diagnosis not present

## 2015-05-25 DIAGNOSIS — IMO0001 Reserved for inherently not codable concepts without codable children: Secondary | ICD-10-CM | POA: Diagnosis present

## 2015-05-25 DIAGNOSIS — I10 Essential (primary) hypertension: Secondary | ICD-10-CM | POA: Diagnosis present

## 2015-05-25 DIAGNOSIS — E119 Type 2 diabetes mellitus without complications: Secondary | ICD-10-CM | POA: Diagnosis not present

## 2015-05-25 DIAGNOSIS — J449 Chronic obstructive pulmonary disease, unspecified: Secondary | ICD-10-CM | POA: Diagnosis not present

## 2015-05-25 DIAGNOSIS — J189 Pneumonia, unspecified organism: Secondary | ICD-10-CM | POA: Diagnosis not present

## 2015-05-25 DIAGNOSIS — G894 Chronic pain syndrome: Secondary | ICD-10-CM | POA: Insufficient documentation

## 2015-05-25 DIAGNOSIS — K219 Gastro-esophageal reflux disease without esophagitis: Secondary | ICD-10-CM | POA: Insufficient documentation

## 2015-05-25 DIAGNOSIS — E538 Deficiency of other specified B group vitamins: Secondary | ICD-10-CM | POA: Diagnosis not present

## 2015-05-25 DIAGNOSIS — E872 Acidosis: Secondary | ICD-10-CM | POA: Diagnosis not present

## 2015-05-25 DIAGNOSIS — F411 Generalized anxiety disorder: Secondary | ICD-10-CM | POA: Diagnosis not present

## 2015-05-25 DIAGNOSIS — Z7951 Long term (current) use of inhaled steroids: Secondary | ICD-10-CM | POA: Insufficient documentation

## 2015-05-25 DIAGNOSIS — L959 Vasculitis limited to the skin, unspecified: Secondary | ICD-10-CM | POA: Diagnosis not present

## 2015-05-25 DIAGNOSIS — M1990 Unspecified osteoarthritis, unspecified site: Secondary | ICD-10-CM | POA: Insufficient documentation

## 2015-05-25 DIAGNOSIS — G4733 Obstructive sleep apnea (adult) (pediatric): Secondary | ICD-10-CM | POA: Insufficient documentation

## 2015-05-25 DIAGNOSIS — J439 Emphysema, unspecified: Secondary | ICD-10-CM | POA: Diagnosis not present

## 2015-05-25 DIAGNOSIS — R509 Fever, unspecified: Secondary | ICD-10-CM | POA: Diagnosis not present

## 2015-05-25 DIAGNOSIS — R21 Rash and other nonspecific skin eruption: Secondary | ICD-10-CM | POA: Diagnosis not present

## 2015-05-25 DIAGNOSIS — R0602 Shortness of breath: Secondary | ICD-10-CM | POA: Diagnosis not present

## 2015-05-25 DIAGNOSIS — E1122 Type 2 diabetes mellitus with diabetic chronic kidney disease: Secondary | ICD-10-CM

## 2015-05-25 DIAGNOSIS — R0600 Dyspnea, unspecified: Secondary | ICD-10-CM | POA: Diagnosis not present

## 2015-05-25 DIAGNOSIS — K8051 Calculus of bile duct without cholangitis or cholecystitis with obstruction: Secondary | ICD-10-CM | POA: Diagnosis present

## 2015-05-25 DIAGNOSIS — K8065 Calculus of gallbladder and bile duct with chronic cholecystitis with obstruction: Secondary | ICD-10-CM

## 2015-05-25 DIAGNOSIS — I5032 Chronic diastolic (congestive) heart failure: Secondary | ICD-10-CM | POA: Diagnosis not present

## 2015-05-25 DIAGNOSIS — K8020 Calculus of gallbladder without cholecystitis without obstruction: Secondary | ICD-10-CM | POA: Diagnosis present

## 2015-05-25 HISTORY — PX: CHOLECYSTECTOMY: SHX55

## 2015-05-25 HISTORY — PX: LAPAROSCOPIC CHOLECYSTECTOMY: SUR755

## 2015-05-25 LAB — GLUCOSE, CAPILLARY
GLUCOSE-CAPILLARY: 279 mg/dL — AB (ref 65–99)
Glucose-Capillary: 169 mg/dL — ABNORMAL HIGH (ref 65–99)
Glucose-Capillary: 194 mg/dL — ABNORMAL HIGH (ref 65–99)
Glucose-Capillary: 218 mg/dL — ABNORMAL HIGH (ref 65–99)

## 2015-05-25 SURGERY — LAPAROSCOPIC CHOLECYSTECTOMY
Anesthesia: General | Site: Abdomen

## 2015-05-25 MED ORDER — 0.9 % SODIUM CHLORIDE (POUR BTL) OPTIME
TOPICAL | Status: DC | PRN
  Administered 2015-05-25: 1000 mL

## 2015-05-25 MED ORDER — SUCCINYLCHOLINE CHLORIDE 20 MG/ML IJ SOLN
INTRAMUSCULAR | Status: AC
  Filled 2015-05-25: qty 1

## 2015-05-25 MED ORDER — EPHEDRINE SULFATE 50 MG/ML IJ SOLN
INTRAMUSCULAR | Status: AC
  Filled 2015-05-25: qty 1

## 2015-05-25 MED ORDER — ALBUTEROL SULFATE HFA 108 (90 BASE) MCG/ACT IN AERS
INHALATION_SPRAY | RESPIRATORY_TRACT | Status: AC
  Filled 2015-05-25: qty 6.7

## 2015-05-25 MED ORDER — BUDESONIDE-FORMOTEROL FUMARATE 80-4.5 MCG/ACT IN AERO
1.0000 | INHALATION_SPRAY | Freq: Two times a day (BID) | RESPIRATORY_TRACT | Status: DC
  Filled 2015-05-25 (×2): qty 6.9

## 2015-05-25 MED ORDER — ENSURE ENLIVE PO LIQD: 237.0000 mL | Freq: Two times a day (BID) | ORAL | Status: DC

## 2015-05-25 MED ORDER — INSULIN ASPART 100 UNIT/ML ~~LOC~~ SOLN
6.0000 [IU] | Freq: Three times a day (TID) | SUBCUTANEOUS | Status: DC
  Administered 2015-05-25: 6 [IU] via SUBCUTANEOUS

## 2015-05-25 MED ORDER — ONDANSETRON HCL 4 MG/2ML IJ SOLN
INTRAMUSCULAR | Status: DC | PRN
Start: 2015-05-25 — End: 2015-05-25
  Administered 2015-05-25: 4 mg via INTRAVENOUS

## 2015-05-25 MED ORDER — FENTANYL CITRATE (PF) 100 MCG/2ML IJ SOLN
INTRAMUSCULAR | Status: DC | PRN
  Administered 2015-05-25: 25 ug via INTRAVENOUS
  Administered 2015-05-25: 75 ug via INTRAVENOUS

## 2015-05-25 MED ORDER — OXYCODONE HCL 5 MG PO TABS
ORAL_TABLET | ORAL | Status: AC
  Filled 2015-05-25: qty 2

## 2015-05-25 MED ORDER — FEBUXOSTAT 40 MG PO TABS: 40.0000 mg | ORAL_TABLET | Freq: Every day | ORAL | Status: DC

## 2015-05-25 MED ORDER — LABETALOL HCL 5 MG/ML IV SOLN
5.0000 mg | INTRAVENOUS | Status: DC | PRN
  Administered 2015-05-25 (×2): 5 mg via INTRAVENOUS

## 2015-05-25 MED ORDER — FENTANYL CITRATE (PF) 100 MCG/2ML IJ SOLN
25.0000 ug | INTRAMUSCULAR | Status: DC | PRN
  Administered 2015-05-25: 25 ug via INTRAVENOUS
  Administered 2015-05-25: 50 ug via INTRAVENOUS
  Administered 2015-05-25 (×3): 25 ug via INTRAVENOUS

## 2015-05-25 MED ORDER — AMLODIPINE BESYLATE 10 MG PO TABS: 10.0000 mg | ORAL_TABLET | Freq: Every day | ORAL | Status: DC

## 2015-05-25 MED ORDER — LIDOCAINE HCL (CARDIAC) 20 MG/ML IV SOLN
INTRAVENOUS | Status: AC
  Filled 2015-05-25: qty 5

## 2015-05-25 MED ORDER — LIDOCAINE HCL (CARDIAC) 20 MG/ML IV SOLN
INTRAVENOUS | Status: DC | PRN
  Administered 2015-05-25: 80 mg via INTRAVENOUS

## 2015-05-25 MED ORDER — INSULIN ASPART 100 UNIT/ML ~~LOC~~ SOLN
0.0000 [IU] | Freq: Every day | SUBCUTANEOUS | Status: DC
  Administered 2015-05-25: 2 [IU] via SUBCUTANEOUS

## 2015-05-25 MED ORDER — DIPHENHYDRAMINE HCL 50 MG/ML IJ SOLN: 12.5000 mg | Freq: Four times a day (QID) | INTRAMUSCULAR | Status: DC | PRN

## 2015-05-25 MED ORDER — ACETAMINOPHEN 650 MG RE SUPP: 650.0000 mg | Freq: Four times a day (QID) | RECTAL | Status: DC | PRN

## 2015-05-25 MED ORDER — LACTATED RINGERS IV SOLN: INTRAVENOUS | Status: DC

## 2015-05-25 MED ORDER — ROCURONIUM BROMIDE 50 MG/5ML IV SOLN
INTRAVENOUS | Status: AC
  Filled 2015-05-25: qty 1

## 2015-05-25 MED ORDER — MEPERIDINE HCL 25 MG/ML IJ SOLN: 6.2500 mg | INTRAMUSCULAR | Status: DC | PRN

## 2015-05-25 MED ORDER — SODIUM CHLORIDE 0.9 % IV SOLN
INTRAVENOUS | Status: DC
  Administered 2015-05-26: 04:00:00 via INTRAVENOUS

## 2015-05-25 MED ORDER — CITALOPRAM HYDROBROMIDE 20 MG PO TABS: 20.0000 mg | ORAL_TABLET | Freq: Every day | ORAL | Status: DC

## 2015-05-25 MED ORDER — ONDANSETRON HCL 4 MG/2ML IJ SOLN: 4.0000 mg | Freq: Four times a day (QID) | INTRAMUSCULAR | Status: DC | PRN

## 2015-05-25 MED ORDER — FENTANYL CITRATE (PF) 250 MCG/5ML IJ SOLN
INTRAMUSCULAR | Status: AC
  Filled 2015-05-25: qty 5

## 2015-05-25 MED ORDER — FEBUXOSTAT 40 MG PO TABS
40.0000 mg | ORAL_TABLET | Freq: Every day | ORAL | Status: DC
  Filled 2015-05-25: qty 1

## 2015-05-25 MED ORDER — CLONIDINE HCL 0.2 MG PO TABS
0.2000 mg | ORAL_TABLET | Freq: Three times a day (TID) | ORAL | Status: DC
  Administered 2015-05-25 (×2): 0.2 mg via ORAL
  Filled 2015-05-25 (×2): qty 1

## 2015-05-25 MED ORDER — BUPIVACAINE-EPINEPHRINE (PF) 0.25% -1:200000 IJ SOLN
INTRAMUSCULAR | Status: AC
  Filled 2015-05-25: qty 30

## 2015-05-25 MED ORDER — ONDANSETRON HCL 4 MG/2ML IJ SOLN
INTRAMUSCULAR | Status: AC
  Filled 2015-05-25: qty 2

## 2015-05-25 MED ORDER — DEXAMETHASONE SODIUM PHOSPHATE 4 MG/ML IJ SOLN
INTRAMUSCULAR | Status: AC
  Filled 2015-05-25: qty 1

## 2015-05-25 MED ORDER — GLYCOPYRROLATE 0.2 MG/ML IJ SOLN
INTRAMUSCULAR | Status: AC
  Filled 2015-05-25: qty 1

## 2015-05-25 MED ORDER — SODIUM CHLORIDE 0.9 % IV SOLN
INTRAVENOUS | Status: DC | PRN
  Administered 2015-05-25: 07:00:00 via INTRAVENOUS

## 2015-05-25 MED ORDER — OXYCODONE HCL 5 MG PO TABS
5.0000 mg | ORAL_TABLET | ORAL | Status: DC | PRN
  Administered 2015-05-25 – 2015-05-26 (×3): 10 mg via ORAL
  Filled 2015-05-25 (×2): qty 2

## 2015-05-25 MED ORDER — PROPOFOL 10 MG/ML IV BOLUS
INTRAVENOUS | Status: DC | PRN
  Administered 2015-05-25: 150 mg via INTRAVENOUS
  Administered 2015-05-25: 20 mg via INTRAVENOUS

## 2015-05-25 MED ORDER — FUROSEMIDE 40 MG PO TABS
40.0000 mg | ORAL_TABLET | Freq: Every day | ORAL | Status: DC
  Administered 2015-05-25: 40 mg via ORAL
  Filled 2015-05-25: qty 1

## 2015-05-25 MED ORDER — SUCCINYLCHOLINE CHLORIDE 20 MG/ML IJ SOLN
INTRAMUSCULAR | Status: DC | PRN
  Administered 2015-05-25: 120 mg via INTRAVENOUS

## 2015-05-25 MED ORDER — PROMETHAZINE HCL 25 MG/ML IJ SOLN: 6.2500 mg | INTRAMUSCULAR | Status: DC | PRN

## 2015-05-25 MED ORDER — ISOSORBIDE MONONITRATE ER 30 MG PO TB24: 30.0000 mg | ORAL_TABLET | Freq: Every day | ORAL | Status: DC

## 2015-05-25 MED ORDER — FENTANYL CITRATE (PF) 100 MCG/2ML IJ SOLN
INTRAMUSCULAR | Status: AC
  Administered 2015-05-25: 25 ug via INTRAVENOUS
  Filled 2015-05-25: qty 2

## 2015-05-25 MED ORDER — SODIUM CHLORIDE 0.9 % IJ SOLN
INTRAMUSCULAR | Status: AC
  Filled 2015-05-25: qty 10

## 2015-05-25 MED ORDER — MORPHINE SULFATE (PF) 2 MG/ML IV SOLN: 1.0000 mg | INTRAVENOUS | Status: DC | PRN

## 2015-05-25 MED ORDER — SODIUM CHLORIDE 0.9 % IR SOLN
Status: DC | PRN
  Administered 2015-05-25: 1

## 2015-05-25 MED ORDER — ONDANSETRON 4 MG PO TBDP: 4.0000 mg | ORAL_TABLET | Freq: Four times a day (QID) | ORAL | Status: DC | PRN

## 2015-05-25 MED ORDER — GABAPENTIN 300 MG PO CAPS
300.0000 mg | ORAL_CAPSULE | Freq: Three times a day (TID) | ORAL | Status: DC
  Administered 2015-05-25 (×2): 300 mg via ORAL
  Filled 2015-05-25 (×2): qty 1

## 2015-05-25 MED ORDER — HYDRALAZINE HCL 20 MG/ML IJ SOLN
10.0000 mg | INTRAMUSCULAR | Status: DC | PRN
  Administered 2015-05-26: 10 mg via INTRAVENOUS
  Filled 2015-05-25: qty 1

## 2015-05-25 MED ORDER — LABETALOL HCL 5 MG/ML IV SOLN
INTRAVENOUS | Status: AC
  Administered 2015-05-25: 5 mg via INTRAVENOUS
  Filled 2015-05-25: qty 4

## 2015-05-25 MED ORDER — ALBUTEROL SULFATE HFA 108 (90 BASE) MCG/ACT IN AERS
INHALATION_SPRAY | RESPIRATORY_TRACT | Status: DC | PRN
  Administered 2015-05-25 (×2): 2 via RESPIRATORY_TRACT

## 2015-05-25 MED ORDER — DIPHENHYDRAMINE HCL 12.5 MG/5ML PO ELIX: 12.5000 mg | ORAL_SOLUTION | Freq: Four times a day (QID) | ORAL | Status: DC | PRN

## 2015-05-25 MED ORDER — ACETAMINOPHEN 325 MG PO TABS: 650.0000 mg | ORAL_TABLET | Freq: Four times a day (QID) | ORAL | Status: DC | PRN

## 2015-05-25 MED ORDER — INSULIN ASPART 100 UNIT/ML ~~LOC~~ SOLN
0.0000 [IU] | Freq: Three times a day (TID) | SUBCUTANEOUS | Status: DC
  Administered 2015-05-25: 11 [IU] via SUBCUTANEOUS
  Administered 2015-05-26: 7 [IU] via SUBCUTANEOUS

## 2015-05-25 MED ORDER — BUPIVACAINE-EPINEPHRINE 0.25% -1:200000 IJ SOLN
INTRAMUSCULAR | Status: DC | PRN
  Administered 2015-05-25: 20 mL

## 2015-05-25 MED ORDER — PROPOFOL 10 MG/ML IV BOLUS
INTRAVENOUS | Status: AC
  Filled 2015-05-25: qty 20

## 2015-05-25 MED ORDER — FENTANYL CITRATE (PF) 100 MCG/2ML IJ SOLN
INTRAMUSCULAR | Status: AC
  Filled 2015-05-25: qty 2

## 2015-05-25 MED ORDER — PHENYLEPHRINE 40 MCG/ML (10ML) SYRINGE FOR IV PUSH (FOR BLOOD PRESSURE SUPPORT)
PREFILLED_SYRINGE | INTRAVENOUS | Status: AC
  Filled 2015-05-25: qty 10

## 2015-05-25 MED ORDER — ENOXAPARIN SODIUM 30 MG/0.3ML ~~LOC~~ SOLN
30.0000 mg | SUBCUTANEOUS | Status: DC
Start: 2015-05-26 — End: 2015-05-26
  Administered 2015-05-26: 30 mg via SUBCUTANEOUS
  Filled 2015-05-25: qty 0.3

## 2015-05-25 SURGICAL SUPPLY — 36 items
APPLIER CLIP 5 13 M/L LIGAMAX5 (MISCELLANEOUS) ×3
APR CLP MED LRG 5 ANG JAW (MISCELLANEOUS) ×1
BAG SPEC RTRVL LRG 6X4 10 (ENDOMECHANICALS) ×1
CANISTER SUCTION 2500CC (MISCELLANEOUS) ×3 IMPLANT
CHLORAPREP W/TINT 26ML (MISCELLANEOUS) ×3 IMPLANT
CLIP APPLIE 5 13 M/L LIGAMAX5 (MISCELLANEOUS) ×1 IMPLANT
COVER SURGICAL LIGHT HANDLE (MISCELLANEOUS) ×3 IMPLANT
ELECT REM PT RETURN 9FT ADLT (ELECTROSURGICAL) ×3
ELECTRODE REM PT RTRN 9FT ADLT (ELECTROSURGICAL) ×1 IMPLANT
GLOVE BIOGEL PI IND STRL 8 (GLOVE) IMPLANT
GLOVE BIOGEL PI IND STRL 8.5 (GLOVE) IMPLANT
GLOVE BIOGEL PI INDICATOR 8 (GLOVE) ×2
GLOVE BIOGEL PI INDICATOR 8.5 (GLOVE) ×2
GLOVE SURG SIGNA 7.5 PF LTX (GLOVE) ×3 IMPLANT
GLOVE SURG SS PI 8.0 STRL IVOR (GLOVE) ×4 IMPLANT
GOWN STRL REUS W/ TWL LRG LVL3 (GOWN DISPOSABLE) ×2 IMPLANT
GOWN STRL REUS W/ TWL XL LVL3 (GOWN DISPOSABLE) ×1 IMPLANT
GOWN STRL REUS W/TWL LRG LVL3 (GOWN DISPOSABLE) ×6
GOWN STRL REUS W/TWL XL LVL3 (GOWN DISPOSABLE) ×3
KIT BASIN OR (CUSTOM PROCEDURE TRAY) ×3 IMPLANT
KIT ROOM TURNOVER OR (KITS) ×3 IMPLANT
LIQUID BAND (GAUZE/BANDAGES/DRESSINGS) ×3 IMPLANT
NS IRRIG 1000ML POUR BTL (IV SOLUTION) ×3 IMPLANT
PAD ARMBOARD 7.5X6 YLW CONV (MISCELLANEOUS) ×3 IMPLANT
POUCH SPECIMEN RETRIEVAL 10MM (ENDOMECHANICALS) ×3 IMPLANT
SCISSORS LAP 5X35 DISP (ENDOMECHANICALS) ×3 IMPLANT
SET IRRIG TUBING LAPAROSCOPIC (IRRIGATION / IRRIGATOR) ×3 IMPLANT
SLEEVE ENDOPATH XCEL 5M (ENDOMECHANICALS) ×6 IMPLANT
SPECIMEN JAR SMALL (MISCELLANEOUS) ×3 IMPLANT
SUT MON AB 4-0 PC3 18 (SUTURE) ×3 IMPLANT
TOWEL OR 17X24 6PK STRL BLUE (TOWEL DISPOSABLE) ×3 IMPLANT
TOWEL OR 17X26 10 PK STRL BLUE (TOWEL DISPOSABLE) ×3 IMPLANT
TRAY LAPAROSCOPIC MC (CUSTOM PROCEDURE TRAY) ×3 IMPLANT
TROCAR XCEL BLUNT TIP 100MML (ENDOMECHANICALS) ×3 IMPLANT
TROCAR XCEL NON-BLD 5MMX100MML (ENDOMECHANICALS) ×3 IMPLANT
TUBING INSUFFLATION (TUBING) ×3 IMPLANT

## 2015-05-25 NOTE — Discharge Summary (Addendum)
Physician Discharge Summary  Patient ID: Anna [name] MRN: [id number] DOB/AGE: 02-Mar-1935 81 y.o.  Admit date: 05/25/2015 Discharge date: 05/25/2015  PCP:  Chevis Pretty, FNP   Admission Diagnoses:  Hx of gallstone pancreatitis and choledocholithiasis  AODM ID Diabetic neuropathy Hypertension COPD OSA Chronic kidney disease, (StageIV) Chronic pain Anemia Cardiology evaluation 04/24/15 (No ischemia EF 71%)   Discharge Diagnoses:  Principal Problem:   Calculus of bile duct with obstruction and without cholangitis or cholecystitis Active Problems:   Acute biliary pancreatitis   Diabetes mellitus, insulin dependent (IDDM), uncontrolled (HCC)   Hypertension   Hyperlipidemia   COPD (chronic obstructive pulmonary disease) (HCC)   Osteoarthritis   Diabetic neuropathy (HCC)   Obesity   OSA (obstructive sleep apnea)   CKD (chronic kidney disease), stage IV (HCC)   RLS (restless legs syndrome)   Chronic pain syndrome   Anemia, iron deficiency   Symptomatic cholelithiasis   PROCEDURES: laparoscopic cholecystectomy, 05/25/15, Dr. Bill Salinas Course:  The patient is a 81 year old female who presents with pancreatitis and symptomatic choledocholithiasis.Hospitalized from 04/20/15-04/25/15.  During that hospitalization, she had an ERCP with stone removal. Because of her chronic comorbidities, surgery was deferred. During her admission, cardiology saw her and her last ejection fraction was 71%. Since going home, she has felt well. She has had minimal shortness of breath and no abdominal pain. She is eating well.  She is here for a hospital follow-up, and seen by Dr. Ninfa Linden.  She was admitted and underwent laparoscopic cholecystectomy on 05/23/15.  Did well post op.  Discharged POD#1         Disposition: 06-Home-Health Care Svc     Medication List    ASK your doctor about these medications        acetaminophen 650 MG CR tablet  Commonly known  as:  TYLENOL  Take 1,300 mg by mouth at bedtime.     amLODipine 10 MG tablet  Commonly known as:  NORVASC  Take 1 tablet (10 mg total) by mouth daily.     aspirin 81 MG tablet  Take 81 mg by mouth daily.     B-D UF III MINI PEN NEEDLES 31G X 5 MM Misc  Generic drug:  Insulin Pen Needle  USE TWICE A DAY TO INJECT NOVOLOG MIX 70/30 INSULIN     EASY TOUCH PEN NEEDLES 31G X 8 MM Misc  Generic drug:  Insulin Pen Needle  USE TWICE DAILY     budesonide-formoterol 80-4.5 MCG/ACT inhaler  Commonly known as:  SYMBICORT  Inhale 1 puff into the lungs 2 (two) times daily.     carvedilol 25 MG tablet  Commonly known as:  COREG  TAKE 1 TABLET BY MOUTH TWICE DAILY WITH A MEAL     citalopram 20 MG tablet  Commonly known as:  CELEXA  TAKE 1 TABLET BY MOUTH EVERY DAY     cloNIDine 0.2 MG tablet  Commonly known as:  CATAPRES  Take 1 tablet (0.2 mg total) by mouth 3 (three) times daily.     EASY TOUCH INSULIN SYRINGE 31G X 5/16" 1 ML Misc  Generic drug:  Insulin Syringe-Needle U-100  USE TO INJECT INSULIN TWICE DAILY     febuxostat 40 MG tablet  Commonly known as:  ULORIC  Take 1 tablet (40 mg total) by mouth daily.     ULORIC 40 MG tablet  Generic drug:  febuxostat  TAKE 1 TABLET BY MOUTH ONCE DAILY     fenofibrate 145  MG tablet  Commonly known as:  TRICOR  Take 1 tablet (145 mg total) by mouth daily.     Fish Oil 1000 MG Caps  Take 1 capsule by mouth daily.     furosemide 40 MG tablet  Commonly known as:  LASIX  Take 1 tablet (40 mg total) by mouth daily.     gabapentin 300 MG capsule  Commonly known as:  NEURONTIN  TAKE 1 CAPSULE BY MOUTH FOUR TIMES A DAY     glimepiride 4 MG tablet  Commonly known as:  AMARYL  TAKE 1/2 TABLET BY MOUTH EVERY DAY BEFORE BREAKFAST     glucose blood test strip  Test 4x a day and prn  Dx E11.9     glucose blood test strip  Commonly known as:  ACCU-CHEK AVIVA PLUS  Test 4-5 x a day and prn dx E11.9     glucose blood test strip   Commonly known as:  ONETOUCH VERIO  Test 4x a day and prn  E11.9     insulin aspart protamine- aspart (70-30) 100 UNIT/ML injection  Commonly known as:  NOVOLOG MIX 70/30  Inject 55-65 Units into the skin 2 (two) times daily with a meal. 55 units in the morning and 65 units in the evening     Insulin Lispro Prot & Lispro (75-25) 100 UNIT/ML Kwikpen  Commonly known as:  HUMALOG MIX 75/25 KWIKPEN  55 u in am and 65u in pm     Iron 325 (65 Fe) MG Tabs  Take 1 tablet by mouth daily.     isosorbide mononitrate 30 MG 24 hr tablet  Commonly known as:  IMDUR  Take 1 tablet (30 mg total) by mouth daily.     nystatin cream  Commonly known as:  MYCOSTATIN  APPLY TOPICALLY TWO TIMES DAILY.     omeprazole 40 MG capsule  Commonly known as:  PRILOSEC  TAKE 1 CAPSULE BY MOUTH EVERY DAY     omeprazole 40 MG capsule  Commonly known as:  PRILOSEC  TAKE 1 CAPSULE BY MOUTH EVERY DAY     oxyCODONE-acetaminophen 10-325 MG tablet  Commonly known as:  PERCOCET  Take 1 tablet by mouth 2 (two) times daily.     PHARMACIST CHOICE ALCOHOL Pads  USE 2 TIMES DAILY FOR DIABETIC TESTING     PHARMACIST CHOICE LANCETS Misc  USE 2 TIMES DAILY FOR DIABETIC TESTING     polyethylene glycol packet  Commonly known as:  MIRALAX / GLYCOLAX  Take 17 g by mouth daily. And an additional packet if needed     rosuvastatin 20 MG tablet  Commonly known as:  CRESTOR  TAKE 1 TABLET BY MOUTH EVERY DAY         Signed: Coralie Keens, MD

## 2015-05-25 NOTE — Care Management Obs Status (Addendum)
[facility] NOTIFICATION   Patient Details  Name: GREYSEN GUERINO MRN: [id number] Date of Birth: May 23, 1934   Medicare Observation Status Notification Given:  Yes    Guido Sander, RN 05/25/2015, 8:08 PM

## 2015-05-25 NOTE — Progress Notes (Signed)
Reported pt's BP of 213/70 to Dr. Smith Robert.  They will treat in the OR.

## 2015-05-25 NOTE — Discharge Instructions (Signed)
[facility] ______[facility] °LAPAROSCOPIC SURGERY: POST OP INSTRUCTIONS °Always review your discharge instruction sheet given to you by the facility where your surgery was performed. °IF YOU HAVE DISABILITY OR FAMILY LEAVE FORMS, YOU MUST BRING THEM TO THE OFFICE FOR PROCESSING.   °DO NOT GIVE THEM TO YOUR DOCTOR. ° °1. A prescription for pain medication may be given to you upon discharge.  Take your pain medication as prescribed, if needed.  If narcotic pain medicine is not needed, then you may take acetaminophen (Tylenol) or ibuprofen (Advil) as needed. °2. Take your usually prescribed medications unless otherwise directed. °3. If you need a refill on your pain medication, please contact your pharmacy.  They will contact our office to request authorization. Prescriptions will not be filled after 5pm or on week-ends. °4. You should follow a light diet the first few days after arrival home, such as soup and crackers, etc.  Be sure to include lots of fluids daily. °5. Most patients will experience some swelling and bruising in the area of the incisions.  Ice packs will help.  Swelling and bruising can take several days to resolve.  °6. It is common to experience some constipation if taking pain medication after surgery.  Increasing fluid intake and taking a stool softener (such as Colace) will usually help or prevent this problem from occurring.  A mild laxative (Milk of Magnesia or Miralax) should be taken according to package instructions if there are no bowel movements after 48 hours. °7. Unless discharge instructions indicate otherwise, you may remove your bandages 24-48 hours after surgery, and you may shower at that time.  You may have steri-strips (small skin tapes) in place directly over the incision.  These strips should be left on the skin for 7-10 days.  If your surgeon used skin glue on the incision, you may shower in 24 hours.  The glue will flake off over the next 2-3 weeks.  Any sutures or  staples will be removed at the office during your follow-up visit. °8. ACTIVITIES:  You may resume regular (light) daily activities beginning the next day--such as daily self-care, walking, climbing stairs--gradually increasing activities as tolerated.  You may have sexual intercourse when it is comfortable.  Refrain from any heavy lifting or straining until approved by your doctor. °a. You may drive when you are no longer taking prescription pain medication, you can comfortably wear a seatbelt, and you can safely maneuver your car and apply brakes. °b. RETURN TO WORK:  __________________________________________________________ °9. You should see your doctor in the office for a follow-up appointment approximately 2-3 weeks after your surgery.  Make sure that you call for this appointment within a day or two after you arrive home to insure a convenient appointment time. °10. OTHER INSTRUCTIONS: __________________________________________________________________________________________________________________________ __________________________________________________________________________________________________________________________ °WHEN TO CALL YOUR DOCTOR: °1. Fever over 101.0 °2. Inability to urinate °3. Continued bleeding from incision. °4. Increased pain, redness, or drainage from the incision. °5. Increasing abdominal pain ° °The clinic staff is available to answer your questions during regular business hours.  Please don’t hesitate to call and ask to speak to one of the nurses for clinical concerns.  If you have a medical emergency, go to the nearest emergency room or call 911.  A surgeon from [facility] is always on call at the hospital. °[address], [address], [place], [place]  [address] ? [address] [address], [place],    [address] °[phone] ? [phone] ? FAX [phone] °Web site:   [email or web address]  Laparoscopic Cholecystectomy, Care After Refer to this  sheet in the next few weeks. These instructions provide you with information about caring for yourself after your procedure. Your health care provider may also give you more specific instructions. Your treatment has been planned according to current medical practices, but problems sometimes occur. Call your health care provider if you have any problems or questions after your procedure. WHAT TO EXPECT AFTER THE PROCEDURE After your procedure, it is common to have:  Pain at your incision sites. You will be given pain medicines to control your pain.  Mild nausea or vomiting. This should improve after the first 24 hours.  Bloating and possible shoulder pain from the gas that was used during the procedure. This will improve after the first 24 hours. HOME CARE INSTRUCTIONS Incision Care  Follow instructions from your health care provider about how to take care of your incisions. Make sure you:  Wash your hands with soap and water before you change your bandage (dressing). If soap and water are not available, use hand sanitizer.  Change your dressing as told by your health care provider.  Leave stitches (sutures), skin glue, or adhesive strips in place. These skin closures may need to be in place for 2 weeks or longer. If adhesive strip edges start to loosen and curl up, you may trim the loose edges. Do not remove adhesive strips completely unless your health care provider tells you to do that.  Do not take baths, swim, or use a hot tub until your health care provider approves. Ask your health care provider if you can take showers. You may only be allowed to take sponge baths for bathing. General Instructions  Take over-the-counter and prescription medicines only as told by your health care provider.  Do not drive or operate heavy machinery while taking prescription pain medicine.  Return to your normal diet as told by your health care provider.  Do not lift anything that is heavier than 10 lb  (4.5 kg).  Do not play contact sports for one week or until your health care provider approves. SEEK MEDICAL CARE IF:   You have redness, swelling, or pain at the site of your incision.  You have fluid, blood, or pus coming from your incision.  You notice a bad smell coming from your incision area.  Your surgical incisions break open.  You have a fever. SEEK IMMEDIATE MEDICAL CARE IF:  You develop a rash.  You have difficulty breathing.  You have chest pain.  You have increasing pain in your shoulders (shoulder strap areas).  You faint or have dizzy episodes while you are standing.  You have severe pain in your abdomen.  You have nausea or vomiting that lasts for more than one day.   This information is not intended to replace advice given to you by your health care provider. Make sure you discuss any questions you have with your health care provider.   Document Released: 04/17/2005 Document Revised: 01/06/2015 Document Reviewed: 11/27/2012 Elsevier Interactive Patient Education [employer].

## 2015-05-25 NOTE — Progress Notes (Signed)
Received from PACU at this time, oriented to room and surroundings, large family at bedside, denies nausea/pain at this time.

## 2015-05-25 NOTE — Anesthesia Procedure Notes (Signed)
Procedure Name: Intubation Date/Time: 05/25/2015 7:37 AM Performed by: Jenne Campus Pre-anesthesia Checklist: Patient identified, Emergency Drugs available, Suction available, Patient being monitored and Timeout performed Patient Re-evaluated:Patient Re-evaluated prior to inductionOxygen Delivery Method: Circle system utilized Preoxygenation: Pre-oxygenation with 100% oxygen Intubation Type: IV induction Ventilation: Mask ventilation without difficulty Laryngoscope Size: Miller and 2 Grade View: Grade II Tube type: Oral Tube size: 7.0 mm Number of attempts: 1 Airway Equipment and Method: Stylet Placement Confirmation: ETT inserted through vocal cords under direct vision,  positive ETCO2,  CO2 detector and breath sounds checked- equal and bilateral Secured at: 21 cm Tube secured with: Tape Dental Injury: Teeth and Oropharynx as per pre-operative assessment

## 2015-05-25 NOTE — Anesthesia Preprocedure Evaluation (Addendum)
Anesthesia Evaluation  Patient identified by MRN, date of birth, ID band Patient awake    Reviewed: Allergy & Precautions, NPO status , Patient's Chart, lab work & pertinent test results  Airway Mallampati: IV  TM Distance: >3 FB Neck ROM: Full    Dental  (+) Poor Dentition, Dental Advisory Given   Pulmonary sleep apnea , COPD,  COPD inhaler, former smoker,     + wheezing      Cardiovascular hypertension, Pt. on medications +CHF and + DOE   Rhythm:Regular Rate:Normal     Neuro/Psych PSYCHIATRIC DISORDERS  Neuromuscular disease    GI/Hepatic Neg liver ROS, GERD  Medicated and Controlled,  Endo/Other  diabetes, Type 1, Insulin Dependent  Renal/GU CRFRenal disease  negative genitourinary   Musculoskeletal   Abdominal   Peds negative pediatric ROS (+)  Hematology   Anesthesia Other Findings   Reproductive/Obstetrics negative OB ROS                           Lab Results  Component Value Date   WBC 7.1 05/18/2015   HGB 10.6* 05/18/2015   HCT 34.4* 05/18/2015   MCV 93.2 05/18/2015   PLT 254 05/18/2015   Lab Results  Component Value Date   CREATININE 2.17* 05/18/2015   BUN 55* 05/18/2015   NA 140 05/18/2015   K 4.8 05/18/2015   CL 107 05/18/2015   CO2 21* 05/18/2015   No results found for: INR, PROTIME  04/2015 EKG: normal sinus rhythm.  04/2015 Echo - Left ventricle: The cavity size was normal. There was mild concentric hypertrophy. Systolic function was normal. The estimated ejection fraction was in the range of 55% to 60%. Wall motion was normal; there were no regional wall motion abnormalities. Features are consistent with a pseudonormal left ventricular filling pattern, with concomitant abnormal relaxation and increased filling pressure (grade 2 diastolic dysfunction). - Aortic valve: There was mild stenosis. Valve area (VTI): 1.74 cm^2. Valve area (Vmax): 1.71 cm^2. Valve  area (Vmean): 1.56 cm^2. - Mitral valve: Severely calcified annulus. Mildly thickened leaflets . The findings are consistent with trivial stenosis. There was mild regurgitation. Valve area by pressure half-time: 2.04 cm^2. Valve area by continuity equation (using LVOT flow): 2.03 cm^2. - Left atrium: The atrium was moderately dilated. - Pulmonary arteries: PA peak pressure: 31 mm Hg (S).    Anesthesia Physical Anesthesia Plan  ASA: III  Anesthesia Plan: General   Post-op Pain Management:    Induction: Intravenous  Airway Management Planned: Oral ETT  Additional Equipment:   Intra-op Plan:   Post-operative Plan: Extubation in OR  Informed Consent: I have reviewed the patients History and Physical, chart, labs and discussed the procedure including the risks, benefits and alternatives for the proposed anesthesia with the patient or authorized representative who has indicated his/her understanding and acceptance.   Dental advisory given  Plan Discussed with:   Anesthesia Plan Comments:         Anesthesia Quick Evaluation

## 2015-05-25 NOTE — Anesthesia Postprocedure Evaluation (Signed)
Anesthesia Post Note  Patient: CARDEN KRIZMAN  Procedure(s) Performed: Procedure(s) (LRB): LAPAROSCOPIC CHOLECYSTECTOMY (N/A)  Patient location during evaluation: PACU Anesthesia Type: General Level of consciousness: awake and alert Pain management: pain level controlled Vital Signs Assessment: post-procedure vital signs reviewed and stable Respiratory status: spontaneous breathing, nonlabored ventilation, respiratory function stable and patient connected to nasal cannula oxygen Cardiovascular status: blood pressure returned to baseline and stable Postop Assessment: no signs of nausea or vomiting Anesthetic complications: no    Last Vitals:  Filed Vitals:   05/25/15 1010 05/25/15 1015  BP:    Pulse: 75 69  Temp:  36.4 C  Resp: 17 17    Last Pain:  Filed Vitals:   05/25/15 1017  PainSc: [place] Gracelee Stemmler

## 2015-05-25 NOTE — Progress Notes (Signed)
Pt continues to have red non raised rash over extremities. No change since coming from PACU.

## 2015-05-25 NOTE — Progress Notes (Signed)
CM spoke to the patient at the bedside. She states that she lives at home with her family and has plenty of support with her children and grandchildren. Patient states that she has a walker, 3N1, shower chair, shower handrails, and hoover round. Patient said that she does not need any additional assistance and has someone from [facility] come to stay with her for 2 hours, 4 days a week. No discharge planning need communicated at this time.

## 2015-05-25 NOTE — Interval H&P Note (Signed)
History and Physical Interval Note: no change in H and P except for a rash  05/25/2015 6:33 AM  Anna [name]  has presented today for surgery, with the diagnosis of Gallstone pancreatitis  The various methods of treatment have been discussed with the patient and family. After consideration of risks, benefits and other options for treatment, the patient has consented to  Procedure(s): LAPAROSCOPIC CHOLECYSTECTOMY (N/A) as a surgical intervention .  The patient's history has been reviewed, patient examined, no change in status, stable for surgery.  I have reviewed the patient's chart and labs.  Questions were answered to the patient's satisfaction.     Adolphus Hanf A

## 2015-05-25 NOTE — Transfer of Care (Signed)
Immediate Anesthesia Transfer of Care Note  Patient: Anna [name]  Procedure(s) Performed: Procedure(s): LAPAROSCOPIC CHOLECYSTECTOMY (N/A)  Patient Location: PACU  Anesthesia Type:General  Level of Consciousness: awake, alert , oriented and patient cooperative  Airway & Oxygen Therapy: Patient Spontanous Breathing and Patient connected to nasal cannula oxygen  Post-op Assessment: Report given to RN and Post -op Vital signs reviewed and stable  Post vital signs: Reviewed  Last Vitals:  Filed Vitals:   05/25/15 0642 05/25/15 0823  BP: 213/70   Pulse:    Temp:  36.7 C  Resp:      Complications: No apparent anesthesia complications

## 2015-05-25 NOTE — Op Note (Signed)

## 2015-05-26 ENCOUNTER — Emergency Department (HOSPITAL_COMMUNITY): Payer: Medicare Other

## 2015-05-26 ENCOUNTER — Telehealth: Payer: Self-pay | Admitting: General Surgery

## 2015-05-26 ENCOUNTER — Encounter (HOSPITAL_COMMUNITY): Payer: Self-pay | Admitting: Surgery

## 2015-05-26 ENCOUNTER — Inpatient Hospital Stay (HOSPITAL_COMMUNITY)
Admission: EM | Admit: 2015-05-26 | Discharge: 2015-06-04 | DRG: 853 | Disposition: A | Discharge status: Skilled Nursing Facility | Payer: Medicare Other | Attending: Internal Medicine | Admitting: Internal Medicine

## 2015-05-26 DIAGNOSIS — M5104 Intervertebral disc disorders with myelopathy, thoracic region: Secondary | ICD-10-CM | POA: Diagnosis present

## 2015-05-26 DIAGNOSIS — IMO0001 Reserved for inherently not codable concepts without codable children: Secondary | ICD-10-CM | POA: Diagnosis present

## 2015-05-26 DIAGNOSIS — Z825 Family history of asthma and other chronic lower respiratory diseases: Secondary | ICD-10-CM

## 2015-05-26 DIAGNOSIS — E1122 Type 2 diabetes mellitus with diabetic chronic kidney disease: Secondary | ICD-10-CM | POA: Diagnosis present

## 2015-05-26 DIAGNOSIS — Z6841 Body Mass Index (BMI) 40.0 and over, adult: Secondary | ICD-10-CM

## 2015-05-26 DIAGNOSIS — K8065 Calculus of gallbladder and bile duct with chronic cholecystitis with obstruction: Secondary | ICD-10-CM | POA: Diagnosis present

## 2015-05-26 DIAGNOSIS — E1140 Type 2 diabetes mellitus with diabetic neuropathy, unspecified: Secondary | ICD-10-CM | POA: Diagnosis present

## 2015-05-26 DIAGNOSIS — F418 Other specified anxiety disorders: Secondary | ICD-10-CM | POA: Diagnosis present

## 2015-05-26 DIAGNOSIS — N184 Chronic kidney disease, stage 4 (severe): Secondary | ICD-10-CM | POA: Diagnosis present

## 2015-05-26 DIAGNOSIS — Z9981 Dependence on supplemental oxygen: Secondary | ICD-10-CM

## 2015-05-26 DIAGNOSIS — E875 Hyperkalemia: Secondary | ICD-10-CM | POA: Diagnosis present

## 2015-05-26 DIAGNOSIS — J189 Pneumonia, unspecified organism: Secondary | ICD-10-CM | POA: Diagnosis present

## 2015-05-26 DIAGNOSIS — R0902 Hypoxemia: Secondary | ICD-10-CM | POA: Diagnosis present

## 2015-05-26 DIAGNOSIS — E1165 Type 2 diabetes mellitus with hyperglycemia: Secondary | ICD-10-CM | POA: Diagnosis present

## 2015-05-26 DIAGNOSIS — Z833 Family history of diabetes mellitus: Secondary | ICD-10-CM

## 2015-05-26 DIAGNOSIS — Z794 Long term (current) use of insulin: Secondary | ICD-10-CM

## 2015-05-26 DIAGNOSIS — F32A Depression, unspecified: Secondary | ICD-10-CM | POA: Diagnosis present

## 2015-05-26 DIAGNOSIS — Y95 Nosocomial condition: Secondary | ICD-10-CM | POA: Diagnosis present

## 2015-05-26 DIAGNOSIS — I5030 Unspecified diastolic (congestive) heart failure: Secondary | ICD-10-CM | POA: Diagnosis present

## 2015-05-26 DIAGNOSIS — L959 Vasculitis limited to the skin, unspecified: Secondary | ICD-10-CM | POA: Diagnosis present

## 2015-05-26 DIAGNOSIS — Z8249 Family history of ischemic heart disease and other diseases of the circulatory system: Secondary | ICD-10-CM

## 2015-05-26 DIAGNOSIS — K8020 Calculus of gallbladder without cholecystitis without obstruction: Secondary | ICD-10-CM | POA: Diagnosis present

## 2015-05-26 DIAGNOSIS — A419 Sepsis, unspecified organism: Principal | ICD-10-CM | POA: Diagnosis present

## 2015-05-26 DIAGNOSIS — M109 Gout, unspecified: Secondary | ICD-10-CM | POA: Diagnosis present

## 2015-05-26 DIAGNOSIS — I5032 Chronic diastolic (congestive) heart failure: Secondary | ICD-10-CM | POA: Diagnosis present

## 2015-05-26 DIAGNOSIS — E872 Acidosis: Secondary | ICD-10-CM | POA: Diagnosis present

## 2015-05-26 DIAGNOSIS — N179 Acute kidney failure, unspecified: Secondary | ICD-10-CM | POA: Diagnosis present

## 2015-05-26 DIAGNOSIS — Z87891 Personal history of nicotine dependence: Secondary | ICD-10-CM

## 2015-05-26 DIAGNOSIS — Z888 Allergy status to other drugs, medicaments and biological substances status: Secondary | ICD-10-CM

## 2015-05-26 DIAGNOSIS — Z7951 Long term (current) use of inhaled steroids: Secondary | ICD-10-CM

## 2015-05-26 DIAGNOSIS — F329 Major depressive disorder, single episode, unspecified: Secondary | ICD-10-CM | POA: Diagnosis present

## 2015-05-26 DIAGNOSIS — F411 Generalized anxiety disorder: Secondary | ICD-10-CM | POA: Diagnosis present

## 2015-05-26 DIAGNOSIS — J440 Chronic obstructive pulmonary disease with acute lower respiratory infection: Secondary | ICD-10-CM | POA: Diagnosis present

## 2015-05-26 DIAGNOSIS — E662 Morbid (severe) obesity with alveolar hypoventilation: Secondary | ICD-10-CM | POA: Diagnosis present

## 2015-05-26 DIAGNOSIS — J449 Chronic obstructive pulmonary disease, unspecified: Secondary | ICD-10-CM | POA: Diagnosis present

## 2015-05-26 DIAGNOSIS — I130 Hypertensive heart and chronic kidney disease with heart failure and stage 1 through stage 4 chronic kidney disease, or unspecified chronic kidney disease: Secondary | ICD-10-CM | POA: Diagnosis present

## 2015-05-26 DIAGNOSIS — R0602 Shortness of breath: Secondary | ICD-10-CM

## 2015-05-26 DIAGNOSIS — Z7982 Long term (current) use of aspirin: Secondary | ICD-10-CM

## 2015-05-26 DIAGNOSIS — Z66 Do not resuscitate: Secondary | ICD-10-CM | POA: Diagnosis present

## 2015-05-26 DIAGNOSIS — I10 Essential (primary) hypertension: Secondary | ICD-10-CM | POA: Diagnosis present

## 2015-05-26 DIAGNOSIS — Z9049 Acquired absence of other specified parts of digestive tract: Secondary | ICD-10-CM

## 2015-05-26 DIAGNOSIS — E538 Deficiency of other specified B group vitamins: Secondary | ICD-10-CM | POA: Diagnosis present

## 2015-05-26 DIAGNOSIS — G894 Chronic pain syndrome: Secondary | ICD-10-CM | POA: Diagnosis present

## 2015-05-26 DIAGNOSIS — N189 Chronic kidney disease, unspecified: Secondary | ICD-10-CM

## 2015-05-26 DIAGNOSIS — Z79899 Other long term (current) drug therapy: Secondary | ICD-10-CM

## 2015-05-26 DIAGNOSIS — K219 Gastro-esophageal reflux disease without esophagitis: Secondary | ICD-10-CM | POA: Diagnosis present

## 2015-05-26 DIAGNOSIS — R21 Rash and other nonspecific skin eruption: Secondary | ICD-10-CM | POA: Diagnosis present

## 2015-05-26 DIAGNOSIS — E785 Hyperlipidemia, unspecified: Secondary | ICD-10-CM | POA: Diagnosis present

## 2015-05-26 DIAGNOSIS — M351 Other overlap syndromes: Secondary | ICD-10-CM | POA: Diagnosis present

## 2015-05-26 DIAGNOSIS — D509 Iron deficiency anemia, unspecified: Secondary | ICD-10-CM | POA: Diagnosis present

## 2015-05-26 DIAGNOSIS — D62 Acute posthemorrhagic anemia: Secondary | ICD-10-CM | POA: Diagnosis not present

## 2015-05-26 DIAGNOSIS — E119 Type 2 diabetes mellitus without complications: Secondary | ICD-10-CM

## 2015-05-26 HISTORY — DX: Pneumonia, unspecified organism: J18.9

## 2015-05-26 LAB — CBC WITH DIFFERENTIAL/PLATELET
BASOS PCT: 0 %
Basophils Absolute: 0 10*3/uL (ref 0.0–0.1)
EOS PCT: 0 %
Eosinophils Absolute: 0 10*3/uL (ref 0.0–0.7)
HEMATOCRIT: 30 % — AB (ref 36.0–46.0)
Hemoglobin: 9.4 g/dL — ABNORMAL LOW (ref 12.0–15.0)
Lymphocytes Relative: 5 %
Lymphs Abs: 0.8 10*3/uL (ref 0.7–4.0)
MCH: 29.3 pg (ref 26.0–34.0)
MCHC: 31.3 g/dL (ref 30.0–36.0)
MCV: 93.5 fL (ref 78.0–100.0)
MONO ABS: 0.5 10*3/uL (ref 0.1–1.0)
MONOS PCT: 3 %
NEUTROS ABS: 14.2 10*3/uL — AB (ref 1.7–7.7)
Neutrophils Relative %: 92 %
Platelets: 268 10*3/uL (ref 150–400)
RBC: 3.21 MIL/uL — ABNORMAL LOW (ref 3.87–5.11)
RDW: 16.2 % — AB (ref 11.5–15.5)
WBC: 15.5 10*3/uL — ABNORMAL HIGH (ref 4.0–10.5)

## 2015-05-26 LAB — COMPREHENSIVE METABOLIC PANEL
ALBUMIN: 2.2 g/dL — AB (ref 3.5–5.0)
ALT: 12 U/L — ABNORMAL LOW (ref 14–54)
ANION GAP: 14 (ref 5–15)
AST: 30 U/L (ref 15–41)
Alkaline Phosphatase: 48 U/L (ref 38–126)
BILIRUBIN TOTAL: 0.9 mg/dL (ref 0.3–1.2)
BUN: 72 mg/dL — ABNORMAL HIGH (ref 6–20)
CO2: 19 mmol/L — ABNORMAL LOW (ref 22–32)
Calcium: 8.3 mg/dL — ABNORMAL LOW (ref 8.9–10.3)
Chloride: 106 mmol/L (ref 101–111)
Creatinine, Ser: 4 mg/dL — ABNORMAL HIGH (ref 0.44–1.00)
GFR calc Af Amer: 11 mL/min — ABNORMAL LOW (ref >60)
GFR, EST NON AFRICAN AMERICAN: 10 mL/min — AB (ref >60)
Glucose, Bld: 155 mg/dL — ABNORMAL HIGH (ref 65–99)
POTASSIUM: 5 mmol/L (ref 3.5–5.1)
Sodium: 139 mmol/L (ref 135–145)
TOTAL PROTEIN: 5.6 g/dL — AB (ref 6.5–8.1)

## 2015-05-26 LAB — GLUCOSE, CAPILLARY
GLUCOSE-CAPILLARY: 222 mg/dL — AB (ref 65–99)
GLUCOSE-CAPILLARY: 208 mg/dL — AB (ref 65–99)

## 2015-05-26 LAB — I-STAT CG4 LACTIC ACID, ED: Lactic Acid, Venous: 0.92 mmol/L (ref 0.5–2.0)

## 2015-05-26 MED ORDER — OXYCODONE HCL 5 MG PO TABS: 5.0000 mg | ORAL_TABLET | ORAL | Status: DC | PRN

## 2015-05-26 MED ORDER — SODIUM CHLORIDE 0.9 % IV BOLUS (SEPSIS): 500.0000 mL | INTRAVENOUS | Status: DC

## 2015-05-26 MED ORDER — PIPERACILLIN-TAZOBACTAM 3.375 G IVPB 30 MIN
3.3750 g | Freq: Once | INTRAVENOUS | Status: AC
  Administered 2015-05-26: 3.375 g via INTRAVENOUS
  Filled 2015-05-26: qty 50

## 2015-05-26 MED ORDER — SODIUM CHLORIDE 0.9 % IV SOLN
2000.0000 mg | Freq: Once | INTRAVENOUS | Status: AC
  Administered 2015-05-26: 2000 mg via INTRAVENOUS
  Filled 2015-05-26: qty 2000

## 2015-05-26 MED ORDER — SODIUM CHLORIDE 0.9 % IV BOLUS (SEPSIS)
1000.0000 mL | INTRAVENOUS | Status: DC
  Administered 2015-05-26: 1000 mL via INTRAVENOUS

## 2015-05-26 NOTE — Progress Notes (Signed)
Discharge instructions reviewed with pt and pt's daughter and prescription given.  Pt and pt's daughter verbalized understanding and had no questions.  Pt discharged in stable condition via wheelchair with daughter.  Anna [name]

## 2015-05-26 NOTE — Progress Notes (Signed)
Patient ID: Anna [name], female   DOB: 1935/01/22, 81 y.o.   MRN: [id number]  Doing well No complaints Lungs clear Abdomen soft  Plan: Discharge home

## 2015-05-26 NOTE — Telephone Encounter (Signed)
Was able to get in touch with the patient's family.  Pts O2 sats in the 80's and fever to 101.5. Recommended they get the pt to the ED for evaluation.

## 2015-05-26 NOTE — ED Notes (Signed)
Pt here for fever and hypoxia, ams today, on arrival here pt cao x 4, pt had gall bladder sx yesterday, pt denies pain,

## 2015-05-26 NOTE — Telephone Encounter (Signed)
Pt called with a message that she had a fever to 101.5 after having surgery today.  Attempted to call patient back but no one answered.

## 2015-05-27 ENCOUNTER — Other Ambulatory Visit: Payer: Self-pay | Admitting: Nurse Practitioner

## 2015-05-27 ENCOUNTER — Inpatient Hospital Stay (HOSPITAL_COMMUNITY): Payer: Medicare Other

## 2015-05-27 ENCOUNTER — Encounter (HOSPITAL_COMMUNITY): Payer: Self-pay | Admitting: Surgery

## 2015-05-27 DIAGNOSIS — R0902 Hypoxemia: Secondary | ICD-10-CM | POA: Diagnosis present

## 2015-05-27 DIAGNOSIS — D501 Sideropenic dysphagia: Secondary | ICD-10-CM | POA: Diagnosis not present

## 2015-05-27 DIAGNOSIS — Z86010 Personal history of colonic polyps: Secondary | ICD-10-CM | POA: Diagnosis not present

## 2015-05-27 DIAGNOSIS — Z6841 Body Mass Index (BMI) 40.0 and over, adult: Secondary | ICD-10-CM | POA: Diagnosis not present

## 2015-05-27 DIAGNOSIS — D510 Vitamin B12 deficiency anemia due to intrinsic factor deficiency: Secondary | ICD-10-CM | POA: Diagnosis not present

## 2015-05-27 DIAGNOSIS — L959 Vasculitis limited to the skin, unspecified: Secondary | ICD-10-CM | POA: Diagnosis not present

## 2015-05-27 DIAGNOSIS — J441 Chronic obstructive pulmonary disease with (acute) exacerbation: Secondary | ICD-10-CM | POA: Diagnosis not present

## 2015-05-27 DIAGNOSIS — E785 Hyperlipidemia, unspecified: Secondary | ICD-10-CM | POA: Diagnosis not present

## 2015-05-27 DIAGNOSIS — Z7951 Long term (current) use of inhaled steroids: Secondary | ICD-10-CM | POA: Diagnosis not present

## 2015-05-27 DIAGNOSIS — G4733 Obstructive sleep apnea (adult) (pediatric): Secondary | ICD-10-CM | POA: Diagnosis not present

## 2015-05-27 DIAGNOSIS — G2581 Restless legs syndrome: Secondary | ICD-10-CM | POA: Diagnosis not present

## 2015-05-27 DIAGNOSIS — Z8249 Family history of ischemic heart disease and other diseases of the circulatory system: Secondary | ICD-10-CM | POA: Diagnosis not present

## 2015-05-27 DIAGNOSIS — G894 Chronic pain syndrome: Secondary | ICD-10-CM | POA: Diagnosis present

## 2015-05-27 DIAGNOSIS — K8511 Biliary acute pancreatitis with uninfected necrosis: Secondary | ICD-10-CM | POA: Diagnosis not present

## 2015-05-27 DIAGNOSIS — D509 Iron deficiency anemia, unspecified: Secondary | ICD-10-CM

## 2015-05-27 DIAGNOSIS — Z87891 Personal history of nicotine dependence: Secondary | ICD-10-CM | POA: Diagnosis not present

## 2015-05-27 DIAGNOSIS — Z66 Do not resuscitate: Secondary | ICD-10-CM | POA: Diagnosis present

## 2015-05-27 DIAGNOSIS — Z7982 Long term (current) use of aspirin: Secondary | ICD-10-CM | POA: Diagnosis not present

## 2015-05-27 DIAGNOSIS — I5030 Unspecified diastolic (congestive) heart failure: Secondary | ICD-10-CM | POA: Diagnosis present

## 2015-05-27 DIAGNOSIS — R310 Gross hematuria: Secondary | ICD-10-CM | POA: Diagnosis not present

## 2015-05-27 DIAGNOSIS — M1000 Idiopathic gout, unspecified site: Secondary | ICD-10-CM | POA: Diagnosis not present

## 2015-05-27 DIAGNOSIS — Z79899 Other long term (current) drug therapy: Secondary | ICD-10-CM | POA: Diagnosis not present

## 2015-05-27 DIAGNOSIS — E872 Acidosis: Secondary | ICD-10-CM | POA: Diagnosis present

## 2015-05-27 DIAGNOSIS — A419 Sepsis, unspecified organism: Secondary | ICD-10-CM | POA: Diagnosis not present

## 2015-05-27 DIAGNOSIS — M5104 Intervertebral disc disorders with myelopathy, thoracic region: Secondary | ICD-10-CM | POA: Diagnosis present

## 2015-05-27 DIAGNOSIS — F418 Other specified anxiety disorders: Secondary | ICD-10-CM | POA: Diagnosis present

## 2015-05-27 DIAGNOSIS — I10 Essential (primary) hypertension: Secondary | ICD-10-CM | POA: Diagnosis not present

## 2015-05-27 DIAGNOSIS — R8299 Other abnormal findings in urine: Secondary | ICD-10-CM | POA: Diagnosis not present

## 2015-05-27 DIAGNOSIS — Z825 Family history of asthma and other chronic lower respiratory diseases: Secondary | ICD-10-CM | POA: Diagnosis not present

## 2015-05-27 DIAGNOSIS — E1129 Type 2 diabetes mellitus with other diabetic kidney complication: Secondary | ICD-10-CM | POA: Diagnosis not present

## 2015-05-27 DIAGNOSIS — E1140 Type 2 diabetes mellitus with diabetic neuropathy, unspecified: Secondary | ICD-10-CM | POA: Diagnosis not present

## 2015-05-27 DIAGNOSIS — R0602 Shortness of breath: Secondary | ICD-10-CM | POA: Diagnosis not present

## 2015-05-27 DIAGNOSIS — D631 Anemia in chronic kidney disease: Secondary | ICD-10-CM | POA: Diagnosis not present

## 2015-05-27 DIAGNOSIS — J440 Chronic obstructive pulmonary disease with acute lower respiratory infection: Secondary | ICD-10-CM | POA: Diagnosis present

## 2015-05-27 DIAGNOSIS — R21 Rash and other nonspecific skin eruption: Secondary | ICD-10-CM | POA: Diagnosis not present

## 2015-05-27 DIAGNOSIS — Z794 Long term (current) use of insulin: Secondary | ICD-10-CM | POA: Diagnosis not present

## 2015-05-27 DIAGNOSIS — F329 Major depressive disorder, single episode, unspecified: Secondary | ICD-10-CM | POA: Diagnosis not present

## 2015-05-27 DIAGNOSIS — E538 Deficiency of other specified B group vitamins: Secondary | ICD-10-CM | POA: Diagnosis present

## 2015-05-27 DIAGNOSIS — E1122 Type 2 diabetes mellitus with diabetic chronic kidney disease: Secondary | ICD-10-CM | POA: Diagnosis not present

## 2015-05-27 DIAGNOSIS — J189 Pneumonia, unspecified organism: Secondary | ICD-10-CM | POA: Diagnosis not present

## 2015-05-27 DIAGNOSIS — E875 Hyperkalemia: Secondary | ICD-10-CM | POA: Diagnosis present

## 2015-05-27 DIAGNOSIS — E119 Type 2 diabetes mellitus without complications: Secondary | ICD-10-CM | POA: Diagnosis not present

## 2015-05-27 DIAGNOSIS — Z9981 Dependence on supplemental oxygen: Secondary | ICD-10-CM | POA: Diagnosis not present

## 2015-05-27 DIAGNOSIS — M1991 Primary osteoarthritis, unspecified site: Secondary | ICD-10-CM | POA: Diagnosis not present

## 2015-05-27 DIAGNOSIS — I130 Hypertensive heart and chronic kidney disease with heart failure and stage 1 through stage 4 chronic kidney disease, or unspecified chronic kidney disease: Secondary | ICD-10-CM | POA: Diagnosis present

## 2015-05-27 DIAGNOSIS — K8001 Calculus of gallbladder with acute cholecystitis with obstruction: Secondary | ICD-10-CM | POA: Diagnosis not present

## 2015-05-27 DIAGNOSIS — K219 Gastro-esophageal reflux disease without esophagitis: Secondary | ICD-10-CM | POA: Diagnosis not present

## 2015-05-27 DIAGNOSIS — I129 Hypertensive chronic kidney disease with stage 1 through stage 4 chronic kidney disease, or unspecified chronic kidney disease: Secondary | ICD-10-CM | POA: Diagnosis not present

## 2015-05-27 DIAGNOSIS — Z888 Allergy status to other drugs, medicaments and biological substances status: Secondary | ICD-10-CM | POA: Diagnosis not present

## 2015-05-27 DIAGNOSIS — N179 Acute kidney failure, unspecified: Secondary | ICD-10-CM | POA: Diagnosis not present

## 2015-05-27 DIAGNOSIS — K8065 Calculus of gallbladder and bile duct with chronic cholecystitis with obstruction: Secondary | ICD-10-CM | POA: Diagnosis present

## 2015-05-27 DIAGNOSIS — Z9049 Acquired absence of other specified parts of digestive tract: Secondary | ICD-10-CM | POA: Diagnosis not present

## 2015-05-27 DIAGNOSIS — J439 Emphysema, unspecified: Secondary | ICD-10-CM | POA: Diagnosis not present

## 2015-05-27 DIAGNOSIS — F411 Generalized anxiety disorder: Secondary | ICD-10-CM | POA: Diagnosis not present

## 2015-05-27 DIAGNOSIS — D62 Acute posthemorrhagic anemia: Secondary | ICD-10-CM | POA: Diagnosis not present

## 2015-05-27 DIAGNOSIS — I120 Hypertensive chronic kidney disease with stage 5 chronic kidney disease or end stage renal disease: Secondary | ICD-10-CM | POA: Diagnosis not present

## 2015-05-27 DIAGNOSIS — J438 Other emphysema: Secondary | ICD-10-CM | POA: Diagnosis not present

## 2015-05-27 DIAGNOSIS — M109 Gout, unspecified: Secondary | ICD-10-CM | POA: Diagnosis present

## 2015-05-27 DIAGNOSIS — Y95 Nosocomial condition: Secondary | ICD-10-CM | POA: Diagnosis present

## 2015-05-27 DIAGNOSIS — M351 Other overlap syndromes: Secondary | ICD-10-CM | POA: Diagnosis not present

## 2015-05-27 DIAGNOSIS — L578 Other skin changes due to chronic exposure to nonionizing radiation: Secondary | ICD-10-CM | POA: Diagnosis not present

## 2015-05-27 DIAGNOSIS — N189 Chronic kidney disease, unspecified: Secondary | ICD-10-CM | POA: Diagnosis not present

## 2015-05-27 DIAGNOSIS — Z833 Family history of diabetes mellitus: Secondary | ICD-10-CM | POA: Diagnosis not present

## 2015-05-27 DIAGNOSIS — E1165 Type 2 diabetes mellitus with hyperglycemia: Secondary | ICD-10-CM | POA: Diagnosis present

## 2015-05-27 DIAGNOSIS — N184 Chronic kidney disease, stage 4 (severe): Secondary | ICD-10-CM | POA: Diagnosis not present

## 2015-05-27 DIAGNOSIS — I5032 Chronic diastolic (congestive) heart failure: Secondary | ICD-10-CM | POA: Diagnosis not present

## 2015-05-27 DIAGNOSIS — R079 Chest pain, unspecified: Secondary | ICD-10-CM | POA: Diagnosis not present

## 2015-05-27 DIAGNOSIS — E662 Morbid (severe) obesity with alveolar hypoventilation: Secondary | ICD-10-CM | POA: Diagnosis present

## 2015-05-27 LAB — BASIC METABOLIC PANEL
Anion gap: 10 (ref 5–15)
BUN: 71 mg/dL — ABNORMAL HIGH (ref 6–20)
CALCIUM: 7.7 mg/dL — AB (ref 8.9–10.3)
CHLORIDE: 111 mmol/L (ref 101–111)
CO2: 19 mmol/L — AB (ref 22–32)
CREATININE: 3.87 mg/dL — AB (ref 0.44–1.00)
GFR calc non Af Amer: 10 mL/min — ABNORMAL LOW (ref >60)
GFR, EST AFRICAN AMERICAN: 12 mL/min — AB (ref >60)
GLUCOSE: 71 mg/dL (ref 65–99)
Potassium: 5 mmol/L (ref 3.5–5.1)
Sodium: 140 mmol/L (ref 135–145)

## 2015-05-27 LAB — URINALYSIS, ROUTINE W REFLEX MICROSCOPIC
Glucose, UA: NEGATIVE mg/dL
Ketones, ur: NEGATIVE mg/dL
NITRITE: NEGATIVE
PH: 5 (ref 5.0–8.0)
Protein, ur: >300 mg/dL — AB
SPECIFIC GRAVITY, URINE: 1.019 (ref 1.005–1.030)

## 2015-05-27 LAB — GLUCOSE, CAPILLARY
GLUCOSE-CAPILLARY: 72 mg/dL (ref 65–99)
GLUCOSE-CAPILLARY: 67 mg/dL (ref 65–99)
Glucose-Capillary: 61 mg/dL — ABNORMAL LOW (ref 65–99)
Glucose-Capillary: 119 mg/dL — ABNORMAL HIGH (ref 65–99)

## 2015-05-27 LAB — LACTIC ACID, PLASMA
LACTIC ACID, VENOUS: 0.7 mmol/L (ref 0.5–2.0)
Lactic Acid, Venous: 0.7 mmol/L (ref 0.5–2.0)

## 2015-05-27 LAB — URINE MICROSCOPIC-ADD ON

## 2015-05-27 LAB — SEDIMENTATION RATE: SED RATE: 97 mm/h — AB (ref 0–22)

## 2015-05-27 LAB — BRAIN NATRIURETIC PEPTIDE: B NATRIURETIC PEPTIDE 5: 376.7 pg/mL — AB (ref 0.0–100.0)

## 2015-05-27 LAB — IRON AND TIBC
IRON: 7 ug/dL — AB (ref 28–170)
Saturation Ratios: 3 % — ABNORMAL LOW (ref 10.4–31.8)
TIBC: 225 ug/dL — AB (ref 250–450)
UIBC: 218 ug/dL

## 2015-05-27 LAB — PROCALCITONIN: PROCALCITONIN: 15.23 ng/mL

## 2015-05-27 LAB — TROPONIN I
TROPONIN I: 0.05 ng/mL — AB (ref <0.031)
TROPONIN I: 0.05 ng/mL — AB (ref <0.031)
Troponin I: 0.06 ng/mL — ABNORMAL HIGH (ref <0.031)

## 2015-05-27 LAB — PROTIME-INR
INR: 1.35 (ref 0.00–1.49)
PROTHROMBIN TIME: 16.8 s — AB (ref 11.6–15.2)

## 2015-05-27 LAB — CREATININE, URINE, RANDOM: Creatinine, Urine: 148.81 mg/dL

## 2015-05-27 LAB — CBG MONITORING, ED
GLUCOSE-CAPILLARY: 63 mg/dL — AB (ref 65–99)
Glucose-Capillary: 158 mg/dL — ABNORMAL HIGH (ref 65–99)

## 2015-05-27 LAB — FERRITIN: FERRITIN: 131 ng/mL (ref 11–307)

## 2015-05-27 LAB — INFLUENZA PANEL BY PCR (TYPE A & B)
H1N1 flu by pcr: NOT DETECTED
Influenza A By PCR: NEGATIVE
Influenza B By PCR: NEGATIVE

## 2015-05-27 LAB — APTT: aPTT: 37 seconds (ref 24–37)

## 2015-05-27 LAB — STREP PNEUMONIAE URINARY ANTIGEN: STREP PNEUMO URINARY ANTIGEN: NEGATIVE

## 2015-05-27 MED ORDER — ROSUVASTATIN CALCIUM 10 MG PO TABS
10.0000 mg | ORAL_TABLET | Freq: Every day | ORAL | Status: DC
  Administered 2015-05-27 – 2015-06-04 (×8): 10 mg via ORAL
  Filled 2015-05-27 (×9): qty 1

## 2015-05-27 MED ORDER — BACITRACIN-NEOMYCIN-POLYMYXIN OINTMENT TUBE
TOPICAL_OINTMENT | Freq: Every day | CUTANEOUS | Status: DC
  Administered 2015-05-27 – 2015-06-04 (×8): via TOPICAL
  Filled 2015-05-27 (×2): qty 15

## 2015-05-27 MED ORDER — BUDESONIDE-FORMOTEROL FUMARATE 80-4.5 MCG/ACT IN AERO
1.0000 | INHALATION_SPRAY | Freq: Two times a day (BID) | RESPIRATORY_TRACT | Status: DC
  Administered 2015-05-27 (×2): 1 via RESPIRATORY_TRACT
  Filled 2015-05-27: qty 6.9

## 2015-05-27 MED ORDER — DM-GUAIFENESIN ER 30-600 MG PO TB12: 1.0000 | ORAL_TABLET | Freq: Two times a day (BID) | ORAL | Status: DC | PRN

## 2015-05-27 MED ORDER — ASPIRIN 81 MG PO CHEW
81.0000 mg | CHEWABLE_TABLET | Freq: Every day | ORAL | Status: DC
  Administered 2015-05-27 – 2015-06-04 (×9): 81 mg via ORAL
  Filled 2015-05-27 (×9): qty 1

## 2015-05-27 MED ORDER — IRON 325 (65 FE) MG PO TABS: 1.0000 | ORAL_TABLET | Freq: Two times a day (BID) | ORAL | Status: DC

## 2015-05-27 MED ORDER — CLONIDINE HCL 0.1 MG PO TABS
0.1000 mg | ORAL_TABLET | Freq: Two times a day (BID) | ORAL | Status: DC
  Administered 2015-05-27: 0.1 mg via ORAL
  Filled 2015-05-27: qty 1

## 2015-05-27 MED ORDER — INSULIN ASPART PROT & ASPART (70-30 MIX) 100 UNIT/ML ~~LOC~~ SUSP
65.0000 [IU] | Freq: Every day | SUBCUTANEOUS | Status: DC
  Administered 2015-05-28 – 2015-05-29 (×2): 65 [IU] via SUBCUTANEOUS
  Filled 2015-05-27: qty 10

## 2015-05-27 MED ORDER — ISOSORBIDE MONONITRATE ER 30 MG PO TB24
30.0000 mg | ORAL_TABLET | Freq: Every day | ORAL | Status: DC
  Administered 2015-05-27 – 2015-05-29 (×3): 30 mg via ORAL
  Filled 2015-05-27 (×3): qty 1

## 2015-05-27 MED ORDER — OXYCODONE-ACETAMINOPHEN 10-325 MG PO TABS: 1.0000 | ORAL_TABLET | Freq: Two times a day (BID) | ORAL | Status: DC

## 2015-05-27 MED ORDER — FEBUXOSTAT 40 MG PO TABS
40.0000 mg | ORAL_TABLET | Freq: Every day | ORAL | Status: DC
  Administered 2015-05-27 – 2015-06-04 (×9): 40 mg via ORAL
  Filled 2015-05-27 (×9): qty 1

## 2015-05-27 MED ORDER — OXYCODONE HCL 5 MG PO TABS
5.0000 mg | ORAL_TABLET | Freq: Two times a day (BID) | ORAL | Status: DC
  Administered 2015-05-27 – 2015-06-04 (×15): 5 mg via ORAL
  Filled 2015-05-27 (×14): qty 1

## 2015-05-27 MED ORDER — OXYCODONE HCL 5 MG PO TABS
5.0000 mg | ORAL_TABLET | ORAL | Status: DC | PRN
  Administered 2015-05-28 – 2015-06-01 (×2): 5 mg via ORAL
  Filled 2015-05-27 (×3): qty 1

## 2015-05-27 MED ORDER — FEBUXOSTAT 40 MG PO TABS: 40.0000 mg | ORAL_TABLET | Freq: Every day | ORAL | Status: DC

## 2015-05-27 MED ORDER — HYDRALAZINE HCL 20 MG/ML IJ SOLN: 5.0000 mg | INTRAMUSCULAR | Status: DC | PRN

## 2015-05-27 MED ORDER — ACETAMINOPHEN 325 MG PO TABS
650.0000 mg | ORAL_TABLET | ORAL | Status: DC | PRN
  Administered 2015-05-27 – 2015-05-31 (×3): 650 mg via ORAL
  Filled 2015-05-27 (×3): qty 2

## 2015-05-27 MED ORDER — INSULIN ASPART PROT & ASPART (70-30 MIX) 100 UNIT/ML ~~LOC~~ SUSP: 55.0000 [IU] | Freq: Two times a day (BID) | SUBCUTANEOUS | Status: DC

## 2015-05-27 MED ORDER — OXYCODONE-ACETAMINOPHEN 5-325 MG PO TABS
1.0000 | ORAL_TABLET | Freq: Two times a day (BID) | ORAL | Status: DC
  Administered 2015-05-28 – 2015-06-03 (×12): 1 via ORAL
  Filled 2015-05-27 (×13): qty 1

## 2015-05-27 MED ORDER — CITALOPRAM HYDROBROMIDE 20 MG PO TABS
20.0000 mg | ORAL_TABLET | Freq: Every day | ORAL | Status: DC
  Administered 2015-05-27 – 2015-06-04 (×9): 20 mg via ORAL
  Filled 2015-05-27 (×2): qty 1
  Filled 2015-05-27: qty 2
  Filled 2015-05-27 (×6): qty 1

## 2015-05-27 MED ORDER — FENOFIBRATE 160 MG PO TABS
160.0000 mg | ORAL_TABLET | Freq: Every day | ORAL | Status: DC
  Administered 2015-05-27 – 2015-06-04 (×9): 160 mg via ORAL
  Filled 2015-05-27 (×9): qty 1

## 2015-05-27 MED ORDER — NYSTATIN 100000 UNIT/GM EX CREA
TOPICAL_CREAM | Freq: Two times a day (BID) | CUTANEOUS | Status: DC
  Administered 2015-05-27: 1 via TOPICAL
  Administered 2015-05-27 – 2015-05-28 (×2): via TOPICAL
  Administered 2015-05-28: 1 via TOPICAL
  Administered 2015-05-29 – 2015-06-04 (×11): via TOPICAL
  Filled 2015-05-27 (×3): qty 15

## 2015-05-27 MED ORDER — OMEGA-3-ACID ETHYL ESTERS 1 G PO CAPS
1.0000 g | ORAL_CAPSULE | Freq: Every day | ORAL | Status: DC
  Administered 2015-05-27 – 2015-06-04 (×9): 1 g via ORAL
  Filled 2015-05-27 (×9): qty 1

## 2015-05-27 MED ORDER — INSULIN ASPART 100 UNIT/ML ~~LOC~~ SOLN
0.0000 [IU] | Freq: Three times a day (TID) | SUBCUTANEOUS | Status: DC
  Administered 2015-05-27: 2 [IU] via SUBCUTANEOUS
  Administered 2015-05-28: 3 [IU] via SUBCUTANEOUS
  Administered 2015-05-28: 1 [IU] via SUBCUTANEOUS
  Administered 2015-05-28: 2 [IU] via SUBCUTANEOUS
  Administered 2015-05-29 (×2): 3 [IU] via SUBCUTANEOUS
  Administered 2015-05-29: 2 [IU] via SUBCUTANEOUS
  Filled 2015-05-27: qty 1

## 2015-05-27 MED ORDER — ALBUTEROL SULFATE (2.5 MG/3ML) 0.083% IN NEBU
2.5000 mg | INHALATION_SOLUTION | RESPIRATORY_TRACT | Status: DC | PRN
  Administered 2015-05-27: 2.5 mg via RESPIRATORY_TRACT
  Filled 2015-05-27: qty 3

## 2015-05-27 MED ORDER — POLYETHYLENE GLYCOL 3350 17 G PO PACK
17.0000 g | PACK | Freq: Every day | ORAL | Status: DC | PRN
  Filled 2015-05-27: qty 1

## 2015-05-27 MED ORDER — PIPERACILLIN-TAZOBACTAM IN DEX 2-0.25 GM/50ML IV SOLN
2.2500 g | Freq: Three times a day (TID) | INTRAVENOUS | Status: DC
  Administered 2015-05-27 – 2015-05-31 (×11): 2.25 g via INTRAVENOUS
  Filled 2015-05-27 (×18): qty 50

## 2015-05-27 MED ORDER — FUROSEMIDE 80 MG PO TABS
80.0000 mg | ORAL_TABLET | Freq: Two times a day (BID) | ORAL | Status: DC
  Administered 2015-05-27 – 2015-06-02 (×12): 80 mg via ORAL
  Filled 2015-05-27 (×12): qty 1

## 2015-05-27 MED ORDER — CARVEDILOL 25 MG PO TABS
25.0000 mg | ORAL_TABLET | Freq: Two times a day (BID) | ORAL | Status: DC
  Administered 2015-05-27 – 2015-06-04 (×17): 25 mg via ORAL
  Filled 2015-05-27 (×5): qty 1
  Filled 2015-05-27: qty 2
  Filled 2015-05-27 (×11): qty 1

## 2015-05-27 MED ORDER — HEPARIN SODIUM (PORCINE) 5000 UNIT/ML IJ SOLN
5000.0000 [IU] | Freq: Three times a day (TID) | INTRAMUSCULAR | Status: DC
  Administered 2015-05-27 – 2015-06-02 (×19): 5000 [IU] via SUBCUTANEOUS
  Filled 2015-05-27 (×19): qty 1

## 2015-05-27 MED ORDER — PANTOPRAZOLE SODIUM 40 MG PO TBEC
40.0000 mg | DELAYED_RELEASE_TABLET | Freq: Every day | ORAL | Status: DC
  Administered 2015-05-27 – 2015-06-04 (×9): 40 mg via ORAL
  Filled 2015-05-27 (×9): qty 1

## 2015-05-27 MED ORDER — GABAPENTIN 300 MG PO CAPS
300.0000 mg | ORAL_CAPSULE | Freq: Three times a day (TID) | ORAL | Status: DC
  Administered 2015-05-27: 300 mg via ORAL
  Filled 2015-05-27: qty 1

## 2015-05-27 MED ORDER — SODIUM CHLORIDE 0.9 % IV BOLUS (SEPSIS)
1000.0000 mL | Freq: Once | INTRAVENOUS | Status: AC
  Administered 2015-05-27: 1000 mL via INTRAVENOUS

## 2015-05-27 MED ORDER — AMLODIPINE BESYLATE 10 MG PO TABS
10.0000 mg | ORAL_TABLET | Freq: Every day | ORAL | Status: DC
  Administered 2015-05-29 – 2015-06-04 (×7): 10 mg via ORAL
  Filled 2015-05-27 (×7): qty 1

## 2015-05-27 MED ORDER — INSULIN ASPART PROT & ASPART (70-30 MIX) 100 UNIT/ML ~~LOC~~ SUSP
55.0000 [IU] | Freq: Every day | SUBCUTANEOUS | Status: DC
  Administered 2015-05-27 – 2015-06-04 (×3): 55 [IU] via SUBCUTANEOUS
  Filled 2015-05-27 (×2): qty 10

## 2015-05-27 MED ORDER — SODIUM CHLORIDE 0.9 % IV SOLN
INTRAVENOUS | Status: DC
  Administered 2015-05-27: 03:00:00 via INTRAVENOUS

## 2015-05-27 MED ORDER — CLONIDINE HCL 0.1 MG PO TABS
0.2000 mg | ORAL_TABLET | Freq: Two times a day (BID) | ORAL | Status: DC
  Administered 2015-05-27 – 2015-05-29 (×5): 0.2 mg via ORAL
  Filled 2015-05-27 (×5): qty 2

## 2015-05-27 MED ORDER — VANCOMYCIN HCL 10 G IV SOLR
1500.0000 mg | INTRAVENOUS | Status: DC
  Administered 2015-05-28 – 2015-05-29 (×2): 1500 mg via INTRAVENOUS
  Filled 2015-05-27: qty 1500

## 2015-05-27 NOTE — H&P (Signed)
[facility] History and Physical  ITZELLE HERDEGEN [id number] DOB: [birth year] DOA: 05/26/2015  Referring physician: ED physician PCP: Chevis Pretty, FNP  Specialists:   Chief Complaint: Fever, cough, shortness of breath  HPI: DIORE KAPOLKA is a 81 y.o. female with PMH of hypertension, hyperlipidemia, diabetes mellitus, COPD, GERD, gout, depression, anxiety, symptomatic gallbladder stone, s/p of laparoscopic cholecystectomy on 05/25/15, obesity, chronic kidney disease-stage IV, anemia, diastolic congestive heart failure, who presents with fever, cough, shortness of breath.  Pt was hospitalized from 1/23 to 05/26/15 due to symptomatic cholelithiasis. She had laparoscopic cholecystectomy by dr. Ninfa Linden on 05/25/15. She was just discharged home in the morning. She reports that she started having dry cough, fever, chills, shortness of breath after she went home. She also has mild chest pain acrossing front the chest. She states that she has mild pain over surgical sites, but no abdominal pain, diarrhea, symptoms of UTI. Patient has rashes over both lower legs which started on 05/02/15. She does not have unilateral weakness, hematuria, hematochezia, nausea, vomiting.  In ED, patient was found to have WBC 15.5, temperature 101.5, tachycardia, tachypnea, worsening renal function with cre 4.00, lactate 0.92. Chest x-ray showed left lower lobe infiltration with small amount of effusion. Patient admitted to inpatient for further events and treatment.  EKG: Not done in ED, will get one.   Where does patient live?   At home    Can patient participate in ADLs?  Barely   Review of Systems:   General: has fevers, chills,  has poor appetite, has fatigue HEENT: no blurry vision, hearing changes or sore throat Pulm: has dyspnea, coughing, no wheezing CV: has chest pain, no palpitations Abd: no nausea, vomiting, abdominal pain, diarrhea, constipation GU: no dysuria, burning on urination,  increased urinary frequency, hematuria  Ext: has leg edema Neuro: no unilateral weakness, numbness, or tingling, no vision change or hearing loss Skin: has rashers over both lower legs MSK: No muscle spasm, no deformity, no limitation of range of movement in spin Heme: No easy bruising.  Travel history: No recent long distant travel.  Allergy:  Allergies  Allergen Reactions  . Ace Inhibitors Other (See Comments)    Kidney failure  . Niaspan [Niacin Er] Other (See Comments)    flushing  . Other Rash    Pt started Norvasc, clonidine, and isosorbide on 04-26-15.  Not sure which one is causing the rash.  Pls update after pt goes to MD today    Past Medical History  Diagnosis Date  . Hypertension   . COPD (chronic obstructive pulmonary disease) ([place])   . Neuromuscular disorder ([place])   . Depression with anxiety   . Osteopenia   . Arthritis   . GERD (gastroesophageal reflux disease)   . Hyperlipidemia   . Obesity   . Vitamin B12 deficiency   . Chronic kidney disease (CKD), stage IV (severe) ([place])   . Sleep apnea     no cpap use  . Anemia     iron infusion- x2 recent , last 5'16  . Hx of adenomatous colonic polyps 09/2014  . Diabetes mellitus without complication ([place])     type ll   dx about 30 yrs ago.  [name] HNP (herniated nucleus pulposus with myelopathy), thoracic   . Diastolic CHF [facility])     Past Surgical History  Procedure Laterality Date  . Breast surgery  April 1996    needle biopsy   . Tubal ligation    . Cataract extraction, bilateral Bilateral   .  Esophagogastroduodenoscopy N/A 10/19/2014    Procedure: ESOPHAGOGASTRODUODENOSCOPY (EGD);  Surgeon: Gatha Mayer, MD;  Location: [facility];  Service: Endoscopy;  Laterality: N/A;  . Colonoscopy N/A 10/19/2014    Procedure: COLONOSCOPY;  Surgeon: Gatha Mayer, MD;  Location: [facility];  Service: Endoscopy;  Laterality: N/A;  . Ercp N/A 04/22/2015    Procedure: ENDOSCOPIC RETROGRADE CHOLANGIOPANCREATOGRAPHY (ERCP);   Surgeon: Milus Banister, MD;  Location: [facility];  Service: Endoscopy;  Laterality: N/A;  . Laparoscopic cholecystectomy  05/25/2015  . Cholecystectomy N/A 05/25/2015    Procedure: LAPAROSCOPIC CHOLECYSTECTOMY;  Surgeon: Coralie Keens, MD;  Location: [facility];  Service: General;  Laterality: N/A;    Social History:  reports that she has quit smoking. Her smoking use included Cigarettes. She has a 120 pack-year smoking history. She has never used smokeless tobacco. She reports that she does not drink alcohol or use illicit drugs.  Family History:  Family History  Problem Relation Age of Onset  . Hypertension Mother   . Hypertension Father   . Diabetes Father   . COPD Father   . Heart disease Father   . Diabetes Sister     x 2  . Heart attack Sister     stent  . Heart disease Sister   . Colon cancer Neg Hx   . Colon polyps Neg Hx   . Esophageal cancer Neg Hx   . Skin cancer Sister   . Testicular cancer Brother      Prior to Admission medications   Medication Sig Start Date End Date Taking? Authorizing Provider  acetaminophen (TYLENOL) 650 MG CR tablet Take 1,300 mg by mouth at bedtime.   Yes Historical Provider, MD  amLODipine (NORVASC) 10 MG tablet Take 1 tablet (10 mg total) by mouth daily. 04/25/15  Yes Thurnell Lose, MD  aspirin 81 MG tablet Take 81 mg by mouth daily.     Yes Historical Provider, MD  budesonide-formoterol (SYMBICORT) 80-4.5 MCG/ACT inhaler Inhale 1 puff into the lungs 2 (two) times daily. 11/26/14  Yes Mary-Margaret Hassell Done, FNP  carvedilol (COREG) 25 MG tablet TAKE 1 TABLET BY MOUTH TWICE DAILY WITH A MEAL 12/10/14  Yes Mary-Margaret Hassell Done, FNP  citalopram (CELEXA) 20 MG tablet TAKE 1 TABLET BY MOUTH EVERY DAY 12/10/14  Yes Mary-Margaret Hassell Done, FNP  cloNIDine (CATAPRES) 0.2 MG tablet Take 1 tablet (0.2 mg total) by mouth 3 (three) times daily. 04/25/15  Yes Thurnell Lose, MD  febuxostat (ULORIC) 40 MG tablet Take 1 tablet (40 mg total) by mouth daily.  11/26/14  Yes Mary-Margaret Hassell Done, FNP  fenofibrate (TRICOR) 145 MG tablet Take 1 tablet (145 mg total) by mouth daily. 11/26/14  Yes Mary-Margaret Hassell Done, FNP  Ferrous Sulfate (IRON) 325 (65 FE) MG TABS Take 1 tablet by mouth daily. Patient taking differently: Take 1 tablet by mouth 2 (two) times daily.  03/08/15  Yes Chipper Herb, MD  furosemide (LASIX) 40 MG tablet Take 1 tablet (40 mg total) by mouth daily. 04/25/15  Yes Thurnell Lose, MD  gabapentin (NEURONTIN) 300 MG capsule TAKE 1 CAPSULE BY MOUTH FOUR TIMES A DAY 03/26/15  Yes Mary-Margaret Hassell Done, FNP  insulin aspart protamine- aspart (NOVOLOG MIX 70/30) (70-30) 100 UNIT/ML injection Inject 55-65 Units into the skin 2 (two) times daily with a meal. 55 units in the morning and 65 units in the evening   Yes Historical Provider, MD  isosorbide mononitrate (IMDUR) 30 MG 24 hr tablet Take 1 tablet (30 mg total) by mouth  daily. 04/25/15  Yes Thurnell Lose, MD  nystatin cream (MYCOSTATIN) APPLY TOPICALLY TWO TIMES DAILY. 03/26/15  Yes Mary-Margaret Hassell Done, FNP  Omega-3 Fatty Acids (FISH OIL) 1000 MG CAPS Take 1 capsule by mouth daily.   Yes Historical Provider, MD  omeprazole (PRILOSEC) 40 MG capsule TAKE 1 CAPSULE BY MOUTH EVERY DAY 11/26/14  Yes Mary-Margaret Hassell Done, FNP  oxyCODONE (OXY IR/ROXICODONE) 5 MG immediate release tablet Take 1-2 tablets (5-10 mg total) by mouth every 4 (four) hours as needed for moderate pain. 05/26/15  Yes Coralie Keens, MD  oxyCODONE-acetaminophen (PERCOCET) 10-325 MG tablet Take 1 tablet by mouth 2 (two) times daily. 04/30/15  Yes Mary-Margaret Hassell Done, FNP  polyethylene glycol (MIRALAX / GLYCOLAX) packet Take 17 g by mouth daily. And an additional packet if needed   Yes Historical Provider, MD  rosuvastatin (CRESTOR) 20 MG tablet TAKE 1 TABLET BY MOUTH EVERY DAY 04/30/15  Yes Mary-Margaret Hassell Done, FNP  ULORIC 40 MG tablet TAKE 1 TABLET BY MOUTH ONCE DAILY 12/10/14  Yes Mary-Margaret Hassell Done, FNP  Alcohol Swabs  (PHARMACIST CHOICE ALCOHOL) PADS USE 2 TIMES DAILY FOR DIABETIC TESTING 12/09/14   Chipper Herb, MD  B-D UF III MINI PEN NEEDLES 31G X 5 MM MISC USE TWICE A DAY TO INJECT NOVOLOG MIX 70/30 INSULIN 03/11/13   Mary-Margaret Hassell Done, FNP  EASY TOUCH INSULIN SYRINGE 31G X 5/16" 1 ML MISC USE TO INJECT INSULIN TWICE DAILY 12/10/12   Mary-Margaret Hassell Done, FNP  EASY TOUCH PEN NEEDLES 31G X 8 MM MISC USE TWICE DAILY 04/30/15   Mary-Margaret Hassell Done, FNP  glimepiride (AMARYL) 4 MG tablet TAKE 1/2 TABLET BY MOUTH EVERY DAY BEFORE BREAKFAST Patient not taking: Reported on 05/17/2015 04/02/15   Mary-Margaret Hassell Done, FNP  glucose blood (ACCU-CHEK AVIVA PLUS) test strip Test 4-5 x a day and prn dx E11.9 01/07/15   Mary-Margaret Hassell Done, FNP  glucose blood (ONETOUCH VERIO) test strip Test 4x a day and prn  E11.9 04/19/15   Mary-Margaret Hassell Done, FNP  glucose blood test strip Test 4x a day and prn  Dx E11.9 01/07/15   Mary-Margaret Hassell Done, FNP  Insulin Lispro Prot & Lispro (HUMALOG MIX 75/25 KWIKPEN) (75-25) 100 UNIT/ML Kwikpen 55 u in am and 65u in pm 04/15/15   Mary-Margaret Hassell Done, FNP  omeprazole (PRILOSEC) 40 MG capsule TAKE 1 CAPSULE BY MOUTH EVERY DAY Patient not taking: Reported on 05/17/2015 04/30/15   Mary-Margaret Hassell Done, FNP  PHARMACIST CHOICE LANCETS MISC USE 2 TIMES DAILY FOR DIABETIC TESTING 12/09/14   Chipper Herb, MD    Physical Exam: Filed Vitals:   05/26/15 2315 05/26/15 2330 05/26/15 2345 05/27/15 0000  BP: 127/52 85/66 134/58 146/94  Pulse: 80 80 82 82  Temp:      TempSrc:      Resp: 21 19 20 23   Height:      Weight:      SpO2: 95% 95% 93% 93%   General: Not in acute distress HEENT:       Eyes: PERRL, EOMI, no scleral icterus.       ENT: No discharge from the ears and nose, no pharynx injection, no tonsillar enlargement.        Neck: Difficult to assess JVD due to obesity, no bruit, no mass felt. Heme: No neck lymph node enlargement. Cardiac: S1/S2, RRR, No murmurs, No gallops or  rubs. Pulm: No rales, wheezing, rhonchi or rubs. Abd: Soft, nondistended, nontender, no rebound pain, no organomegaly, BS present. Ext: 1+ pitting leg edema bilaterally. 2+DP/PT pulse  bilaterally. Musculoskeletal: No joint deformities, No joint redness or warmth, no limitation of ROM in spin. Skin: No rashes.  Neuro: Alert, oriented X3, cranial nerves II-XII grossly intact, moves all extremities.  Psych: Patient is not psychotic, no suicidal or hemocidal ideation.  Labs on Admission:  Basic Metabolic Panel:  Recent Labs Lab 05/26/15 2254  NA 139  K 5.0  CL 106  CO2 19*  GLUCOSE 155*  BUN 72*  CREATININE 4.00*  CALCIUM 8.3*   Liver Function Tests:  Recent Labs Lab 05/26/15 2254  AST 30  ALT 12*  ALKPHOS 48  BILITOT 0.9  PROT 5.6*  ALBUMIN 2.2*   No results for input(s): LIPASE, AMYLASE in the last 168 hours. No results for input(s): AMMONIA in the last 168 hours. CBC:  Recent Labs Lab 05/26/15 2254  WBC 15.5*  NEUTROABS 14.2*  HGB 9.4*  HCT 30.0*  MCV 93.5  PLT 268   Cardiac Enzymes: No results for input(s): CKTOTAL, CKMB, CKMBINDEX, TROPONINI in the last 168 hours.  BNP (last 3 results)  Recent Labs  04/23/15 0008  BNP 298.3*    ProBNP (last 3 results) No results for input(s): PROBNP in the last 8760 hours.  CBG:  Recent Labs Lab 05/25/15 0830 05/25/15 1723 05/25/15 2107 05/26/15 0115 05/26/15 0734  GLUCAP 194* 279* 218* 222* 208*    Radiological Exams on Admission: Dg Chest 2 View  05/26/2015  CLINICAL DATA:  Fever and hypoxia. EXAM: CHEST  2 VIEW COMPARISON:  04/23/2015 FINDINGS: The cardiac silhouette is enlarged. Mediastinal contours appear intact. Torturous knee and atherosclerotic disease of the aorta are seen. There is no evidence of pneumothorax. Low lung volumes. Mild pulmonary vascular congestion. There is a patchy left lower lobe airspace consolidation. There is a probable small left pleural effusion. Osseous structures are  without acute abnormality. Soft tissues are grossly normal. IMPRESSION: Low lung volumes with patchy left lower lobe airspace consolidation and small left pleural effusion, which may represent developing pneumonia. Mild pulmonary vascular congestion. Electronically Signed   By: Fidela Salisbury M.D.   On: 05/26/2015 22:52    Assessment/Plan Principal Problem:   HCAP (healthcare-associated pneumonia) Active Problems:   Diabetes mellitus, insulin dependent (IDDM), uncontrolled (HCC)   Hypertension   Hyperlipidemia   COPD (chronic obstructive pulmonary disease) (HCC)   Diabetic neuropathy (HCC)   GERD (gastroesophageal reflux disease)   Chronic pain syndrome   Anemia, iron deficiency   Acute renal failure superimposed on stage 4 chronic kidney disease (HCC)   Symptomatic cholelithiasis   Sepsis (HCC)   Rash   Depression with anxiety   Gout   Diastolic CHF ([place])   Sepsis due to HCAP: Patient's fever, cough, shortness of breath and chest x-ray findings are consistent with HCAP. She is septic with leukocytosis, tachycardia, tachypnea, fever. She is hemodynamically stable. Lactic acid is normal. She had one Bp measurements with low blood pressure of 85/66 in ED, but per nurse, that measurement was done when pt in a position compressing her arm.   - Will admit to Telemetry Bed - IV Vancomycin and Zosyn - Mucinex for cough  - Albuterol Neb prn for SOB - Urine legionella and S. pneumococcal antigen - Follow up blood culture x2, sputum culture, plus Flu pcr - will get Procalcitonin and trend lactic acid level per sepsis protocol - IVF: 2L of NS bolus in ED, followed by 75 mL per hour of NS (patient has a diastolic congestive heart failure, limiting aggressive IV fluids treatment) - trop  x 3 due to chest pain  COPD: no wheezing or rhonchi on auscultation, not acutely exacerbated -Continue Symbicort inhaler -When necessary albuterol nebulizer  Diastolic cardiac heart failure: Cardiac on  04/21/15 showed a year 55-CT percent with grade 2 diastolic dysfunction. Patient is on Lasix 40 mg at home. She has mild fluid overload with 1+ leg edema. Due to sepsis and worsening renal function, will hold diuretics tonight. -will hold lasix tonight -Continue aspirin and Coreg -check BNP  DM-II: Last A1c 6.3 on 04/20/15, well controled. Patient is taking mixing insulin 70/30 and Amaryl at home -will continue home 70/30 easily, 55 units in the morning and 65 units in the evening -SSI  HTN: -Hold amlodipine, Lasix and decreased clonidine dose from 0.2 mg 3 times per day to 0.1 mg twice per day, since patient is at risk of developing hypotension due to sepsis -IV hydralazine when necessary -Continue Coreg  HLD: Last LDL was 27 on 02/26/15 -Continue home medications: Crestor  GERD: -Protonix  Anemia, iron deficiency: Hemoglobin dropped slightly from 10.6 on 05/18/15-9.4, likely due to recent surgery. -Continue iron supplement -Follow-up the CBC  Acute on CKD-IV: Baseline creatinine 1.6~2.2. Her creatinine is 4.00 and BUN 72 on admission. Likely due to prerenal failure or dehydration and continuation of diuretics -Check FeUrea and Renal US -follow up renal function by BMP -IV fluids as above  Symptomatic cholelithiasis: s/p of laparoscopic cholecystectomy. No acute issues. Doing well.  -observe closely. -prn percocet for pain  Depression and anxiety: Stable, no suicidal or homicidal ideations. -Continue home medications: Celexa  Rash in legs: An clear etiology. Not tender, not itchy. Patient missed an appointment with dermatologist due to recent surgery -Follow-up with dermatologist  Gout: stable -continue home uloric   DVT ppx: SQ Heparin  (pt has worsening CDK-IV, sq heparin is better choice) Code Status: DNR Family Communication:  Yes, patient's daughters at bed side Disposition Plan: Admit to inpatient   Date of Service 05/27/2015    Ivor Costa [facility] Pager [phone]  If 7PM-7AM, please contact night-coverage [email or web address] Password [email or web address] 05/27/2015, 1:52 AM

## 2015-05-27 NOTE — ED Notes (Signed)
Anna [name], the pts daughter called & was updated on the pts status & plan of care

## 2015-05-27 NOTE — Progress Notes (Signed)
ANTIBIOTIC CONSULT NOTE - INITIAL  Pharmacy Consult for Vancomycin and Zosyn Indication: pneumonia  Allergies  Allergen Reactions  . Ace Inhibitors Other (See Comments)    Kidney failure  . Niaspan [Niacin Er] Other (See Comments)    flushing  . Other Rash    Pt started Norvasc, clonidine, and isosorbide on 04-26-15.  Not sure which one is causing the rash.  Pls update after pt goes to MD today    Patient Measurements: Height: 5\' 4"  (162.6 cm) Weight: 226 lb (102.513 kg) IBW/kg (Calculated) : 54.7  Vital Signs: Temp: 101.5 F (38.6 C) (01/25 2218) Temp Source: Rectal (01/25 2218) BP: 146/94 mmHg (01/26 0000) Pulse Rate: 82 (01/26 0000) Labs:  Recent Labs  05/26/15 2254  WBC 15.5*  HGB 9.4*  PLT 268  CREATININE 4.00*   Estimated Creatinine Clearance: 13.5 mL/min (by C-G formula based on Cr of 4). No results for input(s): VANCOTROUGH, VANCOPEAK, VANCORANDOM, GENTTROUGH, GENTPEAK, GENTRANDOM, TOBRATROUGH, TOBRAPEAK, TOBRARND, AMIKACINPEAK, AMIKACINTROU, AMIKACIN in the last 72 hours.   Microbiology: No results found for this or any previous visit (from the past 720 hour(s)).  Medical History: Past Medical History  Diagnosis Date  . Hypertension   . COPD (chronic obstructive pulmonary disease) ([place])   . Neuromuscular disorder ([place])   . Depression with anxiety   . Osteopenia   . Arthritis   . GERD (gastroesophageal reflux disease)   . Hyperlipidemia   . Obesity   . Vitamin B12 deficiency   . Chronic kidney disease (CKD), stage IV (severe) ([place])   . Sleep apnea     no cpap use  . Anemia     iron infusion- x2 recent , last 5'16  . Hx of adenomatous colonic polyps 09/2014  . Diabetes mellitus without complication ([place])     type ll   dx about 30 yrs ago.  [name] HNP (herniated nucleus pulposus with myelopathy), thoracic   . Diastolic CHF [facility])     Assessment: 81 year old female with stage 4 CKD (no HD) discharged 1/24 following laparoscopic cholecystectomy returns  tonight with fever and SOB.  Pharmacy to begin Vancomycin and Zosyn for possible HCAP  First doses already at midnight  Goal of Therapy:  Vancomycin trough level 15-20 mcg/ml  Appropriate dosing  Plan:  Zosyn 2.25 grams iv Q 8 hours starting at 8 am Vancomycin 1500 mg iv Q 48 hours Follow up cultures, progress, fever trend  Thank you Anette Guarneri, PharmD [phone]  05/27/2015,12:52 AM

## 2015-05-27 NOTE — ED Notes (Signed)
Pt placed on hospital bed

## 2015-05-27 NOTE — Addendum Note (Signed)
Addendum  created 05/27/15 0850 by Josephine Igo, CRNA   Modules edited: Anesthesia Events

## 2015-05-27 NOTE — ED Notes (Signed)
Breakfast tray ordered, heart healthy and carb mod diet.

## 2015-05-27 NOTE — Progress Notes (Addendum)
Patient admitted after midnight, please see H&P.   -AKI- will get renal eval-- hold Neurontin for now as Cr elevated -? Vasculitic rash- b/l LE and hands/arms- not itching -PNA- IV abx, await blood cultures Spoke w/ daughters at bedside -recent cholecystectomy Will place in SDU for closer monitoring of respiratory status  Anna Bear DO

## 2015-05-27 NOTE — ED Notes (Signed)
Unable to obtain urine sample for urine test d/t pts bed pan spilling in the bed, will attempt to collect again

## 2015-05-27 NOTE — ED Notes (Addendum)
Lab needs more urine when able due to urea nitrogen being send out and multiple test ordered on urine sent.

## 2015-05-27 NOTE — ED Notes (Signed)
Pt Daughter Rip Harbour wants to be called in am when bed is assigned. [phone] home and [phone] cell, may leave msg.

## 2015-05-27 NOTE — ED Provider Notes (Signed)
CSN: [id number]     Arrival date & time 05/26/15  2158 History   First MD Initiated Contact with Patient 05/26/15 2200     Chief Complaint  Patient presents with  . Fever  . Post-op Problem     (Consider location/radiation/quality/duration/timing/severity/associated sxs/prior Treatment) HPI   49 y f who was discharged earlier today from hospitalization for lap cholecystectomy who developed cough/sob after discharge as well as mild AMS per family.  Patient is brought back in with EMS who noted patient was hypoxic in the 80's on room air and she was placed on nasal cannula  Past Medical History  Diagnosis Date  . Hypertension   . COPD (chronic obstructive pulmonary disease) ([place])   . Neuromuscular disorder ([place])   . Depression with anxiety   . Osteopenia   . Arthritis   . GERD (gastroesophageal reflux disease)   . Hyperlipidemia   . Obesity   . Vitamin B12 deficiency   . Chronic kidney disease (CKD), stage IV (severe) ([place])   . Sleep apnea     no cpap use  . Anemia     iron infusion- x2 recent , last 5'16  . Hx of adenomatous colonic polyps 09/2014  . Diabetes mellitus without complication ([place])     type ll   dx about 30 yrs ago.  [name] HNP (herniated nucleus pulposus with myelopathy), thoracic   . Diastolic CHF [facility])    Past Surgical History  Procedure Laterality Date  . Breast surgery  April 1996    needle biopsy   . Tubal ligation    . Cataract extraction, bilateral Bilateral   . Esophagogastroduodenoscopy N/A 10/19/2014    Procedure: ESOPHAGOGASTRODUODENOSCOPY (EGD);  Surgeon: Gatha Mayer, MD;  Location: [facility];  Service: Endoscopy;  Laterality: N/A;  . Colonoscopy N/A 10/19/2014    Procedure: COLONOSCOPY;  Surgeon: Gatha Mayer, MD;  Location: [facility];  Service: Endoscopy;  Laterality: N/A;  . Ercp N/A 04/22/2015    Procedure: ENDOSCOPIC RETROGRADE CHOLANGIOPANCREATOGRAPHY (ERCP);  Surgeon: Milus Banister, MD;  Location: [facility];  Service: Endoscopy;   Laterality: N/A;  . Laparoscopic cholecystectomy  05/25/2015  . Cholecystectomy N/A 05/25/2015    Procedure: LAPAROSCOPIC CHOLECYSTECTOMY;  Surgeon: Coralie Keens, MD;  Location: [facility] OR;  Service: General;  Laterality: N/A;   Family History  Problem Relation Age of Onset  . Hypertension Mother   . Hypertension Father   . Diabetes Father   . COPD Father   . Heart disease Father   . Diabetes Sister     x 2  . Heart attack Sister     stent  . Heart disease Sister   . Colon cancer Neg Hx   . Colon polyps Neg Hx   . Esophageal cancer Neg Hx   . Skin cancer Sister   . Testicular cancer Brother    Social History  Substance Use Topics  . Smoking status: Former Smoker -- 2.00 packs/day for 60 years    Types: Cigarettes  . Smokeless tobacco: Never Used     Comment: "quit smoking in ~ 2009"  . Alcohol Use: No   OB History    No data available     Review of Systems  Constitutional: Positive for fever. Negative for chills.  HENT: Negative for nosebleeds.   Eyes: Negative for visual disturbance.  Respiratory: Positive for cough and shortness of breath.   Cardiovascular: Negative for chest pain.  Gastrointestinal: Negative for nausea, vomiting, abdominal pain, diarrhea and constipation.  Genitourinary: Negative for dysuria.  Skin: Negative for rash.  Neurological: Negative for weakness.  All other systems reviewed and are negative.     Allergies  Ace inhibitors; Niaspan; and Other  Home Medications   Prior to Admission medications   Medication Sig Start Date End Date Taking? Authorizing Provider  acetaminophen (TYLENOL) 650 MG CR tablet Take 1,300 mg by mouth at bedtime.   Yes Historical Provider, MD  amLODipine (NORVASC) 10 MG tablet Take 1 tablet (10 mg total) by mouth daily. 04/25/15  Yes Thurnell Lose, MD  aspirin 81 MG tablet Take 81 mg by mouth daily.     Yes Historical Provider, MD  budesonide-formoterol (SYMBICORT) 80-4.5 MCG/ACT inhaler Inhale 1 puff into the  lungs 2 (two) times daily. 11/26/14  Yes Mary-Margaret Hassell Done, FNP  carvedilol (COREG) 25 MG tablet TAKE 1 TABLET BY MOUTH TWICE DAILY WITH A MEAL 12/10/14  Yes Mary-Margaret Hassell Done, FNP  citalopram (CELEXA) 20 MG tablet TAKE 1 TABLET BY MOUTH EVERY DAY 12/10/14  Yes Mary-Margaret Hassell Done, FNP  cloNIDine (CATAPRES) 0.2 MG tablet Take 1 tablet (0.2 mg total) by mouth 3 (three) times daily. 04/25/15  Yes Thurnell Lose, MD  febuxostat (ULORIC) 40 MG tablet Take 1 tablet (40 mg total) by mouth daily. 11/26/14  Yes Mary-Margaret Hassell Done, FNP  fenofibrate (TRICOR) 145 MG tablet Take 1 tablet (145 mg total) by mouth daily. 11/26/14  Yes Mary-Margaret Hassell Done, FNP  Ferrous Sulfate (IRON) 325 (65 FE) MG TABS Take 1 tablet by mouth daily. Patient taking differently: Take 1 tablet by mouth 2 (two) times daily.  03/08/15  Yes Chipper Herb, MD  furosemide (LASIX) 40 MG tablet Take 1 tablet (40 mg total) by mouth daily. 04/25/15  Yes Thurnell Lose, MD  gabapentin (NEURONTIN) 300 MG capsule TAKE 1 CAPSULE BY MOUTH FOUR TIMES A DAY 03/26/15  Yes Mary-Margaret Hassell Done, FNP  insulin aspart protamine- aspart (NOVOLOG MIX 70/30) (70-30) 100 UNIT/ML injection Inject 55-65 Units into the skin 2 (two) times daily with a meal. 55 units in the morning and 65 units in the evening   Yes Historical Provider, MD  isosorbide mononitrate (IMDUR) 30 MG 24 hr tablet Take 1 tablet (30 mg total) by mouth daily. 04/25/15  Yes Thurnell Lose, MD  nystatin cream (MYCOSTATIN) APPLY TOPICALLY TWO TIMES DAILY. 03/26/15  Yes Mary-Margaret Hassell Done, FNP  Omega-3 Fatty Acids (FISH OIL) 1000 MG CAPS Take 1 capsule by mouth daily.   Yes Historical Provider, MD  omeprazole (PRILOSEC) 40 MG capsule TAKE 1 CAPSULE BY MOUTH EVERY DAY 11/26/14  Yes Mary-Margaret Hassell Done, FNP  oxyCODONE (OXY IR/ROXICODONE) 5 MG immediate release tablet Take 1-2 tablets (5-10 mg total) by mouth every 4 (four) hours as needed for moderate pain. 05/26/15  Yes Coralie Keens,  MD  oxyCODONE-acetaminophen (PERCOCET) 10-325 MG tablet Take 1 tablet by mouth 2 (two) times daily. 04/30/15  Yes Mary-Margaret Hassell Done, FNP  polyethylene glycol (MIRALAX / GLYCOLAX) packet Take 17 g by mouth daily. And an additional packet if needed   Yes Historical Provider, MD  rosuvastatin (CRESTOR) 20 MG tablet TAKE 1 TABLET BY MOUTH EVERY DAY 04/30/15  Yes Mary-Margaret Hassell Done, FNP  ULORIC 40 MG tablet TAKE 1 TABLET BY MOUTH ONCE DAILY 12/10/14  Yes Mary-Margaret Hassell Done, FNP  Alcohol Swabs (PHARMACIST CHOICE ALCOHOL) PADS USE 2 TIMES DAILY FOR DIABETIC TESTING 12/09/14   Chipper Herb, MD  B-D UF III MINI PEN NEEDLES 31G X 5 MM MISC USE TWICE A DAY TO INJECT  NOVOLOG MIX 70/30 INSULIN 03/11/13   Mary-Margaret Hassell Done, FNP  EASY TOUCH INSULIN SYRINGE 31G X 5/16" 1 ML MISC USE TO INJECT INSULIN TWICE DAILY 12/10/12   Mary-Margaret Hassell Done, FNP  EASY TOUCH PEN NEEDLES 31G X 8 MM MISC USE TWICE DAILY 04/30/15   Mary-Margaret Hassell Done, FNP  glimepiride (AMARYL) 4 MG tablet TAKE 1/2 TABLET BY MOUTH EVERY DAY BEFORE BREAKFAST Patient not taking: Reported on 05/17/2015 04/02/15   Mary-Margaret Hassell Done, FNP  glucose blood (ACCU-CHEK AVIVA PLUS) test strip Test 4-5 x a day and prn dx E11.9 01/07/15   Mary-Margaret Hassell Done, FNP  glucose blood (ONETOUCH VERIO) test strip Test 4x a day and prn  E11.9 04/19/15   Mary-Margaret Hassell Done, FNP  glucose blood test strip Test 4x a day and prn  Dx E11.9 01/07/15   Mary-Margaret Hassell Done, FNP  Insulin Lispro Prot & Lispro (HUMALOG MIX 75/25 KWIKPEN) (75-25) 100 UNIT/ML Kwikpen 55 u in am and 65u in pm 04/15/15   Mary-Margaret Hassell Done, FNP  omeprazole (PRILOSEC) 40 MG capsule TAKE 1 CAPSULE BY MOUTH EVERY DAY Patient not taking: Reported on 05/17/2015 04/30/15   Mary-Margaret Hassell Done, FNP  PHARMACIST CHOICE LANCETS MISC USE 2 TIMES DAILY FOR DIABETIC TESTING 12/09/14   Chipper Herb, MD   BP 146/94 mmHg  Pulse 82  Temp(Src) 101.5 F (38.6 C) (Rectal)  Resp 23  Ht 5\' 4"  (1.626 m)   Wt 102.513 kg  BMI 38.77 kg/m2  SpO2 93% Physical Exam  Constitutional: She is oriented to person, place, and time. No distress.  HENT:  Head: Normocephalic and atraumatic.  Eyes: EOM are normal. Pupils are equal, round, and reactive to light.  Neck: Normal range of motion. Neck supple.  Cardiovascular: Intact distal pulses.   Pulmonary/Chest: No respiratory distress.  Abdominal: Soft. There is no tenderness.  Surgical incision sites appear c/d/i  Musculoskeletal: Normal range of motion.  Neurological: She is alert and oriented to person, place, and time.  Skin: No rash noted. She is not diaphoretic.  Psychiatric: She has a normal mood and affect.    ED Course  Procedures (including critical care time) Labs Review Labs Reviewed  COMPREHENSIVE METABOLIC PANEL - Abnormal; Notable for the following:    CO2 19 (*)    Glucose, Bld 155 (*)    BUN 72 (*)    Creatinine, Ser 4.00 (*)    Calcium 8.3 (*)    Total Protein 5.6 (*)    Albumin 2.2 (*)    ALT 12 (*)    GFR calc non Af Amer 10 (*)    GFR calc Af Amer 11 (*)    All other components within normal limits  CBC WITH DIFFERENTIAL/PLATELET - Abnormal; Notable for the following:    WBC 15.5 (*)    RBC 3.21 (*)    Hemoglobin 9.4 (*)    HCT 30.0 (*)    RDW 16.2 (*)    Neutro Abs 14.2 (*)    All other components within normal limits  CULTURE, BLOOD (ROUTINE X 2)  CULTURE, BLOOD (ROUTINE X 2)  URINE CULTURE  CULTURE, EXPECTORATED SPUTUM-ASSESSMENT  GRAM STAIN  URINALYSIS, ROUTINE W REFLEX MICROSCOPIC (NOT AT [facility])  BRAIN NATRIURETIC PEPTIDE  CREATININE, URINE, RANDOM  UREA NITROGEN, URINE  TROPONIN I  TROPONIN I  TROPONIN I  LACTIC ACID, PLASMA  LACTIC ACID, PLASMA  PROCALCITONIN  PROTIME-INR  APTT  INFLUENZA PANEL BY PCR (TYPE A & B, H1N1)  STREP PNEUMONIAE URINARY ANTIGEN  LEGIONELLA ANTIGEN, URINE  I-STAT  CG4 LACTIC ACID, ED  I-STAT CG4 LACTIC ACID, ED    Imaging Review Dg Chest 2 View  05/26/2015  CLINICAL  DATA:  Fever and hypoxia. EXAM: CHEST  2 VIEW COMPARISON:  04/23/2015 FINDINGS: The cardiac silhouette is enlarged. Mediastinal contours appear intact. Torturous knee and atherosclerotic disease of the aorta are seen. There is no evidence of pneumothorax. Low lung volumes. Mild pulmonary vascular congestion. There is a patchy left lower lobe airspace consolidation. There is a probable small left pleural effusion. Osseous structures are without acute abnormality. Soft tissues are grossly normal. IMPRESSION: Low lung volumes with patchy left lower lobe airspace consolidation and small left pleural effusion, which may represent developing pneumonia. Mild pulmonary vascular congestion. Electronically Signed   By: Fidela Salisbury M.D.   On: 05/26/2015 22:52   I have personally reviewed and evaluated these images and lab results as part of my medical decision-making.   EKG Interpretation None      MDM   Final diagnoses:  HCAP (healthcare-associated pneumonia)  Sepsis, due to unspecified organism [facility])    63 y f who was discharged earlier today from hospitalization for lap cholecystectomy who developed cough/sob after discharge as well as mild AMS per family.   On exam, pt is febrile and hypoxic with some tachypnea.    With cough/sob/fever to 101.5 and hypoxia.  Feel this is likely pna vs. PE in the post-op period. Her surgical incision sites appear c/d/i.    Will obtain cbc/cmp/lactic/blood cx.  Will cover with vanc/zosyn Have placed on [place]  cxr concerning for pna.  Labs with aki, normal lactate.  With sx of pna and infiltrate, feel no need to chase PE at this time as it is much less likely than HCAP Have spoken with Dr. Hulen Skains regarding pt so that surgery will be aware that pt is being re-admitted and will admit to medicine for further management of HCAP    Jarome Matin, MD 05/27/15 [id number]  Leo Grosser, MD 05/27/15 1530

## 2015-05-27 NOTE — Consult Note (Signed)
Anna [name] is an 81 y.o. female referred by Dr Eliseo Squires   Chief Complaint: ARF HPI: 81yo WF just Dc'd 1d ago following  Lap cholecystectomy.  Pt has CKD 4  Sec HTN/DMfollowed by Dr Lorrene Reid with baseline Scr around 2.  She has had a rash, that has come and gone over legs, arms and torso for last 2-3 weeks. Her Scr end of December was 1.79 and on 05/18/15 2.1.  Last night Scr up to 4.  Appetite has been poor for few weeks but says she has been taking in fluids ok.  Has developed mild cough sometimes productive of small amt of blood over last week or so.  Denies any change in UO or hematuria.  No new arthritic co.  US shows echogenicity in Rt kidney but unable to visualize lt kidney.  UA shows microhematuria which was not present 1 month ago. Hg down to 9.4 (Nl eosinophil ct) and WBC up to 15.5.  Plt ct NL.  CXR shows infiltrate LLL.  No new meds IE no antibiotics  Past Medical History  Diagnosis Date  . Hypertension   . COPD (chronic obstructive pulmonary disease) ([place])   . Neuromuscular disorder ([place])   . Depression with anxiety   . Osteopenia   . Arthritis   . GERD (gastroesophageal reflux disease)   . Hyperlipidemia   . Obesity   . Vitamin B12 deficiency   . Chronic kidney disease (CKD), stage IV (severe) ([place])   . Sleep apnea     no cpap use  . Anemia     iron infusion- x2 recent , last 5'16  . Hx of adenomatous colonic polyps 09/2014  . Diabetes mellitus without complication ([place])     type ll   dx about 30 yrs ago.  [name] HNP (herniated nucleus pulposus with myelopathy), thoracic   . Diastolic CHF [facility])     Past Surgical History  Procedure Laterality Date  . Breast surgery  April 1996    needle biopsy   . Tubal ligation    . Cataract extraction, bilateral Bilateral   . Esophagogastroduodenoscopy N/A 10/19/2014    Procedure: ESOPHAGOGASTRODUODENOSCOPY (EGD);  Surgeon: Gatha Mayer, MD;  Location: [facility];  Service: Endoscopy;  Laterality: N/A;  . Colonoscopy N/A 10/19/2014   Procedure: COLONOSCOPY;  Surgeon: Gatha Mayer, MD;  Location: [facility];  Service: Endoscopy;  Laterality: N/A;  . Ercp N/A 04/22/2015    Procedure: ENDOSCOPIC RETROGRADE CHOLANGIOPANCREATOGRAPHY (ERCP);  Surgeon: Milus Banister, MD;  Location: [facility];  Service: Endoscopy;  Laterality: N/A;  . Laparoscopic cholecystectomy  05/25/2015  . Cholecystectomy N/A 05/25/2015    Procedure: LAPAROSCOPIC CHOLECYSTECTOMY;  Surgeon: Coralie Keens, MD;  [facility] OR;  Service: General;  Laterality: N/A;    Family History  Problem Relation Age of Onset  . Hypertension Mother   . Hypertension Father   . Diabetes Father   . COPD Father   . Heart disease Father   . Diabetes Sister     x 2  . Heart attack Sister     stent  . Heart disease Sister   . Colon cancer Neg Hx   . Colon polyps Neg Hx   . Esophageal cancer Neg Hx   . Skin cancer Sister   . Testicular cancer Brother    Social History:  reports that she has quit smoking. Her smoking use included Cigarettes. She has a 120 pack-year smoking history. She has never used smokeless tobacco. She reports that she  does not drink alcohol or use illicit drugs.  Allergies:  Allergies  Allergen Reactions  . Ace Inhibitors Other (See Comments)    Kidney failure  . Niaspan [Niacin Er] Other (See Comments)    flushing  . Other Rash    Pt started Norvasc, clonidine, and isosorbide on 04-26-15.  Not sure which one is causing the rash.  Pls update after pt goes to MD today     (Not in a hospital admission)   Lab Results: UA: >300 protein 6-30 rbc 0-5 wbc   Recent Labs  05/26/15 2254  WBC 15.5*  HGB 9.4*  HCT 30.0*  PLT 268   BMET  Recent Labs  05/26/15 2254  NA 139  K 5.0  CL 106  CO2 19*  GLUCOSE 155*  BUN 72*  CREATININE 4.00*  CALCIUM 8.3*   LFT  Recent Labs  05/26/15 2254  PROT 5.6*  ALBUMIN 2.2*  AST 30  ALT 12*  ALKPHOS 48  BILITOT 0.9   Dg Chest 2 View  05/26/2015  CLINICAL DATA:  Fever and  hypoxia. EXAM: CHEST  2 VIEW COMPARISON:  04/23/2015 FINDINGS: The cardiac silhouette is enlarged. Mediastinal contours appear intact. Torturous knee and atherosclerotic disease of the aorta are seen. There is no evidence of pneumothorax. Low lung volumes. Mild pulmonary vascular congestion. There is a patchy left lower lobe airspace consolidation. There is a probable small left pleural effusion. Osseous structures are without acute abnormality. Soft tissues are grossly normal. IMPRESSION: Low lung volumes with patchy left lower lobe airspace consolidation and small left pleural effusion, which may represent developing pneumonia. Mild pulmonary vascular congestion. Electronically Signed   By: Fidela Salisbury M.D.   On: 05/26/2015 22:52   US Renal  05/27/2015  CLINICAL DATA:  Acute kidney injury. Bilateral renal atrophy seen on previous CT and ultrasound examinations. EXAM: RENAL / URINARY TRACT ULTRASOUND COMPLETE COMPARISON:  CT abdomen and pelvis 04/20/2015 FINDINGS: Right Kidney: Length: 12.6 cm. Mild diffuse parenchymal thinning. No solid mass or hydronephrosis. Left Kidney: Left kidney is not visualized due to overlying bowel gas. Unable to reposition patient for better visualization. Bladder: No bladder wall thickening or intraluminal filling defects. IMPRESSION: Mild diffuse parenchymal atrophy in the right kidney. Left kidney is not visualized due to bowel gas. Electronically Signed   By: Lucienne Capers M.D.   On: 05/27/2015 02:17    ROS: No change in vision No SOB No CP Minimal abd pain post surgery No change in edema No dysuria Neuropathic pain of feet  PHYSICAL EXAM: Blood pressure 129/46, pulse 77, temperature 99.2 F (37.3 C), temperature source Rectal, resp. rate 20, height 5\' 4"  (1.626 m), weight 102.513 kg (226 lb), SpO2 89 %. HEENT: PERRLA EOMI.  Conjunctiva mildly pale NECK:No JVD No bruits LUNGS:Decreased BS bil with few faint crackles Lt base CARDIAC:RRR wo MRG ABD:+ BS  NTND No HSM.  Healing surgical wounds [id number] edema.  Small. Discrete 2cmx2cm ulcer on posterior aspect of Rt calf with dark eschar NEURO:CNI Ox3 No asterixis Skin: Non-blanching macular rash over legs and arms  Assessment: 1. Acute on CKD 4.   Rash and hematuria +/- pulm infiltrate raises question of vasculitis.  Will check serologies 2. Anemia 3. SP Lap Chole 4. DM 5. HTN  PLAN: 1. Cont BP meds 2. Serologies ordered 3. Foley cath 4. Daily Scr 5. If Scr cont to rise then may need to consider empiric therapy for vasculitis 6. I discussed the role of HD if renal fx  cont to worsen and she would consider at least short term HD until dx made or renal fx recovers if there are expectations that it will get better.  Not sure she will opt for long term HD 7. Agree with holding neurontin for now 8. Renal diet 9. Check iron studies 10.  Cont po lasix but will increase dose   Maddyn Lieurance T 05/27/2015, 2:24 PM

## 2015-05-27 NOTE — ED Notes (Signed)
0345, pt repositioned and sats improved.

## 2015-05-27 NOTE — Consult Note (Signed)
WOC wound consult note Reason for Consult: right lower leg wound, family reports present for some time.  She has diffuse rash over the body that reported present since first of January 2017.  Unclear etiology, was to follow up with dermatology however she has not been able to due recent illness.  She was just DC after surgery and now readmitted with AMS and fever.  The rash does not itch or weep.  Some mild edema bilateral LE's, pulse weak, was able to doppler pulses bilaterally (LE).  Wound type: full thickness ulceration posterior right calf, 100% black.  Unclear etiology, some type of vasculitis  Pressure Ulcer POA: No Measurement: 1.0cm x 1.0cm x 0 Wound bed: 100% black with slight purple edges, not fluctuant Drainage (amount, consistency, odor) none Periwound: intact, but noted rash over the body.  Dressing procedure/placement/frequency: Due to weak pulses, I will not unroof this ulcer.  With unclear etiology as well feel that leaving the skin intact is best.  Will order topical antibiotic ointment only for now.   Discussed POC withbedside nurse.  Re consult if needed, will not follow at this time. Thanks  Dani Danis [place], [place] [phone])

## 2015-05-28 DIAGNOSIS — IMO0001 Reserved for inherently not codable concepts without codable children: Secondary | ICD-10-CM | POA: Diagnosis present

## 2015-05-28 DIAGNOSIS — R21 Rash and other nonspecific skin eruption: Secondary | ICD-10-CM | POA: Diagnosis present

## 2015-05-28 DIAGNOSIS — Z794 Long term (current) use of insulin: Secondary | ICD-10-CM

## 2015-05-28 DIAGNOSIS — I5032 Chronic diastolic (congestive) heart failure: Secondary | ICD-10-CM | POA: Diagnosis present

## 2015-05-28 DIAGNOSIS — E875 Hyperkalemia: Secondary | ICD-10-CM | POA: Diagnosis present

## 2015-05-28 DIAGNOSIS — F418 Other specified anxiety disorders: Secondary | ICD-10-CM

## 2015-05-28 DIAGNOSIS — E119 Type 2 diabetes mellitus without complications: Secondary | ICD-10-CM

## 2015-05-28 LAB — GLUCOSE, CAPILLARY
GLUCOSE-CAPILLARY: 146 mg/dL — AB (ref 65–99)
GLUCOSE-CAPILLARY: 175 mg/dL — AB (ref 65–99)
GLUCOSE-CAPILLARY: 175 mg/dL — AB (ref 65–99)
Glucose-Capillary: 230 mg/dL — ABNORMAL HIGH (ref 65–99)
Glucose-Capillary: 248 mg/dL — ABNORMAL HIGH (ref 65–99)

## 2015-05-28 LAB — CBC
HEMATOCRIT: 27.7 % — AB (ref 36.0–46.0)
HEMOGLOBIN: 8.3 g/dL — AB (ref 12.0–15.0)
MCH: 28.6 pg (ref 26.0–34.0)
MCHC: 30 g/dL (ref 30.0–36.0)
MCV: 95.5 fL (ref 78.0–100.0)
Platelets: 248 10*3/uL (ref 150–400)
RBC: 2.9 MIL/uL — AB (ref 3.87–5.11)
RDW: 16.6 % — ABNORMAL HIGH (ref 11.5–15.5)
WBC: 11.8 10*3/uL — ABNORMAL HIGH (ref 4.0–10.5)

## 2015-05-28 LAB — URINALYSIS, ROUTINE W REFLEX MICROSCOPIC
Bilirubin Urine: NEGATIVE
GLUCOSE, UA: NEGATIVE mg/dL
KETONES UR: NEGATIVE mg/dL
NITRITE: NEGATIVE
PROTEIN: 100 mg/dL — AB
Specific Gravity, Urine: 1.02 (ref 1.005–1.030)
pH: 5 (ref 5.0–8.0)

## 2015-05-28 LAB — LEGIONELLA ANTIGEN, URINE

## 2015-05-28 LAB — PROTEIN ELECTROPHORESIS, SERUM
A/G RATIO SPE: 0.6 — AB (ref 0.7–1.7)
ALPHA-2-GLOBULIN: 0.9 g/dL (ref 0.4–1.0)
Albumin ELP: 1.9 g/dL — ABNORMAL LOW (ref 2.9–4.4)
Alpha-1-Globulin: 0.5 g/dL — ABNORMAL HIGH (ref 0.0–0.4)
Beta Globulin: 0.8 g/dL (ref 0.7–1.3)
GLOBULIN, TOTAL: 3 g/dL (ref 2.2–3.9)
Gamma Globulin: 0.8 g/dL (ref 0.4–1.8)
Total Protein ELP: 4.9 g/dL — ABNORMAL LOW (ref 6.0–8.5)

## 2015-05-28 LAB — URINE MICROSCOPIC-ADD ON

## 2015-05-28 LAB — SODIUM, URINE, RANDOM: SODIUM UR: 41 mmol/L

## 2015-05-28 LAB — URINE CULTURE

## 2015-05-28 LAB — MPO/PR-3 (ANCA) ANTIBODIES: ANCA Proteinase 3: <3.5 U/mL (ref 0.0–3.5)

## 2015-05-28 LAB — C4 COMPLEMENT: COMPLEMENT C4, BODY FLUID: 29 mg/dL (ref 14–44)

## 2015-05-28 LAB — RENAL FUNCTION PANEL
ANION GAP: 8 (ref 5–15)
Albumin: 1.8 g/dL — ABNORMAL LOW (ref 3.5–5.0)
BUN: 76 mg/dL — ABNORMAL HIGH (ref 6–20)
CHLORIDE: 110 mmol/L (ref 101–111)
CO2: 20 mmol/L — AB (ref 22–32)
Calcium: 7.6 mg/dL — ABNORMAL LOW (ref 8.9–10.3)
Creatinine, Ser: 4.2 mg/dL — ABNORMAL HIGH (ref 0.44–1.00)
GFR calc non Af Amer: 9 mL/min — ABNORMAL LOW (ref >60)
GFR, EST AFRICAN AMERICAN: 11 mL/min — AB (ref >60)
Glucose, Bld: 137 mg/dL — ABNORMAL HIGH (ref 65–99)
POTASSIUM: 5.3 mmol/L — AB (ref 3.5–5.1)
Phosphorus: 5.2 mg/dL — ABNORMAL HIGH (ref 2.5–4.6)
Sodium: 138 mmol/L (ref 135–145)

## 2015-05-28 LAB — GLOMERULAR BASEMENT MEMBRANE ANTIBODIES: GBM AB: 3 U (ref 0–20)

## 2015-05-28 LAB — UREA NITROGEN, URINE: Urea Nitrogen, Ur: 439 mg/dL

## 2015-05-28 LAB — ANTINUCLEAR ANTIBODIES, IFA: ANA Ab, IFA: NEGATIVE

## 2015-05-28 LAB — C3 COMPLEMENT: C3 Complement: 146 mg/dL (ref 82–167)

## 2015-05-28 LAB — OCCULT BLOOD X 1 CARD TO LAB, STOOL: FECAL OCCULT BLD: POSITIVE — AB

## 2015-05-28 LAB — CREATININE, URINE, RANDOM: Creatinine, Urine: 85.42 mg/dL

## 2015-05-28 LAB — MAGNESIUM: Magnesium: 1.7 mg/dL (ref 1.7–2.4)

## 2015-05-28 MED ORDER — ARFORMOTEROL TARTRATE 15 MCG/2ML IN NEBU
15.0000 ug | INHALATION_SOLUTION | Freq: Two times a day (BID) | RESPIRATORY_TRACT | Status: DC
  Administered 2015-05-28 – 2015-06-04 (×14): 15 ug via RESPIRATORY_TRACT
  Filled 2015-05-28 (×16): qty 2

## 2015-05-28 MED ORDER — METHYLPREDNISOLONE SODIUM SUCC 125 MG IJ SOLR
60.0000 mg | INTRAMUSCULAR | Status: DC
  Administered 2015-05-28 – 2015-05-29 (×2): 60 mg via INTRAVENOUS
  Filled 2015-05-28 (×2): qty 2

## 2015-05-28 MED ORDER — PRAMOXINE-ZINC OXIDE IN MO 1-12.5 % RE OINT
TOPICAL_OINTMENT | Freq: Three times a day (TID) | RECTAL | Status: DC | PRN
  Filled 2015-05-28: qty 28.3

## 2015-05-28 MED ORDER — WITCH HAZEL-GLYCERIN EX PADS
MEDICATED_PAD | CUTANEOUS | Status: DC | PRN
  Administered 2015-05-28 – 2015-05-31 (×2): 1 via TOPICAL
  Filled 2015-05-28: qty 100

## 2015-05-28 MED ORDER — DM-GUAIFENESIN ER 30-600 MG PO TB12
1.0000 | ORAL_TABLET | Freq: Two times a day (BID) | ORAL | Status: DC
  Administered 2015-05-28 – 2015-06-04 (×15): 1 via ORAL
  Filled 2015-05-28 (×15): qty 1

## 2015-05-28 MED ORDER — IPRATROPIUM-ALBUTEROL 0.5-2.5 (3) MG/3ML IN SOLN
3.0000 mL | Freq: Four times a day (QID) | RESPIRATORY_TRACT | Status: DC
  Administered 2015-05-29 – 2015-05-31 (×9): 3 mL via RESPIRATORY_TRACT
  Filled 2015-05-28 (×10): qty 3

## 2015-05-28 MED ORDER — ALBUTEROL SULFATE (2.5 MG/3ML) 0.083% IN NEBU
2.5000 mg | INHALATION_SOLUTION | Freq: Four times a day (QID) | RESPIRATORY_TRACT | Status: DC | PRN
Start: 2015-05-28 — End: 2015-06-04

## 2015-05-28 MED ORDER — DIPHENHYDRAMINE HCL 25 MG PO CAPS: 25.0000 mg | ORAL_CAPSULE | Freq: Once | ORAL | Status: DC

## 2015-05-28 MED ORDER — SODIUM POLYSTYRENE SULFONATE 15 GM/60ML PO SUSP
30.0000 g | Freq: Once | ORAL | Status: AC
  Administered 2015-05-28: 30 g via ORAL
  Filled 2015-05-28: qty 120

## 2015-05-28 MED ORDER — IPRATROPIUM-ALBUTEROL 0.5-2.5 (3) MG/3ML IN SOLN
3.0000 mL | Freq: Four times a day (QID) | RESPIRATORY_TRACT | Status: DC
  Administered 2015-05-28 (×2): 3 mL via RESPIRATORY_TRACT
  Filled 2015-05-28 (×2): qty 3

## 2015-05-28 NOTE — Progress Notes (Signed)
Patient ID: Anna [name], female   DOB: 01/06/1935, 81 y.o.   MRN: [id number] S:pt somnolent this am, no new complaint2 O:BP 125/57 mmHg  Pulse 66  Temp(Src) 97.9 F (36.6 C) (Axillary)  Resp 19  Ht 5\' 4"  (1.626 m)  Wt 109.9 kg (242 lb 4.6 oz)  BMI 41.57 kg/m2  SpO2 92%  Intake/Output Summary (Last 24 hours) at 05/28/15 0747 Last data filed at 05/28/15 0439  Gross per 24 hour  Intake     50 ml  Output    600 ml  Net   -550 ml   Intake/Output: I/O last 3 completed shifts: In: 2050 [I.V.:2000; IV Piggyback:50] Out: 610 [Urine:610]  Intake/Output this shift:    Weight change: 7.387 kg (16 lb 4.6 oz) [id number], obese, elderly WF wearing venti mask, fatigued CVS:no rub Resp:occ rhonchi [id number] Ext:+ edema and diffuse petechial rash on hand, abdomen, legs (fading on thighs)    Recent Labs Lab 05/26/15 2254 05/27/15 1612 05/28/15 0216  NA 139 140 138  K 5.0 5.0 5.3*  CL 106 111 110  CO2 19* 19* 20*  GLUCOSE 155* 71 137*  BUN 72* 71* 76*  CREATININE 4.00* 3.87* 4.20*  ALBUMIN 2.2*  --  1.8*  CALCIUM 8.3* 7.7* 7.6*  PHOS  --   --  5.2*  AST 30  --   --   ALT 12*  --   --    Liver Function Tests:  Recent Labs Lab 05/26/15 2254 05/28/15 0216  AST 30  --   ALT 12*  --   ALKPHOS 48  --   BILITOT 0.9  --   PROT 5.6*  --   ALBUMIN 2.2* 1.8*   No results for input(s): LIPASE, AMYLASE in the last 168 hours. No results for input(s): AMMONIA in the last 168 hours. CBC:  Recent Labs Lab 05/26/15 2254 05/28/15 0216  WBC 15.5* 11.8*  NEUTROABS 14.2*  --   HGB 9.4* 8.3*  HCT 30.0* 27.7*  MCV 93.5 95.5  PLT 268 248   Cardiac Enzymes:  Recent Labs Lab 05/27/15 0142 05/27/15 0601 05/27/15 1612  TROPONINI 0.06* 0.05* 0.05*   CBG:  Recent Labs Lab 05/27/15 1444 05/27/15 1521 05/27/15 1755 05/27/15 1823 05/27/15 2126  GLUCAP 63* 72 61* 67 119*    Iron Studies:  Recent Labs  05/27/15 1612  IRON 7*  TIBC 225*  FERRITIN 131    Studies/Results: Dg Chest 2 View  05/26/2015  CLINICAL DATA:  Fever and hypoxia. EXAM: CHEST  2 VIEW COMPARISON:  04/23/2015 FINDINGS: The cardiac silhouette is enlarged. Mediastinal contours appear intact. Torturous knee and atherosclerotic disease of the aorta are seen. There is no evidence of pneumothorax. Low lung volumes. Mild pulmonary vascular congestion. There is a patchy left lower lobe airspace consolidation. There is a probable small left pleural effusion. Osseous structures are without acute abnormality. Soft tissues are grossly normal. IMPRESSION: Low lung volumes with patchy left lower lobe airspace consolidation and small left pleural effusion, which may represent developing pneumonia. Mild pulmonary vascular congestion. Electronically Signed   By: Fidela Salisbury M.D.   On: 05/26/2015 22:52   US Renal  05/27/2015  CLINICAL DATA:  Acute kidney injury. Bilateral renal atrophy seen on previous CT and ultrasound examinations. EXAM: RENAL / URINARY TRACT ULTRASOUND COMPLETE COMPARISON:  CT abdomen and pelvis 04/20/2015 FINDINGS: Right Kidney: Length: 12.6 cm. Mild diffuse parenchymal thinning. No solid mass or hydronephrosis. Left Kidney: Left kidney is not visualized due to overlying bowel  gas. Unable to reposition patient for better visualization. Bladder: No bladder wall thickening or intraluminal filling defects. IMPRESSION: Mild diffuse parenchymal atrophy in the right kidney. Left kidney is not visualized due to bowel gas. Electronically Signed   By: Lucienne Capers M.D.   On: 05/27/2015 02:17   . amLODipine  10 mg Oral Daily  . aspirin  81 mg Oral Daily  . budesonide-formoterol  1 puff Inhalation BID  . carvedilol  25 mg Oral BID WC  . citalopram  20 mg Oral Daily  . cloNIDine  0.2 mg Oral BID  . febuxostat  40 mg Oral Daily  . fenofibrate  160 mg Oral Daily  . furosemide  80 mg Oral BID  . heparin  5,000 Units Subcutaneous 3 times per day  . insulin aspart  0-9 Units  Subcutaneous TID WC  . insulin aspart protamine- aspart  55 Units Subcutaneous Q breakfast  . insulin aspart protamine- aspart  65 Units Subcutaneous Q supper  . isosorbide mononitrate  30 mg Oral Daily  . neomycin-bacitracin-polymyxin   Topical Daily  . nystatin cream   Topical BID  . omega-3 acid ethyl esters  1 g Oral Daily  . oxyCODONE-acetaminophen  1 tablet Oral BID   And  . oxyCODONE  5 mg Oral BID  . pantoprazole  40 mg Oral Daily  . piperacillin-tazobactam (ZOSYN)  IV  2.25 g Intravenous Q8H  . rosuvastatin  10 mg Oral Daily  . vancomycin  1,500 mg Intravenous Q48H    BMET    Component Value Date/Time   NA 138 05/28/2015 0216   NA 141 02/26/2015 1049   K 5.3* 05/28/2015 0216   CL 110 05/28/2015 0216   CO2 20* 05/28/2015 0216   GLUCOSE 137* 05/28/2015 0216   GLUCOSE 228* 02/26/2015 1049   BUN 76* 05/28/2015 0216   BUN 50* 02/26/2015 1049   CREATININE 4.20* 05/28/2015 0216   CREATININE 2.11* 11/13/2012 1122   CALCIUM 7.6* 05/28/2015 0216   GFRNONAA 9* 05/28/2015 0216   GFRNONAA 22* 11/13/2012 1122   GFRAA 11* 05/28/2015 0216   GFRAA 26* 11/13/2012 1122   CBC    Component Value Date/Time   WBC 11.8* 05/28/2015 0216   WBC 6.3 05/14/2015 1524   WBC 5.5 11/26/2014 1045   RBC 2.90* 05/28/2015 0216   RBC 3.56* 05/14/2015 1524   RBC 3.80* 11/26/2014 1045   HGB 8.3* 05/28/2015 0216   HGB 12.0* 04/30/2015 1147   HCT 27.7* 05/28/2015 0216   HCT 32.4* 05/14/2015 1524   HCT 34.0* 11/26/2014 1045   PLT 248 05/28/2015 0216   PLT 252 05/14/2015 1524   MCV 95.5 05/28/2015 0216   MCV 91 05/14/2015 1524   MCV 89.5 11/26/2014 1045   MCH 28.6 05/28/2015 0216   MCH 28.7 05/14/2015 1524   MCH 29.1 11/26/2014 1045   MCHC 30.0 05/28/2015 0216   MCHC 31.5 05/14/2015 1524   MCHC 32.5 11/26/2014 1045   RDW 16.6* 05/28/2015 0216   RDW 16.7* 05/14/2015 1524   LYMPHSABS 0.8 05/26/2015 2254   LYMPHSABS 1.0 05/14/2015 1524   MONOABS 0.5 05/26/2015 2254   EOSABS 0.0 05/26/2015  2254   EOSABS 0.1 05/14/2015 1524   EOSABS 0.1 05/21/2014 1110   BASOSABS 0.0 05/26/2015 2254   BASOSABS 0.0 05/14/2015 1524     Assessment/Plan:  1. AKI/CKD stage 3 (baseline Scr 1.6-2 followed by Dr. Lorrene Reid) s/p lap chole who presents with fever, rash, recent onset of hematuria and proteinuria and ulcers on  lower extremities with a jump in Scr and concern for vasculitis 1. Consider pulse IV solumedrol until studies return if ok with primary team although concerned with active infection (HCAP) 2. Discussed with son that patient is a poor candidate for immunosuppressive agents if warranted in light of her acute infection and debilitated state, therefore would not pursue renal biopsy at this time but may require one early next week. 3. Possible HSP with presentation and rash 2. Hyperkalemia- due to #1- treat with kayexalate and follow 3. Metabolic acidosis- due to #1, may require bicarb 4. HCAP/LLL infiltrate with fevers and cough- currently on zosyn and vanc 5. ABLA s/p lap chole, follow and transfuse prn 6. Leg ulcers- wound care consulted 7. Rash- would benefit from biopsy  [name]

## 2015-05-28 NOTE — Progress Notes (Signed)
[facility] TEAM 1 - Stepdown/ICU TEAM Progress Note  Anna [name] [id number] DOB: 03/14/1935 DOA: 05/26/2015 PCP: Chevis Pretty, FNP  Admit HPI / Brief Narrative: 81 y.o. WF PMHx Depression, Anxiety, HTN, Chronic Diastolic CHF, HLD, DM Type 2 , COPD, GERD, Gout, symptomatic  Cholelithiasis S/P  Laparoscopic Cholecystectomy on 05/25/15, Obesity,  CKD stage IV, Anemia,   Who presents with fever, cough, shortness of breath.  Pt was hospitalized from 1/23 to 05/26/15 due to symptomatic cholelithiasis. She had laparoscopic cholecystectomy by dr. Ninfa Linden on 05/25/15. She was just discharged home in the morning. She reports that she started having dry cough, fever, chills, shortness of breath after she went home. She also has mild chest pain acrossing front the chest. She states that she has mild pain over surgical sites, but no abdominal pain, diarrhea, symptoms of UTI. Patient has rashes over both lower legs which started on 05/02/15. She does not have unilateral weakness, hematuria, hematochezia, nausea, vomiting.  In ED, patient was found to have WBC 15.5, temperature 101.5, tachycardia, tachypnea, worsening renal function with cre 4.00, lactate 0.92. Chest x-ray showed left lower lobe infiltration with small amount of effusion. Patient admitted to inpatient for further events and treatment.  HPI/Subjective:  1/27 A/O 4, states feels better than at admission. Does not know what her dry weight is, and does not weigh herself daily. States has not seen cardiologist. States believes the wound on the back of her right calf is secondary to when she tripped and hit herself with the walker.   Assessment/Plan: Sepsis due to HCAP:  -Patient's fever, cough, shortness of breath and chest x-ray findings are consistent with HCAP. She is septic with leukocytosis, tachycardia, tachypnea, fever. She is hemodynamically stable. Lactic acid is normal. She had one Bp measurements with low blood pressure of  85/66 in ED, but per nurse, that measurement was done when pt in a position compressing her arm.  - IV Vancomycin and Zosyn - Mucinex DM BID - DuoNeb QID -Flutter valve -Solumedrol 60 mg Daily - Urine strep pneumo urine antigen negative, legionella pending - Sputum pending; influenza negative -Hold hydration, patient appears fluid overloaded  COPD:  -Poor air movement mild wheezing  -Brovana BID -See sepsis  OSA? -Patient desats when she sleeps most likely has sleep apnea/obesity hypoventilation syndrome -CPAP at night  Chronic Diastolic cardiac heart failure:  -Patient with 2+ pedal edema bilateral -Lasix 80 mg BID -Strict in and out since admission +1.4 L -Daily weight admission weight= 102.5 kg       1/27 weight=?? -Coreg 25 mg BID -Clonidine 0.2 mg BID -Imdur 30 mg daily -Hydralazine PRN -BNP= 376.7 -Transfuse for hemoglobin<8  DM-II: Controlled - 04/20/2015 hemoglobin A1c = 6.3  -Home 70/30 easily, 55 units in the morning and 65 units in the evening -Sensitive SSI   HTN: -See chronic diastolic CHF  HLD:  -Last LDL was 27 on 02/26/15 -Continue home Crestor 10 mg daily  GERD: -Protonix  Anemia, iron deficiency:  -Hemoglobin continues to drop 9.4--> 8.3. True drop? For now will monitor - Occult blood -Continue iron supplement -Follow-up the CBC  Acute on CKD-IV: (Baseline Cr 1.6~2.2) - Her creatinine is 4.00 and BUN 72 on admission. Likely due to prerenal failure or dehydration and continuation of diuretics -Check FeUrea and Renal US -Nephrology consulted  Symptomatic cholelithiasis: s/p of laparoscopic cholecystectomy. No acute issues. Doing well.  -observe closely. -prn percocet for pain  Depression and anxiety:  -Stable, no suicidal or homicidal ideations. -Continue  Celexa 20 mg daily  Rash in legs/back:---> MCTD?  -Unclear etiology. Not tender, not itchy. Patient missed an appointment with dermatologist due to recent surgery -Follow-up with  dermatologist -Begin MCTD workup  Gout:  -stable -continue home uloric  Hyperkalemia -Kayexalate   Code Status: FULL Family Communication: no family present at time of exam Disposition Plan: Home vs SNF    Consultants: [facility]  Procedure/Significant Events: 12/21 echocardiogram;- Left ventricle: mild concentric hypertrophy. -LVEF= 55% to 60%. - (grade 2 diastolic dysfunction). - Left atrium: moderately dilated. - Pulmonary arteries: PA peak pressure: 31 mm Hg (S). 1/25 CXR;-patchy LLL airspace consolidation/small left pleural effusion, developing pneumonia.?   Culture 1/25 blood right hand/forearm NGTD 1/26 urine insignificant growth 1/26 strep pneumo urine antigen negative 1/26 influenza A/B/H1N1 negative 1/26 sputum pending 1/27 sputum pending   Antibiotics: Zosyn 1/26>> Vancomycin 1/25>>  DVT prophylaxis:    Devices    LINES / TUBES:      Continuous Infusions:   Objective: VITAL SIGNS: Temp: 97.6 F (36.4 C) (01/27 1933) Temp Source: Oral (01/27 1933) BP: 141/67 mmHg (01/27 1933) Pulse Rate: 67 (01/27 1933) SPO2; FIO2:   Intake/Output Summary (Last 24 hours) at 05/28/15 2011 Last data filed at 05/28/15 2000  Gross per 24 hour  Intake    820 ml  Output    850 ml  Net    -30 ml     Exam: General:  A/O 4, states feels better than at admission., No acute respiratory distress Eyes: Negative headache, double vision,negative scleral hemorrhage ENT: Negative Runny nose, negative gingival bleeding, Neck:  Negative scars, masses, torticollis, lymphadenopathy, JVD Lungs: diffuse poor air movement, mild expiratory wheezes, negative crackles Cardiovascular: Regular rate and rhythm without murmur gallop or rub normal S1 and S2 Abdomen: Morbidly obese, negative abdominal pain, nondistended, positive soft, bowel sounds, no rebound, no ascites, no appreciable mass Extremities: No significant cyanosis, clubbing. Bilateral  lower extremity pitting edema 2+ to thighs. RLE eschar calf (per patient secondary to striking her calf with a walker) negative discharge, negative erythema Psychiatric:  Negative depression, negative anxiety, negative fatigue, negative mania  Neurologic:  Cranial nerves II through XII intact, tongue/uvula midline, all extremities muscle strength 5/5, sensation intact throughout, negative dysarthria, negative expressive aphasia, negative receptive aphasia.   Data Reviewed: Basic Metabolic Panel:  Recent Labs Lab 05/26/15 2254 05/27/15 1612 05/28/15 0216 05/28/15 1225  NA 139 140 138  --   K 5.0 5.0 5.3*  --   CL 106 111 110  --   CO2 19* 19* 20*  --   GLUCOSE 155* 71 137*  --   BUN 72* 71* 76*  --   CREATININE 4.00* 3.87* 4.20*  --   CALCIUM 8.3* 7.7* 7.6*  --   MG  --   --   --  1.7  PHOS  --   --  5.2*  --    Liver Function Tests:  Recent Labs Lab 05/26/15 2254 05/28/15 0216  AST 30  --   ALT 12*  --   ALKPHOS 48  --   BILITOT 0.9  --   PROT 5.6*  --   ALBUMIN 2.2* 1.8*   No results for input(s): LIPASE, AMYLASE in the last 168 hours. No results for input(s): AMMONIA in the last 168 hours. CBC:  Recent Labs Lab 05/26/15 2254 05/28/15 0216  WBC 15.5* 11.8*  NEUTROABS 14.2*  --   HGB 9.4* 8.3*  HCT 30.0* 27.7*  MCV 93.5 95.5  PLT  268 248   Cardiac Enzymes:  Recent Labs Lab 05/27/15 0142 05/27/15 0601 05/27/15 1612  TROPONINI 0.06* 0.05* 0.05*   BNP (last 3 results)  Recent Labs  04/23/15 0008 05/27/15 0602  BNP 298.3* 376.7*    ProBNP (last 3 results) No results for input(s): PROBNP in the last 8760 hours.  CBG:  Recent Labs Lab 05/27/15 2126 05/28/15 0808 05/28/15 1227 05/28/15 1724 05/28/15 1932  GLUCAP 119* 146* 175* 230* 248*    Recent Results (from the past 240 hour(s))  Culture, blood (routine x 2)     Status: None (Preliminary result)   Collection Time: 05/26/15 10:52 PM  Result Value Ref Range Status   Specimen  Description BLOOD RIGHT HAND  Final   Special Requests BOTTLES DRAWN AEROBIC AND ANAEROBIC 5CC  Final   Culture NO GROWTH 2 DAYS  Final   Report Status PENDING  Incomplete  Culture, blood (routine x 2)     Status: None (Preliminary result)   Collection Time: 05/26/15 10:53 PM  Result Value Ref Range Status   Specimen Description BLOOD RIGHT FOREARM  Final   Special Requests BOTTLES DRAWN AEROBIC AND ANAEROBIC 5CC  Final   Culture NO GROWTH 2 DAYS  Final   Report Status PENDING  Incomplete  Urine culture     Status: None   Collection Time: 05/27/15  6:18 AM  Result Value Ref Range Status   Specimen Description URINE, CATHETERIZED  Final   Special Requests NONE  Final   Culture 2,000 COLONIES/mL INSIGNIFICANT GROWTH  Final   Report Status 05/28/2015 FINAL  Final     Studies:  Recent x-ray studies have been reviewed in detail by the Attending Physician  Scheduled Meds:  Scheduled Meds: . amLODipine  10 mg Oral Daily  . arformoterol  15 mcg Nebulization BID  . aspirin  81 mg Oral Daily  . carvedilol  25 mg Oral BID WC  . citalopram  20 mg Oral Daily  . cloNIDine  0.2 mg Oral BID  . dextromethorphan-guaiFENesin  1 tablet Oral BID  . febuxostat  40 mg Oral Daily  . fenofibrate  160 mg Oral Daily  . furosemide  80 mg Oral BID  . heparin  5,000 Units Subcutaneous 3 times per day  . insulin aspart  0-9 Units Subcutaneous TID WC  . insulin aspart protamine- aspart  55 Units Subcutaneous Q breakfast  . insulin aspart protamine- aspart  65 Units Subcutaneous Q supper  . ipratropium-albuterol  3 mL Nebulization Q6H  . isosorbide mononitrate  30 mg Oral Daily  . methylPREDNISolone (SOLU-MEDROL) injection  60 mg Intravenous Q24H  . neomycin-bacitracin-polymyxin   Topical Daily  . nystatin cream   Topical BID  . omega-3 acid ethyl esters  1 g Oral Daily  . oxyCODONE-acetaminophen  1 tablet Oral BID   And  . oxyCODONE  5 mg Oral BID  . pantoprazole  40 mg Oral Daily  .  piperacillin-tazobactam (ZOSYN)  IV  2.25 g Intravenous Q8H  . rosuvastatin  10 mg Oral Daily  . vancomycin  1,500 mg Intravenous Q48H    Time spent on care of this patient: 40 mins   Kolten Ryback, Geraldo Docker , MD  [facility] Office  [phone] Pager [phone]  On-Call/Text Page:      [email or web address]      password [email or web address]  If 7PM-7AM, please contact night-coverage [email or web address] Password [email or web address] 05/28/2015, 8:11 PM   LOS: 1 day   Care during the described time interval was  provided by me .  I have reviewed this patient's available data, including medical history, events of note, physical examination, and all test results as part of my evaluation. I have personally reviewed and interpreted all radiology studies.   Dia Crawford, MD [phone] Pager

## 2015-05-28 NOTE — Progress Notes (Addendum)
Inpatient Diabetes Program Recommendations  AACE/ADA: New Consensus Statement on Inpatient Glycemic Control (2015)  Target Ranges:  Prepandial:   less than 140 mg/dL      Peak postprandial:   less than 180 mg/dL (1-2 hours)      Critically ill patients:  140 - 180 mg/dL    Results for AMICA, LUDWIGSEN (MRN [id number]) as of 05/28/2015 09:07  Ref. Range 05/27/2015 09:21 05/27/2015 14:44 05/27/2015 15:21 05/27/2015 17:55 05/27/2015 18:23 05/27/2015 21:26  Glucose-Capillary Latest Ref Range: 65-99 mg/dL 158 (H) 63 (L) 72 61 (L) 67 119 (H)    Admit with: Pneumonia  History: DM, COPD, CKD, CHF  Home DM Meds: Humalog 75/25 insulin- 55 units with breakfast/ 65 units with supper  Current Insulin Orders: 70/30 insulin- 55 units with breakfast/ 65 units with supper      Novolog Sensitive SSI (0-9 units) TID AC      MD- Patient with Hypoglycemia yesterday during the afternoon after receiving 55 units 70/30 insulin in the morning.  Evening dose of 70/30 insulin was held last night.  Please consider reducing patient's 70/30 to half home doses for now.  70/30 insulin- 25 units with breakfast/ 30 units with supper      --Will follow patient during hospitalization--  Wyn Quaker RN, MSN, CDE Diabetes Coordinator Inpatient Glycemic Control Team Team Pager: [phone] (8a-5p)

## 2015-05-28 NOTE — Evaluation (Signed)
Physical Therapy Evaluation Patient Details Name: Anna [name] MRN: [id number] DOB: [birth year] Today's Date: 05/28/2015   History of Present Illness  Pt s/p cholecystectomy 05/25/15 with DC home and return same day due to SOB, fever with HCAP. PMHx: HTN, HLD, COPD, GERD, CKD, CHF, anxiety, depression, DM, gout  Clinical Impression  Pt pleasant and willing to mobilize but limited by weakness and fatigue. Pt with limited mobility at baseline with mostly home ambulation and reports at least one recent fall. Pt has supportive family but unclear if they can provide 24hr assist at D/C. Pt with sats 96% on 2L on arrival with desat to 84% with sitting EOB on RA with cues for breathing. Pt returned to 2L and maintained sats >90%. Pt with decreased strength, function, mobility, balance and cardiopulmonary tolerance who will benefit from acute therapy to maximize gait, independence and strength to decrease burden of care.     Follow Up Recommendations SNF;Supervision for mobility/OOB    Equipment Recommendations  3in1 (PT)    Recommendations for Other Services OT consult     Precautions / Restrictions Precautions Precautions: Fall Precaution Comments: watch sats      Mobility  Bed Mobility Overal bed mobility: Needs Assistance Bed Mobility: Supine to Sit     Supine to sit: Min assist;HOB elevated     General bed mobility comments: HOB 30 degrees with use of rail, cues for sequence and min assist to bring legs to EOB  Transfers Overall transfer level: Needs assistance   Transfers: Sit to/from Stand Sit to Stand: Min guard         General transfer comment: cues for hand placement and safety  Ambulation/Gait Ambulation/Gait assistance: Min guard Ambulation Distance (Feet): 25 Feet Assistive device: Rolling walker (2 wheeled) Gait Pattern/deviations: Step-through pattern;Decreased stride length;Trunk flexed;Wide base of support;Shuffle   Gait velocity interpretation: Below  normal speed for age/gender General Gait Details: cues for posture, position in RW and safety. Pt with tendency for forward flexion and self posterior to RW  Stairs            Wheelchair Mobility    Modified Rankin (Stroke Patients Only)       Balance Overall balance assessment: Needs assistance   Sitting balance-Leahy Scale: Good       Standing balance-Leahy Scale: Poor                               Pertinent Vitals/Pain Pain Assessment: No/denies pain    Home Living Family/patient expects to be discharged to:: Private residence Living Arrangements: Alone Available Help at Discharge: Family;Available 24 hours/day;Personal care attendant Type of Home: House Home Access: Stairs to enter   [employer] of Steps: 2 Home Layout: One level Home Equipment: Walker - 2 wheels;Electric scooter      Prior Function Level of Independence: Needs assistance   Gait / Transfers Assistance Needed: walks household distances with RW; uses "hoveround" if she wants to go outside; uses electric cart at grocery store (although children often shop for her)  ADL's / Homemaking Assistance Needed: aide 7 days a week for 2 hours for bathing and dressing        Hand Dominance        Extremity/Trunk Assessment   Upper Extremity Assessment: Generalized weakness           Lower Extremity Assessment: Generalized weakness      Cervical / Trunk Assessment: Kyphotic  Communication  Communication: No difficulties  Cognition Arousal/Alertness: Awake/alert Behavior During Therapy: WFL for tasks assessed/performed Overall Cognitive Status: Within Functional Limits for tasks assessed                      General Comments      Exercises        Assessment/Plan    PT Assessment Patient needs continued PT services  PT Diagnosis Difficulty walking;Generalized weakness   PT Problem List Decreased strength;Decreased activity tolerance;Decreased  balance;Decreased knowledge of use of DME;Decreased mobility;Cardiopulmonary status limiting activity  PT Treatment Interventions Gait training;Stair training;DME instruction;Functional mobility training;Therapeutic activities;Therapeutic exercise;Patient/family education   PT Goals (Current goals can be found in the Care Plan section) Acute Rehab PT Goals Patient Stated Goal: return home PT Goal Formulation: With patient/family Time For Goal Achievement: 06/11/15 Potential to Achieve Goals: Fair    Frequency Min 3X/week   Barriers to discharge Decreased caregiver support family unsure if they can provide 24hr care    Co-evaluation               End of Session Equipment Utilized During Treatment: Gait belt;Oxygen Activity Tolerance: Patient tolerated treatment well Patient left: in chair;with call bell/phone within reach;with family/visitor present Nurse Communication: Mobility status;Precautions         Time: [id number] PT Time Calculation (min) (ACUTE ONLY): 23 min   Charges:   PT Evaluation $PT Eval Moderate Complexity: 1 Procedure PT Treatments $Therapeutic Activity: 8-22 mins   PT G CodesMelford Aase 05/28/2015, 1:29 PM Elwyn Reach, [place]

## 2015-05-28 NOTE — Progress Notes (Signed)
OT Cancellation Note  Patient Details Name: Anna [name] MRN: [id number] DOB: Apr 24, 1935   Cancelled Treatment:    Reason Eval/Treat Not Completed: Medical issues which prohibited therapy (K+ 5.3. Will attempt OT eval at later time.)  Allah Reason A 05/28/2015, 7:59 AM

## 2015-05-28 NOTE — Plan of Care (Signed)
Problem: Education: Goal: Knowledge of [facility] Education information/materials will improve Outcome: Progressing Pt is aware of [facility]'s policy and procedures. Pt is able to accurately describe pain and state using pain scale

## 2015-05-28 NOTE — Progress Notes (Signed)
Utilization Review Completed.Sherlon Nied T1/27/2017  

## 2015-05-29 ENCOUNTER — Inpatient Hospital Stay (HOSPITAL_COMMUNITY): Payer: Medicare Other

## 2015-05-29 DIAGNOSIS — F32A Depression, unspecified: Secondary | ICD-10-CM | POA: Diagnosis present

## 2015-05-29 DIAGNOSIS — N189 Chronic kidney disease, unspecified: Secondary | ICD-10-CM

## 2015-05-29 DIAGNOSIS — M351 Other overlap syndromes: Secondary | ICD-10-CM | POA: Diagnosis present

## 2015-05-29 DIAGNOSIS — F329 Major depressive disorder, single episode, unspecified: Secondary | ICD-10-CM

## 2015-05-29 DIAGNOSIS — J189 Pneumonia, unspecified organism: Secondary | ICD-10-CM

## 2015-05-29 DIAGNOSIS — J449 Chronic obstructive pulmonary disease, unspecified: Secondary | ICD-10-CM | POA: Diagnosis present

## 2015-05-29 DIAGNOSIS — A419 Sepsis, unspecified organism: Secondary | ICD-10-CM | POA: Diagnosis present

## 2015-05-29 DIAGNOSIS — I5032 Chronic diastolic (congestive) heart failure: Secondary | ICD-10-CM | POA: Diagnosis present

## 2015-05-29 DIAGNOSIS — L959 Vasculitis limited to the skin, unspecified: Secondary | ICD-10-CM

## 2015-05-29 DIAGNOSIS — N179 Acute kidney failure, unspecified: Secondary | ICD-10-CM | POA: Diagnosis present

## 2015-05-29 DIAGNOSIS — F411 Generalized anxiety disorder: Secondary | ICD-10-CM

## 2015-05-29 LAB — RENAL FUNCTION PANEL
ANION GAP: 12 (ref 5–15)
Albumin: 1.9 g/dL — ABNORMAL LOW (ref 3.5–5.0)
BUN: 83 mg/dL — ABNORMAL HIGH (ref 6–20)
CALCIUM: 8 mg/dL — AB (ref 8.9–10.3)
CO2: 19 mmol/L — AB (ref 22–32)
Chloride: 108 mmol/L (ref 101–111)
Creatinine, Ser: 4.18 mg/dL — ABNORMAL HIGH (ref 0.44–1.00)
GFR calc Af Amer: 11 mL/min — ABNORMAL LOW (ref >60)
GFR calc non Af Amer: 9 mL/min — ABNORMAL LOW (ref >60)
GLUCOSE: 195 mg/dL — AB (ref 65–99)
POTASSIUM: 4.8 mmol/L (ref 3.5–5.1)
Phosphorus: 6.1 mg/dL — ABNORMAL HIGH (ref 2.5–4.6)
SODIUM: 139 mmol/L (ref 135–145)

## 2015-05-29 LAB — CBC
HCT: 29.2 % — ABNORMAL LOW (ref 36.0–46.0)
HEMOGLOBIN: 8.9 g/dL — AB (ref 12.0–15.0)
MCH: 28.3 pg (ref 26.0–34.0)
MCHC: 30.5 g/dL (ref 30.0–36.0)
MCV: 92.7 fL (ref 78.0–100.0)
Platelets: 300 10*3/uL (ref 150–400)
RBC: 3.15 MIL/uL — ABNORMAL LOW (ref 3.87–5.11)
RDW: 15.9 % — AB (ref 11.5–15.5)
WBC: 8.6 10*3/uL (ref 4.0–10.5)

## 2015-05-29 LAB — GLUCOSE, CAPILLARY
GLUCOSE-CAPILLARY: 162 mg/dL — AB (ref 65–99)
GLUCOSE-CAPILLARY: 198 mg/dL — AB (ref 65–99)
GLUCOSE-CAPILLARY: 176 mg/dL — AB (ref 65–99)
Glucose-Capillary: 167 mg/dL — ABNORMAL HIGH (ref 65–99)
Glucose-Capillary: 160 mg/dL — ABNORMAL HIGH (ref 65–99)
Glucose-Capillary: 242 mg/dL — ABNORMAL HIGH (ref 65–99)
Glucose-Capillary: 235 mg/dL — ABNORMAL HIGH (ref 65–99)

## 2015-05-29 LAB — RAPID HIV SCREEN (HIV 1/2 AB+AG)
HIV 1/2 Antibodies: NONREACTIVE
HIV-1 P24 Antigen - HIV24: NONREACTIVE

## 2015-05-29 MED ORDER — INSULIN ASPART 100 UNIT/ML ~~LOC~~ SOLN
0.0000 [IU] | SUBCUTANEOUS | Status: DC
  Administered 2015-05-29: 7 [IU] via SUBCUTANEOUS
  Administered 2015-05-30 (×2): 3 [IU] via SUBCUTANEOUS

## 2015-05-29 MED ORDER — DIPHENHYDRAMINE HCL 50 MG/ML IJ SOLN
12.5000 mg | Freq: Four times a day (QID) | INTRAMUSCULAR | Status: DC | PRN
  Administered 2015-05-29: 12.5 mg via INTRAVENOUS
  Filled 2015-05-29: qty 1

## 2015-05-29 NOTE — Progress Notes (Signed)
Patient ID: Anna [name], female   DOB: 1935/04/12, 81 y.o.   MRN: [id number] S:feeling better today O:BP 157/55 mmHg  Pulse 71  Temp(Src) 98.2 F (36.8 C) (Axillary)  Resp 16  Ht 5\' 4"  (1.626 m)  Wt 114.6 kg (252 lb 10.4 oz)  BMI 43.35 kg/m2  SpO2 95%  Intake/Output Summary (Last 24 hours) at 05/29/15 0830 Last data filed at 05/29/15 0300  Gross per 24 hour  Intake   1200 ml  Output   1100 ml  Net    100 ml   Intake/Output: I/O last 3 completed shifts: In: 1370 [P.O.:720; IV Piggyback:650] Out: [id number] [Urine:1450]  Intake/Output this shift:    Weight change: 4.7 kg (10 lb 5.8 oz) Gen:WD obese WF in NAD CVS:no rub Resp:bibasilar crackles [id number] Ext:+edema, diffuse petechial rash on lower extremities and hand/abdomen   Recent Labs Lab 05/26/15 2254 05/27/15 1612 05/28/15 0216 05/29/15 0224  NA 139 140 138 139  K 5.0 5.0 5.3* 4.8  CL 106 111 110 108  CO2 19* 19* 20* 19*  GLUCOSE 155* 71 137* 195*  BUN 72* 71* 76* 83*  CREATININE 4.00* 3.87* 4.20* 4.18*  ALBUMIN 2.2*  --  1.8* 1.9*  CALCIUM 8.3* 7.7* 7.6* 8.0*  PHOS  --   --  5.2* 6.1*  AST 30  --   --   --   ALT 12*  --   --   --    Liver Function Tests:  Recent Labs Lab 05/26/15 2254 05/28/15 0216 05/29/15 0224  AST 30  --   --   ALT 12*  --   --   ALKPHOS 48  --   --   BILITOT 0.9  --   --   PROT 5.6*  --   --   ALBUMIN 2.2* 1.8* 1.9*   No results for input(s): LIPASE, AMYLASE in the last 168 hours. No results for input(s): AMMONIA in the last 168 hours. CBC:  Recent Labs Lab 05/26/15 2254 05/28/15 0216 05/29/15 0224  WBC 15.5* 11.8* 8.6  NEUTROABS 14.2*  --   --   HGB 9.4* 8.3* 8.9*  HCT 30.0* 27.7* 29.2*  MCV 93.5 95.5 92.7  PLT 268 248 300   Cardiac Enzymes:  Recent Labs Lab 05/27/15 0142 05/27/15 0601 05/27/15 1612  TROPONINI 0.06* 0.05* 0.05*   CBG:  Recent Labs Lab 05/28/15 1932 05/28/15 2248 05/29/15 0247 05/29/15 0611 05/29/15 0730  GLUCAP 248* 175* 167*  160* 162*    Iron Studies:  Recent Labs  05/27/15 1612  IRON 7*  TIBC 225*  FERRITIN 131   Studies/Results: No results found. [name] amLODipine  10 mg Oral Daily  . arformoterol  15 mcg Nebulization BID  . aspirin  81 mg Oral Daily  . carvedilol  25 mg Oral BID WC  . citalopram  20 mg Oral Daily  . cloNIDine  0.2 mg Oral BID  . dextromethorphan-guaiFENesin  1 tablet Oral BID  . febuxostat  40 mg Oral Daily  . fenofibrate  160 mg Oral Daily  . furosemide  80 mg Oral BID  . heparin  5,000 Units Subcutaneous 3 times per day  . insulin aspart  0-9 Units Subcutaneous TID WC  . insulin aspart protamine- aspart  55 Units Subcutaneous Q breakfast  . insulin aspart protamine- aspart  65 Units Subcutaneous Q supper  . ipratropium-albuterol  3 mL Nebulization QID  . isosorbide mononitrate  30 mg Oral Daily  . methylPREDNISolone (SOLU-MEDROL) injection  60  mg Intravenous Q24H  . neomycin-bacitracin-polymyxin   Topical Daily  . nystatin cream   Topical BID  . omega-3 acid ethyl esters  1 g Oral Daily  . oxyCODONE-acetaminophen  1 tablet Oral BID   And  . oxyCODONE  5 mg Oral BID  . pantoprazole  40 mg Oral Daily  . piperacillin-tazobactam (ZOSYN)  IV  2.25 g Intravenous Q8H  . rosuvastatin  10 mg Oral Daily  . vancomycin  1,500 mg Intravenous Q48H    BMET    Component Value Date/Time   NA 139 05/29/2015 0224   NA 141 02/26/2015 1049   K 4.8 05/29/2015 0224   CL 108 05/29/2015 0224   CO2 19* 05/29/2015 0224   GLUCOSE 195* 05/29/2015 0224   GLUCOSE 228* 02/26/2015 1049   BUN 83* 05/29/2015 0224   BUN 50* 02/26/2015 1049   CREATININE 4.18* 05/29/2015 0224   CREATININE 2.11* 11/13/2012 1122   CALCIUM 8.0* 05/29/2015 0224   GFRNONAA 9* 05/29/2015 0224   GFRNONAA 22* 11/13/2012 1122   GFRAA 11* 05/29/2015 0224   GFRAA 26* 11/13/2012 1122   CBC    Component Value Date/Time   WBC 8.6 05/29/2015 0224   WBC 6.3 05/14/2015 1524   WBC 5.5 11/26/2014 1045   RBC 3.15* 05/29/2015  0224   RBC 3.56* 05/14/2015 1524   RBC 3.80* 11/26/2014 1045   HGB 8.9* 05/29/2015 0224   HGB 12.0* 04/30/2015 1147   HCT 29.2* 05/29/2015 0224   HCT 32.4* 05/14/2015 1524   HCT 34.0* 11/26/2014 1045   PLT 300 05/29/2015 0224   PLT 252 05/14/2015 1524   MCV 92.7 05/29/2015 0224   MCV 91 05/14/2015 1524   MCV 89.5 11/26/2014 1045   MCH 28.3 05/29/2015 0224   MCH 28.7 05/14/2015 1524   MCH 29.1 11/26/2014 1045   MCHC 30.5 05/29/2015 0224   MCHC 31.5 05/14/2015 1524   MCHC 32.5 11/26/2014 1045   RDW 15.9* 05/29/2015 0224   RDW 16.7* 05/14/2015 1524   LYMPHSABS 0.8 05/26/2015 2254   LYMPHSABS 1.0 05/14/2015 1524   MONOABS 0.5 05/26/2015 2254   EOSABS 0.0 05/26/2015 2254   EOSABS 0.1 05/14/2015 1524   EOSABS 0.1 05/21/2014 1110   BASOSABS 0.0 05/26/2015 2254   BASOSABS 0.0 05/14/2015 1524     Assessment/Plan:  1. AKI/CKD stage 3 (baseline Scr 1.6-2 followed by Dr. Lorrene Reid) s/p lap chole who presents with fever, rash, recent onset of hematuria and proteinuria and ulcers on lower extremities with a jump in Scr and concern for vasculitis 1. Negative ANA, ANCA, anti-GBM, SPEP, normal complements. 2. Consider pulse IV solumedrol until studies return if ok with primary team although concerned with active infection (HCAP) 3. Discussed with son that patient is a poor candidate for immunosuppressive agents if warranted in light of her acute infection and debilitated state, therefore would not pursue renal biopsy at this time but may require one early next week. 4. Possible HSP with presentation and rash 2. Hyperkalemia- due to #1- treat with kayexalate and follow 3. Metabolic acidosis- due to #1, may require bicarb 4. HCAP/LLL infiltrate with fevers and cough- currently on zosyn and vanc 5. ABLA s/p lap chole, follow and transfuse prn 6. Leg ulcers- wound care consulted 7. Rash- would benefit from biopsy 8. Disposition- pt is DNR  [name]

## 2015-05-29 NOTE — Progress Notes (Signed)
[facility] TEAM 1 - Stepdown/ICU TEAM Progress Note  Anna [name] FFM:[id number] DOB: October 11, 1934 DOA: 05/26/2015 PCP: Chevis Pretty, FNP  Admit HPI / Brief Narrative: 81 y.o. WF PMHx Depression, Anxiety, HTN, Chronic Diastolic CHF, HLD, DM Type 2 , COPD, GERD, Gout, symptomatic  Cholelithiasis S/P  Laparoscopic Cholecystectomy on 05/25/15, Obesity,  CKD stage IV, Anemia,   Who presents with fever, cough, shortness of breath.  Pt was hospitalized from 1/23 to 05/26/15 due to symptomatic cholelithiasis. She had laparoscopic cholecystectomy by dr. Ninfa Linden on 05/25/15. She was just discharged home in the morning. She reports that she started having dry cough, fever, chills, shortness of breath after she went home. She also has mild chest pain acrossing front the chest. She states that she has mild pain over surgical sites, but no abdominal pain, diarrhea, symptoms of UTI. Patient has rashes over both lower legs which started on 05/02/15. She does not have unilateral weakness, hematuria, hematochezia, nausea, vomiting.  In ED, patient was found to have WBC 15.5, temperature 101.5, tachycardia, tachypnea, worsening renal function with cre 4.00, lactate 0.92. Chest x-ray showed left lower lobe infiltration with small amount of effusion. Patient admitted to inpatient for further events and treatment.  HPI/Subjective:  1/28 A/O 4, states feels better than at admission. Sitting in chair comfortably, continued rash improved on some surfaces worse on others. Negative pruritic     Assessment/Plan: Sepsis due to HCAP:  -Patient's fever, cough, shortness of breath and chest x-ray findings are consistent with HCAP. She is septic with leukocytosis, tachycardia, tachypnea, fever. She is hemodynamically stable. Lactic acid is normal. She had one Bp measurements with low blood pressure of 85/66 in ED, but per nurse, that measurement was done when pt in a position compressing her arm.  - IV Vancomycin and  Zosyn - Mucinex DM BID - DuoNeb QID -Flutter valve -Solumedrol 60 mg Daily - Urine strep pneumo urine antigen negative, legionella pending - Sputum pending; influenza negative -Hold hydration, patient appears fluid overloaded  COPD:  -Poor air movement mild wheezing  -Brovana BID -See sepsis  OSA? -Patient desats when she sleeps most likely has sleep apnea/obesity hypoventilation syndrome -CPAP at night  Chronic Diastolic cardiac heart failure:  -Patient with 2+ pedal edema bilateral -Lasix 80 mg BID -Strict in and out since admission + 1.9 L -Daily weight admission weight= 102.5 kg       1/28 weight= 114.6 kg -Coreg 25 mg BID -Clonidine 0.2 mg BID -Imdur 30 mg daily -Hydralazine PRN -BNP= 376.7 -Transfuse for hemoglobin<8  DM-II: Controlled - 04/20/2015 hemoglobin A1c = 6.3  -Continue home 70/30 easily, 55 units in the morning and 65 units in the evening -Assisted SSI   HTN: -See chronic diastolic CHF  HLD:  -Last LDL was 27 on 02/26/15 -Continue home Crestor 10 mg daily  GERD: -Protonix  Anemia, iron deficiency:  -Hemoglobin stable - Occult blood -Continue iron supplement -Follow-up the CBC  Acute on CKD-IV: (Baseline Cr 1.6~2.2) - Her creatinine is 4.00 and BUN 72 on admission. Likely due to prerenal failure or dehydration and continuation of diuretics -Check FeUrea and Renal US -Nephrology consulted; also believes may be some sort of MCTD  Symptomatic cholelithiasis: s/p of laparoscopic cholecystectomy. No acute issues. Doing well.  -observe closely. -prn percocet for pain  Depression and anxiety:  -Stable, no suicidal or homicidal ideations. -Continue Celexa 20 mg daily  Rash in legs/back:---> MCTD/vasculitis of skin?  -Unclear etiology. Not tender, not itchy. Patient missed an appointment with  dermatologist due to recent surgery -Follow-up with dermatologist -Begin MCTD workup -Rash is now on palms pain as well as extremities. Maculopapular  in nature,.nonpruritic, nonblanching -Obtained additional labs, will consult surgery in a.m. for biopsy one of the skin lesions   Gout:  -stable -continue home uloric  Hyperkalemia - resolved   Code Status: FULL Family Communication:  family present at time of exam Disposition Plan: Home vs SNF    Consultants: Dr.[facility]  Procedure/Significant Events: 12/21 echocardiogram;- Left ventricle: mild concentric hypertrophy. -LVEF= 55% to 60%. - (grade 2 diastolic dysfunction). - Left atrium: moderately dilated. - Pulmonary arteries: PA peak pressure: 31 mm Hg (S). 1/25 CXR;-patchy LLL airspace consolidation/small left pleural effusion, developing pneumonia.?   Serologies -C3/C4 WNL -Myeloperoxidase antibody/P- ANKA WNL -ANA negative -GBM antibody negative -ESR high 97 -  Culture 1/25 blood right hand/forearm NGTD 1/26 urine insignificant growth 1/26 strep pneumo urine antigen negative 1/26 influenza A/B/H1N1 negative 1/26 sputum pending 1/27 sputum pending   Antibiotics: Zosyn 1/26>> Vancomycin 1/25>>  DVT prophylaxis:    Devices    LINES / TUBES:      Continuous Infusions:   Objective: VITAL SIGNS: Temp: 97.5 F (36.4 C) (01/28 1600) Temp Source: Oral (01/28 1600) BP: 150/65 mmHg (01/28 1600) Pulse Rate: 63 (01/28 1600) SPO2; FIO2:   Intake/Output Summary (Last 24 hours) at 05/29/15 1930 Last data filed at 05/29/15 1700  Gross per 24 hour  Intake   2040 ml  Output   1500 ml  Net    540 ml     Exam: General:  A/O 4, states feels better than at admission., No acute respiratory distress Eyes: Negative headache, double vision,negative scleral hemorrhage ENT: Negative Runny nose, negative gingival bleeding, Neck:  Negative scars, masses, torticollis, lymphadenopathy, JVD Lungs: diffuse poor air movement, mild expiratory wheezes, negative crackles Cardiovascular: Regular rate and rhythm without murmur gallop or rub  normal S1 and S2 Abdomen: Morbidly obese, negative abdominal pain, nondistended, positive soft, bowel sounds, no rebound, no ascites, no appreciable mass Extremities: No significant cyanosis, clubbing. Bilateral lower extremity pitting edema 2+ to thighs. RLE eschar calf (per patient secondary to striking her calf with a walker) negative discharge, negative erythema Psychiatric:  Negative depression, negative anxiety, negative fatigue, negative mania  Neurologic:  Cranial nerves II through XII intact, tongue/uvula midline, all extremities muscle strength 5/5, sensation intact throughout, negative dysarthria, negative expressive aphasia, negative receptive aphasia.   Data Reviewed: Basic Metabolic Panel:  Recent Labs Lab 05/26/15 2254 05/27/15 1612 05/28/15 0216 05/28/15 1225 05/29/15 0224  NA 139 140 138  --  139  K 5.0 5.0 5.3*  --  4.8  CL 106 111 110  --  108  CO2 19* 19* 20*  --  19*  GLUCOSE 155* 71 137*  --  195*  BUN 72* 71* 76*  --  83*  CREATININE 4.00* 3.87* 4.20*  --  4.18*  CALCIUM 8.3* 7.7* 7.6*  --  8.0*  MG  --   --   --  1.7  --   PHOS  --   --  5.2*  --  6.1*   Liver Function Tests:  Recent Labs Lab 05/26/15 2254 05/28/15 0216 05/29/15 0224  AST 30  --   --   ALT 12*  --   --   ALKPHOS 48  --   --   BILITOT 0.9  --   --   PROT 5.6*  --   --   ALBUMIN 2.2* 1.8*  1.9*   No results for input(s): LIPASE, AMYLASE in the last 168 hours. No results for input(s): AMMONIA in the last 168 hours. CBC:  Recent Labs Lab 05/26/15 2254 05/28/15 0216 05/29/15 0224  WBC 15.5* 11.8* 8.6  NEUTROABS 14.2*  --   --   HGB 9.4* 8.3* 8.9*  HCT 30.0* 27.7* 29.2*  MCV 93.5 95.5 92.7  PLT 268 248 300   Cardiac Enzymes:  Recent Labs Lab 05/27/15 0142 05/27/15 0601 05/27/15 1612  TROPONINI 0.06* 0.05* 0.05*   BNP (last 3 results)  Recent Labs  04/23/15 0008 05/27/15 0602  BNP 298.3* 376.7*    ProBNP (last 3 results) No results for input(s): PROBNP in  the last 8760 hours.  CBG:  Recent Labs Lab 05/29/15 0247 05/29/15 0611 05/29/15 0730 05/29/15 1234 05/29/15 1721  GLUCAP 167* 160* 162* 198* 242*    Recent Results (from the past 240 hour(s))  Culture, blood (routine x 2)     Status: None (Preliminary result)   Collection Time: 05/26/15 10:52 PM  Result Value Ref Range Status   Specimen Description BLOOD RIGHT HAND  Final   Special Requests BOTTLES DRAWN AEROBIC AND ANAEROBIC 5CC  Final   Culture NO GROWTH 3 DAYS  Final   Report Status PENDING  Incomplete  Culture, blood (routine x 2)     Status: None (Preliminary result)   Collection Time: 05/26/15 10:53 PM  Result Value Ref Range Status   Specimen Description BLOOD RIGHT FOREARM  Final   Special Requests BOTTLES DRAWN AEROBIC AND ANAEROBIC 5CC  Final   Culture NO GROWTH 3 DAYS  Final   Report Status PENDING  Incomplete  Urine culture     Status: None   Collection Time: 05/27/15  6:18 AM  Result Value Ref Range Status   Specimen Description URINE, CATHETERIZED  Final   Special Requests NONE  Final   Culture 2,000 COLONIES/mL INSIGNIFICANT GROWTH  Final   Report Status 05/28/2015 FINAL  Final     Studies:  Recent x-ray studies have been reviewed in detail by the Attending Physician  Scheduled Meds:  Scheduled Meds: . amLODipine  10 mg Oral Daily  . arformoterol  15 mcg Nebulization BID  . aspirin  81 mg Oral Daily  . carvedilol  25 mg Oral BID WC  . citalopram  20 mg Oral Daily  . cloNIDine  0.2 mg Oral BID  . dextromethorphan-guaiFENesin  1 tablet Oral BID  . febuxostat  40 mg Oral Daily  . fenofibrate  160 mg Oral Daily  . furosemide  80 mg Oral BID  . heparin  5,000 Units Subcutaneous 3 times per day  . insulin aspart  0-20 Units Subcutaneous 6 times per day  . insulin aspart protamine- aspart  55 Units Subcutaneous Q breakfast  . insulin aspart protamine- aspart  65 Units Subcutaneous Q supper  . ipratropium-albuterol  3 mL Nebulization QID  .  isosorbide mononitrate  30 mg Oral Daily  . methylPREDNISolone (SOLU-MEDROL) injection  60 mg Intravenous Q24H  . neomycin-bacitracin-polymyxin   Topical Daily  . nystatin cream   Topical BID  . omega-3 acid ethyl esters  1 g Oral Daily  . oxyCODONE-acetaminophen  1 tablet Oral BID   And  . oxyCODONE  5 mg Oral BID  . pantoprazole  40 mg Oral Daily  . piperacillin-tazobactam (ZOSYN)  IV  2.25 g Intravenous Q8H  . rosuvastatin  10 mg Oral Daily  . vancomycin  1,500 mg Intravenous Q48H  Time spent on care of this patient: 40 mins   WOODS, Geraldo Docker , MD  [facility] Office  [phone] Pager [phone]  On-Call/Text Page:      [email or web address]      password [email or web address]  If 7PM-7AM, please contact night-coverage [email or web address] Password [email or web address] 05/29/2015, 7:30 PM   LOS: 2 days   Care during the described time interval was provided by me .  I have reviewed this patient's available data, including medical history, events of note, physical examination, and all test results as part of my evaluation. I have personally reviewed and interpreted all radiology studies.   Dia Crawford, MD [phone] Pager

## 2015-05-29 NOTE — Progress Notes (Signed)
*  PRELIMINARY RESULTS* Vascular Ultrasound Renal Artery Duplex has been completed.   Study was extremely technically difficult and limited due to patient body habitus and poor patient cooperation. Unable to identify vasculature of bilateral kidneys including bilateral renal arteries and intrarenal arteries due to technical limitations, therefore this exam is essentially inconclusive.  05/29/2015 8:37 AM Maudry Mayhew, RVT, RDCS, RDMS

## 2015-05-29 NOTE — Progress Notes (Signed)
Pt has diffuse petechiae and rash over extremities moving into trunk. Upon assessment this evening, writer noticed that rash seemed to be moving up the trunk from buttocks into the middle of her back with red/violet streaking and petechiae noted. Physician was made aware. Also upon starting IV vancomycin this evening pt began to complain of some mild itching and burning in left upper extremity. IV Vanc paused. This writer inspected IV site for infiltration or signs of phlebitis with no visible signs seen at this time. Writer then flushed IV to check for patency and was successful with no evidence of pain or stinging. This Probation officer then began IV  Vanc back at a decreased rate of 167ml/hr for pt comfort. After a few mins pt called out stating that forearm was itching. Writer came in and inspected area again noticing a inflammed area of redness on the medial portion of her left forearm. IV Vanc stopped and MD made aware. Writer began new IV on opposite arm and restarted IV Vancomycin in new IV as per Dr order and was given 12.5mg  of IV benadryl for pt itching. At 2am left forearm IV was flushed and checked again for patency with no resistense or pain. IV Zosyn was then started in left forearm with no complaint of pain or itching. IV site was checked multiple times while medication was being administered with no changes in color, warmth or swelling. When zosyn was finished, IV was flushed and put as saline lock. Upon reassessment at 0430, Pt left forearm was noted to have more petechiae and redness as well as noted swelling. Pt had no complaints of pain and was able to perform PMS testing with no difficulty. Pt arm was elevated on pillow and MD made aware.

## 2015-05-30 DIAGNOSIS — I10 Essential (primary) hypertension: Secondary | ICD-10-CM

## 2015-05-30 LAB — COMPREHENSIVE METABOLIC PANEL
ALK PHOS: 45 U/L (ref 38–126)
ALT: 8 U/L — AB (ref 14–54)
AST: 18 U/L (ref 15–41)
Albumin: 1.8 g/dL — ABNORMAL LOW (ref 3.5–5.0)
Anion gap: 15 (ref 5–15)
BILIRUBIN TOTAL: 0.4 mg/dL (ref 0.3–1.2)
BUN: 94 mg/dL — ABNORMAL HIGH (ref 6–20)
CALCIUM: 8.4 mg/dL — AB (ref 8.9–10.3)
CO2: 20 mmol/L — ABNORMAL LOW (ref 22–32)
CREATININE: 3.79 mg/dL — AB (ref 0.44–1.00)
Chloride: 106 mmol/L (ref 101–111)
GFR, EST AFRICAN AMERICAN: 12 mL/min — AB (ref >60)
GFR, EST NON AFRICAN AMERICAN: 10 mL/min — AB (ref >60)
Glucose, Bld: 126 mg/dL — ABNORMAL HIGH (ref 65–99)
Potassium: 4.6 mmol/L (ref 3.5–5.1)
Sodium: 141 mmol/L (ref 135–145)
TOTAL PROTEIN: 5.8 g/dL — AB (ref 6.5–8.1)

## 2015-05-30 LAB — GLUCOSE, CAPILLARY
GLUCOSE-CAPILLARY: 132 mg/dL — AB (ref 65–99)
GLUCOSE-CAPILLARY: 80 mg/dL (ref 65–99)
GLUCOSE-CAPILLARY: 83 mg/dL (ref 65–99)
Glucose-Capillary: 108 mg/dL — ABNORMAL HIGH (ref 65–99)
Glucose-Capillary: 126 mg/dL — ABNORMAL HIGH (ref 65–99)
Glucose-Capillary: 92 mg/dL (ref 65–99)

## 2015-05-30 LAB — CBC WITH DIFFERENTIAL/PLATELET
Basophils Absolute: 0 10*3/uL (ref 0.0–0.1)
Basophils Relative: 0 %
EOS PCT: 0 %
Eosinophils Absolute: 0 10*3/uL (ref 0.0–0.7)
HEMATOCRIT: 29.3 % — AB (ref 36.0–46.0)
HEMOGLOBIN: 9.1 g/dL — AB (ref 12.0–15.0)
LYMPHS ABS: 0.6 10*3/uL — AB (ref 0.7–4.0)
LYMPHS PCT: 9 %
MCH: 28 pg (ref 26.0–34.0)
MCHC: 31.1 g/dL (ref 30.0–36.0)
MCV: 90.2 fL (ref 78.0–100.0)
Monocytes Absolute: 0.1 10*3/uL (ref 0.1–1.0)
Monocytes Relative: 1 %
NEUTROS ABS: 5.9 10*3/uL (ref 1.7–7.7)
Neutrophils Relative %: 90 %
PLATELETS: 329 10*3/uL (ref 150–400)
RBC: 3.25 MIL/uL — AB (ref 3.87–5.11)
RDW: 15.3 % (ref 11.5–15.5)
WBC: 6.5 10*3/uL (ref 4.0–10.5)

## 2015-05-30 LAB — MAGNESIUM: MAGNESIUM: 1.8 mg/dL (ref 1.7–2.4)

## 2015-05-30 MED ORDER — ISOSORBIDE MONONITRATE ER 30 MG PO TB24
45.0000 mg | ORAL_TABLET | Freq: Every day | ORAL | Status: DC
  Administered 2015-05-31 – 2015-06-04 (×5): 45 mg via ORAL
  Filled 2015-05-30 (×5): qty 2

## 2015-05-30 MED ORDER — CLONIDINE HCL ER 0.1 MG PO TB12
0.1000 mg | ORAL_TABLET | Freq: Once | ORAL | Status: AC
  Administered 2015-05-30: 0.1 mg via ORAL
  Filled 2015-05-30 (×2): qty 1

## 2015-05-30 MED ORDER — CLONIDINE HCL 0.1 MG PO TABS
0.3000 mg | ORAL_TABLET | Freq: Two times a day (BID) | ORAL | Status: DC
  Administered 2015-05-30 – 2015-06-04 (×10): 0.3 mg via ORAL
  Filled 2015-05-30 (×11): qty 1

## 2015-05-30 MED ORDER — VANCOMYCIN HCL 10 G IV SOLR
1500.0000 mg | INTRAVENOUS | Status: DC
  Filled 2015-05-30: qty 1500

## 2015-05-30 MED ORDER — PREDNISONE 20 MG PO TABS: 20.0000 mg | ORAL_TABLET | Freq: Every day | ORAL | Status: DC

## 2015-05-30 MED ORDER — PREDNISONE 50 MG PO TABS
60.0000 mg | ORAL_TABLET | Freq: Every day | ORAL | Status: DC
  Administered 2015-05-31 – 2015-06-01 (×2): 60 mg via ORAL
  Filled 2015-05-30 (×4): qty 1

## 2015-05-30 MED ORDER — ISOSORBIDE MONONITRATE ER 30 MG PO TB24
15.0000 mg | ORAL_TABLET | Freq: Once | ORAL | Status: AC
  Administered 2015-05-30: 15 mg via ORAL
  Filled 2015-05-30: qty 1

## 2015-05-30 NOTE — Progress Notes (Addendum)
[place] TEAM 1 - Stepdown/ICU TEAM Progress Note  Anna [name] GHW:[id number] DOB: 06/19/1934 DOA: 05/26/2015 PCP: Chevis Pretty, FNP  Admit HPI / Brief Narrative: 81 y.o. WF PMHx Depression, Anxiety, HTN, Chronic Diastolic CHF, HLD, DM Type 2 , COPD, GERD, Gout, symptomatic  Cholelithiasis S/P  Laparoscopic Cholecystectomy on 05/25/15, Obesity,  CKD stage IV, Anemia,   Who presents with fever, cough, shortness of breath.  Pt was hospitalized from 1/23 to 05/26/15 due to symptomatic cholelithiasis. She had laparoscopic cholecystectomy by dr. Ninfa Linden on 05/25/15. She was just discharged home in the morning. She reports that she started having dry cough, fever, chills, shortness of breath after she went home. She also has mild chest pain acrossing front the chest. She states that she has mild pain over surgical sites, but no abdominal pain, diarrhea, symptoms of UTI. Patient has rashes over both lower legs which started on 05/02/15. She does not have unilateral weakness, hematuria, hematochezia, nausea, vomiting.  In ED, patient was found to have WBC 15.5, temperature 101.5, tachycardia, tachypnea, worsening renal function with cre 4.00, lactate 0.92. Chest x-ray showed left lower lobe infiltration with small amount of effusion. Patient admitted to inpatient for further events and treatment.  HPI/Subjective:  1/29 A/O 4, states feels better than at admission. Sitting in bed comfortably, continued rash on arms/legs/torso however improved. Negative pruritic, positive erythema, nonblanching     Assessment/Plan: Sepsis due to HCAP:  -Patient's fever, cough, shortness of breath and chest x-ray findings are consistent with HCAP. She is septic with leukocytosis, tachycardia, tachypnea, fever. She is hemodynamically stable. Lactic acid is normal. She had one Bp measurements with low blood pressure of 85/66 in ED, but per nurse, that measurement was done when pt in a position compressing her arm.    - IV Vancomycin and Zosyn complete 7 day course - Mucinex DM BID - DuoNeb QID -Flutter valve -Solumedrol 60 mg Daily--> change to 60 mg Prednisone daily. Since patient has what appears to be some type of vasculitis mediated by her sepsis will need to be slowly titrated down over time to prevent rebound.  - Urine strep pneumo urine antigen negative, legionella pending - influenza negative  COPD:  -Brovana BID -See sepsis  OSA? -Patient desats when she sleeps most likely has sleep apnea/obesity hypoventilation syndrome -CPAP at night  Chronic Diastolic cardiac heart failure:  -Patient with 1+ pedal edema bilateral -Lasix 80 mg BID -Strict in and out since admission + 840 L -Daily weight admission weight= 102.5 kg       1/29 weight= ??? -Amlodipine 10 mg daily -Coreg 25 mg BID -Increase Clonidine 0.3 mg BID -Increase Imdur 45 mg daily -Hydralazine PRN -BNP= 376.7 -Transfuse for hemoglobin<8  DM-II: Controlled - 04/20/2015 hemoglobin A1c = 6.3  -Continue home 70/30 easily, 55 units in the morning and 65 units in the evening -Resistant SSI   HTN: -See chronic diastolic CHF  HLD:  -Last LDL was 27 on 02/26/15 -Continue home Crestor 10 mg daily  GERD: -Protonix  Anemia, iron deficiency:  - Occult blood; positive -Hemoglobin stable. Continue to monitor -Continue iron supplement  Acute on CKD-IV: (Baseline Cr 1.6~2.2) - Her creatinine is 4.00 and BUN 72 on admission. Likely due to prerenal failure or dehydration and continuation of diuretics -Check FeUrea and Renal US -Nephrology consulted; also believes may be some sort of MCTD -Cr continues to improve. High of 4.2--> 3.79 -Unless nephrology presses issue would not recommend renal biopsy at this time  Symptomatic cholelithiasis:  -  s/p of laparoscopic cholecystectomy. No acute issues. Doing well.  -observe closely. -prn percocet for pain  Depression and anxiety:  -Stable, no suicidal or homicidal  ideations. -Continue Celexa 20 mg daily  Rash in legs/back:---> MCTD/vasculitis of skin?  -Unclear etiology. Not tender, not itchy. Patient missed an appointment with dermatologist due to recent surgery -Follow-up with dermatologist -Begin MCTD workup -Rash is now on palms pain as well as extremities. Maculopapular in nature,.nonpruritic, nonblanching -Obtained additional labs; pending see serology below. Rash appears to be improving with patient's improved sepsis. Unless nephrology presses issue would not recommend skin biopsy at this time. -See, sepsis.    Gout:  -stable -continue home uloric 40 mg daily  Hyperkalemia - resolved   Code Status: FULL Family Communication:  family present at time of exam Disposition Plan: Home vs SNF    Consultants: Dr.[facility]  Procedure/Significant Events: 12/21 echocardiogram;- Left ventricle: mild concentric hypertrophy. -LVEF= 55% to 60%. - (grade 2 diastolic dysfunction). - Left atrium: moderately dilated. - Pulmonary arteries: PA peak pressure: 31 mm Hg (S). 1/25 CXR;-patchy LLL airspace consolidation/small left pleural effusion, developing pneumonia.?   Serologies -C3/C4 WNL -Myeloperoxidase antibody/P- ANKA WNL -ANA negative -GBM antibody negative -RPR negative -Anti-scleroderma antibody pending  -anti-double-stranded DNA pending -Sjogrens Ab A/B pending -Anti-RNP pending  -ESR high 97 -  Culture 1/25 blood right hand/forearm NGTD 1/26 urine insignificant growth 1/26 strep pneumo urine antigen negative 1/26 influenza A/B/H1N1 negative 1/26 sputum pending 1/27 sputum pending 1/28 acute hepatitis panel pending 1/28 HIV 1/2 negative   Antibiotics: Zosyn 1/26>> Vancomycin 1/25>>  DVT prophylaxis:    Devices    LINES / TUBES:      Continuous Infusions:   Objective: VITAL SIGNS: Temp: 97.5 F (36.4 C) (01/29 0800) Temp Source: Oral (01/29 0800) BP: 189/70 mmHg (01/29 0800) Pulse  Rate: 61 (01/29 0800) SPO2; FIO2:   Intake/Output Summary (Last 24 hours) at 05/30/15 1202 Last data filed at 05/30/15 0421  Gross per 24 hour  Intake    770 ml  Output   2000 ml  Net  -1230 ml     Exam: General:  A/O 4, states feels better than at admission., No acute respiratory distress Eyes: Negative headache, double vision,negative scleral hemorrhage ENT: Negative Runny nose, negative gingival bleeding, Neck:  Negative scars, masses, torticollis, lymphadenopathy, JVD Lungs: diffuse poor air movement (significantly improved), negative wheezes, mild LLL crackles Cardiovascular: Regular rate and rhythm without murmur gallop or rub normal S1 and S2 Abdomen: Morbidly obese, negative abdominal pain, nondistended, positive soft, bowel sounds, no rebound, no ascites, no appreciable mass Extremities: No significant cyanosis, clubbing. Bilateral lower extremity pitting edema 1+ to thighs. RLE eschar calf (per patient secondary to striking her calf with a walker) negative discharge, negative erythema Skin; diffuse macule papular rash on extremities and back, nonblanching, non-pruritic, erythematous. Rash is on palms of hands. Appears to be resolving with decreased erythema on legs and arms Psychiatric:  Negative depression, negative anxiety, negative fatigue, negative mania  Neurologic:  Cranial nerves II through XII intact, tongue/uvula midline, all extremities muscle strength 5/5, sensation intact throughout, negative dysarthria, negative expressive aphasia, negative receptive aphasia.   Data Reviewed: Basic Metabolic Panel:  Recent Labs Lab 05/26/15 2254 05/27/15 1612 05/28/15 0216 05/28/15 1225 05/29/15 0224 05/30/15 0413  NA 139 140 138  --  139 141  K 5.0 5.0 5.3*  --  4.8 4.6  CL 106 111 110  --  108 106  CO2 19* 19* 20*  --  19* 20*  GLUCOSE 155* 71 137*  --  195* 126*  BUN 72* 71* 76*  --  83* 94*  CREATININE 4.00* 3.87* 4.20*  --  4.18* 3.79*  CALCIUM 8.3* 7.7* 7.6*   --  8.0* 8.4*  MG  --   --   --  1.7  --  1.8  PHOS  --   --  5.2*  --  6.1*  --    Liver Function Tests:  Recent Labs Lab 05/26/15 2254 05/28/15 0216 05/29/15 0224 05/30/15 0413  AST 30  --   --  18  ALT 12*  --   --  8*  ALKPHOS 48  --   --  45  BILITOT 0.9  --   --  0.4  PROT 5.6*  --   --  5.8*  ALBUMIN 2.2* 1.8* 1.9* 1.8*   No results for input(s): LIPASE, AMYLASE in the last 168 hours. No results for input(s): AMMONIA in the last 168 hours. CBC:  Recent Labs Lab 05/26/15 2254 05/28/15 0216 05/29/15 0224 05/30/15 0413  WBC 15.5* 11.8* 8.6 6.5  NEUTROABS 14.2*  --   --  5.9  HGB 9.4* 8.3* 8.9* 9.1*  HCT 30.0* 27.7* 29.2* 29.3*  MCV 93.5 95.5 92.7 90.2  PLT 268 248 300 329   Cardiac Enzymes:  Recent Labs Lab 05/27/15 0142 05/27/15 0601 05/27/15 1612  TROPONINI 0.06* 0.05* 0.05*   BNP (last 3 results)  Recent Labs  04/23/15 0008 05/27/15 0602  BNP 298.3* 376.7*    ProBNP (last 3 results) No results for input(s): PROBNP in the last 8760 hours.  CBG:  Recent Labs Lab 05/29/15 2001 05/29/15 2146 05/30/15 0007 05/30/15 0440 05/30/15 0838  GLUCAP 235* 176* 132* 108* 126*    Recent Results (from the past 240 hour(s))  Culture, blood (routine x 2)     Status: None (Preliminary result)   Collection Time: 05/26/15 10:52 PM  Result Value Ref Range Status   Specimen Description BLOOD RIGHT HAND  Final   Special Requests BOTTLES DRAWN AEROBIC AND ANAEROBIC 5CC  Final   Culture NO GROWTH 3 DAYS  Final   Report Status PENDING  Incomplete  Culture, blood (routine x 2)     Status: None (Preliminary result)   Collection Time: 05/26/15 10:53 PM  Result Value Ref Range Status   Specimen Description BLOOD RIGHT FOREARM  Final   Special Requests BOTTLES DRAWN AEROBIC AND ANAEROBIC 5CC  Final   Culture NO GROWTH 3 DAYS  Final   Report Status PENDING  Incomplete  Urine culture     Status: None   Collection Time: 05/27/15  6:18 AM  Result Value Ref  Range Status   Specimen Description URINE, CATHETERIZED  Final   Special Requests NONE  Final   Culture 2,000 COLONIES/mL INSIGNIFICANT GROWTH  Final   Report Status 05/28/2015 FINAL  Final     Studies:  Recent x-ray studies have been reviewed in detail by the Attending Physician  Scheduled Meds:  Scheduled Meds: . amLODipine  10 mg Oral Daily  . arformoterol  15 mcg Nebulization BID  . aspirin  81 mg Oral Daily  . carvedilol  25 mg Oral BID WC  . citalopram  20 mg Oral Daily  . cloNIDine  0.3 mg Oral BID  . dextromethorphan-guaiFENesin  1 tablet Oral BID  . febuxostat  40 mg Oral Daily  . fenofibrate  160 mg Oral Daily  . furosemide  80 mg Oral BID  .  heparin  5,000 Units Subcutaneous 3 times per day  . insulin aspart  0-20 Units Subcutaneous 6 times per day  . insulin aspart protamine- aspart  55 Units Subcutaneous Q breakfast  . insulin aspart protamine- aspart  65 Units Subcutaneous Q supper  . ipratropium-albuterol  3 mL Nebulization QID  . [START ON 05/31/2015] isosorbide mononitrate  45 mg Oral Daily  . neomycin-bacitracin-polymyxin   Topical Daily  . nystatin cream   Topical BID  . omega-3 acid ethyl esters  1 g Oral Daily  . oxyCODONE-acetaminophen  1 tablet Oral BID   And  . oxyCODONE  5 mg Oral BID  . pantoprazole  40 mg Oral Daily  . piperacillin-tazobactam (ZOSYN)  IV  2.25 g Intravenous Q8H  . [START ON 05/31/2015] predniSONE  20 mg Oral Q breakfast  . rosuvastatin  10 mg Oral Daily  . vancomycin  1,500 mg Intravenous Q48H    Time spent on care of this patient: 40 mins   Jakeb Lamping, Geraldo Docker , MD  [facility] Office  [phone] Pager [phone]  On-Call/Text Page:      [email or web address]      password [email or web address]  If 7PM-7AM, please contact night-coverage [email or web address] Password [email or web address] 05/30/2015, 12:02 PM   LOS: 3 days   Care during the described time interval was provided by me .  I have reviewed this patient's available data, including medical  history, events of note, physical examination, and all test results as part of my evaluation. I have personally reviewed and interpreted all radiology studies.   Dia Crawford, MD [phone] Pager

## 2015-05-30 NOTE — Progress Notes (Signed)
Pharmacy Antibiotic Follow-up Note  Anna [name] is a 81 y.o. year-old female admitted on 05/26/2015.  The patient is currently on day 4 of Vancomycin and Zosyn for HCAP.  Assessment WBC wnl, afebrile, PCT 15, LA neg. CrCl ~ 15 mL/min    Plan: Zosyn 2.25 grams iv Q 8 hours starting at 8 am Vancomycin 1500 mg iv Q 48 hours Plan to complete 7 day course  Follow up cultures, progress, fever trend  Temp (24hrs), Avg:97.8 F (36.6 C), Min:97.3 F (36.3 C), Max:98.7 F (37.1 C)   Recent Labs Lab 05/26/15 2254 05/28/15 0216 05/29/15 0224 05/30/15 0413  WBC 15.5* 11.8* 8.6 6.5    Recent Labs Lab 05/26/15 2254 05/27/15 1612 05/28/15 0216 05/29/15 0224 05/30/15 0413  CREATININE 4.00* 3.87* 4.20* 4.18* 3.79*   Estimated Creatinine Clearance: 15.1 mL/min (by C-G formula based on Cr of 3.79).    Allergies  Allergen Reactions  . Ace Inhibitors Other (See Comments)    Kidney failure  . Niaspan [Niacin Er] Other (See Comments)    flushing  . Other Rash    Pt started Norvasc, clonidine, and isosorbide on 04-26-15.  Not sure which one is causing the rash.  Pls update after pt goes to MD today    Antimicrobials this admission: Vancomycin 1/25> Zosyn 1/25>>  Levels/dose changes this admission: None   Microbiology results: S pneumo neg Flu neg 1/26 Ucx>> insignificant growth  1/25 BCx2>> ngtd Legionella neg   Thank you for allowing pharmacy to be a part of this patient's care.  Albertina Parr, PharmD., BCPS Clinical Pharmacist Phone [phone]

## 2015-05-30 NOTE — Progress Notes (Signed)
Paged [place] about patient's blood sugar.  Blood sugar was 85 tonight so sliding scale was not given.  Paged to ask MD about 70/30 mix that patient would receive tonight and he stated that we should hold it for tonight.

## 2015-05-30 NOTE — Progress Notes (Signed)
Patient ID: Anna [name], female   DOB: 11-27-1934, 81 y.o.   MRN: [id number] S:Feels better O:BP 167/62 mmHg  Pulse 61  Temp(Src) 97.6 F (36.4 C) (Oral)  Resp 16  Ht 5\' 4"  (1.626 m)  Wt 113.7 kg (250 lb 10.6 oz)  BMI 43.01 kg/m2  SpO2 96%  Intake/Output Summary (Last 24 hours) at 05/30/15 0852 Last data filed at 05/30/15 0421  Gross per 24 hour  Intake   1130 ml  Output   2000 ml  Net   -870 ml   Intake/Output: I/O last 3 completed shifts: In: 2040 [P.O.:1440; IV Piggyback:600] Out: 2600 [Urine:2600]  Intake/Output this shift:    Weight change: -0.9 kg (-1 lb 15.7 oz) Gen:WD WN WF in NAD CVS:no rub Resp:decreased inspiratory effort [id number], +BS Ext:1+ edema, diffuse petechial rash on palms/hands/abdomen/lower extremities in varying degrees of resolution   Recent Labs Lab 05/26/15 2254 05/27/15 1612 05/28/15 0216 05/29/15 0224 05/30/15 0413  NA 139 140 138 139 141  K 5.0 5.0 5.3* 4.8 4.6  CL 106 111 110 108 106  CO2 19* 19* 20* 19* 20*  GLUCOSE 155* 71 137* 195* 126*  BUN 72* 71* 76* 83* 94*  CREATININE 4.00* 3.87* 4.20* 4.18* 3.79*  ALBUMIN 2.2*  --  1.8* 1.9* 1.8*  CALCIUM 8.3* 7.7* 7.6* 8.0* 8.4*  PHOS  --   --  5.2* 6.1*  --   AST 30  --   --   --  18  ALT 12*  --   --   --  8*   Liver Function Tests:  Recent Labs Lab 05/26/15 2254 05/28/15 0216 05/29/15 0224 05/30/15 0413  AST 30  --   --  18  ALT 12*  --   --  8*  ALKPHOS 48  --   --  45  BILITOT 0.9  --   --  0.4  PROT 5.6*  --   --  5.8*  ALBUMIN 2.2* 1.8* 1.9* 1.8*   No results for input(s): LIPASE, AMYLASE in the last 168 hours. No results for input(s): AMMONIA in the last 168 hours. CBC:  Recent Labs Lab 05/26/15 2254 05/28/15 0216 05/29/15 0224 05/30/15 0413  WBC 15.5* 11.8* 8.6 6.5  NEUTROABS 14.2*  --   --  5.9  HGB 9.4* 8.3* 8.9* 9.1*  HCT 30.0* 27.7* 29.2* 29.3*  MCV 93.5 95.5 92.7 90.2  PLT 268 248 300 329   Cardiac Enzymes:  Recent Labs Lab 05/27/15 0142  05/27/15 0601 05/27/15 1612  TROPONINI 0.06* 0.05* 0.05*   CBG:  Recent Labs Lab 05/29/15 1721 05/29/15 2001 05/29/15 2146 05/30/15 0007 05/30/15 0440  GLUCAP 242* 235* 176* 132* 108*    Iron Studies:  Recent Labs  05/27/15 1612  IRON 7*  TIBC 225*  FERRITIN 131   Studies/Results: No results found. [name] amLODipine  10 mg Oral Daily  . arformoterol  15 mcg Nebulization BID  . aspirin  81 mg Oral Daily  . carvedilol  25 mg Oral BID WC  . citalopram  20 mg Oral Daily  . cloNIDine  0.2 mg Oral BID  . dextromethorphan-guaiFENesin  1 tablet Oral BID  . febuxostat  40 mg Oral Daily  . fenofibrate  160 mg Oral Daily  . furosemide  80 mg Oral BID  . heparin  5,000 Units Subcutaneous 3 times per day  . insulin aspart  0-20 Units Subcutaneous 6 times per day  . insulin aspart protamine- aspart  55 Units Subcutaneous  Q breakfast  . insulin aspart protamine- aspart  65 Units Subcutaneous Q supper  . ipratropium-albuterol  3 mL Nebulization QID  . isosorbide mononitrate  30 mg Oral Daily  . methylPREDNISolone (SOLU-MEDROL) injection  60 mg Intravenous Q24H  . neomycin-bacitracin-polymyxin   Topical Daily  . nystatin cream   Topical BID  . omega-3 acid ethyl esters  1 g Oral Daily  . oxyCODONE-acetaminophen  1 tablet Oral BID   And  . oxyCODONE  5 mg Oral BID  . pantoprazole  40 mg Oral Daily  . piperacillin-tazobactam (ZOSYN)  IV  2.25 g Intravenous Q8H  . rosuvastatin  10 mg Oral Daily  . vancomycin  1,500 mg Intravenous Q48H    BMET    Component Value Date/Time   NA 141 05/30/2015 0413   NA 141 02/26/2015 1049   K 4.6 05/30/2015 0413   CL 106 05/30/2015 0413   CO2 20* 05/30/2015 0413   GLUCOSE 126* 05/30/2015 0413   GLUCOSE 228* 02/26/2015 1049   BUN 94* 05/30/2015 0413   BUN 50* 02/26/2015 1049   CREATININE 3.79* 05/30/2015 0413   CREATININE 2.11* 11/13/2012 1122   CALCIUM 8.4* 05/30/2015 0413   GFRNONAA 10* 05/30/2015 0413   GFRNONAA 22* 11/13/2012 1122    GFRAA 12* 05/30/2015 0413   GFRAA 26* 11/13/2012 1122   CBC    Component Value Date/Time   WBC 6.5 05/30/2015 0413   WBC 6.3 05/14/2015 1524   WBC 5.5 11/26/2014 1045   RBC 3.25* 05/30/2015 0413   RBC 3.56* 05/14/2015 1524   RBC 3.80* 11/26/2014 1045   HGB 9.1* 05/30/2015 0413   HGB 12.0* 04/30/2015 1147   HCT 29.3* 05/30/2015 0413   HCT 32.4* 05/14/2015 1524   HCT 34.0* 11/26/2014 1045   PLT 329 05/30/2015 0413   PLT 252 05/14/2015 1524   MCV 90.2 05/30/2015 0413   MCV 91 05/14/2015 1524   MCV 89.5 11/26/2014 1045   MCH 28.0 05/30/2015 0413   MCH 28.7 05/14/2015 1524   MCH 29.1 11/26/2014 1045   MCHC 31.1 05/30/2015 0413   MCHC 31.5 05/14/2015 1524   MCHC 32.5 11/26/2014 1045   RDW 15.3 05/30/2015 0413   RDW 16.7* 05/14/2015 1524   LYMPHSABS 0.6* 05/30/2015 0413   LYMPHSABS 1.0 05/14/2015 1524   MONOABS 0.1 05/30/2015 0413   EOSABS 0.0 05/30/2015 0413   EOSABS 0.1 05/14/2015 1524   EOSABS 0.1 05/21/2014 1110   BASOSABS 0.0 05/30/2015 0413   BASOSABS 0.0 05/14/2015 1524     Assessment/Plan:  1. AKI/CKD stage 3 (baseline Scr 1.6-2 followed by Dr. Lorrene Reid) s/p lap chole who presents with fever, rash, recent onset of hematuria and proteinuria and ulcers on lower extremities with a jump in Scr and concern for vasculitis 1. Negative ANA, ANCA, anti-GBM, SPEP, normal complements. 2. Consider trial of pulse IV solumedrol if ok with primary team although concerned with active infection (HCAP) and labile DM 3. Discussed with son and daughter that patient is a poor candidate for immunosuppressive agents if warranted in light of her acute infection and debilitated state, therefore would not pursue renal biopsy until her PNA and overall condition improve. 4. Would need to weigh the risks and benefits of renal biopsy if she is not a candidate for immunosuppressive therapy. 5. Possible HSP with presentation and rash, will check immunoglobulins. 2. Hyperkalemia- due to #1- treat  with kayexalate and follow 3. Metabolic acidosis- due to #1, improving 4. HCAP/LLL infiltrate with fevers and cough- currently on  zosyn and vanc 5. ABLA s/p lap chole, follow and transfuse prn 6. DM- labile. Management per primary svc 7. Leg ulcers- wound care consulted 8. Rash- would benefit from biopsy 9. Disposition- pt is DNR  [name]

## 2015-05-30 NOTE — Progress Notes (Signed)
Patient is refusing to wear CPAP tonight. She stated she did wear it last night but does not want it tonight. RT informed patient if she changes her mind have RN contact rt.

## 2015-05-30 NOTE — Progress Notes (Signed)
Report received from Jennifer RN.

## 2015-05-30 NOTE — Progress Notes (Signed)
Patient transferred with O2 to [phone] with all belongings, VSS, patient in no distress. Patient left in room [phone] with bed in locked & in lowest position, O2 hooked back up to wall at 2L, call bell within reach, and multiple family members at bedside.

## 2015-05-30 NOTE — Progress Notes (Signed)
NURSING PROGRESS NOTE  [name] [id number] Transfer Data: 05/30/2015 4:16 PM Attending Provider: Allie Bossier, MD PO:[id number] MARGARET, FNP Code Status: DNR  Anna [name] is a 81 y.o. female patient transferred from [place]  -No acute distress noted.  -No complaints of shortness of breath.  -No complaints of chest pain.   Cardiac Monitoring: Box # 10 in place. Cardiac monitor yields:normal sinus rhythm.  Blood pressure 147/48, pulse 58, temperature 97.5 F (36.4 C), temperature source Oral, resp. rate 20, height 5\' 4"  (1.626 m), weight 113.7 kg (250 lb 10.6 oz), SpO2 97 %.   IV Fluids:  IV in place, occlusive dsg intact without redness, IV cath forearm right, condition patent and no redness none.   Allergies:  Ace inhibitors; Niaspan; and Other  Past Medical History:   has a past medical history of Hypertension; COPD (chronic obstructive pulmonary disease) ([place]); Neuromuscular disorder ([place]); Depression with anxiety; Osteopenia; Arthritis; GERD (gastroesophageal reflux disease); Hyperlipidemia; Obesity; Vitamin B12 deficiency; Chronic kidney disease (CKD), stage IV (severe) ([place]); Sleep apnea; Anemia; adenomatous colonic polyps (09/2014); Diabetes mellitus without complication ([place]); HNP (herniated nucleus pulposus with myelopathy), thoracic; Diastolic CHF ([place]); and Pneumonia (05/2015).  Past Surgical History:   has past surgical history that includes Breast surgery (April 1996); Tubal ligation; Cataract extraction, bilateral (Bilateral); Esophagogastroduodenoscopy (N/A, 10/19/2014); Colonoscopy (N/A, 10/19/2014); ERCP (N/A, 04/22/2015); Laparoscopic cholecystectomy (05/25/2015); and Cholecystectomy (N/A, 05/25/2015).  Social History:   reports that she has quit smoking. Her smoking use included Cigarettes. She has a 120 pack-year smoking history. She has never used smokeless tobacco. She reports that she does not drink alcohol or use illicit drugs.  Skin: rash bilateral legs,  arms, back and stomach treating with nystatin cream  Patient/Family orientated to room. Information packet given to patient/family. Admission inpatient armband information verified with patient/family to include name and date of birth and placed on patient arm. Side rails up x 2, fall assessment and education completed with patient/family. Patient/family able to verbalize understanding of risk associated with falls and verbalized understanding to call for assistance before getting out of bed. Call light within reach. Patient/family able to voice and demonstrate understanding of unit orientation instructions.    Will continue to evaluate and treat per MD orders.

## 2015-05-31 ENCOUNTER — Encounter (HOSPITAL_COMMUNITY): Payer: Self-pay | Admitting: General Surgery

## 2015-05-31 ENCOUNTER — Inpatient Hospital Stay (HOSPITAL_COMMUNITY): Payer: Medicare Other

## 2015-05-31 LAB — RENAL FUNCTION PANEL
ALBUMIN: 1.9 g/dL — AB (ref 3.5–5.0)
Anion gap: 11 (ref 5–15)
BUN: 105 mg/dL — ABNORMAL HIGH (ref 6–20)
CALCIUM: 8.3 mg/dL — AB (ref 8.9–10.3)
CO2: 23 mmol/L (ref 22–32)
CREATININE: 3.88 mg/dL — AB (ref 0.44–1.00)
Chloride: 107 mmol/L (ref 101–111)
GFR, EST AFRICAN AMERICAN: 12 mL/min — AB (ref >60)
GFR, EST NON AFRICAN AMERICAN: 10 mL/min — AB (ref >60)
Glucose, Bld: 81 mg/dL (ref 65–99)
Phosphorus: 6.6 mg/dL — ABNORMAL HIGH (ref 2.5–4.6)
Potassium: 4.1 mmol/L (ref 3.5–5.1)
SODIUM: 141 mmol/L (ref 135–145)

## 2015-05-31 LAB — GLUCOSE, CAPILLARY
GLUCOSE-CAPILLARY: 154 mg/dL — AB (ref 65–99)
GLUCOSE-CAPILLARY: 223 mg/dL — AB (ref 65–99)
GLUCOSE-CAPILLARY: 63 mg/dL — AB (ref 65–99)
GLUCOSE-CAPILLARY: 75 mg/dL (ref 65–99)
Glucose-Capillary: 104 mg/dL — ABNORMAL HIGH (ref 65–99)
Glucose-Capillary: 82 mg/dL (ref 65–99)
Glucose-Capillary: 167 mg/dL — ABNORMAL HIGH (ref 65–99)

## 2015-05-31 LAB — CULTURE, BLOOD (ROUTINE X 2)
Culture: NO GROWTH
Culture: NO GROWTH

## 2015-05-31 LAB — CBC
HCT: 31 % — ABNORMAL LOW (ref 36.0–46.0)
Hemoglobin: 9.9 g/dL — ABNORMAL LOW (ref 12.0–15.0)
MCH: 28.5 pg (ref 26.0–34.0)
MCHC: 31.9 g/dL (ref 30.0–36.0)
MCV: 89.3 fL (ref 78.0–100.0)
PLATELETS: 358 10*3/uL (ref 150–400)
RBC: 3.47 MIL/uL — AB (ref 3.87–5.11)
RDW: 15.5 % (ref 11.5–15.5)
WBC: 8.9 10*3/uL (ref 4.0–10.5)

## 2015-05-31 LAB — IGG, IGA, IGM
IGA: 277 mg/dL (ref 64–422)
IGG (IMMUNOGLOBIN G), SERUM: 721 mg/dL (ref 700–1600)
IgM, Serum: 92 mg/dL (ref 26–217)

## 2015-05-31 LAB — CRYOGLOBULIN

## 2015-05-31 MED ORDER — LEVOFLOXACIN 500 MG PO TABS: 500.0000 mg | ORAL_TABLET | ORAL | Status: DC

## 2015-05-31 MED ORDER — INSULIN ASPART 100 UNIT/ML ~~LOC~~ SOLN: 0.0000 [IU] | Freq: Every day | SUBCUTANEOUS | Status: DC

## 2015-05-31 MED ORDER — INSULIN ASPART 100 UNIT/ML ~~LOC~~ SOLN
0.0000 [IU] | Freq: Three times a day (TID) | SUBCUTANEOUS | Status: DC
  Administered 2015-05-31: 2 [IU] via SUBCUTANEOUS
  Administered 2015-06-01 (×2): 3 [IU] via SUBCUTANEOUS

## 2015-05-31 MED ORDER — IPRATROPIUM-ALBUTEROL 0.5-2.5 (3) MG/3ML IN SOLN
3.0000 mL | Freq: Three times a day (TID) | RESPIRATORY_TRACT | Status: DC
  Administered 2015-05-31 – 2015-06-03 (×8): 3 mL via RESPIRATORY_TRACT
  Filled 2015-05-31 (×8): qty 3

## 2015-05-31 MED ORDER — LIDOCAINE HCL (PF) 2 % IJ SOLN
10.0000 mL | Freq: Once | INTRAMUSCULAR | Status: DC | PRN
  Filled 2015-05-31: qty 10

## 2015-05-31 MED ORDER — INSULIN ASPART PROT & ASPART (70-30 MIX) 100 UNIT/ML ~~LOC~~ SUSP
55.0000 [IU] | Freq: Every day | SUBCUTANEOUS | Status: DC
  Administered 2015-05-31 – 2015-06-03 (×4): 55 [IU] via SUBCUTANEOUS
  Filled 2015-05-31 (×2): qty 10

## 2015-05-31 MED ORDER — LEVOFLOXACIN 500 MG PO TABS
500.0000 mg | ORAL_TABLET | ORAL | Status: AC
  Administered 2015-05-31: 500 mg via ORAL
  Filled 2015-05-31: qty 1

## 2015-05-31 NOTE — Progress Notes (Signed)
Progress Note  Anna [name] WNU:[id number] DOB: 1934/07/29 DOA: 05/26/2015 PCP: Chevis Pretty, FNP  Admit HPI / Brief Narrative:  81 y.o. WF PMHx Depression, Anxiety, HTN, Chronic Diastolic CHF, HLD, DM Type 2 , COPD, GERD, Gout, symptomatic  Cholelithiasis S/P  Laparoscopic Cholecystectomy on 05/25/15, Obesity,  CKD stage IV, Anemia, Who presents with fever, cough, shortness of breath.  Pt was hospitalized from 1/23 to 05/26/15 due to symptomatic cholelithiasis. She had laparoscopic cholecystectomy by dr. Ninfa Linden on 05/25/15. She was just discharged home in the morning. She reports that she started having dry cough, fever, chills, shortness of breath after she went home. She also has mild chest pain acrossing front the chest. She states that she has mild pain over surgical sites, but no abdominal pain, diarrhea, symptoms of UTI. Patient has rashes over both lower legs which started on 05/02/15. She does not have unilateral weakness, hematuria, hematochezia, nausea, vomiting.  In ED, patient was found to have WBC 15.5, temperature 101.5, tachycardia, tachypnea, worsening renal function with cre 4.00, lactate 0.92. Chest x-ray showed left lower lobe infiltration with small amount of effusion. Patient admitted to inpatient for further events and treatment.   HPI/Subjective:  Patient in bed appears comfortable, denies any fever or chills, no headache, no chest or abdominal pain. No cough.   Assessment/Plan:  Sepsis due to HCAP:  Initially had sepsis, so far all cultures negative, was on vancomycin and Zosyn and will taper to oral Levaquin on 05/31/2015 dosed by pharmacy based on her renal function. Supportive care continued. She had been placed on steroids for possible vasculitis will gently taper.  COPD: -No wheezing she is stable, continue Brovana BID  OSA? -CPAP at night. Outpatient sleep study.  Acute on chronic Chronic Diastolic cardiac heart failure: EF 55% Has edema, renal  following, currently on Lasix 80 mg twice a day, monitor intake and output and weights, continue Coreg, hydralazine and Imdur. Monitor clinically.   HTN: Blood pressure stable continue combination of Coreg, clonidine, Imdur, hydralazine and monitor  HLD:  On Crestor 10 mg daily continue  GERD:  Protonix  Anemia, iron deficiency:  Hemoccult positive, on iron supplementation, no need for transfusion, no brisk bleeding. Outpatient age-appropriate GI follow-up. Continue PPI.   Acute on CKD-IV: (Baseline Cr 1.6~2.2) - Renal ultrasound nonacute, renal vascular duplex inconclusive, does have proteinuria and hematuria on UA, renal following, currently on steroids. We will defer further management to renal. Immunological panel pending.  Symptomatic cholelithiasis: -s/p of laparoscopic cholecystectomy. No acute issues. Doing well.   Depression and anxiety: -Stable, no suicidal or homicidal ideations. Continue Celexa 20 mg daily  Macular Rash in upper extremities, legs and back:---  MCTD/vasculitis of skin? Unclear etiology. Not tender, not itchy. Patient missed an appointment with dermatologist due to recent surgery, since there is question of vasculitis with worsening renal function and she's currently been placed on empiric steroids. I will request surgery will obtain a biopsy. Outpatient dermatology follow-up.   Gout: stable, continue home uloric 40 mg daily, hold allopurinol.  Hyperkalemia  resolved  DM-II: Controlled  04/20/2015 hemoglobin A1c = 6.3, On insulin 70/30 at home, since she is on steroids sugars were running high hence sliding scale insulin was added. Will monitor CBGs and adjust.  CBG (last 3)   Recent Labs  05/31/15 0110 05/31/15 0400 05/31/15 0736  GLUCAP 75 104* 82     Code Status: FULL Family Communication:  family present at time of exam Disposition Plan: Home vs SNF  Consultants: Dr.[facility]  Procedure/Significant Events: 12/21  echocardiogram;- Left ventricle: mild concentric hypertrophy. -LVEF= 55% to 60%. - (grade 2 diastolic dysfunction). - Left atrium: moderately dilated. - Pulmonary arteries: PA peak pressure: 31 mm Hg (S). 1/25 CXR;-patchy LLL airspace consolidation/small left pleural effusion, developing pneumonia.? 1/29 - renal vascular ultrasound.  Inconclusive. 05/31/2015. Skin biopsy ordered  Serologies -C3/C4 WNL -Myeloperoxidase antibody/P- ANKA WNL -ANA negative -GBM antibody negative -RPR negative -Anti-scleroderma antibody pending  -anti-double-stranded DNA pending -Sjogrens Ab A/B pending -Anti-RNP pending  -ESR high 97 -  Culture 1/25 blood right hand/forearm NGTD 1/26 urine insignificant growth 1/26 strep pneumo urine antigen negative 1/26 influenza A/B/H1N1 negative 1/28 acute hepatitis panel pending 1/28 HIV 1/2 negative  DVT prophylaxis: Heparin    Continuous Infusions:   Objective: VITAL SIGNS: Temp: 97.5 F (36.4 C) (01/30 0551) Temp Source: Oral (01/30 0551) BP: 95/55 mmHg (01/30 0551) Pulse Rate: 57 (01/30 0551) SPO2; FIO2:   Intake/Output Summary (Last 24 hours) at 05/31/15 0836 Last data filed at 05/31/15 0800  Gross per 24 hour  Intake    920 ml  Output   2200 ml  Net  -1280 ml     Exam: General:  A/O 4, states feels better than at admission., No acute respiratory distress Eyes: Negative headache, double vision,negative scleral hemorrhage ENT: Negative Runny nose, negative gingival bleeding, Neck:  Negative scars, masses, torticollis, lymphadenopathy, JVD Lungs: diffuse poor air movement (significantly improved), negative wheezes, mild LLL crackles Cardiovascular: Regular rate and rhythm without murmur gallop or rub normal S1 and S2 Abdomen: Morbidly obese, negative abdominal pain, nondistended, positive soft, bowel sounds, no rebound, no ascites, no appreciable mass Extremities: No significant cyanosis, clubbing. Bilateral lower extremity pitting  edema 1+ to thighs. RLE eschar calf (per patient secondary to striking her calf with a walker) negative discharge, negative erythema Skin; diffuse macule papular rash on extremities and back, nonblanching, non-pruritic, erythematous. Rash is on palms of hands. Appears to be resolving with decreased erythema on legs and arms Psychiatric:  Negative depression, negative anxiety, negative fatigue, negative mania  Neurologic:  Cranial nerves II through XII intact, tongue/uvula midline, all extremities muscle strength 5/5, sensation intact throughout, negative dysarthria, negative expressive aphasia, negative receptive aphasia.   Data Reviewed: Basic Metabolic Panel:  Recent Labs Lab 05/26/15 2254 05/27/15 1612 05/28/15 0216 05/28/15 1225 05/29/15 0224 05/30/15 0413  NA 139 140 138  --  139 141  K 5.0 5.0 5.3*  --  4.8 4.6  CL 106 111 110  --  108 106  CO2 19* 19* 20*  --  19* 20*  GLUCOSE 155* 71 137*  --  195* 126*  BUN 72* 71* 76*  --  83* 94*  CREATININE 4.00* 3.87* 4.20*  --  4.18* 3.79*  CALCIUM 8.3* 7.7* 7.6*  --  8.0* 8.4*  MG  --   --   --  1.7  --  1.8  PHOS  --   --  5.2*  --  6.1*  --    Liver Function Tests:  Recent Labs Lab 05/26/15 2254 05/28/15 0216 05/29/15 0224 05/30/15 0413  AST 30  --   --  18  ALT 12*  --   --  8*  ALKPHOS 48  --   --  45  BILITOT 0.9  --   --  0.4  PROT 5.6*  --   --  5.8*  ALBUMIN 2.2* 1.8* 1.9* 1.8*   No results for input(s): LIPASE, AMYLASE in the  last 168 hours. No results for input(s): AMMONIA in the last 168 hours. CBC:  Recent Labs Lab 05/26/15 2254 05/28/15 0216 05/29/15 0224 05/30/15 0413 05/31/15 0638  WBC 15.5* 11.8* 8.6 6.5 8.9  NEUTROABS 14.2*  --   --  5.9  --   HGB 9.4* 8.3* 8.9* 9.1* 9.9*  HCT 30.0* 27.7* 29.2* 29.3* 31.0*  MCV 93.5 95.5 92.7 90.2 89.3  PLT 268 248 300 329 358   Cardiac Enzymes:  Recent Labs Lab 05/27/15 0142 05/27/15 0601 05/27/15 1612  TROPONINI 0.06* 0.05* 0.05*   BNP (last 3  results)  Recent Labs  04/23/15 0008 05/27/15 0602  BNP 298.3* 376.7*    ProBNP (last 3 results) No results for input(s): PROBNP in the last 8760 hours.  CBG:  Recent Labs Lab 05/30/15 2043 05/31/15 0037 05/31/15 0110 05/31/15 0400 05/31/15 0736  GLUCAP 80 63* 75 104* 82    Recent Results (from the past 240 hour(s))  Culture, blood (routine x 2)     Status: None (Preliminary result)   Collection Time: 05/26/15 10:52 PM  Result Value Ref Range Status   Specimen Description BLOOD RIGHT HAND  Final   Special Requests BOTTLES DRAWN AEROBIC AND ANAEROBIC 5CC  Final   Culture NO GROWTH 4 DAYS  Final   Report Status PENDING  Incomplete  Culture, blood (routine x 2)     Status: None (Preliminary result)   Collection Time: 05/26/15 10:53 PM  Result Value Ref Range Status   Specimen Description BLOOD RIGHT FOREARM  Final   Special Requests BOTTLES DRAWN AEROBIC AND ANAEROBIC 5CC  Final   Culture NO GROWTH 4 DAYS  Final   Report Status PENDING  Incomplete  Urine culture     Status: None   Collection Time: 05/27/15  6:18 AM  Result Value Ref Range Status   Specimen Description URINE, CATHETERIZED  Final   Special Requests NONE  Final   Culture 2,000 COLONIES/mL INSIGNIFICANT GROWTH  Final   Report Status 05/28/2015 FINAL  Final     Studies:  Recent x-ray studies have been reviewed in detail by the Attending Physician  Scheduled Meds:  Anti-infectives    Start     Dose/Rate Route Frequency Ordered Stop   05/31/15 2300  vancomycin (VANCOCIN) 1,500 mg in sodium chloride 0.9 % 500 mL IVPB     1,500 mg 250 mL/hr over 120 Minutes Intravenous Every 48 hours 05/30/15 1233     05/28/15 2330  vancomycin (VANCOCIN) 1,500 mg in sodium chloride 0.9 % 500 mL IVPB  Status:  Discontinued     1,500 mg 250 mL/hr over 120 Minutes Intravenous Every 48 hours 05/27/15 0059 05/30/15 1233   05/27/15 0800  piperacillin-tazobactam (ZOSYN) IVPB 2.25 g     2.25 g 100 mL/hr over 30 Minutes  Intravenous Every 8 hours 05/27/15 0059     05/26/15 2315  vancomycin (VANCOCIN) 2,000 mg in sodium chloride 0.9 % 500 mL IVPB     2,000 mg 250 mL/hr over 120 Minutes Intravenous  Once 05/26/15 2242 05/27/15 0247   05/26/15 2245  piperacillin-tazobactam (ZOSYN) IVPB 3.375 g     3.375 g 100 mL/hr over 30 Minutes Intravenous  Once 05/26/15 2242 05/26/15 2328      Scheduled Meds: . amLODipine  10 mg Oral Daily  . arformoterol  15 mcg Nebulization BID  . aspirin  81 mg Oral Daily  . carvedilol  25 mg Oral BID WC  . citalopram  20 mg Oral Daily  .  cloNIDine  0.3 mg Oral BID  . dextromethorphan-guaiFENesin  1 tablet Oral BID  . febuxostat  40 mg Oral Daily  . fenofibrate  160 mg Oral Daily  . furosemide  80 mg Oral BID  . heparin  5,000 Units Subcutaneous 3 times per day  . insulin aspart  0-5 Units Subcutaneous QHS  . insulin aspart  0-9 Units Subcutaneous TID WC  . insulin aspart protamine- aspart  55 Units Subcutaneous Q breakfast  . insulin aspart protamine- aspart  55 Units Subcutaneous Q supper  . ipratropium-albuterol  3 mL Nebulization QID  . isosorbide mononitrate  45 mg Oral Daily  . neomycin-bacitracin-polymyxin   Topical Daily  . nystatin cream   Topical BID  . omega-3 acid ethyl esters  1 g Oral Daily  . oxyCODONE-acetaminophen  1 tablet Oral BID   And  . oxyCODONE  5 mg Oral BID  . pantoprazole  40 mg Oral Daily  . piperacillin-tazobactam (ZOSYN)  IV  2.25 g Intravenous Q8H  . predniSONE  60 mg Oral Q breakfast  . rosuvastatin  10 mg Oral Daily  . vancomycin  1,500 mg Intravenous Q48H    Time spent on care of this patient: 40 mins   Signature  Lala Lund K M.D on 05/31/2015 at 8:37 AM  Between 7am to 7pm - Pager - [phone], After 7pm go to [email or web address] - password [facility]  [facility]  - Office  [phone]

## 2015-05-31 NOTE — Progress Notes (Signed)
Pt states she doesn't want to wear cpap tonight, will call if she changes her mind.

## 2015-05-31 NOTE — Care Management Important Message (Signed)
Important Message  Patient Details  Name: Anna [name] MRN: [id number] Date of Birth: March 29, 1935   Medicare Important Message Given:  Yes    Rayhana Slider Abena 05/31/2015, 1:26 PM

## 2015-05-31 NOTE — Procedures (Signed)
Skin biopsy procedure note  Pre-operative Diagnosis: generalized petechial rash  Post-operative Diagnosis: same  Indications: Anna [name] is a 81 year old female with a history of HTN, DM II, CIPD, CKD stage IV, dCHF, symptomatic choledocholithiasis who underwent a laparoscopic cholecystectomy on 05/25/15 by Dr. Ninfa Linden after a hospitalization and ERCP in December 2016. The patient was discharged and the following day developed a generalized rash. She reports improvement in rash since then. It has waxed and waned since January. No pruritis, pain or drainage. No known irritants. She was due to see dermatology, but was rescheduled due to conflict with cholecystectomy.  The patient was admitted on 1/26 with sepsis due to HCAP. Since admission, she has worsening renal function. Nephrology has been following. There is a suspicion for vasculitis and therefore we have been asked to evaluate for a skin biopsy.    Anesthesia: 2% plain lidocaine  Procedure Details  The procedure, risks and complications have been discussed in detail (including, infection, bleeding and poor wound healing) with the patient and daughter Rip Harbour, and the patient has verbally consented to the procedure.  The skin was sterilely prepped and draped over the affected area in the usual fashion. After adequate local anesthesia, I performed a 89mm punch to the left distal arm.  Specimen was placed in formalin.  Hemostasis was achieved by manual pressure.  The skin was thin and I opted not to close the site.  Dry dressing was applied. The patient was observed until stable.  Findings: Specimen taken to pathology by RN Edd Arbour.   EBL: <5 cc's  Drains: none  Condition: Tolerated procedure well and Stable   Complications: none.  Marielena Harvell, ANP-BC

## 2015-05-31 NOTE — Progress Notes (Signed)
Attempted to see pt.  Pt at procedure.  Will attempt back as schedule allows. Jinger Neighbors, [place] [phone]

## 2015-05-31 NOTE — Progress Notes (Signed)
Hypoglycemic Event  CBG: 63  Treatment: 15 GM carbohydrate snack  Symptoms: Pale and Hungry  Follow-up CBG: Time: 0110 CBG Result:75  Possible Reasons for Event: Inadequate meal intake  Comments/MD notified: Rogue Bussing NP paged     Teryl Lucy

## 2015-05-31 NOTE — Consult Note (Signed)
Reason for Consult: rash Referring Physician: Dr. Lala Lund   HPI: Anna [name] is a 81 year old female with a history of HTN, DM II, CIPD, CKD stage IV, dCHF, symptomatic choledocholithiasis who underwent a laparoscopic cholecystectomy on 05/25/15 by Dr. Ninfa Linden after a hospitalization and ERCP in December 2016.  The patient was discharged and the following day developed a generalized rash.  She reports improvement in rash since then.  It has waxed and waned since January.  No pruritis, pain or drainage.  No known irritants. She was due to see dermatology, but was rescheduled due to conflict with cholecystectomy.   The patient was admitted on 1/26 with sepsis due to HCAP.  Since admission, she has worsening renal function.  Nephrology has been following.  There is a suspicion for vasculitis and therefore we have been asked to evaluate for a skin biopsy.  Serologies pending.   Past Medical History  Diagnosis Date  . Hypertension   . COPD (chronic obstructive pulmonary disease) ([place])   . Neuromuscular disorder ([place])   . Depression with anxiety   . Osteopenia   . Arthritis   . GERD (gastroesophageal reflux disease)   . Hyperlipidemia   . Obesity   . Vitamin B12 deficiency   . Chronic kidney disease (CKD), stage IV (severe) ([place])   . Sleep apnea     no cpap use  . Anemia     iron infusion- x2 recent , last 5'16  . Hx of adenomatous colonic polyps 09/2014  . Diabetes mellitus without complication ([place])     type ll   dx about 30 yrs ago.  [name] HNP (herniated nucleus pulposus with myelopathy), thoracic   . Diastolic CHF ([place])   . Pneumonia 05/2015    Past Surgical History  Procedure Laterality Date  . Breast surgery  April 1996    needle biopsy   . Tubal ligation    . Cataract extraction, bilateral Bilateral   . Esophagogastroduodenoscopy N/A 10/19/2014    Procedure: ESOPHAGOGASTRODUODENOSCOPY (EGD);  Surgeon: Gatha Mayer, MD;  Location: [facility];  Service: Endoscopy;   Laterality: N/A;  . Colonoscopy N/A 10/19/2014    Procedure: COLONOSCOPY;  Surgeon: Gatha Mayer, MD;  Location: [facility];  Service: Endoscopy;  Laterality: N/A;  . Ercp N/A 04/22/2015    Procedure: ENDOSCOPIC RETROGRADE CHOLANGIOPANCREATOGRAPHY (ERCP);  Surgeon: Milus Banister, MD;  Location: [facility];  Service: Endoscopy;  Laterality: N/A;  . Laparoscopic cholecystectomy  05/25/2015  . Cholecystectomy N/A 05/25/2015    Procedure: LAPAROSCOPIC CHOLECYSTECTOMY;  Surgeon: Coralie Keens, MD;  Location: [facility];  Service: General;  Laterality: N/A;    Family History  Problem Relation Age of Onset  . Hypertension Mother   . Hypertension Father   . Diabetes Father   . COPD Father   . Heart disease Father   . Diabetes Sister     x 2  . Heart attack Sister     stent  . Heart disease Sister   . Colon cancer Neg Hx   . Colon polyps Neg Hx   . Esophageal cancer Neg Hx   . Skin cancer Sister   . Testicular cancer Brother     Social History:  reports that she has quit smoking. Her smoking use included Cigarettes. She has a 120 pack-year smoking history. She has never used smokeless tobacco. She reports that she does not drink alcohol or use illicit drugs.  Allergies:  Allergies  Allergen Reactions  . Ace Inhibitors Other (  See Comments)    Kidney failure  . Niaspan [Niacin Er] Other (See Comments)    flushing  . Other Rash    Pt started Norvasc, clonidine, and isosorbide on 04-26-15.  Not sure which one is causing the rash.  Pls update after pt goes to MD today    Medications:  Scheduled Meds: . amLODipine  10 mg Oral Daily  . arformoterol  15 mcg Nebulization BID  . aspirin  81 mg Oral Daily  . carvedilol  25 mg Oral BID WC  . citalopram  20 mg Oral Daily  . cloNIDine  0.3 mg Oral BID  . dextromethorphan-guaiFENesin  1 tablet Oral BID  . febuxostat  40 mg Oral Daily  . fenofibrate  160 mg Oral Daily  . furosemide  80 mg Oral BID  . heparin  5,000 Units Subcutaneous  3 times per day  . insulin aspart  0-5 Units Subcutaneous QHS  . insulin aspart  0-9 Units Subcutaneous TID WC  . insulin aspart protamine- aspart  55 Units Subcutaneous Q breakfast  . insulin aspart protamine- aspart  55 Units Subcutaneous Q supper  . ipratropium-albuterol  3 mL Nebulization QID  . isosorbide mononitrate  45 mg Oral Daily  . levofloxacin  500 mg Oral Q48H  . neomycin-bacitracin-polymyxin   Topical Daily  . nystatin cream   Topical BID  . omega-3 acid ethyl esters  1 g Oral Daily  . oxyCODONE-acetaminophen  1 tablet Oral BID   And  . oxyCODONE  5 mg Oral BID  . pantoprazole  40 mg Oral Daily  . predniSONE  60 mg Oral Q breakfast  . rosuvastatin  10 mg Oral Daily   Continuous Infusions:  PRN Meds:.acetaminophen, albuterol, diphenhydrAMINE, hydrALAZINE, lidocaine, oxyCODONE, polyethylene glycol, pramoxine-mineral oil-zinc, witch hazel-glycerin   Results for orders placed or performed during the hospital encounter of 05/26/15 (from the past 48 hour(s))  Glucose, capillary     Status: Abnormal   Collection Time: 05/29/15 12:34 PM  Result Value Ref Range   Glucose-Capillary 198 (H) 65 - 99 mg/dL  RPR     Status: None   Collection Time: 05/29/15  2:37 PM  Result Value Ref Range   RPR Ser Ql Non Reactive Non Reactive    Comment: (NOTE) Performed At: [facility] [place], [place] [id number] Lindon Romp MD [phone]   Rapid HIV screen (HIV 1/2 Ab+Ag) ([place] Only)     Status: None   Collection Time: 05/29/15  2:37 PM  Result Value Ref Range   HIV-1 P24 Antigen - HIV24 NON REACTIVE NON REACTIVE   HIV 1/2 Antibodies NON REACTIVE NON REACTIVE   Interpretation (HIV Ag Ab)      A non reactive test result means that HIV 1 or HIV 2 antibodies and HIV 1 p24 antigen were not detected in the specimen.  Glucose, capillary     Status: Abnormal   Collection Time: 05/29/15  5:21 PM  Result Value Ref Range   Glucose-Capillary 242 (H) 65 - 99 mg/dL   Glucose, capillary     Status: Abnormal   Collection Time: 05/29/15  8:01 PM  Result Value Ref Range   Glucose-Capillary 235 (H) 65 - 99 mg/dL  Glucose, capillary     Status: Abnormal   Collection Time: 05/29/15  9:46 PM  Result Value Ref Range   Glucose-Capillary 176 (H) 65 - 99 mg/dL  Glucose, capillary     Status: Abnormal   Collection Time: 05/30/15 12:07  AM  Result Value Ref Range   Glucose-Capillary 132 (H) 65 - 99 mg/dL  CBC with Differential/Platelet     Status: Abnormal   Collection Time: 05/30/15  4:13 AM  Result Value Ref Range   WBC 6.5 4.0 - 10.5 K/uL   RBC 3.25 (L) 3.87 - 5.11 MIL/uL   Hemoglobin 9.1 (L) 12.0 - 15.0 g/dL   HCT 29.3 (L) 36.0 - 46.0 %   MCV 90.2 78.0 - 100.0 fL   MCH 28.0 26.0 - 34.0 pg   MCHC 31.1 30.0 - 36.0 g/dL   RDW 15.3 11.5 - 15.5 %   Platelets 329 150 - 400 K/uL   Neutrophils Relative % 90 %   Neutro Abs 5.9 1.7 - 7.7 K/uL   Lymphocytes Relative 9 %   Lymphs Abs 0.6 (L) 0.7 - 4.0 K/uL   Monocytes Relative 1 %   Monocytes Absolute 0.1 0.1 - 1.0 K/uL   Eosinophils Relative 0 %   Eosinophils Absolute 0.0 0.0 - 0.7 K/uL   Basophils Relative 0 %   Basophils Absolute 0.0 0.0 - 0.1 K/uL  Comprehensive metabolic panel     Status: Abnormal   Collection Time: 05/30/15  4:13 AM  Result Value Ref Range   Sodium 141 135 - 145 mmol/L   Potassium 4.6 3.5 - 5.1 mmol/L   Chloride 106 101 - 111 mmol/L   CO2 20 (L) 22 - 32 mmol/L   Glucose, Bld 126 (H) 65 - 99 mg/dL   BUN 94 (H) 6 - 20 mg/dL   Creatinine, Ser 3.79 (H) 0.44 - 1.00 mg/dL   Calcium 8.4 (L) 8.9 - 10.3 mg/dL   Total Protein 5.8 (L) 6.5 - 8.1 g/dL   Albumin 1.8 (L) 3.5 - 5.0 g/dL   AST 18 15 - 41 U/L   ALT 8 (L) 14 - 54 U/L   Alkaline Phosphatase 45 38 - 126 U/L   Total Bilirubin 0.4 0.3 - 1.2 mg/dL   GFR calc non Af Amer 10 (L) >60 mL/min   GFR calc Af Amer 12 (L) >60 mL/min    Comment: (NOTE) The eGFR has been calculated using the CKD EPI equation. This calculation has not been  validated in all clinical situations. eGFR's persistently <60 mL/min signify possible Chronic Kidney Disease.    Anion gap 15 5 - 15  Magnesium     Status: None   Collection Time: 05/30/15  4:13 AM  Result Value Ref Range   Magnesium 1.8 1.7 - 2.4 mg/dL  Glucose, capillary     Status: Abnormal   Collection Time: 05/30/15  4:40 AM  Result Value Ref Range   Glucose-Capillary 108 (H) 65 - 99 mg/dL  Glucose, capillary     Status: Abnormal   Collection Time: 05/30/15  8:38 AM  Result Value Ref Range   Glucose-Capillary 126 (H) 65 - 99 mg/dL  Glucose, capillary     Status: None   Collection Time: 05/30/15 12:36 PM  Result Value Ref Range   Glucose-Capillary 92 65 - 99 mg/dL  Glucose, capillary     Status: None   Collection Time: 05/30/15  3:02 PM  Result Value Ref Range   Glucose-Capillary 83 65 - 99 mg/dL  Glucose, capillary     Status: None   Collection Time: 05/30/15  8:43 PM  Result Value Ref Range   Glucose-Capillary 80 65 - 99 mg/dL  Glucose, capillary     Status: Abnormal   Collection Time: 05/31/15 12:37 AM  Result Value Ref Range   Glucose-Capillary 63 (L) 65 - 99 mg/dL  Glucose, capillary     Status: None   Collection Time: 05/31/15  1:10 AM  Result Value Ref Range   Glucose-Capillary 75 65 - 99 mg/dL  Glucose, capillary     Status: Abnormal   Collection Time: 05/31/15  4:00 AM  Result Value Ref Range   Glucose-Capillary 104 (H) 65 - 99 mg/dL  CBC     Status: Abnormal   Collection Time: 05/31/15  6:38 AM  Result Value Ref Range   WBC 8.9 4.0 - 10.5 K/uL   RBC 3.47 (L) 3.87 - 5.11 MIL/uL   Hemoglobin 9.9 (L) 12.0 - 15.0 g/dL   HCT 31.0 (L) 36.0 - 46.0 %   MCV 89.3 78.0 - 100.0 fL   MCH 28.5 26.0 - 34.0 pg   MCHC 31.9 30.0 - 36.0 g/dL   RDW 15.5 11.5 - 15.5 %   Platelets 358 150 - 400 K/uL  Renal function panel     Status: Abnormal   Collection Time: 05/31/15  6:38 AM  Result Value Ref Range   Sodium 141 135 - 145 mmol/L   Potassium 4.1 3.5 - 5.1 mmol/L    Chloride 107 101 - 111 mmol/L   CO2 23 22 - 32 mmol/L   Glucose, Bld 81 65 - 99 mg/dL   BUN 105 (H) 6 - 20 mg/dL   Creatinine, Ser 3.88 (H) 0.44 - 1.00 mg/dL   Calcium 8.3 (L) 8.9 - 10.3 mg/dL   Phosphorus 6.6 (H) 2.5 - 4.6 mg/dL   Albumin 1.9 (L) 3.5 - 5.0 g/dL   GFR calc non Af Amer 10 (L) >60 mL/min   GFR calc Af Amer 12 (L) >60 mL/min    Comment: (NOTE) The eGFR has been calculated using the CKD EPI equation. This calculation has not been validated in all clinical situations. eGFR's persistently <60 mL/min signify possible Chronic Kidney Disease.    Anion gap 11 5 - 15  Glucose, capillary     Status: None   Collection Time: 05/31/15  7:36 AM  Result Value Ref Range   Glucose-Capillary 82 65 - 99 mg/dL    No results found.  Review of Systems  Constitutional: Negative for fever, chills, malaise/fatigue and diaphoresis.  Respiratory: Negative for cough, hemoptysis, sputum production, shortness of breath and wheezing.   Cardiovascular: Positive for leg swelling. Negative for chest pain, palpitations, orthopnea, claudication and PND.  Gastrointestinal: Negative for nausea, vomiting and abdominal pain.  Genitourinary: Negative for dysuria, urgency, frequency, hematuria and flank pain.  Skin: Positive for rash. Negative for itching.  Neurological: Negative for dizziness, tingling, tremors, loss of consciousness and weakness.   Blood pressure 95/55, pulse 57, temperature 97.5 F (36.4 C), temperature source Oral, resp. rate 15, height 5' 4"  (1.626 m), weight 106.8 kg (235 lb 7.2 oz), SpO2 93 %. Physical Exam  Constitutional: She is oriented to person, place, and time. She appears well-developed and well-nourished. No distress.  Cardiovascular: Normal rate, regular rhythm, normal heart sounds and intact distal pulses.   Respiratory: Effort normal and breath sounds normal. No respiratory distress. She has no wheezes. She has no rales. She exhibits no tenderness.  GI: Soft. Bowel  sounds are normal. She exhibits no distension and no mass. There is no tenderness. There is no rebound and no guarding.  Neurological: She is alert and oriented to person, place, and time.  Skin: She is not diaphoretic.  Petechial rash, non  blanchable, most appreciated LUE with erythema and swelling.   Psychiatric: She has a normal mood and affect. Her behavior is normal. Judgment and thought content normal.    Assessment/Plan: Generalized rash concerning for vasculitis Will proceed with skin biopsy today.  Discussed in detail with the patient and her daughter, Anna [name].  Risks include infection, bleeding, poor wound healing.  Verbalizes understanding and wishes to proceed.   Erby Pian ANP-BC Pager [phone] 05/31/2015, 9:30 AM

## 2015-05-31 NOTE — Progress Notes (Signed)
Physical Therapy Treatment Patient Details Name: Anna [name] MRN: [id number] DOB: 1934-06-16 Today's Date: 05/31/2015    History of Present Illness Pt s/p cholecystectomy 05/25/15 with DC home and return same day due to SOB, fever with HCAP. PMHx: HTN, HLD, COPD, GERD, CKD, CHF, anxiety, depression, DM, gout    PT Comments    Pt with 7 family members present during session.  Pt ambulated 20' with RW and min/guard with o2 sats remaining in 90s throughout session.  Family asking about ambulating daily with pt stating MD said pt needed to walk everyday.  Spoke with family and then nurse about pt ambulating with nursing to bathroom or to [facility] to promote mobility.    Follow Up Recommendations  SNF;Supervision for mobility/OOB     Equipment Recommendations  3in1 (PT)    Recommendations for Other Services       Precautions / Restrictions Precautions Precautions: Fall Precaution Comments: watch sats Restrictions Weight Bearing Restrictions: No    Mobility  Bed Mobility               General bed mobility comments: Pt up in recliner upon arrival.  Transfers Overall transfer level: Needs assistance Equipment used: Rolling walker (2 wheeled) Transfers: Sit to/from Stand Sit to Stand: Min guard         General transfer comment: cues for technique and hand placement  Ambulation/Gait Ambulation/Gait assistance: Min guard Ambulation Distance (Feet): 20 Feet Assistive device: Rolling walker (2 wheeled) Gait Pattern/deviations: Decreased step length - right;Decreased step length - left;Trunk flexed Gait velocity: decreased   General Gait Details: Pt able to stay within RW, but with flexed posture. o2 on 3L/min and o2 sats 96% and higher. family members followed wtih recliner.   Stairs            Wheelchair Mobility    Modified Rankin (Stroke Patients Only)       Balance     Sitting balance-Leahy Scale: Good       Standing balance-Leahy Scale: Poor                       Cognition Arousal/Alertness: Awake/alert Behavior During Therapy: WFL for tasks assessed/performed Overall Cognitive Status: Within Functional Limits for tasks assessed                      Exercises      General Comments General comments (skin integrity, edema, etc.): Pt had a busy AM with x-ray, skin biopsy, and bath.      Pertinent Vitals/Pain Pain Assessment: No/denies pain    Home Living                      Prior Function            PT Goals (current goals can now be found in the care plan section) Acute Rehab PT Goals Patient Stated Goal: return home PT Goal Formulation: With patient/family Time For Goal Achievement: 06/11/15 Potential to Achieve Goals: Fair Progress towards PT goals: Progressing toward goals    Frequency  Min 3X/week    PT Plan Current plan remains appropriate    Co-evaluation             End of Session Equipment Utilized During Treatment: Gait belt;Oxygen Activity Tolerance: Patient limited by fatigue Patient left: in chair;with call bell/phone within reach;with chair alarm set;with family/visitor present     Time: [id number] PT Time Calculation (min) (ACUTE ONLY): 19  min  Charges:  $Gait Training: 8-22 mins                    G Codes:      Leslie Langille LUBECK 05/31/2015, 1:42 PM

## 2015-05-31 NOTE — Progress Notes (Signed)
PT Cancellation Note  Patient Details Name: Anna [name] MRN: [id number] DOB: Jan 27, 1935   Cancelled Treatment:    Reason Eval/Treat Not Completed: Patient at procedure or test/unavailable. Will check back as schedule permits.   Nellie Pester LUBECK 05/31/2015, 9:57 AM

## 2015-05-31 NOTE — Progress Notes (Signed)
Patient ID: Anna [name], female   DOB: Aug 13, 1934, 81 y.o.   MRN: [id number]  [facility] Progress Note   Assessment/ Plan:   1. Acute renal failure on chronic kidney disease stage III: (baseline creatinine 1.6-2.0, followed by Dr. Lorrene Reid). Presented with fever/rash and new-onset proteinuria and hematuria status post laparoscopic cholecystectomy with recorded history of exposure to Ancef. Concern raised about possible vasculitis however serologies negative. Started on empiric corticosteroids for possible vasculitis versus AIN-awaiting skin biopsy and will consider kidney biopsy if there is lack of renal improvement. Creatinine is unchanged over the past 24hr and BUN is higher because of prednisone. No indications for dialysis. 2. Healthcare associated pneumonia with left lower lobe infiltrate: Antibiotic therapy now narrowed down to levofloxacin (previously on vancomycin/Zosyn) 3. Acute blood loss anemia status post laparoscopic cholecystectomy: Improved status post blood transfusion, not on ESA at this time. 4. Hypertension: Fairly controlled on multidrug therapy not including RAS blockers-hypotensive earlier this morning.  Subjective:   Reports to be feeling better- rash has migrated from legs to her back per RN at bedside   Objective:   BP 95/55 mmHg  Pulse 57  Temp(Src) 97.5 F (36.4 C) (Oral)  Resp 15  Ht 5\' 4"  (1.626 m)  Wt 106.8 kg (235 lb 7.2 oz)  BMI 40.40 kg/m2  SpO2 93%  Intake/Output Summary (Last 24 hours) at 05/31/15 0923 Last data filed at 05/31/15 0800  Gross per 24 hour  Intake    580 ml  Output   2200 ml  Net  -1620 ml   Weight change: -6.9 kg (-15 lb 3.4 oz)  Physical Exam: Gen: comfortable resting in bed CVS: pulse regular, bradycardic rate, s1 and s2 normal Resp: Decreased breath sounds over bases bilaterally, no rales  [id number], obese, NT Ext: petechial (non-blanching) rash over arms and back--- very rare lesions over legs.    Imaging: No results found.  Labs: BMET  Recent Labs Lab 05/26/15 2254 05/27/15 1612 05/28/15 0216 05/29/15 0224 05/30/15 0413 05/31/15 0638  NA 139 140 138 139 141 141  K 5.0 5.0 5.3* 4.8 4.6 4.1  CL 106 111 110 108 106 107  CO2 19* 19* 20* 19* 20* 23  GLUCOSE 155* 71 137* 195* 126* 81  BUN 72* 71* 76* 83* 94* 105*  CREATININE 4.00* 3.87* 4.20* 4.18* 3.79* 3.88*  CALCIUM 8.3* 7.7* 7.6* 8.0* 8.4* 8.3*  PHOS  --   --  5.2* 6.1*  --  6.6*   CBC  Recent Labs Lab 05/26/15 2254 05/28/15 0216 05/29/15 0224 05/30/15 0413 05/31/15 0638  WBC 15.5* 11.8* 8.6 6.5 8.9  NEUTROABS 14.2*  --   --  5.9  --   HGB 9.4* 8.3* 8.9* 9.1* 9.9*  HCT 30.0* 27.7* 29.2* 29.3* 31.0*  MCV 93.5 95.5 92.7 90.2 89.3  PLT 268 248 300 329 358    Medications:    . amLODipine  10 mg Oral Daily  . arformoterol  15 mcg Nebulization BID  . aspirin  81 mg Oral Daily  . carvedilol  25 mg Oral BID WC  . citalopram  20 mg Oral Daily  . cloNIDine  0.3 mg Oral BID  . dextromethorphan-guaiFENesin  1 tablet Oral BID  . febuxostat  40 mg Oral Daily  . fenofibrate  160 mg Oral Daily  . furosemide  80 mg Oral BID  . heparin  5,000 Units Subcutaneous 3 times per day  . insulin aspart  0-5 Units Subcutaneous QHS  . insulin aspart  0-9 Units Subcutaneous TID WC  . insulin aspart protamine- aspart  55 Units Subcutaneous Q breakfast  . insulin aspart protamine- aspart  55 Units Subcutaneous Q supper  . ipratropium-albuterol  3 mL Nebulization QID  . isosorbide mononitrate  45 mg Oral Daily  . levofloxacin  500 mg Oral Q48H  . neomycin-bacitracin-polymyxin   Topical Daily  . nystatin cream   Topical BID  . omega-3 acid ethyl esters  1 g Oral Daily  . oxyCODONE-acetaminophen  1 tablet Oral BID   And  . oxyCODONE  5 mg Oral BID  . pantoprazole  40 mg Oral Daily  . predniSONE  60 mg Oral Q breakfast  . rosuvastatin  10 mg Oral Daily   Elmarie Shiley, MD 05/31/2015, 9:23 AM

## 2015-05-31 NOTE — Progress Notes (Signed)
Inpatient Diabetes Program Recommendations  AACE/ADA: New Consensus Statement on Inpatient Glycemic Control (2015)  Target Ranges:  Prepandial:   less than 140 mg/dL      Peak postprandial:   less than 180 mg/dL (1-2 hours)      Critically ill patients:  140 - 180 mg/dL  Results for Anna [name], Anna [name] (MRN [id number]) as of 05/31/2015 09:18  Ref. Range 05/30/2015 00:07 05/30/2015 04:40 05/30/2015 08:38 05/30/2015 12:36 05/30/2015 15:02 05/30/2015 20:43 05/31/2015 00:37 05/31/2015 01:10 05/31/2015 04:00 05/31/2015 07:36  Glucose-Capillary Latest Ref Range: 65-99 mg/dL 132 (H) 108 (H) 126 (H) 92 83 80 63 (L) 75 104 (H) 82   Review of Glycemic Control   Current orders for Inpatient glycemic control: Novolog 70/30 55 units BID (with breakfast and supper), Novolog 0-9 units TID with meals, Novolog 0-5 units HS  Inpatient Diabetes Program Recommendations: Insulin - Basal: Patient received 70/30 55 units at 9:00 am on 05/30/15 and was NOT GIVEN any 70/30 in the evening on 05/30/15. Glucose down to 63 mg/dl on 05/31/15 at 00:37 and most current CBG was 82 mg/dl at 7:36 am today.  Also noted that steroids have been decreased and changed to PO.  Recommend decreasing 70/30  to 25 units BID (with breakfast and supper).  Thanks, Barnie Alderman, RN, MSN, CDE Diabetes Coordinator Inpatient Diabetes Program [phone] (Team Pager from [place] to [place]) [phone] ([facility] office) [phone] [facility] office) [phone] [facility] office)'

## 2015-06-01 LAB — RENAL FUNCTION PANEL
ALBUMIN: 2 g/dL — AB (ref 3.5–5.0)
Anion gap: 13 (ref 5–15)
BUN: 118 mg/dL — AB (ref 6–20)
CO2: 23 mmol/L (ref 22–32)
CREATININE: 3.76 mg/dL — AB (ref 0.44–1.00)
Calcium: 8.5 mg/dL — ABNORMAL LOW (ref 8.9–10.3)
Chloride: 104 mmol/L (ref 101–111)
GFR calc non Af Amer: 11 mL/min — ABNORMAL LOW (ref >60)
GFR, EST AFRICAN AMERICAN: 12 mL/min — AB (ref >60)
Glucose, Bld: 120 mg/dL — ABNORMAL HIGH (ref 65–99)
POTASSIUM: 3.8 mmol/L (ref 3.5–5.1)
Phosphorus: 7.2 mg/dL — ABNORMAL HIGH (ref 2.5–4.6)
Sodium: 140 mmol/L (ref 135–145)

## 2015-06-01 LAB — MISC LABCORP TEST (SEND OUT): Labcorp test code: 6338

## 2015-06-01 LAB — GLUCOSE, CAPILLARY
GLUCOSE-CAPILLARY: 128 mg/dL — AB (ref 65–99)
Glucose-Capillary: 220 mg/dL — ABNORMAL HIGH (ref 65–99)
Glucose-Capillary: 222 mg/dL — ABNORMAL HIGH (ref 65–99)
Glucose-Capillary: 146 mg/dL — ABNORMAL HIGH (ref 65–99)

## 2015-06-01 MED ORDER — HYDRALAZINE HCL 25 MG PO TABS
25.0000 mg | ORAL_TABLET | Freq: Two times a day (BID) | ORAL | Status: DC
  Administered 2015-06-01: 25 mg via ORAL
  Filled 2015-06-01: qty 1

## 2015-06-01 MED ORDER — DOXYLAMINE SUCCINATE (SLEEP) 25 MG PO TABS
25.0000 mg | ORAL_TABLET | Freq: Every evening | ORAL | Status: DC | PRN
  Filled 2015-06-01: qty 1

## 2015-06-01 MED ORDER — HYDRALAZINE HCL 50 MG PO TABS
50.0000 mg | ORAL_TABLET | Freq: Three times a day (TID) | ORAL | Status: DC
  Administered 2015-06-01 – 2015-06-04 (×7): 50 mg via ORAL
  Filled 2015-06-01 (×8): qty 1

## 2015-06-01 NOTE — Progress Notes (Signed)
Physical Therapy Treatment Patient Details Name: Anna [name] MRN: [id number] DOB: 10-27-1934 Today's Date: 06/01/2015    History of Present Illness Pt s/p cholecystectomy 05/25/15 with DC home and return same day due to SOB, fever with HCAP. PMHx: HTN, HLD, COPD, GERD, CKD, CHF, anxiety, depression, DM, gout    PT Comments    Patient able to increase ambulation distance, min guard assistance. SpO2 97% after ambulation. Patient is limited by fatigue, attempting to progress as tolerated.   Follow Up Recommendations  SNF;Supervision for mobility/OOB     Equipment Recommendations  3in1 (PT)    Recommendations for Other Services       Precautions / Restrictions Precautions Precautions: Fall Precaution Comments: watch sats Restrictions Weight Bearing Restrictions: No    Mobility  Bed Mobility               General bed mobility comments: Pt up in recliner upon arrival.  Transfers Overall transfer level: Needs assistance Equipment used: Rolling walker (2 wheeled) Transfers: Sit to/from Stand Sit to Stand: Min guard         General transfer comment: cues for technique and hand placement  Ambulation/Gait Ambulation/Gait assistance: Min guard Ambulation Distance (Feet): 25 Feet Assistive device: Rolling walker (2 wheeled) Gait Pattern/deviations: Decreased step length - right;Decreased step length - left;Step-through pattern;Trunk flexed Gait velocity: decreased   General Gait Details: Cues for posture, 1 standing rest. Using 3L O2.    Stairs            Wheelchair Mobility    Modified Rankin (Stroke Patients Only)       Balance Overall balance assessment: Needs assistance Sitting-balance support: No upper extremity supported Sitting balance-Leahy Scale: Good     Standing balance support: Bilateral upper extremity supported Standing balance-Leahy Scale: Poor Standing balance comment: using rw                    Cognition  Arousal/Alertness: Awake/alert Behavior During Therapy: WFL for tasks assessed/performed Overall Cognitive Status: Within Functional Limits for tasks assessed                      Exercises      General Comments General comments (skin integrity, edema, etc.): SpO2 97-98% sitting, 97% after ambulation.      Pertinent Vitals/Pain Pain Assessment: No/denies pain    Home Living                      Prior Function            PT Goals (current goals can now be found in the care plan section) Acute Rehab PT Goals Patient Stated Goal: return home PT Goal Formulation: With patient/family Time For Goal Achievement: 06/11/15 Potential to Achieve Goals: Fair Progress towards PT goals: Progressing toward goals    Frequency  Min 3X/week    PT Plan Current plan remains appropriate    Co-evaluation             End of Session Equipment Utilized During Treatment: Gait belt;Oxygen Activity Tolerance: Patient limited by fatigue Patient left: in chair;with call bell/phone within reach;with chair alarm set;with family/visitor present     Time: [id number] PT Time Calculation (min) (ACUTE ONLY): 20 min  Charges:  $Gait Training: 8-22 mins                    G Codes:      Cassell Clement, PT, CSCS Pager [phone]  [phone] Office [phone]  06/01/2015, 11:55 AM

## 2015-06-01 NOTE — NC FL2 (Signed)
[place] MEDICAID FL2 LEVEL OF CARE SCREENING TOOL     IDENTIFICATION  Patient Name: Anna [name] Birthdate: 11/22/1934 Sex: female Admission Date (Current Location): 05/26/2015  [facility] and [place] Number:  [employer] and Address:  [facility] [place] [place] [address] [place], [place] [address]      Provider Number: [id number]  Attending Physician Name and Address:  Thurnell Lose, MD  Relative Name and Phone Number:  Roselie Awkward, Daughter, [phone]    Current Level of Care: Hospital Recommended Level of Care: [place] Prior Approval Number:    Date Approved/Denied:   PASRR Number: [id number]  Discharge Plan: Home    Current Diagnoses: Patient Active Problem List   Diagnosis Date Noted  . MCTD (mixed connective tissue disease) ([place])   . Vasculitis of skin   . Depression   . Anxiety state   . Acute kidney injury superimposed on chronic kidney disease ([place])   . Chronic diastolic CHF (congestive heart failure) ([place])   . Chronic obstructive pulmonary disease ([place])   . Sepsis due to pneumonia ([place])   . Chronic diastolic congestive heart failure ([place])   . Diabetes mellitus, insulin dependent (IDDM), controlled ([place])   . Maculopapular rash, generalized   . Hyperkalemia   . HCAP (healthcare-associated pneumonia) 05/27/2015  . Sepsis ([place]) 05/27/2015  . Rash 05/27/2015  . Depression with anxiety 05/27/2015  . Gout 05/27/2015  . Pneumonia 05/27/2015  . Diastolic CHF ([place])   . Symptomatic cholelithiasis 05/25/2015  . Preoperative cardiovascular examination 04/23/2015  . Chest pain 04/23/2015  . DOE (dyspnea on exertion) 04/23/2015  . Calculus of bile duct with obstruction and without cholangitis or cholecystitis   . Pre-op evaluation   . Acute biliary pancreatitis 04/20/2015  . Elevated lactic acid level 04/20/2015  . OSA (obstructive sleep apnea) 04/20/2015  . Acute renal failure superimposed on stage 4 chronic  kidney disease ([place]) 04/20/2015  . Anemia, iron deficiency   . Hx of adenomatous colonic polyps   . Peripheral edema 08/21/2014  . Chronic pain syndrome 08/21/2014  . GERD (gastroesophageal reflux disease) 11/10/2013  . Diabetes mellitus, insulin dependent (IDDM), uncontrolled ([place]) 07/16/2010  . Hypertension 07/16/2010  . Hyperlipidemia 07/16/2010  . COPD (chronic obstructive pulmonary disease) ([place]) 07/16/2010  . Osteoarthritis 07/16/2010  . Diabetic neuropathy ([place]) 07/16/2010  . RLS (restless legs syndrome) 07/16/2010  . Obesity 07/16/2010  . VITAMIN B12 DEFICIENCY 10/08/2009    Orientation RESPIRATION BLADDER Height & Weight     Self, Time, Situation, Place  O2 (Nasal Cannula 2L) Continent, Indwelling catheter (Urinary catheter) Weight: 241 lb 13.5 oz (109.7 kg) Height:  5\' 4"  (162.6 cm)  BEHAVIORAL SYMPTOMS/MOOD NEUROLOGICAL BOWEL NUTRITION STATUS   (N/A)   Incontinent  (Please see DC summary)  AMBULATORY STATUS COMMUNICATION OF NEEDS Skin   Extensive Assist Verbally Surgical wounds (Incision on leg and arm puncture;)                       Personal Care Assistance Level of Assistance  Bathing, Feeding, Dressing Bathing Assistance: Limited assistance Feeding assistance: Independent Dressing Assistance: Limited assistance     Functional Limitations Info             SPECIAL CARE FACTORS FREQUENCY  PT (By licensed PT)     PT Frequency: min 3x/week              Contractures      Additional Factors Info  Code Status, Allergies, Insulin Sliding Scale Code Status Info: DNR Allergies Info: Ace Inhibitors, Niaspan, Other   Insulin Sliding Scale Info: insulin aspart (novoLOG) injection 0-5 Units; insulin aspart (novoLOG) injection 0-9 Units; insulin aspart protamine- aspart (NOVOLOG MIX 70/30) injection 55 Units; insulin aspart protamine- aspart (NOVOLOG MIX 70/30) injection 55 Units       Current Medications (06/01/2015):  This is the current hospital  active medication list Current Facility-Administered Medications  Medication Dose Route Frequency Provider Last Rate Last Dose  . acetaminophen (TYLENOL) tablet 650 mg  650 mg Oral Q4H PRN Rhetta Mura Schorr, NP   650 mg at 05/31/15 0013  . albuterol (PROVENTIL) (2.5 MG/3ML) 0.083% nebulizer solution 2.5 mg  2.5 mg Nebulization Q6H PRN Allie Bossier, MD      . amLODipine (NORVASC) tablet 10 mg  10 mg Oral Daily Fleet Contras, MD   10 mg at 06/01/15 1021  . arformoterol (BROVANA) nebulizer solution 15 mcg  15 mcg Nebulization BID Allie Bossier, MD   15 mcg at 06/01/15 [phone]  . aspirin chewable tablet 81 mg  81 mg Oral Daily Ivor Costa, MD   81 mg at 06/01/15 1024  . carvedilol (COREG) tablet 25 mg  25 mg Oral BID WC Ivor Costa, MD   25 mg at 06/01/15 1022  . citalopram (CELEXA) tablet 20 mg  20 mg Oral Daily Ivor Costa, MD   20 mg at 06/01/15 1025  . cloNIDine (CATAPRES) tablet 0.3 mg  0.3 mg Oral BID Allie Bossier, MD   0.3 mg at 06/01/15 1022  . dextromethorphan-guaiFENesin (MUCINEX DM) 30-600 MG per 12 hr tablet 1 tablet  1 tablet Oral BID Allie Bossier, MD   1 tablet at 06/01/15 1025  . diphenhydrAMINE (BENADRYL) injection 12.5 mg  12.5 mg Intravenous Q6H PRN Dianne Dun, NP   12.5 mg at 05/29/15 0037  . febuxostat (ULORIC) tablet 40 mg  40 mg Oral Daily Ivor Costa, MD   40 mg at 06/01/15 1025  . fenofibrate tablet 160 mg  160 mg Oral Daily Ivor Costa, MD   160 mg at 06/01/15 1025  . furosemide (LASIX) tablet 80 mg  80 mg Oral BID Fleet Contras, MD   80 mg at 06/01/15 1021  . heparin injection 5,000 Units  5,000 Units Subcutaneous 3 times per day Ivor Costa, MD   5,000 Units at 06/01/15 0600  . hydrALAZINE (APRESOLINE) injection 5 mg  5 mg Intravenous Q2H PRN Ivor Costa, MD      . hydrALAZINE (APRESOLINE) tablet 25 mg  25 mg Oral BID Elmarie Shiley, MD   25 mg at 06/01/15 1022  . insulin aspart (novoLOG) injection 0-5 Units  0-5 Units Subcutaneous QHS Thurnell Lose, MD   0 Units  at 05/31/15 2334  . insulin aspart (novoLOG) injection 0-9 Units  0-9 Units Subcutaneous TID WC Thurnell Lose, MD   2 Units at 05/31/15 1408  . insulin aspart protamine- aspart (NOVOLOG MIX 70/30) injection 55 Units  55 Units Subcutaneous Q breakfast Ivor Costa, MD   55 Units at 05/30/15 0900  . insulin aspart protamine- aspart (NOVOLOG MIX 70/30) injection 55 Units  55 Units Subcutaneous Q supper Thurnell Lose, MD   55 Units at 05/31/15 1735  . ipratropium-albuterol (DUONEB) 0.5-2.5 (3) MG/3ML nebulizer solution 3 mL  3 mL Nebulization TID Thurnell Lose, MD   3 mL at 06/01/15 0918  . isosorbide mononitrate (IMDUR) 24 hr tablet 45 mg  45 mg Oral Daily Allie Bossier, MD   45 mg at 05/31/15 1735  . lidocaine (XYLOCAINE) 2 % injection 10 mL  10 mL Infiltration Once PRN Emina Riebock, NP      . neomycin-bacitracin-polymyxin (NEOSPORIN) ointment   Topical Daily Geradine Girt, DO      . nystatin cream (MYCOSTATIN)   Topical BID Ivor Costa, MD      . omega-3 acid ethyl esters (LOVAZA) capsule 1 g  1 g Oral Daily Ivor Costa, MD   1 g at 06/01/15 1025  . oxyCODONE-acetaminophen (PERCOCET/ROXICET) 5-325 MG per tablet 1 tablet  1 tablet Oral BID Ivor Costa, MD   1 tablet at 05/31/15 2154   And  . oxyCODONE (Oxy IR/ROXICODONE) immediate release tablet 5 mg  5 mg Oral BID Ivor Costa, MD   5 mg at 05/31/15 2155  . oxyCODONE (Oxy IR/ROXICODONE) immediate release tablet 5-10 mg  5-10 mg Oral Q4H PRN Ivor Costa, MD   5 mg at 06/01/15 1026  . pantoprazole (PROTONIX) EC tablet 40 mg  40 mg Oral Daily Ivor Costa, MD   40 mg at 06/01/15 1022  . polyethylene glycol (MIRALAX / GLYCOLAX) packet 17 g  17 g Oral Daily PRN Ivor Costa, MD      . pramoxine-mineral oil-zinc (TUCKS) rectal ointment   Rectal TID PRN Allie Bossier, MD      . predniSONE (DELTASONE) tablet 60 mg  60 mg Oral Q breakfast Allie Bossier, MD   60 mg at 06/01/15 0659  . rosuvastatin (CRESTOR) tablet 10 mg  10 mg Oral Daily Ivor Costa, MD   10 mg at  06/01/15 1024  . witch hazel-glycerin (TUCKS) pad   Topical PRN Allie Bossier, MD   1 application at [id number]     Discharge Medications: Please see discharge summary for a list of discharge medications.  Relevant Imaging Results:  Relevant Lab Results:   Additional Information SSN: [place], [place]

## 2015-06-01 NOTE — Clinical Social Work Note (Signed)
Clinical Social Work Assessment  Patient Details  Name: Anna [name] MRN: [id number] Date of Birth: 22-Apr-1935  Date of referral:  06/01/15               Reason for consult:  Facility Placement                Permission sought to share information with:  Facility Sport and exercise psychologist, Family Supports Permission granted to share information::  Yes, Verbal Permission Granted  Name::     Rip Harbour  Agency::  [facility]  Relationship::  Daughter  Contact Information:  [phone]  Housing/Transportation Living arrangements for the past 2 months:  [place] of Information:  Patient, Adult Children Patient Interpreter Needed:  None Criminal Activity/Legal Involvement Pertinent to Current Situation/Hospitalization:  No - Comment as needed Significant Relationships:  Adult Children Lives with:  Self Do you feel safe going back to the place where you live?  No Need for family participation in patient care:  Yes (Comment)  Care giving concerns:  CSW received referral for possible SNF placement at time of discharge. CSW met with patient and patient's three adult children at bedside regarding PT recommendation of SNF placement at time of discharge. Per patient's daughter, patient is currently unable to care for herself at home alone given patient's current physical needs and fall risk. Patient's family works during the day. Patient and patient's daughter expressed understanding of PT recommendation and are agreeable to SNF placement at time of discharge. CSW to continue to follow and assist with discharge planning needs.   Social Worker assessment / plan:  CSW spoke with patient and patient's adult children concerning possibility of rehab at [facility] before returning home.  Employment status:  Retired Nurse, adult PT Recommendations:  [place] / Referral to community resources:  [place]  Patient/Family's Response to care:  Patient and patient's adult children recognize need for rehab before returning home and are agreeable to a SNF in [place]. Patient reported preference for [facility].   Patient/Family's Understanding of and Emotional Response to Diagnosis, Current Treatment, and Prognosis:  Patient is realistic regarding therapy needs. No questions/concerns about plan or treatment.    Emotional Assessment Appearance:  Appears stated age Attitude/Demeanor/Rapport:   (Appropriate) Affect (typically observed):  Accepting, Appropriate, Quiet Orientation:  Oriented to Self, Oriented to Place, Oriented to  Time, Oriented to Situation Alcohol / Substance use:  Not Applicable Psych involvement (Current and /or in the community):  No (Comment)  Discharge Needs  Concerns to be addressed:  Care Coordination Readmission within the last 30 days:  Yes Current discharge risk:  None Barriers to Discharge:  Continued Medical Work up   [employer], [place] 06/01/2015, 12:59 PM

## 2015-06-01 NOTE — Evaluation (Signed)
Occupational Therapy Evaluation Patient Details Name: Anna [name] MRN: [id number] DOB: May 05, 1934 Today's Date: 06/01/2015    History of Present Illness Pt s/p cholecystectomy 05/25/15 with DC home and return same day due to SOB, fever with HCAP. PMHx: HTN, HLD, COPD, GERD, CKD, CHF, anxiety, depression, DM, gout   Clinical Impression   Pt is assisted for all IADL and bathing and dressing at baseline and has a daily aide.  She presents with generalized weakness, decreased activity tolerance, impaired balance and edematous L UE. Pt currently requiring 2L 02 to maintain sats at 97%. Will follow acutely.      Follow Up Recommendations  SNF    Equipment Recommendations       Recommendations for Other Services       Precautions / Restrictions Precautions Precautions: Fall Precaution Comments: watch sats Restrictions Weight Bearing Restrictions: No      Mobility Bed Mobility               General bed mobility comments: Pt up in recliner upon arrival.  Transfers Overall transfer level: Needs assistance Equipment used: Rolling walker (2 wheeled) Transfers: Sit to/from Stand Sit to Stand: Min guard         General transfer comment: cues for technique and hand placement    Balance Overall balance assessment: Needs assistance Sitting-balance support: No upper extremity supported Sitting balance-Leahy Scale: Good     Standing balance support: Bilateral upper extremity supported Standing balance-Leahy Scale: Poor Standing balance comment: using rw                            ADL Overall ADL's : Needs assistance/impaired Eating/Feeding: Independent;Sitting   Grooming: Wash/dry hands;Min guard;Standing   Upper Body Bathing: Minimal assitance;Sitting   Lower Body Bathing: Maximal assistance;Sit to/from stand   Upper Body Dressing : Minimal assistance;Sitting   Lower Body Dressing: Maximal assistance;Sit to/from stand Lower Body Dressing  Details (indicate cue type and reason): pt typically wears slip on houseshoes and avoids socks Toilet Transfer: Min guard;Ambulation;BSC;RW   Toileting- Water quality scientist and Hygiene: Min guard;Sit to/from stand       Functional mobility during ADLs: Herbalist     Praxis      Pertinent Vitals/Pain Pain Assessment: No/denies pain     Hand Dominance Right   Extremity/Trunk Assessment Upper Extremity Assessment Upper Extremity Assessment: Generalized weakness LUE Deficits / Details: moderate edema   Lower Extremity Assessment Lower Extremity Assessment: Defer to PT evaluation   Cervical / Trunk Assessment Cervical / Trunk Assessment: Kyphotic   Communication Communication Communication: No difficulties   Cognition Arousal/Alertness: Awake/alert Behavior During Therapy: WFL for tasks assessed/performed Overall Cognitive Status: Within Functional Limits for tasks assessed                     General Comments       Exercises       Shoulder Instructions      Home Living Family/patient expects to be discharged to:: Private residence Living Arrangements: Alone Available Help at Discharge: Family;Available 24 hours/day;Personal care attendant Type of Home: House Home Access: Stairs to enter [employer] of Steps: 2   Home Layout: One level               Home Equipment: Walker - 2 wheels;Electric scooter          Prior Functioning/Environment Level  of Independence: Needs assistance  Gait / Transfers Assistance Needed: walks household distances with RW; uses "hoveround" if she wants to go outside; uses electric cart at grocery store (although children often shop for her) ADL's / Homemaking Assistance Needed: aide 7 days a week for 2 hours for bathing and dressing, cooking, housekeeping, pt self feeds, toilets and walks short distances with RW        OT Diagnosis: Generalized weakness    OT Problem List: Decreased strength;Decreased activity tolerance;Impaired balance (sitting and/or standing);Obesity;Cardiopulmonary status limiting activity;Increased edema   OT Treatment/Interventions: Self-care/ADL training;DME and/or AE instruction;Therapeutic exercise;Patient/family education    OT Goals(Current goals can be found in the care plan section) Acute Rehab OT Goals Patient Stated Goal: "I guess I'm going to rehab" OT Goal Formulation: With patient Time For Goal Achievement: 06/08/15 Potential to Achieve Goals: Good ADL Goals Pt Will Perform Grooming: with supervision;standing (at least 2 activities) Pt Will Transfer to Toilet: with supervision;ambulating;bedside commode Pt Will Perform Toileting - Clothing Manipulation and hygiene: with supervision;sit to/from stand Pt/caregiver will Perform Home Exercise Program: Increased strength;Both right and left upper extremity;Independently (AROM progressing to resistance) Additional ADL Goal #1: Pt/family will demonstrate edema management techniques for L UE. Additional ADL Goal #2: Pt will utilize energy conservation strategies and breathing techniques during ADL and mobility.  OT Frequency: Min 2X/week   Barriers to D/C:            Co-evaluation              End of Session Equipment Utilized During Treatment: Oxygen;Rolling walker;Gait belt  Activity Tolerance: Patient tolerated treatment well Patient left: in chair;with call bell/phone within reach;with chair alarm set;with family/visitor present   Time: 1151-1210 OT Time Calculation (min): 19 min Charges:  OT General Charges $OT Visit: 1 Procedure OT Evaluation $OT Eval Moderate Complexity: 1 Procedure OT Treatments $Self Care/Home Management : 8-22 mins G-Codes:    Malka So 06/01/2015, 12:20 PM  [phone]

## 2015-06-01 NOTE — Progress Notes (Signed)
Pt refusing cpap for the night, encouraged her to call if she changes her mind.

## 2015-06-01 NOTE — Progress Notes (Signed)
Patient ID: Anna [name], female   DOB: 1934-12-16, 81 y.o.   MRN: [id number]   LOS: 5 days   Subjective: Site of bx w/o c/o   Objective: Vital signs in last 24 hours: Temp:  [97.6 F (36.4 C)-98.4 F (36.9 C)] 97.6 F (36.4 C) (01/31 0558) Pulse Rate:  [59-62] 62 (01/31 0558) Resp:  [18-20] 20 (01/31 0558) BP: (153-173)/(42-68) 173/68 mmHg (01/31 0558) SpO2:  [94 %-99 %] 98 % (01/31 0919) Weight:  [109.7 kg (241 lb 13.5 oz)] 109.7 kg (241 lb 13.5 oz) (01/31 0500) Last BM Date: 05/28/15   Physical Exam Extremities: LUE bx site WNL   Assessment/Plan: Rash -- Bx site WNL. Surgery will sign off. Please call with questions.    Lisette Abu, PA-C Pager: [phone] 06/01/2015

## 2015-06-01 NOTE — Progress Notes (Signed)
Patient ID: Anna [name], female   DOB: 05-25-1934, 81 y.o.   MRN: [id number]  [facility] Progress Note   Assessment/ Plan:   1. Acute renal failure on chronic kidney disease stage III: (baseline creatinine 1.6-2.0, followed by Dr. Lorrene Reid). Presented with fever/rash and new-onset proteinuria and hematuria status post laparoscopic cholecystectomy with recorded history of exposure to Ancef. Serologies for vasculitis are negative. Suspicion raised of drug associated skin eruption with concomitant AIN for which she was started on corticosteroids. Awaiting labs from today-if creatinine not clearly improving, will need kidney biopsy (this will not change corticosteroid therapy however will simply determine the duration of corticosteroid therapy). 2. Healthcare associated pneumonia with left lower lobe infiltrate: Antibiotic therapy now narrowed down to levofloxacin (previously on vancomycin/Zosyn) 3. Acute blood loss anemia status post laparoscopic cholecystectomy: Improved status post blood transfusion, not on ESA at this time. 4. Hypertension: Her pressures intermittently elevated on a multidrug regimen-will add hydralazine as she is already on isosorbide to help mimic ACE inhibitor effect  Subjective:   Reports to have slept poorly overnight for unclear reasons. Denies any chest pain or shortness of breath.    Objective:   BP 173/68 mmHg  Pulse 62  Temp(Src) 97.6 F (36.4 C) (Oral)  Resp 20  Ht 5\' 4"  (1.626 m)  Wt 109.7 kg (241 lb 13.5 oz)  BMI 41.49 kg/m2  SpO2 98%  Intake/Output Summary (Last 24 hours) at 06/01/15 0941 Last data filed at 06/01/15 [id number]  Gross per 24 hour  Intake      0 ml  Output   1550 ml  Net  -1550 ml   Weight change: 2.9 kg (6 lb 6.3 oz)  Physical Exam: Gen: comfortable resting in bed family member at bedside CVS: pulse regular, bradycardic rate, s1 and s2 normal Resp: Decreased breath sounds over bases bilaterally, no rales  [id number], obese,  NT Ext: petechial (non-blanching) rash over arms and back--- very rare lesions over legs.   Imaging: Dg Chest 2 View  05/31/2015  CLINICAL DATA:  Shortness of Breath EXAM: CHEST  2 VIEW COMPARISON:  05/26/2015 FINDINGS: Cardiomediastinal silhouette is stable. Persistent left small pleural effusion with left basilar atelectasis or infiltrate. New linear atelectasis or early infiltrate in lingula. Right lung is clear. No pulmonary edema. IMPRESSION: Persistent left small pleural effusion with left basilar atelectasis or infiltrate. New linear atelectasis or early infiltrate in lingula. Right lung is clear. Electronically Signed   By: Lahoma Crocker M.D.   On: 05/31/2015 09:55    Labs: BMET  Recent Labs Lab 05/26/15 2254 05/27/15 1612 05/28/15 0216 05/29/15 0224 05/30/15 0413 05/31/15 0638  NA 139 140 138 139 141 141  K 5.0 5.0 5.3* 4.8 4.6 4.1  CL 106 111 110 108 106 107  CO2 19* 19* 20* 19* 20* 23  GLUCOSE 155* 71 137* 195* 126* 81  BUN 72* 71* 76* 83* 94* 105*  CREATININE 4.00* 3.87* 4.20* 4.18* 3.79* 3.88*  CALCIUM 8.3* 7.7* 7.6* 8.0* 8.4* 8.3*  PHOS  --   --  5.2* 6.1*  --  6.6*   CBC  Recent Labs Lab 05/26/15 2254 05/28/15 0216 05/29/15 0224 05/30/15 0413 05/31/15 0638  WBC 15.5* 11.8* 8.6 6.5 8.9  NEUTROABS 14.2*  --   --  5.9  --   HGB 9.4* 8.3* 8.9* 9.1* 9.9*  HCT 30.0* 27.7* 29.2* 29.3* 31.0*  MCV 93.5 95.5 92.7 90.2 89.3  PLT 268 248 300 329 358    Medications:    .  amLODipine  10 mg Oral Daily  . arformoterol  15 mcg Nebulization BID  . aspirin  81 mg Oral Daily  . carvedilol  25 mg Oral BID WC  . citalopram  20 mg Oral Daily  . cloNIDine  0.3 mg Oral BID  . dextromethorphan-guaiFENesin  1 tablet Oral BID  . febuxostat  40 mg Oral Daily  . fenofibrate  160 mg Oral Daily  . furosemide  80 mg Oral BID  . heparin  5,000 Units Subcutaneous 3 times per day  . insulin aspart  0-5 Units Subcutaneous QHS  . insulin aspart  0-9 Units Subcutaneous TID WC  .  insulin aspart protamine- aspart  55 Units Subcutaneous Q breakfast  . insulin aspart protamine- aspart  55 Units Subcutaneous Q supper  . ipratropium-albuterol  3 mL Nebulization TID  . isosorbide mononitrate  45 mg Oral Daily  . neomycin-bacitracin-polymyxin   Topical Daily  . nystatin cream   Topical BID  . omega-3 acid ethyl esters  1 g Oral Daily  . oxyCODONE-acetaminophen  1 tablet Oral BID   And  . oxyCODONE  5 mg Oral BID  . pantoprazole  40 mg Oral Daily  . predniSONE  60 mg Oral Q breakfast  . rosuvastatin  10 mg Oral Daily   Elmarie Shiley, MD 06/01/2015, 9:41 AM

## 2015-06-01 NOTE — Progress Notes (Signed)
Progress Note  Anna [name] BCW:[id number] DOB: 1934-08-02 DOA: 05/26/2015 PCP: Chevis Pretty, FNP  Admit HPI / Brief Narrative:  81 y.o. WF PMHx Depression, Anxiety, HTN, Chronic Diastolic CHF, HLD, DM Type 2 , COPD, GERD, Gout, symptomatic  Cholelithiasis S/P  Laparoscopic Cholecystectomy on 05/25/15, Obesity,  CKD stage IV, Anemia, Who presents with fever, cough, shortness of breath.  Pt was hospitalized from 1/23 to 05/26/15 due to symptomatic cholelithiasis. She had laparoscopic cholecystectomy by dr. Ninfa Linden on 05/25/15. She was just discharged home in the morning. She reports that she started having dry cough, fever, chills, shortness of breath after she went home. She also has mild chest pain acrossing front the chest. She states that she has mild pain over surgical sites, but no abdominal pain, diarrhea, symptoms of UTI. Patient has rashes over both lower legs which started on 05/02/15. She does not have unilateral weakness, hematuria, hematochezia, nausea, vomiting.  In ED, patient was found to have WBC 15.5, temperature 101.5, tachycardia, tachypnea, worsening renal function with cre 4.00, lactate 0.92. Chest x-ray showed left lower lobe infiltration with small amount of effusion. She was also found to have a macular rash, she was seen by nephrology, also underwent skin biopsy by general surgery on 05/31/2015. She was placed on oral steroids, transferred under my Care on 05/31/2015. Renal still thinking about a kidney biopsy if her renal function is not significantly improved.   HPI/Subjective:  Patient in bed appears comfortable, denies any fever or chills, no headache, no chest or abdominal pain. No cough.   Assessment/Plan:  Sepsis due to HCAP:  Initially had sepsis, so far all cultures negative, was on vancomycin and Zosyn and will taper to oral Levaquin on 05/31/2015 dosed by pharmacy based on her renal function. Supportive care continued. She had been placed on steroids  for possible vasculitis will gently taper. Follow skin biopsy done on 05/31/2015.  Acute on CKD-IV: (Baseline Cr 1.6~2.2) - Renal ultrasound nonacute, renal vascular duplex inconclusive, does have proteinuria and hematuria on UA, renal following, currently on PO steroids. We will defer further management to renal. Immunological panel pending. Per renal she may still require renal biopsy.  Macular Rash in upper extremities, legs and back:---  MCTD/vasculitis of skin? Unclear etiology. Not tender, not itchy. Patient missed an appointment with dermatologist due to recent surgery, since there is question of vasculitis with worsening renal function and she's currently been placed on empiric steroids. Skin biopsy was done by general surgery on 05/31/2015, pending pathology. Outpatient dermatology follow-up.   COPD: -No wheezing she is stable, continue Brovana BID  OSA? -CPAP at night. Outpatient sleep study.  Acute on chronic Chronic Diastolic cardiac heart failure: EF 55% Has edema, renal following, currently on Lasix 80 mg twice a day, monitor intake and output and weights, continue Coreg, hydralazine and Imdur. Monitor clinically.   HTN: Blood pressure stable continue combination of Coreg, clonidine, Imdur, have increased the dose of hydralazine continue to monitor.  HLD:  On Crestor 10 mg daily continue  GERD:  Protonix  Anemia, iron deficiency:  Hemoccult positive, on iron supplementation, no need for transfusion, no brisk bleeding. Outpatient age-appropriate GI follow-up. Continue PPI.   Symptomatic cholelithiasis: -s/p of laparoscopic cholecystectomy. No acute issues. Doing well.   Depression and anxiety: -Stable, no suicidal or homicidal ideations. Continue Celexa 20 mg daily  Gout: stable, continue home uloric 40 mg daily, hold allopurinol.  Hyperkalemia  resolved  DM-II: Controlled  04/20/2015 hemoglobin A1c = 6.3, On insulin  70/30 at home, since she is on steroids sugars were  running high hence sliding scale insulin was added. Will monitor CBGs and adjust.  CBG (last 3)   Recent Labs  05/31/15 2105 06/01/15 0819 06/01/15 1208  GLUCAP 154* 128* 220*     Code Status: FULL Family Communication:  family present at time of exam Disposition Plan: Home vs SNF    Consultants: Dr.[facility]  Procedure/Significant Events: 12/21 echocardiogram;- Left ventricle: mild concentric hypertrophy. -LVEF= 55% to 60%. - (grade 2 diastolic dysfunction). - Left atrium: moderately dilated. - Pulmonary arteries: PA peak pressure: 31 mm Hg (S). 1/25 CXR;-patchy LLL airspace consolidation/small left pleural effusion, developing pneumonia.? 1/29 - renal vascular ultrasound.  Inconclusive. 05/31/2015. Skin biopsy ordered  Serologies -C3/C4 WNL -Myeloperoxidase antibody/P- ANKA WNL -ANA negative -GBM antibody negative -RPR negative -Anti-scleroderma antibody pending  -anti-double-stranded DNA pending -Sjogrens Ab A/B pending -Anti-RNP pending  -ESR high 97 -  Culture 1/25 blood right hand/forearm NGTD 1/26 urine insignificant growth 1/26 strep pneumo urine antigen negative 1/26 influenza A/B/H1N1 negative 1/28 acute hepatitis panel pending 1/28 HIV 1/2 negative  DVT prophylaxis: Heparin    Continuous Infusions:   Objective: VITAL SIGNS: Temp: 97.6 F (36.4 C) (01/31 0558) Temp Source: Oral (01/31 0558) BP: 193/52 mmHg (01/31 1000) Pulse Rate: 64 (01/31 1000) SPO2; FIO2:   Intake/Output Summary (Last 24 hours) at 06/01/15 1258 Last data filed at 06/01/15 1026  Gross per 24 hour  Intake      0 ml  Output   1950 ml  Net  -1950 ml     Exam: General:  A/O 4, states feels better than at admission., No acute respiratory distress Eyes: Negative headache, double vision,negative scleral hemorrhage ENT: Negative Runny nose, negative gingival bleeding, Neck:  Negative scars, masses, torticollis, lymphadenopathy, JVD Lungs: diffuse  poor air movement (significantly improved), negative wheezes, mild LLL crackles Cardiovascular: Regular rate and rhythm without murmur gallop or rub normal S1 and S2 Abdomen: Morbidly obese, negative abdominal pain, nondistended, positive soft, bowel sounds, no rebound, no ascites, no appreciable mass Extremities: No significant cyanosis, clubbing. Bilateral lower extremity pitting edema 1+ to thighs. RLE eschar calf (per patient secondary to striking her calf with a walker) negative discharge, negative erythema Skin; diffuse macule papular rash on extremities and back, nonblanching, non-pruritic, erythematous. Rash is on palms of hands. Appears to be resolving with decreased erythema on legs and arms Psychiatric:  Negative depression, negative anxiety, negative fatigue, negative mania  Neurologic:  Cranial nerves II through XII intact, tongue/uvula midline, all extremities muscle strength 5/5, sensation intact throughout, negative dysarthria, negative expressive aphasia, negative receptive aphasia.   Data Reviewed: Basic Metabolic Panel:  Recent Labs Lab 05/28/15 0216 05/28/15 1225 05/29/15 0224 05/30/15 0413 05/31/15 5277 06/01/15 0751  NA 138  --  139 141 141 140  K 5.3*  --  4.8 4.6 4.1 3.8  CL 110  --  108 106 107 104  CO2 20*  --  19* 20* 23 23  GLUCOSE 137*  --  195* 126* 81 120*  BUN 76*  --  83* 94* 105* 118*  CREATININE 4.20*  --  4.18* 3.79* 3.88* 3.76*  CALCIUM 7.6*  --  8.0* 8.4* 8.3* 8.5*  MG  --  1.7  --  1.8  --   --   PHOS 5.2*  --  6.1*  --  6.6* 7.2*   Liver Function Tests:  Recent Labs Lab 05/26/15 2254 05/28/15 0216 05/29/15 0224 05/30/15 0413 05/31/15  6720 06/01/15 0751  AST 30  --   --  18  --   --   ALT 12*  --   --  8*  --   --   ALKPHOS 48  --   --  45  --   --   BILITOT 0.9  --   --  0.4  --   --   PROT 5.6*  --   --  5.8*  --   --   ALBUMIN 2.2* 1.8* 1.9* 1.8* 1.9* 2.0*   No results for input(s): LIPASE, AMYLASE in the last 168 hours. No  results for input(s): AMMONIA in the last 168 hours. CBC:  Recent Labs Lab 05/26/15 2254 05/28/15 0216 05/29/15 0224 05/30/15 0413 05/31/15 0638  WBC 15.5* 11.8* 8.6 6.5 8.9  NEUTROABS 14.2*  --   --  5.9  --   HGB 9.4* 8.3* 8.9* 9.1* 9.9*  HCT 30.0* 27.7* 29.2* 29.3* 31.0*  MCV 93.5 95.5 92.7 90.2 89.3  PLT 268 248 300 329 358   Cardiac Enzymes:  Recent Labs Lab 05/27/15 0142 05/27/15 0601 05/27/15 1612  TROPONINI 0.06* 0.05* 0.05*   BNP (last 3 results)  Recent Labs  04/23/15 0008 05/27/15 0602  BNP 298.3* 376.7*    ProBNP (last 3 results) No results for input(s): PROBNP in the last 8760 hours.  CBG:  Recent Labs Lab 05/31/15 1214 05/31/15 1648 05/31/15 2105 06/01/15 0819 06/01/15 1208  GLUCAP 167* 223* 154* 128* 220*    Recent Results (from the past 240 hour(s))  Culture, blood (routine x 2)     Status: None   Collection Time: 05/26/15 10:52 PM  Result Value Ref Range Status   Specimen Description BLOOD RIGHT HAND  Final   Special Requests BOTTLES DRAWN AEROBIC AND ANAEROBIC 5CC  Final   Culture NO GROWTH 5 DAYS  Final   Report Status 05/31/2015 FINAL  Final  Culture, blood (routine x 2)     Status: None   Collection Time: 05/26/15 10:53 PM  Result Value Ref Range Status   Specimen Description BLOOD RIGHT FOREARM  Final   Special Requests BOTTLES DRAWN AEROBIC AND ANAEROBIC 5CC  Final   Culture NO GROWTH 5 DAYS  Final   Report Status 05/31/2015 FINAL  Final  Urine culture     Status: None   Collection Time: 05/27/15  6:18 AM  Result Value Ref Range Status   Specimen Description URINE, CATHETERIZED  Final   Special Requests NONE  Final   Culture 2,000 COLONIES/mL INSIGNIFICANT GROWTH  Final   Report Status 05/28/2015 FINAL  Final     Studies:  Recent x-ray studies have been reviewed in detail by the Attending Physician  Scheduled Meds:  Anti-infectives    Start     Dose/Rate Route Frequency Ordered Stop   05/31/15 2300  vancomycin  (VANCOCIN) 1,500 mg in sodium chloride 0.9 % 500 mL IVPB  Status:  Discontinued     1,500 mg 250 mL/hr over 120 Minutes Intravenous Every 48 hours 05/30/15 1233 05/31/15 0842   05/31/15 0915  levofloxacin (LEVAQUIN) tablet 500 mg     500 mg Oral Every 48 hours 05/31/15 0903 05/31/15 1058   05/31/15 0845  levofloxacin (LEVAQUIN) tablet 500 mg  Status:  Discontinued     500 mg Oral Every 48 hours 05/31/15 0846 05/31/15 0903   05/28/15 2330  vancomycin (VANCOCIN) 1,500 mg in sodium chloride 0.9 % 500 mL IVPB  Status:  Discontinued  1,500 mg 250 mL/hr over 120 Minutes Intravenous Every 48 hours 05/27/15 0059 05/30/15 1233   05/27/15 0800  piperacillin-tazobactam (ZOSYN) IVPB 2.25 g  Status:  Discontinued     2.25 g 100 mL/hr over 30 Minutes Intravenous Every 8 hours 05/27/15 0059 05/31/15 0842   05/26/15 2315  vancomycin (VANCOCIN) 2,000 mg in sodium chloride 0.9 % 500 mL IVPB     2,000 mg 250 mL/hr over 120 Minutes Intravenous  Once 05/26/15 2242 05/27/15 0247   05/26/15 2245  piperacillin-tazobactam (ZOSYN) IVPB 3.375 g     3.375 g 100 mL/hr over 30 Minutes Intravenous  Once 05/26/15 2242 05/26/15 2328      Scheduled Meds: . amLODipine  10 mg Oral Daily  . arformoterol  15 mcg Nebulization BID  . aspirin  81 mg Oral Daily  . carvedilol  25 mg Oral BID WC  . citalopram  20 mg Oral Daily  . cloNIDine  0.3 mg Oral BID  . dextromethorphan-guaiFENesin  1 tablet Oral BID  . febuxostat  40 mg Oral Daily  . fenofibrate  160 mg Oral Daily  . furosemide  80 mg Oral BID  . heparin  5,000 Units Subcutaneous 3 times per day  . hydrALAZINE  50 mg Oral 3 times per day  . insulin aspart  0-5 Units Subcutaneous QHS  . insulin aspart  0-9 Units Subcutaneous TID WC  . insulin aspart protamine- aspart  55 Units Subcutaneous Q breakfast  . insulin aspart protamine- aspart  55 Units Subcutaneous Q supper  . ipratropium-albuterol  3 mL Nebulization TID  . isosorbide mononitrate  45 mg Oral Daily   . neomycin-bacitracin-polymyxin   Topical Daily  . nystatin cream   Topical BID  . omega-3 acid ethyl esters  1 g Oral Daily  . oxyCODONE-acetaminophen  1 tablet Oral BID   And  . oxyCODONE  5 mg Oral BID  . pantoprazole  40 mg Oral Daily  . predniSONE  60 mg Oral Q breakfast  . rosuvastatin  10 mg Oral Daily    Time spent on care of this patient: 40 mins   Signature  Lala Lund K M.D on 06/01/2015 at 12:58 PM  Between 7am to 7pm - Pager - [phone], After 7pm go to [email or web address] - password [facility]  [facility]  - Office  [phone]

## 2015-06-02 DIAGNOSIS — J189 Pneumonia, unspecified organism: Secondary | ICD-10-CM

## 2015-06-02 DIAGNOSIS — N189 Chronic kidney disease, unspecified: Secondary | ICD-10-CM

## 2015-06-02 DIAGNOSIS — Z794 Long term (current) use of insulin: Secondary | ICD-10-CM

## 2015-06-02 DIAGNOSIS — E119 Type 2 diabetes mellitus without complications: Secondary | ICD-10-CM

## 2015-06-02 DIAGNOSIS — N179 Acute kidney failure, unspecified: Secondary | ICD-10-CM

## 2015-06-02 DIAGNOSIS — I5032 Chronic diastolic (congestive) heart failure: Secondary | ICD-10-CM

## 2015-06-02 DIAGNOSIS — R21 Rash and other nonspecific skin eruption: Secondary | ICD-10-CM

## 2015-06-02 DIAGNOSIS — J438 Other emphysema: Secondary | ICD-10-CM

## 2015-06-02 DIAGNOSIS — A419 Sepsis, unspecified organism: Principal | ICD-10-CM

## 2015-06-02 LAB — GLUCOSE, CAPILLARY
Glucose-Capillary: 94 mg/dL (ref 65–99)
Glucose-Capillary: 134 mg/dL — ABNORMAL HIGH (ref 65–99)
Glucose-Capillary: 274 mg/dL — ABNORMAL HIGH (ref 65–99)
Glucose-Capillary: 244 mg/dL — ABNORMAL HIGH (ref 65–99)

## 2015-06-02 LAB — RENAL FUNCTION PANEL
Albumin: 2 g/dL — ABNORMAL LOW (ref 3.5–5.0)
Anion gap: 13 (ref 5–15)
BUN: 133 mg/dL — AB (ref 6–20)
CHLORIDE: 104 mmol/L (ref 101–111)
CO2: 23 mmol/L (ref 22–32)
Calcium: 8.3 mg/dL — ABNORMAL LOW (ref 8.9–10.3)
Creatinine, Ser: 3.89 mg/dL — ABNORMAL HIGH (ref 0.44–1.00)
GFR calc Af Amer: 12 mL/min — ABNORMAL LOW (ref >60)
GFR calc non Af Amer: 10 mL/min — ABNORMAL LOW (ref >60)
GLUCOSE: 89 mg/dL (ref 65–99)
POTASSIUM: 3.8 mmol/L (ref 3.5–5.1)
Phosphorus: 7.7 mg/dL — ABNORMAL HIGH (ref 2.5–4.6)
Sodium: 140 mmol/L (ref 135–145)

## 2015-06-02 LAB — SJOGRENS SYNDROME-B EXTRACTABLE NUCLEAR ANTIBODY: SSB (La) (ENA) Antibody, IgG: <0.2 AI (ref 0.0–0.9)

## 2015-06-02 LAB — CBC
HEMATOCRIT: 28.7 % — AB (ref 36.0–46.0)
Hemoglobin: 9.4 g/dL — ABNORMAL LOW (ref 12.0–15.0)
MCH: 28.6 pg (ref 26.0–34.0)
MCHC: 32.8 g/dL (ref 30.0–36.0)
MCV: 87.2 fL (ref 78.0–100.0)
Platelets: 298 10*3/uL (ref 150–400)
RBC: 3.29 MIL/uL — ABNORMAL LOW (ref 3.87–5.11)
RDW: 15.6 % — AB (ref 11.5–15.5)
WBC: 7.3 10*3/uL (ref 4.0–10.5)

## 2015-06-02 LAB — ANTI-SCLERODERMA ANTIBODY: Scleroderma (Scl-70) (ENA) Antibody, IgG: <0.2 AI (ref 0.0–0.9)

## 2015-06-02 LAB — ANTI-DNA ANTIBODY, DOUBLE-STRANDED: ds DNA Ab: <1 IU/mL (ref 0–9)

## 2015-06-02 LAB — ANTIEXTRACTABLE NUCLEAR AG: RIBONUCLEIC PROTEIN: 0.6 AI (ref 0.0–0.9)

## 2015-06-02 LAB — SJOGRENS SYNDROME-A EXTRACTABLE NUCLEAR ANTIBODY: SSA (Ro) (ENA) Antibody, IgG: <0.2 AI (ref 0.0–0.9)

## 2015-06-02 LAB — HEPATITIS PANEL, ACUTE
HEP A IGM: NEGATIVE
Hep B C IgM: NEGATIVE
Hepatitis B Surface Ag: NEGATIVE

## 2015-06-02 LAB — RPR: RPR Ser Ql: NONREACTIVE

## 2015-06-02 MED ORDER — FUROSEMIDE 40 MG PO TABS
40.0000 mg | ORAL_TABLET | Freq: Two times a day (BID) | ORAL | Status: DC
  Administered 2015-06-02 – 2015-06-03 (×3): 40 mg via ORAL
  Filled 2015-06-02 (×4): qty 1

## 2015-06-02 MED ORDER — PREDNISONE 20 MG PO TABS
40.0000 mg | ORAL_TABLET | Freq: Every day | ORAL | Status: DC
  Administered 2015-06-02 – 2015-06-04 (×3): 40 mg via ORAL
  Filled 2015-06-02 (×3): qty 2

## 2015-06-02 NOTE — Progress Notes (Signed)
Patient ID: Anna [name], female   DOB: [birth year], 81 y.o.   MRN: [id number]  [facility] Progress Note   Assessment/ Plan:   1. Acute renal failure on chronic kidney disease stage III: (baseline creatinine 1.6-2.0, followed by Dr. Lorrene Reid). Presented with fever/rash and new-onset proteinuria and hematuria status post laparoscopic cholecystectomy with recorded history of exposure to Ancef. Serologies for vasculitis are negative. Significant lack of improvement of her renal function on corticosteroids-a kidney biopsy is warranted and has been requested to be done by IR. I will decrease her prednisone to 50 mg daily. Clinically, she is mildly hypervolemic and without any acute electrolyte abnormalities to prompt intervention. In spite of her elevated BUN (from corticosteroid/Lasix), she does not have any uremic symptoms. 2. Healthcare associated pneumonia with left lower lobe infiltrate: Antibiotic therapy now narrowed down to levofloxacin (previously on vancomycin/Zosyn) 3. Acute blood loss anemia status post laparoscopic cholecystectomy: Improved status post blood transfusion, not on ESA at this time. 4. Hypertension: Her pressures intermittently elevated on a multidrug regimen-I added hydralazine yesterday with some improvement of blood pressure control-continue to monitor, not a contraindication for kidney biopsy at current levels.  Subjective:   Reports to feeling fair-was looking forward to going home today    Objective:   BP 151/46 mmHg  Pulse 61  Temp(Src) 98 F (36.7 C) (Oral)  Resp 20  Ht 5\' 4"  (1.626 m)  Wt 107.7 kg (237 lb 7 oz)  BMI 40.74 kg/m2  SpO2 97%  Intake/Output Summary (Last 24 hours) at 06/02/15 1133 Last data filed at 06/02/15 [id number]  Gross per 24 hour  Intake    240 ml  Output    800 ml  Net   -560 ml   Weight change: -2 kg (-4 lb 6.5 oz)  Physical Exam: Gen: comfortable resting in bed family member at bedside CVS: pulse regular, bradycardic  rate, s1 and s2 normal Resp: Decreased breath sounds over bases bilaterally, no rales  [id number], obese, NT Ext: petechial (non-blanching) rash over arms and back--- very rare lesions over legs.   Imaging: No results found.  Labs: BMET  Recent Labs Lab 05/27/15 1612 05/28/15 0216 05/29/15 0224 05/30/15 0413 05/31/15 [id number] 06/01/15 0751 06/02/15 0606  NA 140 138 139 141 141 140 140  K 5.0 5.3* 4.8 4.6 4.1 3.8 3.8  CL 111 110 108 106 107 104 104  CO2 19* 20* 19* 20* 23 23 23   GLUCOSE 71 137* 195* 126* 81 120* 89  BUN 71* 76* 83* 94* 105* 118* 133*  CREATININE 3.87* 4.20* 4.18* 3.79* 3.88* 3.76* 3.89*  CALCIUM 7.7* 7.6* 8.0* 8.4* 8.3* 8.5* 8.3*  PHOS  --  5.2* 6.1*  --  6.6* 7.2* 7.7*   CBC  Recent Labs Lab 05/26/15 2254  05/29/15 0224 05/30/15 0413 05/31/15 0638 06/02/15 0606  WBC 15.5*  < > 8.6 6.5 8.9 7.3  NEUTROABS 14.2*  --   --  5.9  --   --   HGB 9.4*  < > 8.9* 9.1* 9.9* 9.4*  HCT 30.0*  < > 29.2* 29.3* 31.0* 28.7*  MCV 93.5  < > 92.7 90.2 89.3 87.2  PLT 268  < > 300 329 358 298  < > = values in this interval not displayed.  Medications:    . amLODipine  10 mg Oral Daily  . arformoterol  15 mcg Nebulization BID  . aspirin  81 mg Oral Daily  . carvedilol  25 mg Oral BID WC  .  citalopram  20 mg Oral Daily  . cloNIDine  0.3 mg Oral BID  . dextromethorphan-guaiFENesin  1 tablet Oral BID  . febuxostat  40 mg Oral Daily  . fenofibrate  160 mg Oral Daily  . furosemide  80 mg Oral BID  . heparin  5,000 Units Subcutaneous 3 times per day  . hydrALAZINE  50 mg Oral 3 times per day  . insulin aspart protamine- aspart  55 Units Subcutaneous Q breakfast  . insulin aspart protamine- aspart  55 Units Subcutaneous Q supper  . ipratropium-albuterol  3 mL Nebulization TID  . isosorbide mononitrate  45 mg Oral Daily  . neomycin-bacitracin-polymyxin   Topical Daily  . nystatin cream   Topical BID  . omega-3 acid ethyl esters  1 g Oral Daily  . oxyCODONE-acetaminophen   1 tablet Oral BID   And  . oxyCODONE  5 mg Oral BID  . pantoprazole  40 mg Oral Daily  . predniSONE  60 mg Oral Q breakfast  . rosuvastatin  10 mg Oral Daily   Elmarie Shiley, MD 06/02/2015, 11:33 AM

## 2015-06-02 NOTE — Progress Notes (Signed)
Inpatient Diabetes Program Recommendations  AACE/ADA: New Consensus Statement on Inpatient Glycemic Control (2015)  Target Ranges:  Prepandial:   less than 140 mg/dL      Peak postprandial:   less than 180 mg/dL (1-2 hours)      Critically ill patients:  140 - 180 mg/dL   Review of Glycemic Control:  Results for Anna [name], Anna [name] (MRN [id number]) as of 06/02/2015 09:37  Ref. Range 06/01/2015 08:19 06/01/2015 12:08 06/01/2015 16:53 06/01/2015 22:14 06/02/2015 08:29  Glucose-Capillary Latest Ref Range: 65-99 mg/dL 128 (H) 220 (H) 222 (H) 146 (H) 94   Diabetes history: Diabetes Mellitus insulin dependent Outpatient Diabetes medications: 75/25 55 units in the AM and 65 units in the PM Current orders for Inpatient glycemic control:  Novolog 70/30 55 units bid  Inpatient Diabetes Program Recommendations:   Note that patient has not received Novolog 70/30 in the mornings on both 05/31/15 and 06/01/15 however she did receive PM dose.  May consider slight decrease in Novolog 70/30 dose while in the hospital.  Consider reducing to Novolog 70/30 35 units bid.  Thanks, Adah Perl, RN, BC-ADM Inpatient Diabetes Coordinator Pager [phone] (8a-5p)

## 2015-06-02 NOTE — Consult Note (Signed)
Chief Complaint: Patient was seen in consultation today for ultrasound-guided random renal biopsy Chief Complaint  Patient presents with  . Fever  . Post-op Problem    Referring Physician(s): Patel,J  History of Present Illness: Anna [name] is a 81 y.o. female with past medical history significant for hypertension, COPD, GERD, obesity, sleep apnea, diabetes, anemia, and chronic kidney disease. She recently presented to the hospital with fever/rationale onset proteinuria and hematuria following laparoscopic cholecystectomy and reported history of exposure to [place]. Vasculitis serologies have been negative and the patient has demonstrated no significant improvement in her renal function on corticosteroids. She has also had diminished appetite as well as cough and microhematuria. She has been on antibiotic therapy for healthcare associated pneumonia but is currently afebrile with normal WBC count. Request now received from nephrology for ultrasound-guided random renal biopsy for further evaluation.  Past Medical History  Diagnosis Date  . Hypertension   . COPD (chronic obstructive pulmonary disease) ([place])   . Neuromuscular disorder ([place])   . Depression with anxiety   . Osteopenia   . Arthritis   . GERD (gastroesophageal reflux disease)   . Hyperlipidemia   . Obesity   . Vitamin B12 deficiency   . Chronic kidney disease (CKD), stage IV (severe) ([place])   . Sleep apnea     no cpap use  . Anemia     iron infusion- x2 recent , last 5'16  . Hx of adenomatous colonic polyps 09/2014  . Diabetes mellitus without complication ([place])     type ll   dx about 30 yrs ago.  [name] HNP (herniated nucleus pulposus with myelopathy), thoracic   . Diastolic CHF ([place])   . Pneumonia 05/2015    Past Surgical History  Procedure Laterality Date  . Breast surgery  April 1996    needle biopsy   . Tubal ligation    . Cataract extraction, bilateral Bilateral   . Esophagogastroduodenoscopy N/A  10/19/2014    Procedure: ESOPHAGOGASTRODUODENOSCOPY (EGD);  Surgeon: Gatha Mayer, MD;  Location: [facility];  Service: Endoscopy;  Laterality: N/A;  . Colonoscopy N/A 10/19/2014    Procedure: COLONOSCOPY;  Surgeon: Gatha Mayer, MD;  Location: [facility];  Service: Endoscopy;  Laterality: N/A;  . Ercp N/A 04/22/2015    Procedure: ENDOSCOPIC RETROGRADE CHOLANGIOPANCREATOGRAPHY (ERCP);  Surgeon: Milus Banister, MD;  Location: [facility];  Service: Endoscopy;  Laterality: N/A;  . Laparoscopic cholecystectomy  05/25/2015  . Cholecystectomy N/A 05/25/2015    Procedure: LAPAROSCOPIC CHOLECYSTECTOMY;  Surgeon: Coralie Keens, MD;  Location: [facility];  Service: General;  Laterality: N/A;    Allergies: Ace inhibitors; Niaspan; and Other  Medications: Prior to Admission medications   Medication Sig Start Date End Date Taking? Authorizing Provider  acetaminophen (TYLENOL) 650 MG CR tablet Take 1,300 mg by mouth at bedtime.   Yes Historical Provider, MD  Alcohol Swabs (PHARMACIST CHOICE ALCOHOL) PADS USE 2 TIMES DAILY FOR DIABETIC TESTING 12/09/14  Yes Chipper Herb, MD  amLODipine (NORVASC) 10 MG tablet Take 1 tablet (10 mg total) by mouth daily. 04/25/15  Yes Thurnell Lose, MD  aspirin 81 MG tablet Take 81 mg by mouth daily.     Yes Historical Provider, MD  budesonide-formoterol (SYMBICORT) 80-4.5 MCG/ACT inhaler Inhale 1 puff into the lungs 2 (two) times daily. 11/26/14  Yes Mary-Margaret Hassell Done, FNP  carvedilol (COREG) 25 MG tablet TAKE 1 TABLET BY MOUTH TWICE DAILY WITH A MEAL 12/10/14  Yes Mary-Margaret Hassell Done, FNP  citalopram (  CELEXA) 20 MG tablet TAKE 1 TABLET BY MOUTH EVERY DAY 12/10/14  Yes Mary-Margaret Hassell Done, FNP  cloNIDine (CATAPRES) 0.2 MG tablet Take 1 tablet (0.2 mg total) by mouth 3 (three) times daily. 04/25/15  Yes Thurnell Lose, MD  EASY TOUCH INSULIN SYRINGE 31G X 5/16" 1 ML MISC USE TO INJECT INSULIN TWICE DAILY 12/10/12  Yes Mary-Margaret Hassell Done, FNP  EASY TOUCH PEN  NEEDLES 31G X 8 MM MISC USE TWICE DAILY 04/30/15  Yes Mary-Margaret Hassell Done, FNP  febuxostat (ULORIC) 40 MG tablet Take 1 tablet (40 mg total) by mouth daily. 11/26/14  Yes Mary-Margaret Hassell Done, FNP  fenofibrate (TRICOR) 145 MG tablet Take 1 tablet (145 mg total) by mouth daily. 11/26/14  Yes Mary-Margaret Hassell Done, FNP  Ferrous Sulfate (IRON) 325 (65 FE) MG TABS Take 1 tablet by mouth daily. Patient taking differently: Take 1 tablet by mouth 2 (two) times daily.  03/08/15  Yes Chipper Herb, MD  furosemide (LASIX) 40 MG tablet Take 1 tablet (40 mg total) by mouth daily. 04/25/15  Yes Thurnell Lose, MD  gabapentin (NEURONTIN) 300 MG capsule TAKE 1 CAPSULE BY MOUTH FOUR TIMES A DAY 03/26/15  Yes Mary-Margaret Hassell Done, FNP  glucose blood (ACCU-CHEK AVIVA PLUS) test strip Test 4-5 x a day and prn dx E11.9 01/07/15  Yes Mary-Margaret Hassell Done, FNP  glucose blood (ONETOUCH VERIO) test strip Test 4x a day and prn  E11.9 04/19/15  Yes Mary-Margaret Hassell Done, FNP  glucose blood test strip Test 4x a day and prn  Dx E11.9 01/07/15  Yes Mary-Margaret Hassell Done, FNP  Insulin Lispro Prot & Lispro (HUMALOG MIX 75/25 KWIKPEN) (75-25) 100 UNIT/ML Kwikpen 55 u in am and 65u in pm Patient taking differently: Inject 55-65 Units into the skin 2 (two) times daily. 55 u in am and 65u in pm 04/15/15  Yes Mary-Margaret Hassell Done, FNP  isosorbide mononitrate (IMDUR) 30 MG 24 hr tablet Take 1 tablet (30 mg total) by mouth daily. 04/25/15  Yes Thurnell Lose, MD  nystatin cream (MYCOSTATIN) APPLY TOPICALLY TWO TIMES DAILY. 03/26/15  Yes Mary-Margaret Hassell Done, FNP  Omega-3 Fatty Acids (FISH OIL) 1000 MG CAPS Take 1 capsule by mouth daily.   Yes Historical Provider, MD  omeprazole (PRILOSEC) 40 MG capsule TAKE 1 CAPSULE BY MOUTH EVERY DAY 11/26/14  Yes Mary-Margaret Hassell Done, FNP  oxyCODONE-acetaminophen (PERCOCET) 10-325 MG tablet Take 1 tablet by mouth 2 (two) times daily. 04/30/15  Yes Mary-Margaret Hassell Done, FNP  PHARMACIST CHOICE LANCETS MISC  USE 2 TIMES DAILY FOR DIABETIC TESTING 12/09/14  Yes Chipper Herb, MD  polyethylene glycol [facility] / GLYCOLAX) packet Take 17 g by mouth daily. And an additional packet if needed   Yes Historical Provider, MD  rosuvastatin (CRESTOR) 20 MG tablet TAKE 1 TABLET BY MOUTH EVERY DAY 04/30/15  Yes Mary-Margaret Hassell Done, FNP  glimepiride (AMARYL) 4 MG tablet TAKE 1/2 TABLET BY MOUTH EVERY DAY BEFORE BREAKFAST Patient not taking: Reported on 05/17/2015 04/02/15   Mary-Margaret Hassell Done, FNP     Family History  Problem Relation Age of Onset  . Hypertension Mother   . Hypertension Father   . Diabetes Father   . COPD Father   . Heart disease Father   . Diabetes Sister     x 2  . Heart attack Sister     stent  . Heart disease Sister   . Colon cancer Neg Hx   . Colon polyps Neg Hx   . Esophageal cancer Neg Hx   . Skin cancer Sister   .  Testicular cancer Brother     Social History   Social History  . Marital Status: Widowed    Spouse Name: N/A  . Number of Children: 35  . Years of Education: N/A   Occupational History  . Retired    Social History Main Topics  . Smoking status: Former Smoker -- 2.00 packs/day for 60 years    Types: Cigarettes  . Smokeless tobacco: Never Used     Comment: "quit smoking in ~ 2009"  . Alcohol Use: No  . Drug Use: No  . Sexual Activity: Not Asked   Other Topics Concern  . None   Social History Narrative   Widowed 1 son and 1 daughter   Retired   No caffeine      Review of Systems patient currently denies fever, chills, headache, chest pain, worsening dyspnea, abdominal/back pain, nausea /vomiting or abnormal bleeding.                                                                                                                 Vital Signs: BP 134/42 mmHg  Pulse 56  Temp(Src) 97.7 F (36.5 C) (Oral)  Resp 20  Ht 5\' 4"  (1.626 m)  Wt 237 lb 7 oz (107.7 kg)  BMI 40.74 kg/m2  SpO2 98%  Physical Exam patient is awake, alert. Chest with  diminished breath sounds at bases, more so on left. Heart with bradycardic but regular rhythm. Abdomen obese, positive bowel sounds, nontender. Lower extremities with 1-2+ edema bilaterally; also present is petechial rash over the arms /back/few on LE's  Mallampati Score:  MD Evaluation Airway: WNL Heart: WNL Abdomen: WNL Chest/ Lungs: WNL ASA  Classification: 3 Mallampati/Airway Score: Two  Imaging: Dg Chest 2 View  05/31/2015  CLINICAL DATA:  Shortness of Breath EXAM: CHEST  2 VIEW COMPARISON:  05/26/2015 FINDINGS: Cardiomediastinal silhouette is stable. Persistent left small pleural effusion with left basilar atelectasis or infiltrate. New linear atelectasis or early infiltrate in lingula. Right lung is clear. No pulmonary edema. IMPRESSION: Persistent left small pleural effusion with left basilar atelectasis or infiltrate. New linear atelectasis or early infiltrate in lingula. Right lung is clear. Electronically Signed   By: Lahoma Crocker M.D.   On: 05/31/2015 09:55   Dg Chest 2 View  05/26/2015  CLINICAL DATA:  Fever and hypoxia. EXAM: CHEST  2 VIEW COMPARISON:  04/23/2015 FINDINGS: The cardiac silhouette is enlarged. Mediastinal contours appear intact. Torturous knee and atherosclerotic disease of the aorta are seen. There is no evidence of pneumothorax. Low lung volumes. Mild pulmonary vascular congestion. There is a patchy left lower lobe airspace consolidation. There is a probable small left pleural effusion. Osseous structures are without acute abnormality. Soft tissues are grossly normal. IMPRESSION: Low lung volumes with patchy left lower lobe airspace consolidation and small left pleural effusion, which may represent developing pneumonia. Mild pulmonary vascular congestion. Electronically Signed   By: Fidela [place] M.D.   On: 05/26/2015 22:52   US Renal  05/27/2015  CLINICAL DATA:  Acute kidney  injury. Bilateral renal atrophy seen on previous CT and ultrasound examinations. EXAM:  RENAL / URINARY TRACT ULTRASOUND COMPLETE COMPARISON:  CT abdomen and pelvis 04/20/2015 FINDINGS: Right Kidney: Length: 12.6 cm. Mild diffuse parenchymal thinning. No solid mass or hydronephrosis. Left Kidney: Left kidney is not visualized due to overlying bowel gas. Unable to reposition patient for better visualization. Bladder: No bladder wall thickening or intraluminal filling defects. IMPRESSION: Mild diffuse parenchymal atrophy in the right kidney. Left kidney is not visualized due to bowel gas. Electronically Signed   By: Lucienne Capers M.D.   On: 05/27/2015 02:17    Labs:  CBC:  Recent Labs  05/29/15 0224 05/30/15 0413 05/31/15 0638 06/02/15 0606  WBC 8.6 6.5 8.9 7.3  HGB 8.9* 9.1* 9.9* 9.4*  HCT 29.2* 29.3* 31.0* 28.7*  PLT 300 329 358 298    COAGS:  Recent Labs  05/27/15 0142  INR 1.35  APTT 37    BMP:  Recent Labs  05/30/15 0413 05/31/15 0638 06/01/15 0751 06/02/15 0606  NA 141 141 140 140  K 4.6 4.1 3.8 3.8  CL 106 107 104 104  CO2 20* 23 23 23   GLUCOSE 126* 81 120* 89  BUN 94* 105* 118* 133*  CALCIUM 8.4* 8.3* 8.5* 8.3*  CREATININE 3.79* 3.88* 3.76* 3.89*  GFRNONAA 10* 10* 11* 10*  GFRAA 12* 12* 12* 12*    LIVER FUNCTION TESTS:  Recent Labs  04/22/15 1030 04/23/15 0617 05/26/15 2254  05/30/15 0413 05/31/15 [id number] 06/01/15 0751 06/02/15 0606  BILITOT 1.2 3.6* 0.9  --  0.4  --   --   --   AST 96* 225* 30  --  18  --   --   --   ALT 91* 126* 12*  --  8*  --   --   --   ALKPHOS 41 75 48  --  45  --   --   --   PROT 5.6* 6.4* 5.6*  --  5.8*  --   --   --   ALBUMIN 2.6* 2.5* 2.2*  < > 1.8* 1.9* 2.0* 2.0*  < > = values in this interval not displayed.  TUMOR MARKERS: No results for input(s): AFPTM, CEA, CA199, CHROMGRNA in the last 8760 hours.  Assessment and Plan: Patient with history of  acute renal failure on chronic disease, diabetes, CHF, microhematuria/proteinuria, hypertension, and anemia. Recently treated for left lower lobe  pneumonia. Request now received for ultrasound-guided random renal biopsy.Risks and benefits discussed with the patient/daughter including, but not limited to bleeding, infection, damage to adjacent structures or low yield requiring additional tests.All of the patient's questions were answered, patient is agreeable to proceed.Consent signed and in chart. Procedure tentatively scheduled for 2/2.     Thank you for this interesting consult.  I greatly enjoyed meeting TAWSHA VANGORP and look forward to participating in their care.  A copy of this report was sent to the requesting provider on this date.  Electronically Signed: D. Rowe Robert 06/02/2015, 4:15 PM   I spent a total of 20 minutes in face to face in clinical consultation, greater than 50% of which was counseling/coordinating care for ultrasound-guided random renal biopsy

## 2015-06-02 NOTE — Progress Notes (Signed)
Patient refused CPAP for tonight. CPAP in room. RT made patient aware that if she changed her mind to call and we would place her on.

## 2015-06-02 NOTE — Progress Notes (Signed)
[facility] PROGRESS NOTE    Progress Note   Anna [name] [id number] DOB: May 04, 1934 DOA: 05/26/2015 PCP: Chevis Pretty, FNP   Brief Narrative:   Anna [name] is an 81 y.o. female past medical history of chronic diastolic heart failure, type 2 diabetes mellitus, COPD on oxygen dependent with a recent hospitalization for choledocholithiasis status post laparoscopic cholecystectomy in 05/25/2015, chronic kidney disease stage IV with presents to the ED with fever cough and shortness of breath. Chest x-ray was done that showed a left lower lobe infiltrate he was started empirically on antibiotics. Nephrology was consulted due to his worsening creatinine recommended a skin biopsy performed by surgery on 05/31/2015. Was placed on oral steroids.  Assessment/Plan:   Sepsis due to HCAP (healthcare-associated pneumonia) She was started empirically on IV vancomycin and Zosyn for healthcare associated pneumonia taper to Levaquin she will continue for 2 additional days. She has been placed on steroids for possible pneumonitis/vasculitis will continue to keep them slowly down. Skin biopsy performed on 05/31/2015 (results are pending)   Acute on chronic kidney disease stage IV with a baseline creatinine 1.6 1.2: Renal ultrasound showed no acute abnormalities, duplex was inconclusive, the patient doesn't have proteinuria a right blood cells in urine. Renal consulted who recommended to start empiric steroids. Immunological panel is pending. She may still require renal biopsy skin biopsy is inconclusive. No uremic symptoms.  Macular rash in upper extremity legs and back/skin vasculitis/ MCTD (mixed connective tissue disease) ([place]): Unclear etiology, patient did miss several appointments with his dermatologist due to recent surgery. With worsening renal function skin biopsy was done which results are pending and he was started empirically on steroids.  COPD: Fairly stable,  continue current regimen.  Obstructive sleep apnea: Contingency prep at night.  Acute on chronic diastolic dysfunction with an EF of 50%: Renal was consulted he was started on IV Lasix twice a day, he was continue on Coreg hydralazine and Imdur.  Essential hypertension: Continue Coreg, clonidine Imdur and hydralazine.  Diabetes mellitus, insulin dependent (IDDM), uncontrolled (HCC)/Diabetic neuropathy ([place]): A1c of 6.3 continue insulin 7030.  DVT Prophylaxis - Lovenox ordered.  Family Communication: none Disposition Plan: Home on friday Code Status:     Code Status Orders        Start     Ordered   05/27/15 0048  Do not attempt resuscitation (DNR)   Continuous    Question Answer Comment  In the event of cardiac or respiratory ARREST Do not call a "code blue"   In the event of cardiac or respiratory ARREST Do not perform Intubation, CPR, defibrillation or ACLS   In the event of cardiac or respiratory ARREST Use medication by any route, position, wound care, and other measures to relive pain and suffering. May use oxygen, suction and manual treatment of airway obstruction as needed for comfort.      05/27/15 0049    Code Status History    Date Active Date Inactive Code Status Order ID Comments User Context   05/25/2015  1:42 PM 05/26/2015 12:18 PM Full Code [id number]  Coralie Keens, MD Inpatient   04/20/2015 10:56 PM 04/25/2015  1:48 PM Full Code [id number]  Gardiner Barefoot, NP Inpatient   04/20/2015  8:06 PM 04/20/2015 10:56 PM DNR [id number]  Samella Parr, NP Inpatient        IV Access:    Peripheral IV   Procedures and diagnostic studies:   No results found.   Medical Consultants:    None.  Anti-Infectives:   Anti-infectives    Start     Dose/Rate Route Frequency Ordered Stop   05/31/15 2300  vancomycin (VANCOCIN) 1,500 mg in sodium chloride 0.9 % 500 mL IVPB  Status:  Discontinued     1,500 mg 250 mL/hr over 120 Minutes Intravenous Every 48  hours 05/30/15 1233 05/31/15 0842   05/31/15 0915  levofloxacin (LEVAQUIN) tablet 500 mg     500 mg Oral Every 48 hours 05/31/15 0903 05/31/15 1058   05/31/15 0845  levofloxacin (LEVAQUIN) tablet 500 mg  Status:  Discontinued     500 mg Oral Every 48 hours 05/31/15 0846 05/31/15 0903   05/28/15 2330  vancomycin (VANCOCIN) 1,500 mg in sodium chloride 0.9 % 500 mL IVPB  Status:  Discontinued     1,500 mg 250 mL/hr over 120 Minutes Intravenous Every 48 hours 05/27/15 0059 05/30/15 1233   05/27/15 0800  piperacillin-tazobactam (ZOSYN) IVPB 2.25 g  Status:  Discontinued     2.25 g 100 mL/hr over 30 Minutes Intravenous Every 8 hours 05/27/15 0059 05/31/15 0842   05/26/15 2315  vancomycin (VANCOCIN) 2,000 mg in sodium chloride 0.9 % 500 mL IVPB     2,000 mg 250 mL/hr over 120 Minutes Intravenous  Once 05/26/15 2242 05/27/15 0247   05/26/15 2245  piperacillin-tazobactam (ZOSYN) IVPB 3.375 g     3.375 g 100 mL/hr over 30 Minutes Intravenous  Once 05/26/15 2242 05/26/15 2328      Subjective:    Anna [name] with a good appetite today no complaints.  Objective:    Filed Vitals:   06/02/15 0425 06/02/15 0651 06/02/15 0900 06/02/15 0955  BP: 159/48   151/46  Pulse: 59   61  Temp: 98 F (36.7 C)     TempSrc: Oral     Resp: 20     Height:      Weight:  107.7 kg (237 lb 7 oz)    SpO2: 99%  97%     Intake/Output Summary (Last 24 hours) at 06/02/15 1311 Last data filed at 06/02/15 0653  Gross per 24 hour  Intake      0 ml  Output    800 ml  Net   -800 ml   Filed Weights   05/31/15 0559 06/01/15 0500 06/02/15 0651  Weight: 106.8 kg (235 lb 7.2 oz) 109.7 kg (241 lb 13.5 oz) 107.7 kg (237 lb 7 oz)    Exam: Gen:  NAD Cardiovascular:  RRR, No M/R/G Chest and lungs:   CTAB Abdomen:  Abdomen soft, NT/ND, + BS Extremities:  No C/E/C   Data Reviewed:    Labs: Basic Metabolic Panel:  Recent Labs Lab 05/28/15 0216 05/28/15 1225 05/29/15 0224 05/30/15 0413 05/31/15 [id number]  06/01/15 0751 06/02/15 0606  NA 138  --  139 141 141 140 140  K 5.3*  --  4.8 4.6 4.1 3.8 3.8  CL 110  --  108 106 107 104 104  CO2 20*  --  19* 20* 23 23 23   GLUCOSE 137*  --  195* 126* 81 120* 89  BUN 76*  --  83* 94* 105* 118* 133*  CREATININE 4.20*  --  4.18* 3.79* 3.88* 3.76* 3.89*  CALCIUM 7.6*  --  8.0* 8.4* 8.3* 8.5* 8.3*  MG  --  1.7  --  1.8  --   --   --   PHOS 5.2*  --  6.1*  --  6.6* 7.2* 7.7*   GFR Estimated Creatinine Clearance: 14.3  mL/min (by C-G formula based on Cr of 3.89). Liver Function Tests:  Recent Labs Lab 05/26/15 2254  05/29/15 0224 05/30/15 0413 05/31/15 [id number] 06/01/15 0751 06/02/15 0606  AST 30  --   --  18  --   --   --   ALT 12*  --   --  8*  --   --   --   ALKPHOS 48  --   --  45  --   --   --   BILITOT 0.9  --   --  0.4  --   --   --   PROT 5.6*  --   --  5.8*  --   --   --   ALBUMIN 2.2*  < > 1.9* 1.8* 1.9* 2.0* 2.0*  < > = values in this interval not displayed. No results for input(s): LIPASE, AMYLASE in the last 168 hours. No results for input(s): AMMONIA in the last 168 hours. Coagulation profile  Recent Labs Lab 05/27/15 0142  INR 1.35    CBC:  Recent Labs Lab 05/26/15 2254 05/28/15 0216 05/29/15 0224 05/30/15 0413 05/31/15 0638 06/02/15 0606  WBC 15.5* 11.8* 8.6 6.5 8.9 7.3  NEUTROABS 14.2*  --   --  5.9  --   --   HGB 9.4* 8.3* 8.9* 9.1* 9.9* 9.4*  HCT 30.0* 27.7* 29.2* 29.3* 31.0* 28.7*  MCV 93.5 95.5 92.7 90.2 89.3 87.2  PLT 268 248 300 329 358 298   Cardiac Enzymes:  Recent Labs Lab 05/27/15 0142 05/27/15 0601 05/27/15 1612  TROPONINI 0.06* 0.05* 0.05*   BNP (last 3 results) No results for input(s): PROBNP in the last 8760 hours. CBG:  Recent Labs Lab 06/01/15 1208 06/01/15 1653 06/01/15 2214 06/02/15 0829 06/02/15 1205  GLUCAP 220* 222* 146* 94 134*   D-Dimer: No results for input(s): DDIMER in the last 72 hours. Hgb A1c: No results for input(s): HGBA1C in the last 72 hours. Lipid  Profile: No results for input(s): CHOL, HDL, LDLCALC, TRIG, CHOLHDL, LDLDIRECT in the last 72 hours. Thyroid function studies: No results for input(s): TSH, T4TOTAL, T3FREE, THYROIDAB in the last 72 hours.  Invalid input(s): FREET3 Anemia work up: No results for input(s): VITAMINB12, FOLATE, FERRITIN, TIBC, IRON, RETICCTPCT in the last 72 hours. Sepsis Labs:  Recent Labs Lab 05/26/15 2300 05/27/15 0142 05/27/15 0604  05/29/15 0224 05/30/15 0413 05/31/15 [id number] 06/02/15 0606  PROCALCITON  --  15.23  --   --   --   --   --   --   WBC  --   --   --   < > 8.6 6.5 8.9 7.3  LATICACIDVEN 0.92 0.7 0.7  --   --   --   --   --   < > = values in this interval not displayed. Microbiology Recent Results (from the past 240 hour(s))  Culture, blood (routine x 2)     Status: None   Collection Time: 05/26/15 10:52 PM  Result Value Ref Range Status   Specimen Description BLOOD RIGHT HAND  Final   Special Requests BOTTLES DRAWN AEROBIC AND ANAEROBIC 5CC  Final   Culture NO GROWTH 5 DAYS  Final   Report Status 05/31/2015 FINAL  Final  Culture, blood (routine x 2)     Status: None   Collection Time: 05/26/15 10:53 PM  Result Value Ref Range Status   Specimen Description BLOOD RIGHT FOREARM  Final   Special Requests BOTTLES DRAWN AEROBIC AND ANAEROBIC 5CC  Final   Culture NO GROWTH 5 DAYS  Final   Report Status 05/31/2015 FINAL  Final  Urine culture     Status: None   Collection Time: 05/27/15  6:18 AM  Result Value Ref Range Status   Specimen Description URINE, CATHETERIZED  Final   Special Requests NONE  Final   Culture 2,000 COLONIES/mL INSIGNIFICANT GROWTH  Final   Report Status 05/28/2015 FINAL  Final     Medications:   . amLODipine  10 mg Oral Daily  . arformoterol  15 mcg Nebulization BID  . aspirin  81 mg Oral Daily  . carvedilol  25 mg Oral BID WC  . citalopram  20 mg Oral Daily  . cloNIDine  0.3 mg Oral BID  . dextromethorphan-guaiFENesin  1 tablet Oral BID  . febuxostat   40 mg Oral Daily  . fenofibrate  160 mg Oral Daily  . furosemide  80 mg Oral BID  . heparin  5,000 Units Subcutaneous 3 times per day  . hydrALAZINE  50 mg Oral 3 times per day  . insulin aspart protamine- aspart  55 Units Subcutaneous Q breakfast  . insulin aspart protamine- aspart  55 Units Subcutaneous Q supper  . ipratropium-albuterol  3 mL Nebulization TID  . isosorbide mononitrate  45 mg Oral Daily  . neomycin-bacitracin-polymyxin   Topical Daily  . nystatin cream   Topical BID  . omega-3 acid ethyl esters  1 g Oral Daily  . oxyCODONE-acetaminophen  1 tablet Oral BID   And  . oxyCODONE  5 mg Oral BID  . pantoprazole  40 mg Oral Daily  . predniSONE  40 mg Oral Q breakfast  . rosuvastatin  10 mg Oral Daily   Continuous Infusions:   Time spent: 15 min   LOS: 6 days   Charlynne Cousins  [facility] Pager [phone]  *Please refer to [email or web address], password [email or web address] to get updated schedule on who will round on this patient, as hospitalists switch teams weekly. If 7PM-7AM, please contact night-coverage at [email or web address], password [email or web address] for any overnight needs.  06/02/2015, 1:11 PM

## 2015-06-03 ENCOUNTER — Ambulatory Visit: Payer: Medicare Other | Admitting: Nurse Practitioner

## 2015-06-03 ENCOUNTER — Inpatient Hospital Stay (HOSPITAL_COMMUNITY): Payer: Medicare Other

## 2015-06-03 ENCOUNTER — Telehealth: Payer: Self-pay | Admitting: Nurse Practitioner

## 2015-06-03 DIAGNOSIS — E875 Hyperkalemia: Secondary | ICD-10-CM

## 2015-06-03 DIAGNOSIS — E0843 Diabetes mellitus due to underlying condition with diabetic autonomic (poly)neuropathy: Secondary | ICD-10-CM

## 2015-06-03 DIAGNOSIS — M351 Other overlap syndromes: Secondary | ICD-10-CM

## 2015-06-03 LAB — RENAL FUNCTION PANEL
ALBUMIN: 2.2 g/dL — AB (ref 3.5–5.0)
Anion gap: 17 — ABNORMAL HIGH (ref 5–15)
BUN: 143 mg/dL — AB (ref 6–20)
CALCIUM: 8.2 mg/dL — AB (ref 8.9–10.3)
CO2: 24 mmol/L (ref 22–32)
CREATININE: 3.86 mg/dL — AB (ref 0.44–1.00)
Chloride: 98 mmol/L — ABNORMAL LOW (ref 101–111)
GFR calc Af Amer: 12 mL/min — ABNORMAL LOW (ref >60)
GFR, EST NON AFRICAN AMERICAN: 10 mL/min — AB (ref >60)
Glucose, Bld: 210 mg/dL — ABNORMAL HIGH (ref 65–99)
PHOSPHORUS: 8.3 mg/dL — AB (ref 2.5–4.6)
POTASSIUM: 4.2 mmol/L (ref 3.5–5.1)
Sodium: 139 mmol/L (ref 135–145)

## 2015-06-03 LAB — CBC
HCT: 29.8 % — ABNORMAL LOW (ref 36.0–46.0)
HEMOGLOBIN: 9.5 g/dL — AB (ref 12.0–15.0)
MCH: 28 pg (ref 26.0–34.0)
MCHC: 31.9 g/dL (ref 30.0–36.0)
MCV: 87.9 fL (ref 78.0–100.0)
Platelets: 311 10*3/uL (ref 150–400)
RBC: 3.39 MIL/uL — AB (ref 3.87–5.11)
RDW: 15.8 % — ABNORMAL HIGH (ref 11.5–15.5)
WBC: 6.7 10*3/uL (ref 4.0–10.5)

## 2015-06-03 LAB — PROTIME-INR
INR: 1.24 (ref 0.00–1.49)
PROTHROMBIN TIME: 15.7 s — AB (ref 11.6–15.2)

## 2015-06-03 LAB — GLUCOSE, CAPILLARY
GLUCOSE-CAPILLARY: 164 mg/dL — AB (ref 65–99)
GLUCOSE-CAPILLARY: 166 mg/dL — AB (ref 65–99)
GLUCOSE-CAPILLARY: 297 mg/dL — AB (ref 65–99)
Glucose-Capillary: 171 mg/dL — ABNORMAL HIGH (ref 65–99)

## 2015-06-03 MED ORDER — SODIUM CHLORIDE 0.9 % IV SOLN
INTRAVENOUS | Status: AC | PRN
  Administered 2015-06-03: 10 mL/h via INTRAVENOUS

## 2015-06-03 MED ORDER — HYDRALAZINE HCL 20 MG/ML IJ SOLN
INTRAMUSCULAR | Status: AC | PRN
Start: 2015-06-03 — End: 2015-06-03
  Administered 2015-06-03 (×2): 10 mg via INTRAVENOUS

## 2015-06-03 MED ORDER — HYDRALAZINE HCL 20 MG/ML IJ SOLN
10.0000 mg | Freq: Once | INTRAMUSCULAR | Status: DC
  Filled 2015-06-03: qty 1

## 2015-06-03 MED ORDER — HYDRALAZINE HCL 20 MG/ML IJ SOLN
INTRAMUSCULAR | Status: AC
  Filled 2015-06-03: qty 2

## 2015-06-03 MED ORDER — FENTANYL CITRATE (PF) 100 MCG/2ML IJ SOLN
INTRAMUSCULAR | Status: AC | PRN
  Administered 2015-06-03: 25 ug via INTRAVENOUS

## 2015-06-03 MED ORDER — MIDAZOLAM HCL 2 MG/2ML IJ SOLN
INTRAMUSCULAR | Status: AC | PRN
  Administered 2015-06-03: 1 mg via INTRAVENOUS

## 2015-06-03 MED ORDER — LIDOCAINE HCL (PF) 1 % IJ SOLN
INTRAMUSCULAR | Status: AC
  Filled 2015-06-03: qty 10

## 2015-06-03 MED ORDER — FENTANYL CITRATE (PF) 100 MCG/2ML IJ SOLN
INTRAMUSCULAR | Status: AC
  Filled 2015-06-03: qty 2

## 2015-06-03 MED ORDER — MIDAZOLAM HCL 2 MG/2ML IJ SOLN
INTRAMUSCULAR | Status: AC
  Filled 2015-06-03: qty 2

## 2015-06-03 NOTE — Progress Notes (Signed)
PT Cancellation Note  Patient Details Name: Anna [name] MRN: [id number] DOB: 02/04/1935   Cancelled Treatment:    Reason Eval/Treat Not Completed: Patient not medically ready, currently on bedrest. Will continue to follow as appropriate.    Cassell Clement, PT, CSCS Pager [phone] Office [phone]  06/03/2015, 3:33 PM

## 2015-06-03 NOTE — Consult Note (Signed)
   [facility] CM Inpatient Consult   06/03/2015  CYRINE GLANDON 1934-06-26 [id number]   Patient screened for [place] Management services. Noted patient discharge plan is for SNF. Spoke with inpatient RNCM and inpatient Licensed CSW. Will not engage for [place] Management program at this time.   Marthenia Rolling, MSN-Ed, RN,BSN [facility] Liaison [phone]

## 2015-06-03 NOTE — Progress Notes (Signed)
Notified Katherine NP on call in regards to patient's Diastolic BP being low. Inquired about administering evening antihypertensives. Ordered to continue administering medication. Also blood glucose was 244. No evening coverage. No new orders in regards to blood glucose.

## 2015-06-03 NOTE — Telephone Encounter (Signed)
Left detailed message that handicap form would be ready for pickup anytime tomorrow after noon and to CB with any further questions or concerns.

## 2015-06-03 NOTE — Progress Notes (Signed)
Pt refuses CPAP.  Aware that she can call if she changes her mind.

## 2015-06-03 NOTE — Progress Notes (Signed)
Patient ID: Anna [name], female   DOB: 04-12-1935, 81 y.o.   MRN: [id number]  [facility] Progress Note   Assessment/ Plan:   1. Acute renal failure on chronic kidney disease stage III: (baseline creatinine 1.6-2.0, followed by Dr. Lorrene Reid). Presented with rash over upper and lower extremities, low-grade fever and acute on chronic renal failure prompting consideration of acute vasculitis-studies/serology so far negative. Currently being presumed as having AIN versus ATN-on corticosteroids empirically and kidney biopsy awaited today. He possibly able to leave the hospital tomorrow on corticosteroids if renal function stable/electrolytes per minute as the kidney biopsy will be resulted sometime next week. 2. Healthcare associated pneumonia with left lower lobe infiltrate: Antibiotic therapy now narrowed down to levofloxacin (previously on vancomycin/Zosyn) 3. Acute blood loss anemia status post laparoscopic cholecystectomy: Improved status post blood transfusion, not on ESA at this time. 4. Hypertension: Blood pressures intermittently elevated-furosemide decreased yesterday but blood pressures appear to be fairly acceptable.    Subjective:   She reports to be feeling well and slept well overnight    Objective:   BP 158/39 mmHg  Pulse 65  Temp(Src) 97.9 F (36.6 C) (Oral)  Resp 18  Ht 5\' 4"  (1.626 m)  Wt 106.1 kg (233 lb 14.5 oz)  BMI 40.13 kg/m2  SpO2 97%  Intake/Output Summary (Last 24 hours) at 06/03/15 0834 Last data filed at 06/02/15 1859  Gross per 24 hour  Intake    600 ml  Output      0 ml  Net    600 ml   Weight change: -1.6 kg (-3 lb 8.4 oz)  Physical Exam: Gen: comfortable resting in bed family member at bedside CVS: pulse regular, bradycardic rate, s1 and s2 normal Resp: Decreased breath sounds over bases bilaterally, no rales  [id number], obese, NT Ext: Improving acute rash over upper extremities, almost nonexistent over lower extremities-  improving  over back as well.   Imaging: No results found.  Labs: BMET  Recent Labs Lab 05/28/15 0216 05/29/15 0224 05/30/15 0413 05/31/15 [id number] 06/01/15 0751 06/02/15 0606 06/03/15 0517  NA 138 139 141 141 140 140 139  K 5.3* 4.8 4.6 4.1 3.8 3.8 4.2  CL 110 108 106 107 104 104 98*  CO2 20* 19* 20* 23 23 23 24   GLUCOSE 137* 195* 126* 81 120* 89 210*  BUN 76* 83* 94* 105* 118* 133* 143*  CREATININE 4.20* 4.18* 3.79* 3.88* 3.76* 3.89* 3.86*  CALCIUM 7.6* 8.0* 8.4* 8.3* 8.5* 8.3* 8.2*  PHOS 5.2* 6.1*  --  6.6* 7.2* 7.7* 8.3*   CBC  Recent Labs Lab 05/30/15 0413 05/31/15 0638 06/02/15 0606 06/03/15 0517  WBC 6.5 8.9 7.3 6.7  NEUTROABS 5.9  --   --   --   HGB 9.1* 9.9* 9.4* 9.5*  HCT 29.3* 31.0* 28.7* 29.8*  MCV 90.2 89.3 87.2 87.9  PLT 329 358 298 311    Medications:    . amLODipine  10 mg Oral Daily  . arformoterol  15 mcg Nebulization BID  . aspirin  81 mg Oral Daily  . carvedilol  25 mg Oral BID WC  . citalopram  20 mg Oral Daily  . cloNIDine  0.3 mg Oral BID  . dextromethorphan-guaiFENesin  1 tablet Oral BID  . febuxostat  40 mg Oral Daily  . fenofibrate  160 mg Oral Daily  . furosemide  40 mg Oral BID  . hydrALAZINE  50 mg Oral 3 times per day  . insulin aspart protamine-  aspart  55 Units Subcutaneous Q breakfast  . insulin aspart protamine- aspart  55 Units Subcutaneous Q supper  . ipratropium-albuterol  3 mL Nebulization TID  . isosorbide mononitrate  45 mg Oral Daily  . neomycin-bacitracin-polymyxin   Topical Daily  . nystatin cream   Topical BID  . omega-3 acid ethyl esters  1 g Oral Daily  . oxyCODONE-acetaminophen  1 tablet Oral BID   And  . oxyCODONE  5 mg Oral BID  . pantoprazole  40 mg Oral Daily  . predniSONE  40 mg Oral Q breakfast  . rosuvastatin  10 mg Oral Daily   Elmarie Shiley, MD 06/03/2015, 8:34 AM

## 2015-06-03 NOTE — Progress Notes (Signed)
PT Cancellation Note  Patient Details Name: STARLIN PANIK MRN: [id number] DOB: 09-21-1934   Cancelled Treatment:    Reason Eval/Treat Not Completed: Patient at procedure or test/unavailable. Will follow as able.    Cassell Clement, PT, CSCS Pager [phone] Office [phone]  06/03/2015, 10:46 AM

## 2015-06-03 NOTE — Care Management Important Message (Signed)
Important Message  Patient Details  Name: Anna [name] MRN: [id number] Date of Birth: [birth year]   Medicare Important Message Given:  Yes    Grady Lucci Abena 06/03/2015, 11:07 AM

## 2015-06-03 NOTE — Sedation Documentation (Signed)
Dr. Kathlene Cote notified of blood pressure. Orders for 10mg  Hydralazine given.

## 2015-06-03 NOTE — Progress Notes (Signed)
[facility] PROGRESS NOTE    Progress Note   Anna [name] [id number] DOB: 14-Jan-1935 DOA: 05/26/2015 PCP: Chevis Pretty, FNP   Brief Narrative:   Anna [name] is an 81 y.o. female past medical history of chronic diastolic heart failure, type 2 diabetes mellitus, COPD on oxygen dependent with a recent hospitalization for choledocholithiasis status post laparoscopic cholecystectomy in 05/25/2015, chronic kidney disease stage IV with presents to the ED with fever cough and shortness of breath. Chest x-ray was done that showed a left lower lobe infiltrate he was started empirically on antibiotics. Nephrology was consulted due to his worsening creatinine recommended a skin biopsy performed by surgery on 05/31/2015. Was placed on oral steroids.  Assessment/Plan:   Sepsis due to HCAP (healthcare-associated pneumonia) She was started empirically on IV vancomycin and Zosyn for healthcare associated pneumonia taper to Levaquin she will continue for 1 additional days.  Acute on chronic kidney disease stage IV with a baseline creatinine 1.6 1.2: Renal ultrasound showed no acute abnormalities, duplex was inconclusive, the patient doesn't have proteinuria a right blood cells in urine. Renal consulted who recommended to start empiric steroids and renal biopsy performed on 06/03/2015 check a basic metabolic panel and a hemoglobin in the [name].  Macular rash in upper extremity legs and back/skin vasculitis/ MCTD (mixed connective tissue disease) ([place]): Unclear etiology, patient did miss several appointments with his dermatologist due to recent surgery. With worsening renal function skin biopsy was done which results are pending and he was started empirically on steroids.  COPD: Fairly stable, continue current regimen.  Obstructive sleep apnea: Contingency prep at night.  Acute on chronic diastolic dysfunction with an EF of 50%: Continue on Coreg, lasix hydralazine and  Imdur.  Essential hypertension: Continue Coreg, clonidine Imdur and hydralazine.  Diabetes mellitus, insulin dependent (IDDM), uncontrolled (HCC)/Diabetic neuropathy ([place]): A1c of 6.3 continue insulin 7030.  DVT Prophylaxis - SCD's  Family Communication: none Disposition Plan: Home on friday Code Status:     Code Status Orders        Start     Ordered   05/27/15 0048  Do not attempt resuscitation (DNR)   Continuous    Question Answer Comment  In the event of cardiac or respiratory ARREST Do not call a "code blue"   In the event of cardiac or respiratory ARREST Do not perform Intubation, CPR, defibrillation or ACLS   In the event of cardiac or respiratory ARREST Use medication by any route, position, wound care, and other measures to relive pain and suffering. May use oxygen, suction and manual treatment of airway obstruction as needed for comfort.      05/27/15 0049    Code Status History    Date Active Date Inactive Code Status Order ID Comments User Context   05/25/2015  1:42 PM 05/26/2015 12:18 PM Full Code [id number]  Coralie Keens, MD Inpatient   04/20/2015 10:56 PM 04/25/2015  1:48 PM Full Code [id number]  Gardiner Barefoot, NP Inpatient   04/20/2015  8:06 PM 04/20/2015 10:56 PM DNR [id number]  Samella Parr, NP Inpatient        IV Access:    Peripheral IV   Procedures and diagnostic studies:   US Biopsy  06/03/2015  CLINICAL DATA:  Acute on chronic renal failure. The patient requires renal biopsy. EXAM: ULTRASOUND GUIDED CORE BIOPSY OF LEFT KIDNEY MEDICATIONS: 1.0 mg IV Versed; 25 mcg IV Fentanyl Total Moderate Sedation Time: 10 minutes. The patient's level of consciousness and physiologic status were  continuously monitored during the procedure by Radiology nursing. PROCEDURE: The procedure, risks, benefits, and alternatives were explained to the patient. Questions regarding the procedure were encouraged and answered. The patient understands and consents to  the procedure. A time-out was performed prior to the procedure. The left flank region was prepped with chlorhexidine in a sterile fashion, and a sterile drape was applied covering the operative field. A sterile gown and sterile gloves were used for the procedure. Local anesthesia was provided with 1% Lidocaine. Ultrasound was used to localize the left kidney. Under direct ultrasound guidance, a 16 gauge core biopsy device was advanced. Three separate core biopsy samples were obtained of lower pole cortex. Core samples were submitted in saline. COMPLICATIONS: None. FINDINGS: Preliminary imaging shows much better visualization of the left kidney compared to the right from a posterior approach. Solid tissue was obtained with biopsy. IMPRESSION: Ultrasound-guided core biopsy performed of the left kidney at the level of lower pole cortex. Electronically Signed   By: Aletta Edouard M.D.   On: 06/03/2015 11:57     Medical Consultants:    None.  Anti-Infectives:   Anti-infectives    Start     Dose/Rate Route Frequency Ordered Stop   05/31/15 2300  vancomycin (VANCOCIN) 1,500 mg in sodium chloride 0.9 % 500 mL IVPB  Status:  Discontinued     1,500 mg 250 mL/hr over 120 Minutes Intravenous Every 48 hours 05/30/15 1233 05/31/15 0842   05/31/15 0915  levofloxacin (LEVAQUIN) tablet 500 mg     500 mg Oral Every 48 hours 05/31/15 0903 05/31/15 1058   05/31/15 0845  levofloxacin (LEVAQUIN) tablet 500 mg  Status:  Discontinued     500 mg Oral Every 48 hours 05/31/15 0846 05/31/15 0903   05/28/15 2330  vancomycin (VANCOCIN) 1,500 mg in sodium chloride 0.9 % 500 mL IVPB  Status:  Discontinued     1,500 mg 250 mL/hr over 120 Minutes Intravenous Every 48 hours 05/27/15 0059 05/30/15 1233   05/27/15 0800  piperacillin-tazobactam (ZOSYN) IVPB 2.25 g  Status:  Discontinued     2.25 g 100 mL/hr over 30 Minutes Intravenous Every 8 hours 05/27/15 0059 05/31/15 0842   05/26/15 2315  vancomycin (VANCOCIN) 2,000 mg in  sodium chloride 0.9 % 500 mL IVPB     2,000 mg 250 mL/hr over 120 Minutes Intravenous  Once 05/26/15 2242 05/27/15 0247   05/26/15 2245  piperacillin-tazobactam (ZOSYN) IVPB 3.375 g     3.375 g 100 mL/hr over 30 Minutes Intravenous  Once 05/26/15 2242 05/26/15 2328      Subjective:    [name] with a good appetite today no complaints. Does not complain of pain.  Objective:    Filed Vitals:   06/03/15 1047 06/03/15 1050 06/03/15 1055 06/03/15 1104  BP: 161/45 159/45 156/47 120/50  Pulse: 70 77 70 67  Temp:      TempSrc:      Resp: 18 15 17 17   Height:      Weight:      SpO2: 96% 97% 97% 98%    Intake/Output Summary (Last 24 hours) at 06/03/15 1249 Last data filed at 06/03/15 0720  Gross per 24 hour  Intake    480 ml  Output      0 ml  Net    480 ml   Filed Weights   06/01/15 0500 06/02/15 0651 06/03/15 0700  Weight: 109.7 kg (241 lb 13.5 oz) 107.7 kg (237 lb 7 oz) 106.1 kg (233 lb  14.5 oz)    Exam: Gen:  NAD Cardiovascular:  RRR. Chest and lungs:   CTAB Abdomen:  Abdomen soft, NT/ND, + BS Extremities:  No C/E/C   Data Reviewed:    Labs: Basic Metabolic Panel:  Recent Labs Lab 05/28/15 1225 05/29/15 0224 05/30/15 0413 05/31/15 [id number] 06/01/15 0751 06/02/15 0606 06/03/15 0517  NA  --  139 141 141 140 140 139  K  --  4.8 4.6 4.1 3.8 3.8 4.2  CL  --  108 106 107 104 104 98*  CO2  --  19* 20* 23 23 23 24   GLUCOSE  --  195* 126* 81 120* 89 210*  BUN  --  83* 94* 105* 118* 133* 143*  CREATININE  --  4.18* 3.79* 3.88* 3.76* 3.89* 3.86*  CALCIUM  --  8.0* 8.4* 8.3* 8.5* 8.3* 8.2*  MG 1.7  --  1.8  --   --   --   --   PHOS  --  6.1*  --  6.6* 7.2* 7.7* 8.3*   GFR Estimated Creatinine Clearance: 14.3 mL/min (by C-G formula based on Cr of 3.86). Liver Function Tests:  Recent Labs Lab 05/30/15 0413 05/31/15 [id number] 06/01/15 0751 06/02/15 0606 06/03/15 0517  AST 18  --   --   --   --   ALT 8*  --   --   --   --   ALKPHOS 45  --   --   --    --   BILITOT 0.4  --   --   --   --   PROT 5.8*  --   --   --   --   ALBUMIN 1.8* 1.9* 2.0* 2.0* 2.2*   No results for input(s): LIPASE, AMYLASE in the last 168 hours. No results for input(s): AMMONIA in the last 168 hours. Coagulation profile  Recent Labs Lab 06/03/15 0517  INR 1.24    CBC:  Recent Labs Lab 05/29/15 0224 05/30/15 0413 05/31/15 0638 06/02/15 0606 06/03/15 0517  WBC 8.6 6.5 8.9 7.3 6.7  NEUTROABS  --  5.9  --   --   --   HGB 8.9* 9.1* 9.9* 9.4* 9.5*  HCT 29.2* 29.3* 31.0* 28.7* 29.8*  MCV 92.7 90.2 89.3 87.2 87.9  PLT 300 329 358 298 311   Cardiac Enzymes:  Recent Labs Lab 05/27/15 1612  TROPONINI 0.05*   BNP (last 3 results) No results for input(s): PROBNP in the last 8760 hours. CBG:  Recent Labs Lab 06/02/15 1205 06/02/15 1704 06/02/15 2131 06/03/15 0752 06/03/15 1218  GLUCAP 134* 274* 244* 164* 166*   D-Dimer: No results for input(s): DDIMER in the last 72 hours. Hgb A1c: No results for input(s): HGBA1C in the last 72 hours. Lipid Profile: No results for input(s): CHOL, HDL, LDLCALC, TRIG, CHOLHDL, LDLDIRECT in the last 72 hours. Thyroid function studies: No results for input(s): TSH, T4TOTAL, T3FREE, THYROIDAB in the last 72 hours.  Invalid input(s): FREET3 Anemia work up: No results for input(s): VITAMINB12, FOLATE, FERRITIN, TIBC, IRON, RETICCTPCT in the last 72 hours. Sepsis Labs:  Recent Labs Lab 05/30/15 0413 05/31/15 [id number] 06/02/15 0606 06/03/15 0517  WBC 6.5 8.9 7.3 6.7   Microbiology Recent Results (from the past 240 hour(s))  Culture, blood (routine x 2)     Status: None   Collection Time: 05/26/15 10:52 PM  Result Value Ref Range Status   Specimen Description BLOOD RIGHT HAND  Final   Special Requests BOTTLES DRAWN AEROBIC AND ANAEROBIC  5CC  Final   Culture NO GROWTH 5 DAYS  Final   Report Status 05/31/2015 FINAL  Final  Culture, blood (routine x 2)     Status: None   Collection Time: 05/26/15 10:53 PM    Result Value Ref Range Status   Specimen Description BLOOD RIGHT FOREARM  Final   Special Requests BOTTLES DRAWN AEROBIC AND ANAEROBIC 5CC  Final   Culture NO GROWTH 5 DAYS  Final   Report Status 05/31/2015 FINAL  Final  Urine culture     Status: None   Collection Time: 05/27/15  6:18 AM  Result Value Ref Range Status   Specimen Description URINE, CATHETERIZED  Final   Special Requests NONE  Final   Culture 2,000 COLONIES/mL INSIGNIFICANT GROWTH  Final   Report Status 05/28/2015 FINAL  Final     Medications:   . amLODipine  10 mg Oral Daily  . arformoterol  15 mcg Nebulization BID  . aspirin  81 mg Oral Daily  . carvedilol  25 mg Oral BID WC  . citalopram  20 mg Oral Daily  . cloNIDine  0.3 mg Oral BID  . dextromethorphan-guaiFENesin  1 tablet Oral BID  . febuxostat  40 mg Oral Daily  . fenofibrate  160 mg Oral Daily  . fentaNYL      . furosemide  40 mg Oral BID  . hydrALAZINE      . hydrALAZINE  10 mg Intravenous Once  . hydrALAZINE  50 mg Oral 3 times per day  . insulin aspart protamine- aspart  55 Units Subcutaneous Q breakfast  . insulin aspart protamine- aspart  55 Units Subcutaneous Q supper  . ipratropium-albuterol  3 mL Nebulization TID  . isosorbide mononitrate  45 mg Oral Daily  . lidocaine (PF)      . midazolam      . neomycin-bacitracin-polymyxin   Topical Daily  . nystatin cream   Topical BID  . omega-3 acid ethyl esters  1 g Oral Daily  . oxyCODONE-acetaminophen  1 tablet Oral BID   And  . oxyCODONE  5 mg Oral BID  . pantoprazole  40 mg Oral Daily  . predniSONE  40 mg Oral Q breakfast  . rosuvastatin  10 mg Oral Daily   Continuous Infusions:   Time spent: 15 min   LOS: 7 days   Charlynne Cousins  [facility] Pager [phone]  *Please refer to [email or web address], password [email or web address] to get updated schedule on who will round on this patient, as hospitalists switch teams weekly. If 7PM-7AM, please contact night-coverage at [email or web address], password [email or web address]  for any overnight needs.  06/03/2015, 12:49 PM

## 2015-06-03 NOTE — Procedures (Signed)
Interventional Radiology Procedure Note  Procedure:  Ultrasound guided renal biopsy  Complications:  None  Estimated Blood Loss: < 25 mL  16 G core biopsy x 3 of left lower pole cortex.    Venetia Night. Kathlene Cote, M.D Pager:  [phone]

## 2015-06-04 DIAGNOSIS — A4189 Other specified sepsis: Secondary | ICD-10-CM | POA: Diagnosis not present

## 2015-06-04 DIAGNOSIS — N184 Chronic kidney disease, stage 4 (severe): Secondary | ICD-10-CM | POA: Diagnosis not present

## 2015-06-04 DIAGNOSIS — E785 Hyperlipidemia, unspecified: Secondary | ICD-10-CM | POA: Diagnosis not present

## 2015-06-04 DIAGNOSIS — G4733 Obstructive sleep apnea (adult) (pediatric): Secondary | ICD-10-CM | POA: Diagnosis not present

## 2015-06-04 DIAGNOSIS — M1000 Idiopathic gout, unspecified site: Secondary | ICD-10-CM | POA: Diagnosis not present

## 2015-06-04 DIAGNOSIS — K219 Gastro-esophageal reflux disease without esophagitis: Secondary | ICD-10-CM | POA: Diagnosis not present

## 2015-06-04 DIAGNOSIS — R079 Chest pain, unspecified: Secondary | ICD-10-CM | POA: Diagnosis not present

## 2015-06-04 DIAGNOSIS — J439 Emphysema, unspecified: Secondary | ICD-10-CM | POA: Diagnosis not present

## 2015-06-04 DIAGNOSIS — A419 Sepsis, unspecified organism: Secondary | ICD-10-CM | POA: Diagnosis not present

## 2015-06-04 DIAGNOSIS — N179 Acute kidney failure, unspecified: Secondary | ICD-10-CM | POA: Diagnosis not present

## 2015-06-04 DIAGNOSIS — K8001 Calculus of gallbladder with acute cholecystitis with obstruction: Secondary | ICD-10-CM | POA: Diagnosis not present

## 2015-06-04 DIAGNOSIS — N189 Chronic kidney disease, unspecified: Secondary | ICD-10-CM | POA: Diagnosis not present

## 2015-06-04 DIAGNOSIS — D510 Vitamin B12 deficiency anemia due to intrinsic factor deficiency: Secondary | ICD-10-CM | POA: Diagnosis not present

## 2015-06-04 DIAGNOSIS — M1991 Primary osteoarthritis, unspecified site: Secondary | ICD-10-CM | POA: Diagnosis not present

## 2015-06-04 DIAGNOSIS — E119 Type 2 diabetes mellitus without complications: Secondary | ICD-10-CM | POA: Diagnosis not present

## 2015-06-04 DIAGNOSIS — D631 Anemia in chronic kidney disease: Secondary | ICD-10-CM | POA: Diagnosis not present

## 2015-06-04 DIAGNOSIS — D501 Sideropenic dysphagia: Secondary | ICD-10-CM | POA: Diagnosis not present

## 2015-06-04 DIAGNOSIS — J441 Chronic obstructive pulmonary disease with (acute) exacerbation: Secondary | ICD-10-CM | POA: Diagnosis not present

## 2015-06-04 DIAGNOSIS — I5032 Chronic diastolic (congestive) heart failure: Secondary | ICD-10-CM | POA: Diagnosis not present

## 2015-06-04 DIAGNOSIS — G2581 Restless legs syndrome: Secondary | ICD-10-CM | POA: Diagnosis not present

## 2015-06-04 DIAGNOSIS — R0602 Shortness of breath: Secondary | ICD-10-CM | POA: Diagnosis not present

## 2015-06-04 DIAGNOSIS — E1129 Type 2 diabetes mellitus with other diabetic kidney complication: Secondary | ICD-10-CM | POA: Diagnosis not present

## 2015-06-04 DIAGNOSIS — K8511 Biliary acute pancreatitis with uninfected necrosis: Secondary | ICD-10-CM | POA: Diagnosis not present

## 2015-06-04 DIAGNOSIS — I120 Hypertensive chronic kidney disease with stage 5 chronic kidney disease or end stage renal disease: Secondary | ICD-10-CM | POA: Diagnosis not present

## 2015-06-04 DIAGNOSIS — J17 Pneumonia in diseases classified elsewhere: Secondary | ICD-10-CM | POA: Diagnosis not present

## 2015-06-04 DIAGNOSIS — M351 Other overlap syndromes: Secondary | ICD-10-CM | POA: Diagnosis not present

## 2015-06-04 DIAGNOSIS — J189 Pneumonia, unspecified organism: Secondary | ICD-10-CM | POA: Diagnosis not present

## 2015-06-04 DIAGNOSIS — I10 Essential (primary) hypertension: Secondary | ICD-10-CM | POA: Diagnosis not present

## 2015-06-04 DIAGNOSIS — Z86010 Personal history of colonic polyps: Secondary | ICD-10-CM | POA: Diagnosis not present

## 2015-06-04 DIAGNOSIS — E1140 Type 2 diabetes mellitus with diabetic neuropathy, unspecified: Secondary | ICD-10-CM | POA: Diagnosis not present

## 2015-06-04 LAB — RENAL FUNCTION PANEL
ALBUMIN: 2.3 g/dL — AB (ref 3.5–5.0)
ANION GAP: 19 — AB (ref 5–15)
BUN: 153 mg/dL — ABNORMAL HIGH (ref 6–20)
CHLORIDE: 99 mmol/L — AB (ref 101–111)
CO2: 24 mmol/L (ref 22–32)
Calcium: 8.5 mg/dL — ABNORMAL LOW (ref 8.9–10.3)
Creatinine, Ser: 3.76 mg/dL — ABNORMAL HIGH (ref 0.44–1.00)
GFR calc Af Amer: 12 mL/min — ABNORMAL LOW (ref >60)
GFR calc non Af Amer: 11 mL/min — ABNORMAL LOW (ref >60)
GLUCOSE: 69 mg/dL (ref 65–99)
PHOSPHORUS: 7.7 mg/dL — AB (ref 2.5–4.6)
POTASSIUM: 3.9 mmol/L (ref 3.5–5.1)
Sodium: 142 mmol/L (ref 135–145)

## 2015-06-04 LAB — GLUCOSE, CAPILLARY
GLUCOSE-CAPILLARY: 151 mg/dL — AB (ref 65–99)
Glucose-Capillary: 73 mg/dL (ref 65–99)
Glucose-Capillary: 146 mg/dL — ABNORMAL HIGH (ref 65–99)

## 2015-06-04 MED ORDER — FUROSEMIDE 40 MG PO TABS: 40.0000 mg | ORAL_TABLET | Freq: Every day | ORAL | Status: DC

## 2015-06-04 MED ORDER — PREDNISONE 20 MG PO TABS: 40.0000 mg | ORAL_TABLET | Freq: Every day | ORAL | Status: DC

## 2015-06-04 MED ORDER — HYDRALAZINE HCL 50 MG PO TABS: 50.0000 mg | ORAL_TABLET | Freq: Three times a day (TID) | ORAL | Status: DC

## 2015-06-04 MED ORDER — AMLODIPINE BESYLATE 10 MG PO TABS: 10.0000 mg | ORAL_TABLET | Freq: Every day | ORAL | Status: DC

## 2015-06-04 NOTE — Discharge Summary (Signed)
Physician Discharge Summary  Anna [name] [id number] DOB: 1934-10-12 DOA: 05/26/2015  PCP: Chevis Pretty, FNP  Admit date: 05/26/2015 Discharge date: 06/04/2015  Time spent: 35 minutes  Recommendations for Outpatient Follow-up:  1. Follow-up with nephrology as an outpatient in 2-3 weeks. 2. Continue to take steroids, check a basic metabolic panel in 2 weeks. 3. She will go to the [facility] skilled rehabilitation   Discharge Diagnoses:  Principal Problem:   HCAP (healthcare-associated pneumonia) Active Problems:   Diabetes mellitus, insulin dependent (IDDM), uncontrolled ([place])   Hypertension   Hyperlipidemia   COPD (chronic obstructive pulmonary disease) (HCC)   Diabetic neuropathy (HCC)   GERD (gastroesophageal reflux disease)   Chronic pain syndrome   Anemia, iron deficiency   Acute renal failure superimposed on stage 4 chronic kidney disease (HCC)   Symptomatic cholelithiasis   Sepsis (HCC)   Rash   Depression with anxiety   Gout   Diastolic CHF (HCC)   Pneumonia   Chronic diastolic congestive heart failure (HCC)   Diabetes mellitus, insulin dependent (IDDM), controlled (HCC)   Maculopapular rash, generalized   Hyperkalemia   MCTD (mixed connective tissue disease) ([place])   Vasculitis of skin   Depression   Anxiety state   Acute kidney injury superimposed on chronic kidney disease (HCC)   Chronic diastolic CHF (congestive heart failure) ([place])   Chronic obstructive pulmonary disease ([place])   Sepsis due to pneumonia [facility])   Discharge Condition: stable  Diet recommendation: renal  Filed Weights   06/02/15 0651 06/03/15 0700 06/04/15 0600  Weight: 107.7 kg (237 lb 7 oz) 106.1 kg (233 lb 14.5 oz) 103.1 kg (227 lb 4.7 oz)    History of present illness:  M.D. 81-year-old past history of chronic diastolic heart failure type 2 diabetes mellitus COPD oxygen dependent with a recent hospitalization for choledocholithiasis status post cholecystectomy in  05/28/2015, chronic kidney disease stage IV presents to the ED with fever cough and shortness of breath. Chest x-ray was done that showed left lower lobe infiltrate.  Hospital Course:  Sepsis due to healthcare associated pneumonia: She was started empirically on IV vancomycin and Zosyn she defervesced her white cell count improved. She was D escalated to Levaquin and she finished her course of antibiotics in house.  Acute on chronic kidney see stage IV with a baseline creatinine 1.2-1.6: Renal ultrasound was done that showed no acute abnormalities to Plavix was inconclusive. Renal was consulted who recommended a renal biopsy performed on 06/03/2015 and she was started empirically on steroids. She will follow-up with renal as an outpatient for biopsy results. Skilled nursing rehabilitation on prednisone 40 mg daily.  Macular rash in upper extremity legs and back/skin vasculitis/ MCTD (mixed connective tissue disease) ([place]): Unclear etiology, patient did miss several appointments with his dermatologist due to recent surgery. With worsening renal function skin biopsy was done which results were inconclusive and he was started empirically on steroids, renal biopsy is pending.  COPD: Stable.  Obstructive sleep apnea: Will need a sleep study as an outpatient.  Acute on chronic diastolic dysfunction with an EF of 50%: Continue on Coreg, lasix hydralazine and Imdur.  Essential hypertension: Continue Coreg, clonidine Imdur and hydralazine.  Diabetes mellitus, insulin dependent (IDDM), uncontrolled (HCC)/Diabetic neuropathy ([place]): A1c of 6.3 continue insulin 7030.  DVT Prophylaxis - SCD's  Procedures:  Renal ultrasound  Renal biopsy  Ultrasound duplex of the renal arteries  Consultations:  Renal  Discharge Exam: Filed Vitals:   06/04/15 0554 06/04/15 0900  BP: 154/38  146/39  Pulse: 57 69  Temp: 93.7 F (34.3 C)   Resp: 17     General: A&O x3 Cardiovascular:  RRR Respiratory: good air movement CTA B/L  Discharge Instructions   Discharge Instructions    Diet - low sodium heart healthy    Complete by:  As directed      Increase activity slowly    Complete by:  As directed           Current Discharge Medication List    START taking these medications   Details  !! amLODipine (NORVASC) 10 MG tablet Take 1 tablet (10 mg total) by mouth daily. Qty: 30 tablet, Refills: 0    hydrALAZINE (APRESOLINE) 50 MG tablet Take 1 tablet (50 mg total) by mouth every 8 (eight) hours. Qty: 30 tablet, Refills: 0    predniSONE (DELTASONE) 20 MG tablet Take 2 tablets (40 mg total) by mouth daily with breakfast. Qty: 30 tablet, Refills: 0     !! - Potential duplicate medications found. Please discuss with provider.    CONTINUE these medications which have NOT CHANGED   Details  acetaminophen (TYLENOL) 650 MG CR tablet Take 1,300 mg by mouth at bedtime.    Alcohol Swabs (PHARMACIST CHOICE ALCOHOL) PADS USE 2 TIMES DAILY FOR DIABETIC TESTING Qty: 200 each, Refills: 2    !! amLODipine (NORVASC) 10 MG tablet Take 1 tablet (10 mg total) by mouth daily. Qty: 30 tablet, Refills: 0   Associated Diagnoses: Essential hypertension    aspirin 81 MG tablet Take 81 mg by mouth daily.      budesonide-formoterol (SYMBICORT) 80-4.5 MCG/ACT inhaler Inhale 1 puff into the lungs 2 (two) times daily. Qty: 1 Inhaler, Refills: 5   Associated Diagnoses: Simple chronic bronchitis (HCC)    carvedilol (COREG) 25 MG tablet TAKE 1 TABLET BY MOUTH TWICE DAILY WITH A MEAL Qty: 60 tablet, Refills: 5    citalopram (CELEXA) 20 MG tablet TAKE 1 TABLET BY MOUTH EVERY DAY Qty: 30 tablet, Refills: 5    cloNIDine (CATAPRES) 0.2 MG tablet Take 1 tablet (0.2 mg total) by mouth 3 (three) times daily. Qty: 90 tablet, Refills: 5    EASY TOUCH INSULIN SYRINGE 31G X 5/16" 1 ML MISC USE TO INJECT INSULIN TWICE DAILY Qty: 100 each, Refills: 2    EASY TOUCH PEN NEEDLES 31G X 8 MM MISC USE  TWICE DAILY Qty: 100 each, Refills: 3    febuxostat (ULORIC) 40 MG tablet Take 1 tablet (40 mg total) by mouth daily. Qty: 30 tablet, Refills: 3    fenofibrate (TRICOR) 145 MG tablet Take 1 tablet (145 mg total) by mouth daily. Qty: 30 tablet, Refills: 5   Associated Diagnoses: Hyperlipidemia    Ferrous Sulfate (IRON) 325 (65 FE) MG TABS Take 1 tablet by mouth daily. Qty: 30 each, Refills: 0    furosemide (LASIX) 40 MG tablet Take 1 tablet (40 mg total) by mouth daily. Qty: 30 tablet, Refills: 0    gabapentin (NEURONTIN) 300 MG capsule TAKE 1 CAPSULE BY MOUTH FOUR TIMES A DAY Qty: 120 capsule, Refills: 1    !! glucose blood (ACCU-CHEK AVIVA PLUS) test strip Test 4-5 x a day and prn dx E11.9 Qty: 450 each, Refills: 12    !! glucose blood (ONETOUCH VERIO) test strip Test 4x a day and prn  E11.9 Qty: 300 each, Refills: 12    !! glucose blood test strip Test 4x a day and prn  Dx E11.9 Qty: 300 each, Refills: 12  Insulin Lispro Prot & Lispro (HUMALOG MIX 75/25 KWIKPEN) (75-25) 100 UNIT/ML Kwikpen 55 u in am and 65u in pm Qty: 12 pen, Refills: 11   Associated Diagnoses: Diabetes mellitus type 2, insulin dependent (HCC)    isosorbide mononitrate (IMDUR) 30 MG 24 hr tablet Take 1 tablet (30 mg total) by mouth daily. Qty: 30 tablet, Refills: 0    Omega-3 Fatty Acids (FISH OIL) 1000 MG CAPS Take 1 capsule by mouth daily.    omeprazole (PRILOSEC) 40 MG capsule TAKE 1 CAPSULE BY MOUTH EVERY DAY Qty: 30 capsule, Refills: 5   Associated Diagnoses: Gastroesophageal reflux disease without esophagitis    oxyCODONE-acetaminophen (PERCOCET) 10-325 MG tablet Take 1 tablet by mouth 2 (two) times daily. Qty: 60 tablet, Refills: 0    polyethylene glycol (MIRALAX / GLYCOLAX) packet Take 17 g by mouth daily. And an additional packet if needed    rosuvastatin (CRESTOR) 20 MG tablet TAKE 1 TABLET BY MOUTH EVERY DAY Qty: 30 tablet, Refills: 3     !! - Potential duplicate medications found.  Please discuss with provider.    STOP taking these medications     nystatin cream (MYCOSTATIN)      PHARMACIST CHOICE LANCETS MISC      oxyCODONE (OXY IR/ROXICODONE) 5 MG immediate release tablet      glimepiride (AMARYL) 4 MG tablet        Allergies  Allergen Reactions  . Ace Inhibitors Other (See Comments)    Kidney failure  . Niaspan [Niacin Er] Other (See Comments)    flushing  . Other Rash    Pt started Norvasc, clonidine, and isosorbide on 04-26-15.  Not sure which one is causing the rash.  Pls update after pt goes to [facility] today      The results of significant diagnostics from this hospitalization (including imaging, microbiology, ancillary and laboratory) are listed below for reference.    Significant Diagnostic Studies: Dg Chest 2 View  05/31/2015  CLINICAL DATA:  Shortness of Breath EXAM: CHEST  2 VIEW COMPARISON:  05/26/2015 FINDINGS: Cardiomediastinal silhouette is stable. Persistent left small pleural effusion with left basilar atelectasis or infiltrate. New linear atelectasis or early infiltrate in lingula. Right lung is clear. No pulmonary edema. IMPRESSION: Persistent left small pleural effusion with left basilar atelectasis or infiltrate. New linear atelectasis or early infiltrate in lingula. Right lung is clear. Electronically Signed   By: Lahoma Crocker M.D.   On: 05/31/2015 09:55   Dg Chest 2 View  05/26/2015  CLINICAL DATA:  Fever and hypoxia. EXAM: CHEST  2 VIEW COMPARISON:  04/23/2015 FINDINGS: The cardiac silhouette is enlarged. Mediastinal contours appear intact. Torturous knee and atherosclerotic disease of the aorta are seen. There is no evidence of pneumothorax. Low lung volumes. Mild pulmonary vascular congestion. There is a patchy left lower lobe airspace consolidation. There is a probable small left pleural effusion. Osseous structures are without acute abnormality. Soft tissues are grossly normal. IMPRESSION: Low lung volumes with patchy left lower lobe  airspace consolidation and small left pleural effusion, which may represent developing pneumonia. Mild pulmonary vascular congestion. Electronically Signed   By: Fidela Salisbury M.D.   On: 05/26/2015 22:52   US Renal  05/27/2015  CLINICAL DATA:  Acute kidney injury. Bilateral renal atrophy seen on previous CT and ultrasound examinations. EXAM: RENAL / URINARY TRACT ULTRASOUND COMPLETE COMPARISON:  CT abdomen and pelvis 04/20/2015 FINDINGS: Right Kidney: Length: 12.6 cm. Mild diffuse parenchymal thinning. No solid mass or hydronephrosis. Left Kidney: Left kidney  is not visualized due to overlying bowel gas. Unable to reposition patient for better visualization. Bladder: No bladder wall thickening or intraluminal filling defects. IMPRESSION: Mild diffuse parenchymal atrophy in the right kidney. Left kidney is not visualized due to bowel gas. Electronically Signed   By: Lucienne Capers M.D.   On: 05/27/2015 02:17   US Biopsy  06/03/2015  CLINICAL DATA:  Acute on chronic renal failure. The patient requires renal biopsy. EXAM: ULTRASOUND GUIDED CORE BIOPSY OF LEFT KIDNEY MEDICATIONS: 1.0 mg IV Versed; 25 mcg IV Fentanyl Total Moderate Sedation Time: 10 minutes. The patient's level of consciousness and physiologic status were continuously monitored during the procedure by Radiology nursing. PROCEDURE: The procedure, risks, benefits, and alternatives were explained to the patient. Questions regarding the procedure were encouraged and answered. The patient understands and consents to the procedure. A time-out was performed prior to the procedure. The left flank region was prepped with chlorhexidine in a sterile fashion, and a sterile drape was applied covering the operative field. A sterile gown and sterile gloves were used for the procedure. Local anesthesia was provided with 1% Lidocaine. Ultrasound was used to localize the left kidney. Under direct ultrasound guidance, a 16 gauge core biopsy device was  advanced. Three separate core biopsy samples were obtained of lower pole cortex. Core samples were submitted in saline. COMPLICATIONS: None. FINDINGS: Preliminary imaging shows much better visualization of the left kidney compared to the right from a posterior approach. Solid tissue was obtained with biopsy. IMPRESSION: Ultrasound-guided core biopsy performed of the left kidney at the level of lower pole cortex. Electronically Signed   By: Aletta Edouard M.D.   On: 06/03/2015 11:57    Microbiology: Recent Results (from the past 240 hour(s))  Culture, blood (routine x 2)     Status: None   Collection Time: 05/26/15 10:52 PM  Result Value Ref Range Status   Specimen Description BLOOD RIGHT HAND  Final   Special Requests BOTTLES DRAWN AEROBIC AND ANAEROBIC 5CC  Final   Culture NO GROWTH 5 DAYS  Final   Report Status 05/31/2015 FINAL  Final  Culture, blood (routine x 2)     Status: None   Collection Time: 05/26/15 10:53 PM  Result Value Ref Range Status   Specimen Description BLOOD RIGHT FOREARM  Final   Special Requests BOTTLES DRAWN AEROBIC AND ANAEROBIC 5CC  Final   Culture NO GROWTH 5 DAYS  Final   Report Status 05/31/2015 FINAL  Final  Urine culture     Status: None   Collection Time: 05/27/15  6:18 AM  Result Value Ref Range Status   Specimen Description URINE, CATHETERIZED  Final   Special Requests NONE  Final   Culture 2,000 COLONIES/mL INSIGNIFICANT GROWTH  Final   Report Status 05/28/2015 FINAL  Final     Labs: Basic Metabolic Panel:  Recent Labs Lab 05/28/15 1225  05/30/15 0413 05/31/15 [id number] 06/01/15 0751 06/02/15 0606 06/03/15 0517 06/04/15 0613  NA  --   < > 141 141 140 140 139 142  K  --   < > 4.6 4.1 3.8 3.8 4.2 3.9  CL  --   < > 106 107 104 104 98* 99*  CO2  --   < > 20* 23 23 23 24 24   GLUCOSE  --   < > 126* 81 120* 89 210* 69  BUN  --   < > 94* 105* 118* 133* 143* 153*  CREATININE  --   < > 3.79* 3.88*  3.76* 3.89* 3.86* 3.76*  CALCIUM  --   < > 8.4* 8.3*  8.5* 8.3* 8.2* 8.5*  MG 1.7  --  1.8  --   --   --   --   --   PHOS  --   < >  --  6.6* 7.2* 7.7* 8.3* 7.7*  < > = values in this interval not displayed. Liver Function Tests:  Recent Labs Lab 05/30/15 0413 05/31/15 [id number] 06/01/15 0751 06/02/15 0606 06/03/15 0517 06/04/15 0613  AST 18  --   --   --   --   --   ALT 8*  --   --   --   --   --   ALKPHOS 45  --   --   --   --   --   BILITOT 0.4  --   --   --   --   --   PROT 5.8*  --   --   --   --   --   ALBUMIN 1.8* 1.9* 2.0* 2.0* 2.2* 2.3*   No results for input(s): LIPASE, AMYLASE in the last 168 hours. No results for input(s): AMMONIA in the last 168 hours. CBC:  Recent Labs Lab 05/29/15 0224 05/30/15 0413 05/31/15 [id number] 06/02/15 0606 06/03/15 0517  WBC 8.6 6.5 8.9 7.3 6.7  NEUTROABS  --  5.9  --   --   --   HGB 8.9* 9.1* 9.9* 9.4* 9.5*  HCT 29.2* 29.3* 31.0* 28.7* 29.8*  MCV 92.7 90.2 89.3 87.2 87.9  PLT 300 329 358 298 311   Cardiac Enzymes: No results for input(s): CKTOTAL, CKMB, CKMBINDEX, TROPONINI in the last 168 hours. BNP: BNP (last 3 results)  Recent Labs  04/23/15 0008 05/27/15 0602  BNP 298.3* 376.7*    ProBNP (last 3 results) No results for input(s): PROBNP in the last 8760 hours.  CBG:  Recent Labs Lab 06/03/15 1218 06/03/15 1720 06/03/15 2102 06/04/15 0827 06/04/15 1046  GLUCAP 166* 297* 171* 73 146*     Signed:  Charlynne Cousins MD.  [facility] 06/04/2015, 10:56 AM

## 2015-06-04 NOTE — Progress Notes (Signed)
Patient ID: Anna [name], female   DOB: [birth year], 81 y.o.   MRN: [id number]  [facility] Progress Note   Assessment/ Plan:   1. Acute renal failure on chronic kidney disease stage III: (baseline creatinine 1.6-2.0, followed by Dr. Lorrene [name]). With petechial rash on admission but ANCA negative. Currently being presumed as having AIN versus ATN-on corticosteroids empirically awaiting kidney biopsy results. DC on prednisone 40mg  and lowered lasix dose of 40mg . No acute HD needs.. 2. Healthcare associated pneumonia with left lower lobe infiltrate: Antibiotic therapy now narrowed down to levofloxacin (previously on vancomycin/Zosyn) 3. Acute blood loss anemia status post laparoscopic cholecystectomy: Improved status post blood transfusion, not on ESA at this time. 4. Hypertension: Blood pressures intermittently elevated-monitor on current regime.    Subjective:   She reports to be feeling well but poor sleep overnight- no hematuria post biopsy.     Objective:   BP 154/38 mmHg  Pulse 57  Temp(Src) 93.7 F (34.3 C) (Axillary)  Resp 17  Ht 5\' 4"  (1.626 m)  Wt 103.1 kg (227 lb 4.7 oz)  BMI 39.00 kg/m2  SpO2 93%  Intake/Output Summary (Last 24 hours) at 06/04/15 [id number] Last data filed at 06/04/15 0700  Gross per 24 hour  Intake    360 ml  Output    450 ml  Net    -90 ml   Weight change: -3 kg (-6 lb 9.8 oz)  Physical Exam: Gen: comfortable resting in bed family members at bedside CVS: pulse regular, bradycardic rate, s1 and s2 normal Resp: Decreased breath sounds over bases bilaterally, no rales  [id number], obese, NT Ext: Improving acute rash over upper extremities, almost nonexistent over lower extremities-  improving over back as well.   Imaging: US Biopsy  06/03/2015  CLINICAL DATA:  Acute on chronic renal failure. The patient requires renal biopsy. EXAM: ULTRASOUND GUIDED CORE BIOPSY OF LEFT KIDNEY MEDICATIONS: 1.0 mg IV Versed; 25 mcg IV Fentanyl Total Moderate  Sedation Time: 10 minutes. The patient's level of consciousness and physiologic status were continuously monitored during the procedure by Radiology nursing. PROCEDURE: The procedure, risks, benefits, and alternatives were explained to the patient. Questions regarding the procedure were encouraged and answered. The patient understands and consents to the procedure. A time-out was performed prior to the procedure. The left flank region was prepped with chlorhexidine in a sterile fashion, and a sterile drape was applied covering the operative field. A sterile gown and sterile gloves were used for the procedure. Local anesthesia was provided with 1% Lidocaine. Ultrasound was used to localize the left kidney. Under direct ultrasound guidance, a 16 gauge core biopsy device was advanced. Three separate core biopsy samples were obtained of lower pole cortex. Core samples were submitted in saline. COMPLICATIONS: None. FINDINGS: Preliminary imaging shows much better visualization of the left kidney compared to the right from a posterior approach. Solid tissue was obtained with biopsy. IMPRESSION: Ultrasound-guided core biopsy performed of the left kidney at the level of lower pole cortex. Electronically Signed   By: Aletta Edouard M.D.   On: 06/03/2015 11:57    Labs: BMET  Recent Labs Lab 05/29/15 0224 05/30/15 0413 05/31/15 [id number] 06/01/15 0751 06/02/15 0606 06/03/15 0517 06/04/15 0613  NA 139 141 141 140 140 139 142  K 4.8 4.6 4.1 3.8 3.8 4.2 3.9  CL 108 106 107 104 104 98* 99*  CO2 19* 20* 23 23 23 24 24   GLUCOSE 195* 126* 81 120* 89 210* 69  BUN 83* 94*  105* 118* 133* 143* 153*  CREATININE 4.18* 3.79* 3.88* 3.76* 3.89* 3.86* 3.76*  CALCIUM 8.0* 8.4* 8.3* 8.5* 8.3* 8.2* 8.5*  PHOS 6.1*  --  6.6* 7.2* 7.7* 8.3* 7.7*   CBC  Recent Labs Lab 05/30/15 0413 05/31/15 0638 06/02/15 0606 06/03/15 0517  WBC 6.5 8.9 7.3 6.7  NEUTROABS 5.9  --   --   --   HGB 9.1* 9.9* 9.4* 9.5*  HCT 29.3* 31.0*  28.7* 29.8*  MCV 90.2 89.3 87.2 87.9  PLT 329 358 298 311    Medications:    . amLODipine  10 mg Oral Daily  . arformoterol  15 mcg Nebulization BID  . aspirin  81 mg Oral Daily  . carvedilol  25 mg Oral BID WC  . citalopram  20 mg Oral Daily  . cloNIDine  0.3 mg Oral BID  . dextromethorphan-guaiFENesin  1 tablet Oral BID  . febuxostat  40 mg Oral Daily  . fenofibrate  160 mg Oral Daily  . [START ON 06/05/2015] furosemide  40 mg Oral Daily  . hydrALAZINE  10 mg Intravenous Once  . hydrALAZINE  50 mg Oral 3 times per day  . insulin aspart protamine- aspart  55 Units Subcutaneous Q breakfast  . insulin aspart protamine- aspart  55 Units Subcutaneous Q supper  . ipratropium-albuterol  3 mL Nebulization TID  . isosorbide mononitrate  45 mg Oral Daily  . neomycin-bacitracin-polymyxin   Topical Daily  . nystatin cream   Topical BID  . omega-3 acid ethyl esters  1 g Oral Daily  . oxyCODONE-acetaminophen  1 tablet Oral BID   And  . oxyCODONE  5 mg Oral BID  . pantoprazole  40 mg Oral Daily  . predniSONE  40 mg Oral Q breakfast  . rosuvastatin  10 mg Oral Daily   Elmarie Shiley, MD 06/04/2015, 9:03 AM

## 2015-06-04 NOTE — Progress Notes (Signed)
Report called to Margreta Journey, nurse that will be receiving pt at [facility]. All questions answered. Pt will be transported to facility by family vehicle. Nursing staff will assist pt to vehicle.

## 2015-06-04 NOTE — Progress Notes (Signed)
Patient will DC to: [facility] Anticipated DC date: 06/04/15 Family notified: Daughter, Rip Harbour Transport by: Car  CSW signing off.  Cedric Fishman, [place] Social Worker [phone]

## 2015-06-04 NOTE — Clinical Social Work Placement (Signed)
   CLINICAL SOCIAL WORK PLACEMENT  NOTE  Date:  06/04/2015  Patient Details  Name: Anna [name] MRN: [id number] Date of Birth: June 04, 1934  Clinical Social Work is seeking post-discharge placement for this patient at the [place] level of care (*CSW will initial, date and re-position this form in  chart as items are completed):  Yes   Patient/family provided with [place] Work Department's list of facilities offering this level of care within the geographic area requested by the patient (or if unable, by the patient's family).  Yes   Patient/family informed of their freedom to choose among providers that offer the needed level of care, that participate in Medicare, Medicaid or managed care program needed by the patient, have an available bed and are willing to accept the patient.  Yes   Patient/family informed of [facility] [place]'s ownership interest in [facility] and [facility], as well as of the fact that they are under no obligation to receive care at these facilities.  PASRR submitted to EDS on 06/01/15     PASRR number received on 06/01/15     Existing PASRR number confirmed on       FL2 transmitted to all facilities in geographic area requested by pt/family on 06/01/15     FL2 transmitted to all facilities within larger geographic area on       Patient informed that his/her managed care company has contracts with or will negotiate with certain facilities, including the following:        Yes   Patient/family informed of bed offers received.  Patient chooses bed at [facility]     Physician recommends and patient chooses bed at      Patient to be transferred to [facility] on 06/04/15.  Patient to be transferred to facility by Car     Patient family notified on 06/04/15 of transfer.  Name of family member notified:  Rip Harbour, daughter     PHYSICIAN Please prepare prescriptions     Additional Comment:     _______________________________________________ Benard Halsted, [place] 06/04/2015, 11:43 AM

## 2015-06-04 NOTE — Progress Notes (Signed)
UR COMPLETED  

## 2015-06-05 DIAGNOSIS — J441 Chronic obstructive pulmonary disease with (acute) exacerbation: Secondary | ICD-10-CM | POA: Diagnosis not present

## 2015-06-05 DIAGNOSIS — A4189 Other specified sepsis: Secondary | ICD-10-CM | POA: Diagnosis not present

## 2015-06-05 DIAGNOSIS — J17 Pneumonia in diseases classified elsewhere: Secondary | ICD-10-CM | POA: Diagnosis not present

## 2015-06-08 ENCOUNTER — Telehealth: Payer: Self-pay | Admitting: Nurse Practitioner

## 2015-06-10 ENCOUNTER — Ambulatory Visit: Payer: Self-pay | Admitting: Internal Medicine

## 2015-06-11 ENCOUNTER — Other Ambulatory Visit: Payer: Self-pay | Admitting: Nurse Practitioner

## 2015-06-11 DIAGNOSIS — I10 Essential (primary) hypertension: Secondary | ICD-10-CM

## 2015-06-11 DIAGNOSIS — E119 Type 2 diabetes mellitus without complications: Secondary | ICD-10-CM

## 2015-06-11 DIAGNOSIS — Z794 Long term (current) use of insulin: Principal | ICD-10-CM

## 2015-06-11 DIAGNOSIS — J438 Other emphysema: Secondary | ICD-10-CM

## 2015-06-11 MED ORDER — IRON 325 (65 FE) MG PO TABS: ORAL_TABLET | ORAL | Status: DC

## 2015-06-11 MED ORDER — AMLODIPINE BESYLATE 10 MG PO TABS: 10.0000 mg | ORAL_TABLET | Freq: Every day | ORAL | Status: DC

## 2015-06-11 MED ORDER — INSULIN LISPRO PROT & LISPRO (75-25 MIX) 100 UNIT/ML KWIKPEN
PEN_INJECTOR | SUBCUTANEOUS | Status: DC
Start: 2015-06-11 — End: 2015-06-22

## 2015-06-11 MED ORDER — ISOSORBIDE MONONITRATE ER 30 MG PO TB24: 45.0000 mg | ORAL_TABLET | Freq: Every day | ORAL | Status: DC

## 2015-06-11 MED ORDER — ATORVASTATIN CALCIUM 20 MG PO TABS: 20.0000 mg | ORAL_TABLET | Freq: Every day | ORAL | Status: DC

## 2015-06-11 NOTE — Telephone Encounter (Signed)
MMM and I went over list and ordered changed meds only

## 2015-06-12 ENCOUNTER — Other Ambulatory Visit: Payer: Self-pay | Admitting: Nurse Practitioner

## 2015-06-12 ENCOUNTER — Telehealth: Payer: Self-pay | Admitting: Nurse Practitioner

## 2015-06-12 MED ORDER — HYDRALAZINE HCL 50 MG PO TABS: 50.0000 mg | ORAL_TABLET | Freq: Three times a day (TID) | ORAL | Status: DC

## 2015-06-12 MED ORDER — PREDNISONE 20 MG PO TABS: 40.0000 mg | ORAL_TABLET | Freq: Every day | ORAL | Status: DC

## 2015-06-14 ENCOUNTER — Other Ambulatory Visit: Payer: Self-pay | Admitting: Nurse Practitioner

## 2015-06-14 DIAGNOSIS — M1991 Primary osteoarthritis, unspecified site: Secondary | ICD-10-CM | POA: Diagnosis not present

## 2015-06-14 DIAGNOSIS — R21 Rash and other nonspecific skin eruption: Secondary | ICD-10-CM | POA: Diagnosis not present

## 2015-06-14 DIAGNOSIS — D510 Vitamin B12 deficiency anemia due to intrinsic factor deficiency: Secondary | ICD-10-CM | POA: Diagnosis not present

## 2015-06-14 DIAGNOSIS — R260 Ataxic gait: Secondary | ICD-10-CM | POA: Diagnosis not present

## 2015-06-14 DIAGNOSIS — E1140 Type 2 diabetes mellitus with diabetic neuropathy, unspecified: Secondary | ICD-10-CM | POA: Diagnosis not present

## 2015-06-14 DIAGNOSIS — M1000 Idiopathic gout, unspecified site: Secondary | ICD-10-CM | POA: Diagnosis not present

## 2015-06-14 DIAGNOSIS — I5032 Chronic diastolic (congestive) heart failure: Secondary | ICD-10-CM | POA: Diagnosis not present

## 2015-06-14 DIAGNOSIS — I130 Hypertensive heart and chronic kidney disease with heart failure and stage 1 through stage 4 chronic kidney disease, or unspecified chronic kidney disease: Secondary | ICD-10-CM | POA: Diagnosis not present

## 2015-06-14 DIAGNOSIS — E1122 Type 2 diabetes mellitus with diabetic chronic kidney disease: Secondary | ICD-10-CM | POA: Diagnosis not present

## 2015-06-14 DIAGNOSIS — J449 Chronic obstructive pulmonary disease, unspecified: Secondary | ICD-10-CM | POA: Diagnosis not present

## 2015-06-14 DIAGNOSIS — J438 Other emphysema: Secondary | ICD-10-CM

## 2015-06-14 DIAGNOSIS — N184 Chronic kidney disease, stage 4 (severe): Secondary | ICD-10-CM | POA: Diagnosis not present

## 2015-06-14 DIAGNOSIS — J441 Chronic obstructive pulmonary disease with (acute) exacerbation: Secondary | ICD-10-CM | POA: Diagnosis not present

## 2015-06-14 DIAGNOSIS — G894 Chronic pain syndrome: Secondary | ICD-10-CM | POA: Diagnosis not present

## 2015-06-14 NOTE — Telephone Encounter (Signed)
Done 06/12/15

## 2015-06-15 DIAGNOSIS — I130 Hypertensive heart and chronic kidney disease with heart failure and stage 1 through stage 4 chronic kidney disease, or unspecified chronic kidney disease: Secondary | ICD-10-CM | POA: Diagnosis not present

## 2015-06-15 DIAGNOSIS — I5032 Chronic diastolic (congestive) heart failure: Secondary | ICD-10-CM | POA: Diagnosis not present

## 2015-06-15 DIAGNOSIS — M1991 Primary osteoarthritis, unspecified site: Secondary | ICD-10-CM | POA: Diagnosis not present

## 2015-06-15 DIAGNOSIS — R260 Ataxic gait: Secondary | ICD-10-CM | POA: Diagnosis not present

## 2015-06-15 DIAGNOSIS — G894 Chronic pain syndrome: Secondary | ICD-10-CM | POA: Diagnosis not present

## 2015-06-15 DIAGNOSIS — J441 Chronic obstructive pulmonary disease with (acute) exacerbation: Secondary | ICD-10-CM | POA: Diagnosis not present

## 2015-06-15 DIAGNOSIS — R21 Rash and other nonspecific skin eruption: Secondary | ICD-10-CM | POA: Diagnosis not present

## 2015-06-15 DIAGNOSIS — E1122 Type 2 diabetes mellitus with diabetic chronic kidney disease: Secondary | ICD-10-CM | POA: Diagnosis not present

## 2015-06-15 DIAGNOSIS — M1000 Idiopathic gout, unspecified site: Secondary | ICD-10-CM | POA: Diagnosis not present

## 2015-06-15 DIAGNOSIS — E1140 Type 2 diabetes mellitus with diabetic neuropathy, unspecified: Secondary | ICD-10-CM | POA: Diagnosis not present

## 2015-06-15 DIAGNOSIS — N184 Chronic kidney disease, stage 4 (severe): Secondary | ICD-10-CM | POA: Diagnosis not present

## 2015-06-15 DIAGNOSIS — D510 Vitamin B12 deficiency anemia due to intrinsic factor deficiency: Secondary | ICD-10-CM | POA: Diagnosis not present

## 2015-06-16 DIAGNOSIS — J449 Chronic obstructive pulmonary disease, unspecified: Secondary | ICD-10-CM | POA: Diagnosis not present

## 2015-06-16 DIAGNOSIS — I509 Heart failure, unspecified: Secondary | ICD-10-CM | POA: Diagnosis not present

## 2015-06-16 DIAGNOSIS — A419 Sepsis, unspecified organism: Secondary | ICD-10-CM | POA: Diagnosis not present

## 2015-06-17 ENCOUNTER — Emergency Department (HOSPITAL_COMMUNITY): Payer: Medicare Other

## 2015-06-17 ENCOUNTER — Encounter (HOSPITAL_COMMUNITY): Payer: Self-pay

## 2015-06-17 ENCOUNTER — Inpatient Hospital Stay (HOSPITAL_COMMUNITY)
Admission: EM | Admit: 2015-06-17 | Discharge: 2015-06-22 | DRG: 291 | Disposition: A | Discharge status: Hospice | Payer: Medicare Other | Attending: Family Medicine | Admitting: Family Medicine

## 2015-06-17 DIAGNOSIS — Z79899 Other long term (current) drug therapy: Secondary | ICD-10-CM

## 2015-06-17 DIAGNOSIS — I5033 Acute on chronic diastolic (congestive) heart failure: Secondary | ICD-10-CM | POA: Diagnosis not present

## 2015-06-17 DIAGNOSIS — Z7982 Long term (current) use of aspirin: Secondary | ICD-10-CM

## 2015-06-17 DIAGNOSIS — J9621 Acute and chronic respiratory failure with hypoxia: Secondary | ICD-10-CM | POA: Diagnosis not present

## 2015-06-17 DIAGNOSIS — M109 Gout, unspecified: Secondary | ICD-10-CM | POA: Diagnosis present

## 2015-06-17 DIAGNOSIS — K219 Gastro-esophageal reflux disease without esophagitis: Secondary | ICD-10-CM | POA: Diagnosis not present

## 2015-06-17 DIAGNOSIS — I5032 Chronic diastolic (congestive) heart failure: Secondary | ICD-10-CM | POA: Diagnosis not present

## 2015-06-17 DIAGNOSIS — I080 Rheumatic disorders of both mitral and aortic valves: Secondary | ICD-10-CM | POA: Diagnosis present

## 2015-06-17 DIAGNOSIS — K649 Unspecified hemorrhoids: Secondary | ICD-10-CM | POA: Diagnosis present

## 2015-06-17 DIAGNOSIS — M1000 Idiopathic gout, unspecified site: Secondary | ICD-10-CM | POA: Diagnosis not present

## 2015-06-17 DIAGNOSIS — I5043 Acute on chronic combined systolic (congestive) and diastolic (congestive) heart failure: Secondary | ICD-10-CM | POA: Diagnosis not present

## 2015-06-17 DIAGNOSIS — Z515 Encounter for palliative care: Secondary | ICD-10-CM | POA: Diagnosis not present

## 2015-06-17 DIAGNOSIS — E785 Hyperlipidemia, unspecified: Secondary | ICD-10-CM | POA: Diagnosis not present

## 2015-06-17 DIAGNOSIS — J449 Chronic obstructive pulmonary disease, unspecified: Secondary | ICD-10-CM | POA: Diagnosis present

## 2015-06-17 DIAGNOSIS — I120 Hypertensive chronic kidney disease with stage 5 chronic kidney disease or end stage renal disease: Secondary | ICD-10-CM | POA: Diagnosis not present

## 2015-06-17 DIAGNOSIS — M1991 Primary osteoarthritis, unspecified site: Secondary | ICD-10-CM | POA: Diagnosis not present

## 2015-06-17 DIAGNOSIS — I248 Other forms of acute ischemic heart disease: Secondary | ICD-10-CM | POA: Diagnosis present

## 2015-06-17 DIAGNOSIS — Z9842 Cataract extraction status, left eye: Secondary | ICD-10-CM

## 2015-06-17 DIAGNOSIS — J440 Chronic obstructive pulmonary disease with acute lower respiratory infection: Secondary | ICD-10-CM | POA: Diagnosis not present

## 2015-06-17 DIAGNOSIS — M351 Other overlap syndromes: Secondary | ICD-10-CM | POA: Diagnosis present

## 2015-06-17 DIAGNOSIS — F418 Other specified anxiety disorders: Secondary | ICD-10-CM | POA: Diagnosis present

## 2015-06-17 DIAGNOSIS — D649 Anemia, unspecified: Secondary | ICD-10-CM | POA: Diagnosis present

## 2015-06-17 DIAGNOSIS — I130 Hypertensive heart and chronic kidney disease with heart failure and stage 1 through stage 4 chronic kidney disease, or unspecified chronic kidney disease: Principal | ICD-10-CM | POA: Diagnosis present

## 2015-06-17 DIAGNOSIS — R627 Adult failure to thrive: Secondary | ICD-10-CM | POA: Diagnosis present

## 2015-06-17 DIAGNOSIS — R7989 Other specified abnormal findings of blood chemistry: Secondary | ICD-10-CM | POA: Diagnosis not present

## 2015-06-17 DIAGNOSIS — Z9111 Patient's noncompliance with dietary regimen: Secondary | ICD-10-CM | POA: Diagnosis not present

## 2015-06-17 DIAGNOSIS — N049 Nephrotic syndrome with unspecified morphologic changes: Secondary | ICD-10-CM | POA: Diagnosis not present

## 2015-06-17 DIAGNOSIS — E1140 Type 2 diabetes mellitus with diabetic neuropathy, unspecified: Secondary | ICD-10-CM | POA: Diagnosis not present

## 2015-06-17 DIAGNOSIS — G4733 Obstructive sleep apnea (adult) (pediatric): Secondary | ICD-10-CM | POA: Diagnosis not present

## 2015-06-17 DIAGNOSIS — Z9841 Cataract extraction status, right eye: Secondary | ICD-10-CM

## 2015-06-17 DIAGNOSIS — N179 Acute kidney failure, unspecified: Secondary | ICD-10-CM | POA: Diagnosis not present

## 2015-06-17 DIAGNOSIS — N184 Chronic kidney disease, stage 4 (severe): Secondary | ICD-10-CM

## 2015-06-17 DIAGNOSIS — M1990 Unspecified osteoarthritis, unspecified site: Secondary | ICD-10-CM | POA: Diagnosis not present

## 2015-06-17 DIAGNOSIS — D509 Iron deficiency anemia, unspecified: Secondary | ICD-10-CM | POA: Diagnosis not present

## 2015-06-17 DIAGNOSIS — G894 Chronic pain syndrome: Secondary | ICD-10-CM | POA: Diagnosis not present

## 2015-06-17 DIAGNOSIS — R778 Other specified abnormalities of plasma proteins: Secondary | ICD-10-CM | POA: Diagnosis present

## 2015-06-17 DIAGNOSIS — R21 Rash and other nonspecific skin eruption: Secondary | ICD-10-CM | POA: Diagnosis not present

## 2015-06-17 DIAGNOSIS — N028 Recurrent and persistent hematuria with other morphologic changes: Secondary | ICD-10-CM | POA: Diagnosis not present

## 2015-06-17 DIAGNOSIS — Z6839 Body mass index (BMI) 39.0-39.9, adult: Secondary | ICD-10-CM | POA: Diagnosis not present

## 2015-06-17 DIAGNOSIS — E8770 Fluid overload, unspecified: Secondary | ICD-10-CM | POA: Diagnosis not present

## 2015-06-17 DIAGNOSIS — E8809 Other disorders of plasma-protein metabolism, not elsewhere classified: Secondary | ICD-10-CM | POA: Diagnosis not present

## 2015-06-17 DIAGNOSIS — Z794 Long term (current) use of insulin: Secondary | ICD-10-CM

## 2015-06-17 DIAGNOSIS — I110 Hypertensive heart disease with heart failure: Secondary | ICD-10-CM | POA: Diagnosis not present

## 2015-06-17 DIAGNOSIS — D631 Anemia in chronic kidney disease: Secondary | ICD-10-CM | POA: Diagnosis not present

## 2015-06-17 DIAGNOSIS — J9600 Acute respiratory failure, unspecified whether with hypoxia or hypercapnia: Secondary | ICD-10-CM | POA: Diagnosis present

## 2015-06-17 DIAGNOSIS — D510 Vitamin B12 deficiency anemia due to intrinsic factor deficiency: Secondary | ICD-10-CM | POA: Diagnosis not present

## 2015-06-17 DIAGNOSIS — F319 Bipolar disorder, unspecified: Secondary | ICD-10-CM | POA: Diagnosis present

## 2015-06-17 DIAGNOSIS — E1122 Type 2 diabetes mellitus with diabetic chronic kidney disease: Secondary | ICD-10-CM | POA: Diagnosis not present

## 2015-06-17 DIAGNOSIS — J441 Chronic obstructive pulmonary disease with (acute) exacerbation: Secondary | ICD-10-CM | POA: Diagnosis not present

## 2015-06-17 DIAGNOSIS — E669 Obesity, unspecified: Secondary | ICD-10-CM | POA: Diagnosis present

## 2015-06-17 DIAGNOSIS — R0602 Shortness of breath: Secondary | ICD-10-CM | POA: Diagnosis not present

## 2015-06-17 DIAGNOSIS — R0600 Dyspnea, unspecified: Secondary | ICD-10-CM | POA: Diagnosis not present

## 2015-06-17 DIAGNOSIS — J9601 Acute respiratory failure with hypoxia: Secondary | ICD-10-CM

## 2015-06-17 DIAGNOSIS — N189 Chronic kidney disease, unspecified: Secondary | ICD-10-CM | POA: Diagnosis not present

## 2015-06-17 DIAGNOSIS — Z87891 Personal history of nicotine dependence: Secondary | ICD-10-CM | POA: Diagnosis not present

## 2015-06-17 DIAGNOSIS — Z049 Encounter for examination and observation for unspecified reason: Secondary | ICD-10-CM | POA: Diagnosis not present

## 2015-06-17 DIAGNOSIS — R260 Ataxic gait: Secondary | ICD-10-CM | POA: Diagnosis not present

## 2015-06-17 HISTORY — DX: Disorder of kidney and ureter, unspecified: N28.9

## 2015-06-17 HISTORY — DX: Chronic diastolic (congestive) heart failure: I50.32

## 2015-06-17 HISTORY — DX: Biliary acute pancreatitis without necrosis or infection: K85.10

## 2015-06-17 HISTORY — DX: Type 2 diabetes mellitus without complications: E11.9

## 2015-06-17 HISTORY — DX: Rash and other nonspecific skin eruption: R21

## 2015-06-17 HISTORY — DX: Reserved for inherently not codable concepts without codable children: IMO0001

## 2015-06-17 HISTORY — DX: Polyneuropathy, unspecified: G62.9

## 2015-06-17 HISTORY — DX: Nonrheumatic aortic (valve) stenosis: I35.0

## 2015-06-17 HISTORY — DX: Nonrheumatic mitral (valve) insufficiency: I34.0

## 2015-06-17 LAB — CBC WITH DIFFERENTIAL/PLATELET
Basophils Absolute: 0 10*3/uL (ref 0.0–0.1)
Basophils Relative: 0 %
Eosinophils Absolute: 0 10*3/uL (ref 0.0–0.7)
Eosinophils Relative: 0 %
HEMATOCRIT: 28.6 % — AB (ref 36.0–46.0)
Hemoglobin: 8.7 g/dL — ABNORMAL LOW (ref 12.0–15.0)
LYMPHS PCT: 3 %
Lymphs Abs: 0.2 10*3/uL — ABNORMAL LOW (ref 0.7–4.0)
MCH: 27.7 pg (ref 26.0–34.0)
MCHC: 30.4 g/dL (ref 30.0–36.0)
MCV: 91.1 fL (ref 78.0–100.0)
MONO ABS: 0.2 10*3/uL (ref 0.1–1.0)
MONOS PCT: 2 %
NEUTROS ABS: 7.3 10*3/uL (ref 1.7–7.7)
Neutrophils Relative %: 95 %
Platelets: 127 10*3/uL — ABNORMAL LOW (ref 150–400)
RBC: 3.14 MIL/uL — ABNORMAL LOW (ref 3.87–5.11)
RDW: 17.4 % — AB (ref 11.5–15.5)
WBC: 7.7 10*3/uL (ref 4.0–10.5)

## 2015-06-17 LAB — COMPREHENSIVE METABOLIC PANEL
ALT: 17 U/L (ref 14–54)
ANION GAP: 11 (ref 5–15)
AST: 23 U/L (ref 15–41)
Albumin: 2.3 g/dL — ABNORMAL LOW (ref 3.5–5.0)
Alkaline Phosphatase: 41 U/L (ref 38–126)
BILIRUBIN TOTAL: 0.5 mg/dL (ref 0.3–1.2)
BUN: 122 mg/dL — AB (ref 6–20)
CHLORIDE: 109 mmol/L (ref 101–111)
CO2: 23 mmol/L (ref 22–32)
Calcium: 8.5 mg/dL — ABNORMAL LOW (ref 8.9–10.3)
Creatinine, Ser: 2.84 mg/dL — ABNORMAL HIGH (ref 0.44–1.00)
GFR calc Af Amer: 17 mL/min — ABNORMAL LOW (ref >60)
GFR, EST NON AFRICAN AMERICAN: 15 mL/min — AB (ref >60)
Glucose, Bld: 114 mg/dL — ABNORMAL HIGH (ref 65–99)
POTASSIUM: 4.8 mmol/L (ref 3.5–5.1)
Sodium: 143 mmol/L (ref 135–145)
Total Protein: 5.1 g/dL — ABNORMAL LOW (ref 6.5–8.1)

## 2015-06-17 LAB — I-STAT CG4 LACTIC ACID, ED: Lactic Acid, Venous: 0.45 mmol/L — ABNORMAL LOW (ref 0.5–2.0)

## 2015-06-17 LAB — LIPASE, BLOOD: Lipase: 50 U/L (ref 11–51)

## 2015-06-17 LAB — TROPONIN I: Troponin I: 0.06 ng/mL — ABNORMAL HIGH (ref <0.031)

## 2015-06-17 LAB — GLUCOSE, CAPILLARY: GLUCOSE-CAPILLARY: 97 mg/dL (ref 65–99)

## 2015-06-17 LAB — BRAIN NATRIURETIC PEPTIDE: B NATRIURETIC PEPTIDE 5: 660.3 pg/mL — AB (ref 0.0–100.0)

## 2015-06-17 MED ORDER — ASPIRIN EC 81 MG PO TBEC
81.0000 mg | DELAYED_RELEASE_TABLET | Freq: Every day | ORAL | Status: DC
  Administered 2015-06-18 – 2015-06-21 (×4): 81 mg via ORAL
  Filled 2015-06-17 (×4): qty 1

## 2015-06-17 MED ORDER — PANTOPRAZOLE SODIUM 40 MG PO TBEC
40.0000 mg | DELAYED_RELEASE_TABLET | Freq: Every day | ORAL | Status: DC
  Administered 2015-06-18 – 2015-06-21 (×4): 40 mg via ORAL
  Filled 2015-06-17 (×4): qty 1

## 2015-06-17 MED ORDER — SODIUM CHLORIDE 0.9% FLUSH: 3.0000 mL | INTRAVENOUS | Status: DC | PRN

## 2015-06-17 MED ORDER — FISH OIL 1000 MG PO CAPS: 1.0000 | ORAL_CAPSULE | Freq: Every day | ORAL | Status: DC

## 2015-06-17 MED ORDER — HEPARIN SODIUM (PORCINE) 5000 UNIT/ML IJ SOLN
5000.0000 [IU] | Freq: Three times a day (TID) | INTRAMUSCULAR | Status: DC
  Administered 2015-06-17 – 2015-06-22 (×14): 5000 [IU] via SUBCUTANEOUS
  Filled 2015-06-17 (×12): qty 1

## 2015-06-17 MED ORDER — ISOSORBIDE MONONITRATE ER 30 MG PO TB24
45.0000 mg | ORAL_TABLET | Freq: Every day | ORAL | Status: DC
  Administered 2015-06-18: 45 mg via ORAL
  Filled 2015-06-17: qty 2

## 2015-06-17 MED ORDER — SODIUM CHLORIDE 0.9 % IV SOLN: 250.0000 mL | INTRAVENOUS | Status: DC | PRN

## 2015-06-17 MED ORDER — OXYCODONE HCL 5 MG PO TABS
5.0000 mg | ORAL_TABLET | ORAL | Status: DC | PRN
  Administered 2015-06-21: 5 mg via ORAL
  Filled 2015-06-17: qty 1

## 2015-06-17 MED ORDER — CLONIDINE HCL 0.2 MG PO TABS
0.2000 mg | ORAL_TABLET | Freq: Three times a day (TID) | ORAL | Status: DC
  Administered 2015-06-17 – 2015-06-19 (×7): 0.2 mg via ORAL
  Filled 2015-06-17 (×8): qty 1

## 2015-06-17 MED ORDER — POLYETHYLENE GLYCOL 3350 17 G PO PACK
17.0000 g | PACK | Freq: Every day | ORAL | Status: DC
  Administered 2015-06-20 – 2015-06-21 (×2): 17 g via ORAL
  Filled 2015-06-17 (×4): qty 1

## 2015-06-17 MED ORDER — FERROUS SULFATE 325 (65 FE) MG PO TABS
325.0000 mg | ORAL_TABLET | Freq: Every day | ORAL | Status: DC
  Administered 2015-06-18 – 2015-06-21 (×4): 325 mg via ORAL
  Filled 2015-06-17 (×4): qty 1

## 2015-06-17 MED ORDER — PREDNISONE 20 MG PO TABS: 40.0000 mg | ORAL_TABLET | Freq: Every day | ORAL | Status: DC

## 2015-06-17 MED ORDER — OXYCODONE-ACETAMINOPHEN 7.5-325 MG PO TABS
1.0000 | ORAL_TABLET | Freq: Three times a day (TID) | ORAL | Status: DC | PRN
  Administered 2015-06-18 – 2015-06-22 (×3): 1 via ORAL
  Filled 2015-06-17 (×3): qty 1

## 2015-06-17 MED ORDER — ARFORMOTEROL TARTRATE 15 MCG/2ML IN NEBU
15.0000 ug | INHALATION_SOLUTION | Freq: Two times a day (BID) | RESPIRATORY_TRACT | Status: DC
  Administered 2015-06-17 – 2015-06-22 (×9): 15 ug via RESPIRATORY_TRACT
  Filled 2015-06-17 (×9): qty 2

## 2015-06-17 MED ORDER — OXYCODONE-ACETAMINOPHEN 5-325 MG PO TABS: 1.0000 | ORAL_TABLET | ORAL | Status: DC | PRN

## 2015-06-17 MED ORDER — FUROSEMIDE 10 MG/ML IJ SOLN
40.0000 mg | Freq: Two times a day (BID) | INTRAMUSCULAR | Status: DC
  Administered 2015-06-17 – 2015-06-18 (×2): 40 mg via INTRAVENOUS
  Filled 2015-06-17 (×2): qty 4

## 2015-06-17 MED ORDER — CITALOPRAM HYDROBROMIDE 20 MG PO TABS
20.0000 mg | ORAL_TABLET | Freq: Every day | ORAL | Status: DC
  Administered 2015-06-18 – 2015-06-22 (×5): 20 mg via ORAL
  Filled 2015-06-17 (×5): qty 1

## 2015-06-17 MED ORDER — OMEGA-3-ACID ETHYL ESTERS 1 G PO CAPS
1.0000 g | ORAL_CAPSULE | Freq: Every day | ORAL | Status: DC
  Administered 2015-06-18 – 2015-06-21 (×4): 1 g via ORAL
  Filled 2015-06-17 (×4): qty 1

## 2015-06-17 MED ORDER — ACETAMINOPHEN 325 MG PO TABS
975.0000 mg | ORAL_TABLET | Freq: Every day | ORAL | Status: DC
  Administered 2015-06-17 – 2015-06-18 (×2): 975 mg via ORAL
  Administered 2015-06-19: 650 mg via ORAL
  Administered 2015-06-20 – 2015-06-21 (×2): 975 mg via ORAL
  Filled 2015-06-17 (×4): qty 3

## 2015-06-17 MED ORDER — FEBUXOSTAT 40 MG PO TABS
40.0000 mg | ORAL_TABLET | Freq: Every day | ORAL | Status: DC
  Administered 2015-06-18 – 2015-06-22 (×5): 40 mg via ORAL
  Filled 2015-06-17 (×5): qty 1

## 2015-06-17 MED ORDER — ALBUTEROL SULFATE (2.5 MG/3ML) 0.083% IN NEBU
3.0000 mL | INHALATION_SOLUTION | RESPIRATORY_TRACT | Status: DC | PRN
  Administered 2015-06-18 (×2): 3 mL via RESPIRATORY_TRACT
  Filled 2015-06-17 (×2): qty 3

## 2015-06-17 MED ORDER — GABAPENTIN 100 MG PO CAPS
100.0000 mg | ORAL_CAPSULE | Freq: Two times a day (BID) | ORAL | Status: DC
  Administered 2015-06-17 – 2015-06-22 (×10): 100 mg via ORAL
  Filled 2015-06-17 (×10): qty 1

## 2015-06-17 MED ORDER — BUDESONIDE-FORMOTEROL FUMARATE 80-4.5 MCG/ACT IN AERO
1.0000 | INHALATION_SPRAY | Freq: Two times a day (BID) | RESPIRATORY_TRACT | Status: DC
  Administered 2015-06-17 – 2015-06-22 (×10): 1 via RESPIRATORY_TRACT
  Filled 2015-06-17 (×3): qty 6.9

## 2015-06-17 MED ORDER — AMLODIPINE BESYLATE 10 MG PO TABS
10.0000 mg | ORAL_TABLET | Freq: Every day | ORAL | Status: DC
  Administered 2015-06-18 – 2015-06-19 (×2): 10 mg via ORAL
  Filled 2015-06-17 (×3): qty 1

## 2015-06-17 MED ORDER — ASPIRIN EC 81 MG PO TBEC
81.0000 mg | DELAYED_RELEASE_TABLET | Freq: Every day | ORAL | Status: DC
Start: 2015-06-17 — End: 2015-06-17

## 2015-06-17 MED ORDER — OXYCODONE-ACETAMINOPHEN 10-325 MG PO TABS: 1.0000 | ORAL_TABLET | Freq: Two times a day (BID) | ORAL | Status: DC

## 2015-06-17 MED ORDER — FUROSEMIDE 10 MG/ML IJ SOLN
40.0000 mg | Freq: Once | INTRAMUSCULAR | Status: AC
  Administered 2015-06-17: 40 mg via INTRAVENOUS
  Filled 2015-06-17: qty 4

## 2015-06-17 MED ORDER — ONDANSETRON HCL 4 MG/2ML IJ SOLN: 4.0000 mg | Freq: Four times a day (QID) | INTRAMUSCULAR | Status: DC | PRN

## 2015-06-17 MED ORDER — SODIUM CHLORIDE 0.9% FLUSH
3.0000 mL | Freq: Two times a day (BID) | INTRAVENOUS | Status: DC
  Administered 2015-06-17 – 2015-06-22 (×9): 3 mL via INTRAVENOUS

## 2015-06-17 MED ORDER — ATORVASTATIN CALCIUM 20 MG PO TABS
20.0000 mg | ORAL_TABLET | Freq: Every day | ORAL | Status: DC
  Administered 2015-06-17 – 2015-06-22 (×6): 20 mg via ORAL
  Filled 2015-06-17 (×6): qty 1

## 2015-06-17 MED ORDER — METHYLPREDNISOLONE SODIUM SUCC 40 MG IJ SOLR
40.0000 mg | Freq: Two times a day (BID) | INTRAMUSCULAR | Status: DC
  Administered 2015-06-17 – 2015-06-18 (×2): 40 mg via INTRAVENOUS
  Filled 2015-06-17 (×3): qty 1

## 2015-06-17 MED ORDER — CARVEDILOL 25 MG PO TABS
25.0000 mg | ORAL_TABLET | Freq: Two times a day (BID) | ORAL | Status: DC
  Administered 2015-06-18 – 2015-06-19 (×3): 25 mg via ORAL
  Filled 2015-06-17 (×3): qty 1

## 2015-06-17 MED ORDER — HYDRALAZINE HCL 50 MG PO TABS
50.0000 mg | ORAL_TABLET | Freq: Three times a day (TID) | ORAL | Status: DC
  Administered 2015-06-17 – 2015-06-18 (×2): 50 mg via ORAL
  Filled 2015-06-17 (×2): qty 1

## 2015-06-17 MED ORDER — ACETAMINOPHEN 325 MG PO TABS
650.0000 mg | ORAL_TABLET | ORAL | Status: DC | PRN
  Administered 2015-06-19 – 2015-06-20 (×2): 650 mg via ORAL
  Filled 2015-06-17 (×3): qty 2

## 2015-06-17 NOTE — Consult Note (Signed)
Referring Provider: No ref. provider found Primary Care Physician:  Chevis Pretty, FNP Primary Nephrologist:  Dr. Lorrene Reid Reason for Consultation:   IgA nephropathy admitted with acute pulmonary edema  HPI: This is a very pleasant 81 year old lady that underwent a renal biopsy in February and scheduled to discuss results with Dr Lorrene Reid. The biopsy Kidney bx 06/2015: "PROLIFERATIVE AND EARLY SCLEROSING IGA GLOMERULONEPHRITIS WITH 3% SEGMENTAL FIBROCELLULAR CRESCENT FORMATION (1 OF 36 GLOMERULI). MILD ARTERIOSCLEROSIS WITH FOCAL MILD TUBULOINTERSTITIAL SCARRING AND SUPERIMPOSED MILD ACUTE TUBULAR INJURY."   Past Medical History  Diagnosis Date  . Hypertension   . COPD (chronic obstructive pulmonary disease) ([place])   . Neuromuscular disorder ([place])   . Depression with anxiety   . Osteopenia   . Arthritis   . GERD (gastroesophageal reflux disease)   . Hyperlipidemia   . Obesity   . Vitamin B12 deficiency   . Chronic kidney disease (CKD), stage IV (severe) ([place])   . Sleep apnea     no cpap use  . Anemia     iron infusion- x2 recent , last 5'16  . Hx of adenomatous colonic polyps 09/2014  . Diabetes mellitus ([place])     type ll   dx about 30 yrs ago.  [name] HNP (herniated nucleus pulposus with myelopathy), thoracic   . Chronic diastolic CHF (congestive heart failure) ([place])   . Pneumonia 05/2015  . Peripheral neuropathy ([place])   . Aortic stenosis     a. Mild by echo 04/2015.  . Mitral regurgitation     a. Mild by echo 04/2015.  . Biliary acute pancreatitis     a. s/p ERCP 04/2015; subsequent cholecystectomy 05/2015.  . Macular rash     a. 06/2015: Per IM note, "acular rash in upper extremity legs and back/skin vasculitis/ MCTD (mixed connective tissue disease)."  . Acute renal insufficiency     a. Kidney bx 06/2015: "PROLIFERATIVE AND EARLY SCLEROSING IGA GLOMERULONEPHRITIS WITH 3% SEGMENTAL FIBROCELLULAR CRESCENT FORMATION (1 OF 36 GLOMERULI). MILD ARTERIOSCLEROSIS WITH FOCAL MILD  TUBULOINTERSTITIAL SCARRING AND SUPERIMPOSED MILD ACUTE TUBULAR INJURY."  . Normal cardiac stress test 04/2015    Past Surgical History  Procedure Laterality Date  . Breast surgery  April 1996    needle biopsy   . Tubal ligation    . Cataract extraction, bilateral Bilateral   . Esophagogastroduodenoscopy N/A 10/19/2014    Procedure: ESOPHAGOGASTRODUODENOSCOPY (EGD);  Surgeon: Gatha Mayer, MD;  Location: [facility];  Service: Endoscopy;  Laterality: N/A;  . Colonoscopy N/A 10/19/2014    Procedure: COLONOSCOPY;  Surgeon: Gatha Mayer, MD;  Location: [facility];  Service: Endoscopy;  Laterality: N/A;  . Ercp N/A 04/22/2015    Procedure: ENDOSCOPIC RETROGRADE CHOLANGIOPANCREATOGRAPHY (ERCP);  Surgeon: Milus Banister, MD;  Location: [facility];  Service: Endoscopy;  Laterality: N/A;  . Laparoscopic cholecystectomy  05/25/2015  . Cholecystectomy N/A 05/25/2015    Procedure: LAPAROSCOPIC CHOLECYSTECTOMY;  Surgeon: Coralie Keens, MD;  Location: [facility];  Service: General;  Laterality: N/A;    Prior to Admission medications   Medication Sig Start Date End Date Taking? Authorizing Provider  acetaminophen (TYLENOL) 650 MG CR tablet Take 1,300 mg by mouth at bedtime.   Yes Historical Provider, MD  albuterol (PROVENTIL HFA;VENTOLIN HFA) 108 (90 Base) MCG/ACT inhaler Inhale 1 puff into the lungs every 4 (four) hours as needed for wheezing or shortness of breath.   Yes Historical Provider, MD  amLODipine (NORVASC) 10 MG tablet Take 1 tablet (10 mg total) by mouth daily.  06/11/15  Yes Mary-Margaret Hassell Done, FNP  arformoterol (BROVANA) 15 MCG/2ML NEBU Take 15 mcg by nebulization 2 (two) times daily.   Yes Historical Provider, MD  aspirin 81 MG tablet Take 81 mg by mouth daily.     Yes Historical Provider, MD  atorvastatin (LIPITOR) 20 MG tablet Take 1 tablet (20 mg total) by mouth daily. 06/11/15  Yes Mary-Margaret Hassell Done, FNP  budesonide-formoterol (SYMBICORT) 80-4.5 MCG/ACT inhaler Inhale 1 puff  into the lungs 2 (two) times daily. 11/26/14  Yes Mary-Margaret Hassell Done, FNP  carvedilol (COREG) 25 MG tablet TAKE 1 TABLET BY MOUTH TWICE DAILY WITH A MEAL 12/10/14  Yes Mary-Margaret Hassell Done, FNP  citalopram (CELEXA) 20 MG tablet TAKE 1 TABLET BY MOUTH EVERY DAY 12/10/14  Yes Mary-Margaret Hassell Done, FNP  cloNIDine (CATAPRES) 0.2 MG tablet Take 1 tablet (0.2 mg total) by mouth 3 (three) times daily. 04/25/15  Yes Thurnell Lose, MD  febuxostat (ULORIC) 40 MG tablet Take 1 tablet (40 mg total) by mouth daily. 11/26/14  Yes Mary-Margaret Hassell Done, FNP  fenofibrate (TRICOR) 145 MG tablet Take 1 tablet (145 mg total) by mouth daily. 11/26/14  Yes Mary-Margaret Hassell Done, FNP  Ferrous Sulfate (IRON) 325 (65 Fe) MG TABS Take 1 to 2 tabs po qd as directed Patient taking differently: Take 1 tablet by mouth daily. Take 1 to 2 tabs po qd as directed 06/11/15  Yes Mary-Margaret Hassell Done, FNP  furosemide (LASIX) 40 MG tablet Take 1 tablet (40 mg total) by mouth daily. 04/25/15  Yes Thurnell Lose, MD  gabapentin (NEURONTIN) 300 MG capsule TAKE 1 CAPSULE BY MOUTH FOUR TIMES A DAY 03/26/15  Yes Mary-Margaret Hassell Done, FNP  hydrALAZINE (APRESOLINE) 50 MG tablet Take 1 tablet (50 mg total) by mouth every 8 (eight) hours. 06/12/15  Yes Mary-Margaret Hassell Done, FNP  Insulin Lispro Prot & Lispro (HUMALOG MIX 75/25 KWIKPEN) (75-25) 100 UNIT/ML Kwikpen Inject 25u bid as directed Patient taking differently: Inject 25 Units into the skin. Inject 25u bid as directed 06/11/15  Yes Mary-Margaret Hassell Done, FNP  isosorbide mononitrate (IMDUR) 30 MG 24 hr tablet Take 1.5 tablets (45 mg total) by mouth daily. 06/11/15  Yes Mary-Margaret Hassell Done, FNP  Omega-3 Fatty Acids (FISH OIL) 1000 MG CAPS Take 1 capsule by mouth daily.   Yes Historical Provider, MD  omeprazole (PRILOSEC) 40 MG capsule TAKE 1 CAPSULE BY MOUTH EVERY DAY 11/26/14  Yes Mary-Margaret Hassell Done, FNP  oxyCODONE-acetaminophen (PERCOCET) 7.5-325 MG tablet Take 1 tablet by mouth 3 (three) times  daily as needed for moderate pain or severe pain.   Yes Historical Provider, MD  polyethylene glycol (MIRALAX / GLYCOLAX) packet Take 17 g by mouth daily. And an additional packet if needed   Yes Historical Provider, MD  Alcohol Swabs (PHARMACIST CHOICE ALCOHOL) PADS USE 2 TIMES DAILY FOR DIABETIC TESTING 12/09/14   Chipper Herb, MD  EASY [facility] INSULIN SYRINGE 31G X 5/16" 1 ML MISC USE TO INJECT INSULIN TWICE DAILY 12/10/12   Mary-Margaret Hassell Done, FNP  EASY TOUCH PEN NEEDLES 31G X 8 MM MISC USE TWICE DAILY 04/30/15   Mary-Margaret Hassell Done, FNP  glucose blood (ACCU-CHEK AVIVA PLUS) test strip Test 4-5 x a day and prn dx E11.9 01/07/15   Mary-Margaret Hassell Done, FNP  glucose blood (ONETOUCH VERIO) test strip Test 4x a day and prn  E11.9 04/19/15   Mary-Margaret Hassell Done, FNP  glucose blood test strip Test 4x a day and prn  Dx E11.9 01/07/15   Mary-Margaret Hassell Done, FNP  oxyCODONE-acetaminophen (PERCOCET) 10-325 MG tablet Take 1  tablet by mouth 2 (two) times daily. Patient not taking: Reported on 06/17/2015 04/30/15   Mary-Margaret Hassell Done, FNP  predniSONE (DELTASONE) 20 MG tablet Take 2 tablets (40 mg total) by mouth daily with breakfast. Patient not taking: Reported on 06/17/2015 06/12/15   Mary-Margaret Hassell Done, FNP    Current Facility-Administered Medications  Medication Dose Route Frequency Provider Last Rate Last Dose  . 0.9 %  sodium chloride infusion  250 mL Intravenous PRN Nita Sells, MD      . acetaminophen (TYLENOL) tablet 650 mg  650 mg Oral Q4H PRN Nita Sells, MD      . acetaminophen (TYLENOL) tablet 975 mg  975 mg Oral QHS Jai-Gurmukh Samtani, MD      . albuterol (PROVENTIL) (2.5 MG/3ML) 0.083% nebulizer solution 3 mL  3 mL Inhalation Q4H PRN Nita Sells, MD      . Derrill Memo ON 06/18/2015] amLODipine (NORVASC) tablet 10 mg  10 mg Oral Daily Nita Sells, MD      . arformoterol (BROVANA) nebulizer solution 15 mcg  15 mcg Nebulization BID Nita Sells, MD   15 mcg at  06/17/15 2029  . [START ON 06/18/2015] aspirin EC tablet 81 mg  81 mg Oral Daily Nita Sells, MD      . atorvastatin (LIPITOR) tablet 20 mg  20 mg Oral q1800 Nita Sells, MD      . budesonide-formoterol (SYMBICORT) 80-4.5 MCG/ACT inhaler 1 puff  1 puff Inhalation BID Nita Sells, MD   1 puff at 06/17/15 2029  . [START ON 06/18/2015] carvedilol (COREG) tablet 25 mg  25 mg Oral BID WC Nita Sells, MD      . Derrill Memo ON 06/18/2015] citalopram (CELEXA) tablet 20 mg  20 mg Oral Daily Nita Sells, MD      . cloNIDine (CATAPRES) tablet 0.2 mg  0.2 mg Oral TID Nita Sells, MD      . Derrill Memo ON 06/18/2015] febuxostat (ULORIC) tablet 40 mg  40 mg Oral Daily Nita Sells, MD      . Derrill Memo ON 06/18/2015] ferrous sulfate tablet 325 mg  325 mg Oral Daily Nita Sells, MD      . furosemide (LASIX) injection 40 mg  40 mg Intravenous Q12H Nita Sells, MD      . gabapentin (NEURONTIN) capsule 100 mg  100 mg Oral BID Nita Sells, MD      . heparin injection 5,000 Units  5,000 Units Subcutaneous 3 times per day Nita Sells, MD      . hydrALAZINE (APRESOLINE) tablet 50 mg  50 mg Oral 3 times per day Nita Sells, MD      . Derrill Memo ON 06/18/2015] isosorbide mononitrate (IMDUR) 24 hr tablet 45 mg  45 mg Oral Daily Nita Sells, MD      . methylPREDNISolone sodium succinate (SOLU-MEDROL) 40 mg/mL injection 40 mg  40 mg Intravenous Q12H Nita Sells, MD      . Derrill Memo ON 06/18/2015] omega-3 acid ethyl esters (LOVAZA) capsule 1 g  1 g Oral Daily Waldemar Dickens, MD      . ondansetron [facility]) injection 4 mg  4 mg Intravenous Q6H PRN Nita Sells, MD      . oxyCODONE-acetaminophen (PERCOCET/ROXICET) 5-325 MG per tablet 1 tablet  1 tablet Oral Q4H PRN Waldemar Dickens, MD       And  . oxyCODONE (Oxy IR/ROXICODONE) immediate release tablet 5 mg  5 mg Oral Q4H PRN Waldemar Dickens, MD      . oxyCODONE-acetaminophen (PERCOCET)  7.5-325 MG per  tablet 1 tablet  1 tablet Oral TID PRN Nita Sells, MD      . Derrill Memo ON 06/18/2015] pantoprazole (PROTONIX) EC tablet 40 mg  40 mg Oral Daily Nita Sells, MD      . Derrill Memo ON 06/18/2015] polyethylene glycol (MIRALAX / GLYCOLAX) packet 17 g  17 g Oral Daily Nita Sells, MD      . sodium chloride flush (NS) 0.9 % injection 3 mL  3 mL Intravenous Q12H Nita Sells, MD      . sodium chloride flush (NS) 0.9 % injection 3 mL  3 mL Intravenous PRN Nita Sells, MD        Allergies as of 06/17/2015 - Review Complete 06/17/2015  Allergen Reaction Noted  . Ace inhibitors Other (See Comments) 12/08/2014  . Niaspan [niacin er] Other (See Comments) 07/16/2010  . Other Rash 05/18/2015    Family History  Problem Relation Age of Onset  . Hypertension Mother   . Hypertension Father   . Diabetes Father   . COPD Father   . Heart disease Father   . Diabetes Sister     x 2  . Heart attack Sister     stent  . Heart disease Sister   . Colon cancer Neg Hx   . Colon polyps Neg Hx   . Esophageal cancer Neg Hx   . Skin cancer Sister   . Testicular cancer Brother     Social History   Social History  . Marital Status: Widowed    Spouse Name: N/A  . Number of Children: 71  . Years of Education: N/A   Occupational History  . Retired    Social History Main Topics  . Smoking status: Former Smoker -- 2.00 packs/day for 60 years    Types: Cigarettes  . Smokeless tobacco: Never Used     Comment: "quit smoking in ~ 2009"  . Alcohol Use: No     Comment: Used to smoke 3 packs per day until 2005  . Drug Use: No  . Sexual Activity: Not on file   Other Topics Concern  . Not on file   Social History Narrative   Has 9 children   Used to work in [employer] and family and with tobacco      Mother sudden death   Father COPD related death    Review of Systems: Gen: Denies any fever, chills, sweats, anorexia, fatigue, weakness, malaise, weight loss,  and sleep disorder HEENT: No visual complaints, No history of Retinopathy. Normal external appearance No Epistaxis or Sore throat. No sinusitis.   CV: Denies chest pain, angina, palpitations, syncope, orthopnea, PND, peripheral edema, and claudication. Resp:dyspnea on exercise  GI: Denies vomiting blood, jaundice, and fecal incontinence.   Denies dysphagia or odynophagia. GU : Denies urinary burning, blood in urine, urinary frequency, urinary hesitancy, nocturnal urination, and urinary incontinence.  No renal calculi. MS: Denies joint pain, limitation of movement, and swelling, stiffness, low back pain, extremity pain. Denies muscle weakness, cramps, atrophy.  No use of non steroidal antiinflammatory drugs. Derm: Denies rash, itching, dry skin, hives, moles, warts, or unhealing ulcers.  Psych: Denies depression, anxiety, memory loss, suicidal ideation, hallucinations, paranoia, and confusion. Heme: Denies bruising, bleeding, and enlarged lymph nodes. Neuro: No headache.  No diplopia. No dysarthria.  No dysphasia.  No history of CVA.  No Seizures. No paresthesias.  No weakness. Endocrine No DM.  No Thyroid disease.  No Adrenal disease.  Physical Exam: Vital signs in last 24 hours: Temp:  [  97.9 F (36.6 C)-98.2 F (36.8 C)] 98.2 F (36.8 C) (02/16 1937) Pulse Rate:  [66-73] 68 (02/16 1937) Resp:  [17-25] 18 (02/16 1937) BP: (114-176)/(51-99) 171/57 mmHg (02/16 1937) SpO2:  [85 %-99 %] 94 % (02/16 2034) Weight:  [104.327 kg (230 lb)-109.045 kg (240 lb 6.4 oz)] 109.045 kg (240 lb 6.4 oz) (02/16 1937)   General:   Alert,  Well-developed, well-nourished, pleasant and cooperative in NAD Head:  Normocephalic and atraumatic. Eyes:  Sclera clear, no icterus.   Conjunctiva pink. Ears:  Normal auditory acuity. Nose:  No deformity, discharge,  or lesions. Mouth:  No deformity or lesions, dentition normal. Neck:  Supple; no masses or thyromegaly. JVP not elevated Lungs:  Clear throughout to  auscultation.   No wheezes, crackles, or rhonchi. No acute distress. Heart:  Regular rate and rhythm; no murmurs, clicks, rubs,  or gallops. Abdomen:   Ascites and abdominal edema Msk:  Symmetrical without gross deformities. Normal posture. Pulses:  No carotid, renal, femoral bruits. DP and PT symmetrical and equal Extremities:  Without clubbing or edema. Neurologic:  Alert and  oriented x4;  grossly normal neurologically. Skin:  Intact without significant lesions or rashes. Cervical Nodes:  No significant cervical adenopathy. Psych:  Alert and cooperative. Normal mood and affect.  Intake/Output from previous day:   Intake/Output this shift:    Lab Results:  Recent Labs  06/17/15 1445  WBC 7.7  HGB 8.7*  HCT 28.6*  PLT 127*   BMET  Recent Labs  06/17/15 1445  NA 143  K 4.8  CL 109  CO2 23  GLUCOSE 114*  BUN 122*  CREATININE 2.84*  CALCIUM 8.5*   LFT  Recent Labs  06/17/15 1445  PROT 5.1*  ALBUMIN 2.3*  AST 23  ALT 17  ALKPHOS 41  BILITOT 0.5   PT/INR No results for input(s): LABPROT, INR in the last 72 hours. Hepatitis Panel No results for input(s): HEPBSAG, HCVAB, HEPAIGM, HEPBIGM in the last 72 hours.  Studies/Results: Dg Chest 2 View  06/17/2015  CLINICAL DATA:  Shortness of breath for several weeks EXAM: CHEST  2 VIEW COMPARISON:  05/31/2015 FINDINGS: Cardiac shadow is enlarged. Aortic calcifications are again noted. Lungs are well aerated but demonstrate evidence of small left effusion stable from the prior exam. Diffuse bilateral perihilar changes are now seen consistent with pulmonary edema. No pneumothorax is noted. No acute bony abnormality is seen. IMPRESSION: Diffuse increasing perihilar densities consistent with pulmonary edema. Electronically Signed   By: Inez Catalina M.D.   On: 06/17/2015 14:36    Assessment/Plan: 1. CKD stage 4 secondary to IgA nephropathy  Creatinine 2-3 's   Followed by Dr Lorrene Reid underwent renal biopsy in February.  Patient was admitted with volume overload. Consider steroids and immunouppression. Not sure of benefit at this point 2. HTN elevated today will continue diuresis 3. Diabetes stable 4. Anemia check iron studies  LOS: 0  Pamala Hayman W @TODAY @9 :47 PM

## 2015-06-17 NOTE — ED Provider Notes (Signed)
CSN: [id number]     Arrival date & time 06/17/15  1405 History   First MD Initiated Contact with Patient 06/17/15 1427     Chief Complaint  Patient presents with  . Shortness of Breath   (Consider location/radiation/quality/duration/timing/severity/associated sxs/prior Treatment) HPI 81 y.o. female with a hx of Diastolic CHF, DM, COPD, CKD IV, presents to the Emergency Department today due to shortness of breath and BLE swelling. States that she was seen by [facility] to get approval for home oxygen this morning. Nurse saw that her O2 sats were 83% on RA. Nurse told the family to call EMS and bring her to be evaluated in ED. En route, EMS put her on 4L O2 [place] and brought sats to 93%. Daughter gave Duoneb treatment at home with improvement as well. Pt was recently [facility] from hospital on 06-04-15 for AKI and Pneumonia. Given Vanc/Zosyn in [place] and [place] with [facility]. Takes 80mg  Lasix PO Daily. No CP/ABD pain. No fevers. No headaches. No numbness/tingling.    Appointment with Nephrologist on Monday.       Past Medical History  Diagnosis Date  . Hypertension   . COPD (chronic obstructive pulmonary disease) ([place])   . Neuromuscular disorder ([place])   . Depression with anxiety   . Osteopenia   . Arthritis   . GERD (gastroesophageal reflux disease)   . Hyperlipidemia   . Obesity   . Vitamin B12 deficiency   . Chronic kidney disease (CKD), stage IV (severe) ([place])   . Sleep apnea     no cpap use  . Anemia     iron infusion- x2 recent , last 5'16  . Hx of adenomatous colonic polyps 09/2014  . Diabetes mellitus without complication ([place])     type ll   dx about 30 yrs ago.  [name] HNP (herniated nucleus pulposus with myelopathy), thoracic   . Diastolic CHF ([place])   . Pneumonia 05/2015   Past Surgical History  Procedure Laterality Date  . Breast surgery  April 1996    needle biopsy   . Tubal ligation    . Cataract extraction, bilateral Bilateral   . Esophagogastroduodenoscopy N/A 10/19/2014     Procedure: ESOPHAGOGASTRODUODENOSCOPY (EGD);  Surgeon: Gatha Mayer, MD;  Location: [facility];  Service: Endoscopy;  Laterality: N/A;  . Colonoscopy N/A 10/19/2014    Procedure: COLONOSCOPY;  Surgeon: Gatha Mayer, MD;  Location: [facility];  Service: Endoscopy;  Laterality: N/A;  . Ercp N/A 04/22/2015    Procedure: ENDOSCOPIC RETROGRADE CHOLANGIOPANCREATOGRAPHY (ERCP);  Surgeon: Milus Banister, MD;  Location: [facility];  Service: Endoscopy;  Laterality: N/A;  . Laparoscopic cholecystectomy  05/25/2015  . Cholecystectomy N/A 05/25/2015    Procedure: LAPAROSCOPIC CHOLECYSTECTOMY;  Surgeon: Coralie Keens, MD;  Location: [facility];  Service: General;  Laterality: N/A;   Family History  Problem Relation Age of Onset  . Hypertension Mother   . Hypertension Father   . Diabetes Father   . COPD Father   . Heart disease Father   . Diabetes Sister     x 2  . Heart attack Sister     stent  . Heart disease Sister   . Colon cancer Neg Hx   . Colon polyps Neg Hx   . Esophageal cancer Neg Hx   . Skin cancer Sister   . Testicular cancer Brother    Social History  Substance Use Topics  . Smoking status: Former Smoker -- 2.00 packs/day for 60 years    Types:  Cigarettes  . Smokeless tobacco: Never Used     Comment: "quit smoking in ~ 2009"  . Alcohol Use: No   OB History    No data available     Review of Systems ROS reviewed and all are negative for acute change except as noted in the HPI.  Allergies  Ace inhibitors; Niaspan; and Other  Home Medications   Prior to Admission medications   Medication Sig Start Date End Date Taking? Authorizing Provider  acetaminophen (TYLENOL) 650 MG CR tablet Take 1,300 mg by mouth at bedtime.    Historical Provider, MD  Alcohol Swabs (PHARMACIST CHOICE ALCOHOL) PADS USE 2 TIMES DAILY FOR DIABETIC TESTING 12/09/14   Chipper Herb, MD  amLODipine (NORVASC) 10 MG tablet Take 1 tablet (10 mg total) by mouth daily. 06/11/15   Mary-Margaret Hassell Done,  FNP  aspirin 81 MG tablet Take 81 mg by mouth daily.      Historical Provider, MD  atorvastatin (LIPITOR) 20 MG tablet Take 1 tablet (20 mg total) by mouth daily. 06/11/15   Mary-Margaret Hassell Done, FNP  budesonide-formoterol (SYMBICORT) 80-4.5 MCG/ACT inhaler Inhale 1 puff into the lungs 2 (two) times daily. 11/26/14   Mary-Margaret Hassell Done, FNP  carvedilol (COREG) 25 MG tablet TAKE 1 TABLET BY MOUTH TWICE DAILY WITH A MEAL 12/10/14   Mary-Margaret Hassell Done, FNP  citalopram (CELEXA) 20 MG tablet TAKE 1 TABLET BY MOUTH EVERY DAY 12/10/14   Mary-Margaret Hassell Done, FNP  cloNIDine (CATAPRES) 0.2 MG tablet Take 1 tablet (0.2 mg total) by mouth 3 (three) times daily. 04/25/15   Thurnell Lose, MD  EASY TOUCH INSULIN SYRINGE 31G X 5/16" 1 ML MISC USE TO INJECT INSULIN TWICE DAILY 12/10/12   Mary-Margaret Hassell Done, FNP  EASY TOUCH PEN NEEDLES 31G X 8 MM MISC USE TWICE DAILY 04/30/15   Mary-Margaret Hassell Done, FNP  febuxostat (ULORIC) 40 MG tablet Take 1 tablet (40 mg total) by mouth daily. 11/26/14   Mary-Margaret Hassell Done, FNP  fenofibrate (TRICOR) 145 MG tablet Take 1 tablet (145 mg total) by mouth daily. 11/26/14   Mary-Margaret Hassell Done, FNP  Ferrous Sulfate (IRON) 325 (65 Fe) MG TABS Take 1 to 2 tabs po qd as directed 06/11/15   Mary-Margaret Hassell Done, FNP  furosemide (LASIX) 40 MG tablet Take 1 tablet (40 mg total) by mouth daily. 04/25/15   Thurnell Lose, MD  gabapentin (NEURONTIN) 300 MG capsule TAKE 1 CAPSULE BY MOUTH FOUR TIMES A DAY 03/26/15   Mary-Margaret Hassell Done, FNP  glucose blood (ACCU-CHEK AVIVA PLUS) test strip Test 4-5 x a day and prn dx E11.9 01/07/15   Mary-Margaret Hassell Done, FNP  glucose blood (ONETOUCH VERIO) test strip Test 4x a day and prn  E11.9 04/19/15   Mary-Margaret Hassell Done, FNP  glucose blood test strip Test 4x a day and prn  Dx E11.9 01/07/15   Mary-Margaret Hassell Done, FNP  hydrALAZINE (APRESOLINE) 50 MG tablet Take 1 tablet (50 mg total) by mouth every 8 (eight) hours. 06/12/15   Mary-Margaret Hassell Done, FNP   Insulin Lispro Prot & Lispro (HUMALOG MIX 75/25 KWIKPEN) (75-25) 100 UNIT/ML Kwikpen Inject 25u bid as directed 06/11/15   Mary-Margaret Hassell Done, FNP  isosorbide mononitrate (IMDUR) 30 MG 24 hr tablet Take 1.5 tablets (45 mg total) by mouth daily. 06/11/15   Mary-Margaret Hassell Done, FNP  Omega-3 Fatty Acids (FISH OIL) 1000 MG CAPS Take 1 capsule by mouth daily.    Historical Provider, MD  omeprazole (PRILOSEC) 40 MG capsule TAKE 1 CAPSULE BY MOUTH EVERY DAY 11/26/14   Mary-Margaret  Hassell Done, FNP  oxyCODONE-acetaminophen (PERCOCET) 10-325 MG tablet Take 1 tablet by mouth 2 (two) times daily. 04/30/15   Mary-Margaret Hassell Done, FNP  polyethylene glycol (MIRALAX / GLYCOLAX) packet Take 17 g by mouth daily. And an additional packet if needed    Historical Provider, MD  predniSONE (DELTASONE) 20 MG tablet Take 2 tablets (40 mg total) by mouth daily with breakfast. 06/12/15   Mary-Margaret Hassell Done, FNP   BP 167/55 mmHg  Pulse 71  Temp(Src) 97.9 F (36.6 C) (Oral)  Resp 25  Ht 5\' 4"  (1.626 m)  Wt 104.327 kg  BMI 39.46 kg/m2  SpO2 85%   Physical Exam  Constitutional: She is oriented to person, place, and time. She appears well-developed and well-nourished.  HENT:  Head: Normocephalic and atraumatic.  Eyes: EOM are normal. Pupils are equal, round, and reactive to light.  Neck: Trachea normal and normal range of motion. Neck supple. No JVD present.  Cardiovascular: Normal rate, regular rhythm, S1 normal, S2 normal, normal heart sounds, intact distal pulses and normal pulses.   Pulmonary/Chest: Effort normal. No respiratory distress. She has rhonchi in the right lower field and the left lower field. She exhibits no tenderness.  Abdominal: Soft.  Musculoskeletal: Normal range of motion.  3+ Pitting Edema BLE to Knee. Distal pulses intact. Motor/senstory intact.   Neurological: She is alert and oriented to person, place, and time.  Skin: Skin is warm and dry.  Psychiatric: She has a normal mood and affect. Her  behavior is normal. Thought content normal.  Nursing note and vitals reviewed.  ED Course  Procedures (including critical care time) Labs Review Labs Reviewed  COMPREHENSIVE METABOLIC PANEL - Abnormal; Notable for the following:    Glucose, Bld 114 (*)    BUN 122 (*)    Creatinine, Ser 2.84 (*)    Calcium 8.5 (*)    Total Protein 5.1 (*)    Albumin 2.3 (*)    GFR calc non Af Amer 15 (*)    GFR calc Af Amer 17 (*)    All other components within normal limits  BRAIN NATRIURETIC PEPTIDE - Abnormal; Notable for the following:    B Natriuretic Peptide 660.3 (*)    All other components within normal limits  TROPONIN I - Abnormal; Notable for the following:    Troponin I 0.06 (*)    All other components within normal limits  CBC WITH DIFFERENTIAL/PLATELET - Abnormal; Notable for the following:    RBC 3.14 (*)    Hemoglobin 8.7 (*)    HCT 28.6 (*)    RDW 17.4 (*)    Platelets 127 (*)    Lymphs Abs 0.2 (*)    All other components within normal limits  I-STAT CG4 LACTIC ACID, ED - Abnormal; Notable for the following:    Lactic Acid, Venous 0.45 (*)    All other components within normal limits  LIPASE, BLOOD   Imaging Review Dg Chest 2 View  06/17/2015  CLINICAL DATA:  Shortness of breath for several weeks EXAM: CHEST  2 VIEW COMPARISON:  05/31/2015 FINDINGS: Cardiac shadow is enlarged. Aortic calcifications are again noted. Lungs are well aerated but demonstrate evidence of small left effusion stable from the prior exam. Diffuse bilateral perihilar changes are now seen consistent with pulmonary edema. No pneumothorax is noted. No acute bony abnormality is seen. IMPRESSION: Diffuse increasing perihilar densities consistent with pulmonary edema. Electronically Signed   By: Inez Catalina M.D.   On: 06/17/2015 14:36   I have personally  reviewed and evaluated these images and lab results as part of my medical decision-making.   EKG Interpretation   Date/Time:  Thursday June 17 2015  14:09:40 EST Ventricular Rate:  72 PR Interval:  234 QRS Duration: 93 QT Interval:  398 QTC Calculation: 435 R Axis:   59 Text Interpretation:  Sinus rhythm Prolonged PR interval Low voltage,  extremity and precordial leads Sinus rhythm Low voltage QRS Artifact  Abnormal ekg Confirmed by Carmin Muskrat  MD ([id number]) on 06/17/2015 2:14:42  PM      MDM   I have reviewed relevant laboratory values. I have reviewed relevant imaging studies. I personally interpreted the relevant EKG. I have reviewed the relevant previous healthcare records. I have reviewed EMS Documentation. I obtained HPI from historian. Patient discussed with supervising physician  ED Course:  Assessment: [id number] F hx of Diastolic CHF, DM, COPD, CKD IV presents with SOB and BLE edema. Recently DCed from [place] on 06-04-15 for Pneumonia and AKI. On exam, Rhonchi noted bilateral lung fields. Heart RRR. BLE 3+ Pitting Edema to knee. Pt in NAD. VSS. Afebrile. O2 sat at 94% on 4L [place]. Given 40mg  IV lasix in ED. CXR shows pulm edema. Labs show positive troponin, which appears baseline. BNP 660. No leukocytosis. Cr 2.84. CXR shows pulm edema. Will consult Cardiology for possible admission for CHF exacerbation under medicine. Patient is DNR with documentation present.    Disposition/Plan:  Admit Pt acknowledges and agrees with plan  Supervising Physician Wandra Arthurs, MD   Final diagnoses:  Acute on chronic diastolic congestive heart failure [facility])        Shary Decamp, PA-C 06/17/15 1730  Wandra Arthurs, MD 06/17/15 [phone]

## 2015-06-17 NOTE — Consult Note (Signed)
Cardiology Consultation Note    Patient ID: Anna [name], MRN: [id number], DOB/AGE: [birth year] 81 y.o. Admit date: 06/17/2015   Date of Consult: 06/17/2015 Primary Physician: Chevis Pretty, FNP Primary Cardiologist: Remotely seen by Dr. Percival Spanish for risk stratification  Chief Complaint: SOB Reason for Consultation: pulmonary edema Requesting MD: Dr. Darl Householder  HPI: Anna [name] is a 81 y/o F with complex PMH including CKD stage III with recent progression to stage IV (and abnormal bx with IgA glomerulonephritis), HTN, COPD, IDDM, OSA, peripheral neuropathy, RLS, depression/anxiety, chronic back pain, dyslipidemia, anemia felt multifactorial (h/o IDA), chronic diastolic CHF, mild AS/MR by echo 04/2015, GERD whom we are asked to see for pulmonary edema. She has had complex history recently with multiple admissions. In 04/2015 she was admitted for acute biliary pancreatitis s/p ERCP, choledocholithiasis, and acute on chronic diastolic CHF with acute on chronic hypoxic respiratory failure. During that admission she was seen by cardiology for pre-op clearance and underwent stress-only Myoview that showed no evidence of ischemia or infarct, EF 71%. 2D echo 04/21/15: EF 55-60%, no RWMA, grade 2 DD, mild AS, mild MR, mod dilated LA. She was brought back 05/2015 for planned lap cholecystectomy and had uncomplicated admission. She was readmitted 1/25-06/04/15 with fever, cough, & SOB felt due to sepsis due to HCAP. Hospital course notable for AKI on CKD (with renal biopsy) and macular rash in upper extremity legs and back/skin vasculitis/ MCTD (with skin biopsy). Kidney biopsy showed "PROLIFERATIVE AND EARLY SCLEROSING IGA GLOMERULONEPHRITIS WITH 3% SEGMENTAL FIBROCELLULAR CRESCENT FORMATION (1 OF 36 GLOMERULI). MILD ARTERIOSCLEROSIS WITH FOCAL MILD TUBULOINTERSTITIAL SCARRING AND SUPERIMPOSED MILD ACUTE TUBULAR INJURY." She has not yet been back to her nephrologist to discuss these results - has appointment on  Monday.  She presented back to [facility] today with hypoxia. She says she wouldn't be here if it wasn't for the nurse recommendation at her living facility. Apparently she's had worsening SOB for the last week although she presently tells me her breathing is "better." Pulse ox was 86% RA by EMS per notes. She was placed on 4L with O2 sat up to 93%. She also reports significant LEE ever since December 2016's admission - again she says it's "better today" but her daughter at bedside vehemently disagrees. She has had orthopnea for several weeks. She denies any chest pain, syncope, BRBPR, melena, hematemesis, or decrease in urine output. She is unsure if she has gained any weight (the weight in computer is a stated weight). In the ER, labwork notable for BUN 122/Cr 2.84 (discharge Cr 3.76 on 2/3, as high as 4.2 on 1/27, previously in the 1.6-2 range in 2016), albumin 2.3, BNP 660, troponin 0.06, lactic acid 0.45, Hgb 8.7/Plt 127 (last 9.5/311). CXR shows diffuse increasing perihilar densities c/w pulmonary edema. The ER gave her 40mg  IV Lasix. She says she was told a few months ago she may need to consider dialysis at some point but has not begun any preparation for such.   Past Medical History  Diagnosis Date  . Hypertension   . COPD (chronic obstructive pulmonary disease) ([place])   . Neuromuscular disorder ([place])   . Depression with anxiety   . Osteopenia   . Arthritis   . GERD (gastroesophageal reflux disease)   . Hyperlipidemia   . Obesity   . Vitamin B12 deficiency   . Chronic kidney disease (CKD), stage IV (severe) ([place])   . Sleep apnea     no cpap use  . Anemia     iron  infusion- x2 recent , last 5'16  . Hx of adenomatous colonic polyps 09/2014  . Diabetes mellitus ([place])     type ll   dx about 30 yrs ago.  [name] HNP (herniated nucleus pulposus with myelopathy), thoracic   . Chronic diastolic CHF (congestive heart failure) ([place])   . Pneumonia 05/2015  . Peripheral neuropathy ([place])   . Aortic stenosis      a. Mild by echo 04/2015.  . Mitral regurgitation     a. Mild by echo 04/2015.  . Biliary acute pancreatitis     a. s/p ERCP 04/2015; subsequent cholecystectomy 05/2015.  . Macular rash     a. 06/2015: Per IM note, "acular rash in upper extremity legs and back/skin vasculitis/ MCTD (mixed connective tissue disease)."  . Acute renal insufficiency     a. Kidney bx 06/2015: "PROLIFERATIVE AND EARLY SCLEROSING IGA GLOMERULONEPHRITIS WITH 3% SEGMENTAL FIBROCELLULAR CRESCENT FORMATION (1 OF 36 GLOMERULI). MILD ARTERIOSCLEROSIS WITH FOCAL MILD TUBULOINTERSTITIAL SCARRING AND SUPERIMPOSED MILD ACUTE TUBULAR INJURY."  . Normal cardiac stress test 04/2015      Surgical History:  Past Surgical History  Procedure Laterality Date  . Breast surgery  April 1996    needle biopsy   . Tubal ligation    . Cataract extraction, bilateral Bilateral   . Esophagogastroduodenoscopy N/A 10/19/2014    Procedure: ESOPHAGOGASTRODUODENOSCOPY (EGD);  Surgeon: Gatha Mayer, MD;  Location: [facility];  Service: Endoscopy;  Laterality: N/A;  . Colonoscopy N/A 10/19/2014    Procedure: COLONOSCOPY;  Surgeon: Gatha Mayer, MD;  Location: [facility];  Service: Endoscopy;  Laterality: N/A;  . Ercp N/A 04/22/2015    Procedure: ENDOSCOPIC RETROGRADE CHOLANGIOPANCREATOGRAPHY (ERCP);  Surgeon: Milus Banister, MD;  Location: [place];  Service: Endoscopy;  Laterality: N/A;  . Laparoscopic cholecystectomy  05/25/2015  . Cholecystectomy N/A 05/25/2015    Procedure: LAPAROSCOPIC CHOLECYSTECTOMY;  Surgeon: Coralie Keens, MD;  Location: [place];  Service: General;  Laterality: N/A;     Home Meds: Prior to Admission medications   Medication Sig Start Date End Date Taking? Authorizing Provider  acetaminophen (TYLENOL) 650 MG CR tablet Take 1,300 mg by mouth at bedtime.   Yes Historical Provider, MD  albuterol (PROVENTIL HFA;VENTOLIN HFA) 108 (90 Base) MCG/ACT inhaler Inhale 1 puff into the lungs every 4 (four) hours as  needed for wheezing or shortness of breath.   Yes Historical Provider, MD  amLODipine (NORVASC) 10 MG tablet Take 1 tablet (10 mg total) by mouth daily. 06/11/15  Yes Mary-Margaret Hassell Done, FNP  arformoterol (BROVANA) 15 MCG/2ML NEBU Take 15 mcg by nebulization 2 (two) times daily.   Yes Historical Provider, MD  aspirin 81 MG tablet Take 81 mg by mouth daily.     Yes Historical Provider, MD  atorvastatin (LIPITOR) 20 MG tablet Take 1 tablet (20 mg total) by mouth daily. 06/11/15  Yes Mary-Margaret Hassell Done, FNP  budesonide-formoterol (SYMBICORT) 80-4.5 MCG/ACT inhaler Inhale 1 puff into the lungs 2 (two) times daily. 11/26/14  Yes Mary-Margaret Hassell Done, FNP  carvedilol (COREG) 25 MG tablet TAKE 1 TABLET BY MOUTH TWICE DAILY WITH A MEAL 12/10/14  Yes Mary-Margaret Hassell Done, FNP  citalopram (CELEXA) 20 MG tablet TAKE 1 TABLET BY MOUTH EVERY DAY 12/10/14  Yes Mary-Margaret Hassell Done, FNP  cloNIDine (CATAPRES) 0.2 MG tablet Take 1 tablet (0.2 mg total) by mouth 3 (three) times daily. 04/25/15  Yes Thurnell Lose, MD  febuxostat (ULORIC) 40 MG tablet Take 1 tablet (40 mg total) by mouth daily. 11/26/14  Yes  Mary-Margaret Hassell Done, FNP  fenofibrate (TRICOR) 145 MG tablet Take 1 tablet (145 mg total) by mouth daily. 11/26/14  Yes Mary-Margaret Hassell Done, FNP  Ferrous Sulfate (IRON) 325 (65 Fe) MG TABS Take 1 to 2 tabs po qd as directed Patient taking differently: Take 1 tablet by mouth daily. Take 1 to 2 tabs po qd as directed 06/11/15  Yes Mary-Margaret Hassell Done, FNP  furosemide (LASIX) 40 MG tablet Take 1 tablet (40 mg total) by mouth daily. 04/25/15  Yes Thurnell Lose, MD  gabapentin (NEURONTIN) 300 MG capsule TAKE 1 CAPSULE BY MOUTH FOUR TIMES A DAY 03/26/15  Yes Mary-Margaret Hassell Done, FNP  hydrALAZINE (APRESOLINE) 50 MG tablet Take 1 tablet (50 mg total) by mouth every 8 (eight) hours. 06/12/15  Yes Mary-Margaret Hassell Done, FNP  Insulin Lispro Prot & Lispro (HUMALOG MIX 75/25 KWIKPEN) (75-25) 100 UNIT/ML Kwikpen Inject 25u  bid as directed Patient taking differently: Inject 25 Units into the skin. Inject 25u bid as directed 06/11/15  Yes Mary-Margaret Hassell Done, FNP  isosorbide mononitrate (IMDUR) 30 MG 24 hr tablet Take 1.5 tablets (45 mg total) by mouth daily. 06/11/15  Yes Mary-Margaret Hassell Done, FNP  Omega-3 Fatty Acids (FISH OIL) 1000 MG CAPS Take 1 capsule by mouth daily.   Yes Historical Provider, MD  omeprazole (PRILOSEC) 40 MG capsule TAKE 1 CAPSULE BY MOUTH EVERY DAY 11/26/14  Yes Mary-Margaret Hassell Done, FNP  oxyCODONE-acetaminophen (PERCOCET) 7.5-325 MG tablet Take 1 tablet by mouth 3 (three) times daily as needed for moderate pain or severe pain.   Yes Historical Provider, MD  polyethylene glycol (MIRALAX / GLYCOLAX) packet Take 17 g by mouth daily. And an additional packet if needed   Yes Historical Provider, MD  Alcohol Swabs (PHARMACIST CHOICE ALCOHOL) PADS USE 2 TIMES DAILY FOR DIABETIC TESTING 12/09/14   Chipper Herb, MD  EASY [facility] INSULIN SYRINGE 31G X 5/16" 1 ML MISC USE TO INJECT INSULIN TWICE DAILY 12/10/12   Mary-Margaret Hassell Done, FNP  EASY TOUCH PEN NEEDLES 31G X 8 MM MISC USE TWICE DAILY 04/30/15   Mary-Margaret Hassell Done, FNP  glucose blood (ACCU-CHEK AVIVA PLUS) test strip Test 4-5 x a day and prn dx E11.9 01/07/15   Mary-Margaret Hassell Done, FNP  glucose blood (ONETOUCH VERIO) test strip Test 4x a day and prn  E11.9 04/19/15   Mary-Margaret Hassell Done, FNP  glucose blood test strip Test 4x a day and prn  Dx E11.9 01/07/15   Mary-Margaret Hassell Done, FNP  oxyCODONE-acetaminophen (PERCOCET) 10-325 MG tablet Take 1 tablet by mouth 2 (two) times daily. Patient not taking: Reported on 06/17/2015 04/30/15   Mary-Margaret Hassell Done, FNP  predniSONE (DELTASONE) 20 MG tablet Take 2 tablets (40 mg total) by mouth daily with breakfast. Patient not taking: Reported on 06/17/2015 06/12/15   Mary-Margaret Hassell Done, FNP    Inpatient Medications:     Allergies:  Allergies  Allergen Reactions  . Ace Inhibitors Other (See Comments)     Kidney failure  . Niaspan [Niacin Er] Other (See Comments)    flushing  . Other Rash    Pt started Norvasc, clonidine, and isosorbide on 04-26-15.  Not sure which one is causing the rash.  Pls update after pt goes to MD today    Social History   Social History  . Marital Status: Widowed    Spouse Name: N/A  . Number of Children: 77  . Years of Education: N/A   Occupational History  . Retired    Social History Main Topics  . Smoking status: Former Smoker --  2.00 packs/day for 60 years    Types: Cigarettes  . Smokeless tobacco: Never Used     Comment: "quit smoking in ~ 2009"  . Alcohol Use: No  . Drug Use: No  . Sexual Activity: Not on file   Other Topics Concern  . Not on file   Social History Narrative   Widowed 1 son and 1 daughter   Retired   No caffeine     Family History  Problem Relation Age of Onset  . Hypertension Mother   . Hypertension Father   . Diabetes Father   . COPD Father   . Heart disease Father   . Diabetes Sister     x 2  . Heart attack Sister     stent  . Heart disease Sister   . Colon cancer Neg Hx   . Colon polyps Neg Hx   . Esophageal cancer Neg Hx   . Skin cancer Sister   . Testicular cancer Brother      Review of Systems: All other systems reviewed and are otherwise negative except as noted above.  Labs:  Recent Labs  06/17/15 1445  TROPONINI 0.06*   Lab Results  Component Value Date   WBC 7.7 06/17/2015   HGB 8.7* 06/17/2015   HCT 28.6* 06/17/2015   MCV 91.1 06/17/2015   PLT 127* 06/17/2015     Recent Labs Lab 06/17/15 1445  NA 143  K 4.8  CL 109  CO2 23  BUN 122*  CREATININE 2.84*  CALCIUM 8.5*  PROT 5.1*  BILITOT 0.5  ALKPHOS 41  ALT 17  AST 23  GLUCOSE 114*   Lab Results  Component Value Date   CHOL 104 02/26/2015   HDL 21* 02/26/2015   LDLCALC 27 02/26/2015   TRIG 279* 02/26/2015   Lab Results  Component Value Date   DDIMER 2.52* 04/23/2015    Radiology/Studies:  Dg Chest 2  View  06/17/2015  CLINICAL DATA:  Shortness of breath for several weeks EXAM: CHEST  2 VIEW COMPARISON:  05/31/2015 FINDINGS: Cardiac shadow is enlarged. Aortic calcifications are again noted. Lungs are well aerated but demonstrate evidence of small left effusion stable from the prior exam. Diffuse bilateral perihilar changes are now seen consistent with pulmonary edema. No pneumothorax is noted. No acute bony abnormality is seen. IMPRESSION: Diffuse increasing perihilar densities consistent with pulmonary edema. Electronically Signed   By: Inez Catalina M.D.   On: 06/17/2015 14:36   Dg Chest 2 View  05/31/2015  CLINICAL DATA:  Shortness of Breath EXAM: CHEST  2 VIEW COMPARISON:  05/26/2015 FINDINGS: Cardiomediastinal silhouette is stable. Persistent left small pleural effusion with left basilar atelectasis or infiltrate. New linear atelectasis or early infiltrate in lingula. Right lung is clear. No pulmonary edema. IMPRESSION: Persistent left small pleural effusion with left basilar atelectasis or infiltrate. New linear atelectasis or early infiltrate in lingula. Right lung is clear. Electronically Signed   By: Lahoma Crocker M.D.   On: 05/31/2015 09:55   Dg Chest 2 View  05/26/2015  CLINICAL DATA:  Fever and hypoxia. EXAM: CHEST  2 VIEW COMPARISON:  04/23/2015 FINDINGS: The cardiac silhouette is enlarged. Mediastinal contours appear intact. Torturous knee and atherosclerotic disease of the aorta are seen. There is no evidence of pneumothorax. Low lung volumes. Mild pulmonary vascular congestion. There is a patchy left lower lobe airspace consolidation. There is a probable small left pleural effusion. Osseous structures are without acute abnormality. Soft tissues are grossly normal. IMPRESSION: Low  lung volumes with patchy left lower lobe airspace consolidation and small left pleural effusion, which may represent developing pneumonia. Mild pulmonary vascular congestion. Electronically Signed   By: Fidela Salisbury M.D.   On: 05/26/2015 22:52   US Renal  05/27/2015  CLINICAL DATA:  Acute kidney injury. Bilateral renal atrophy seen on previous CT and ultrasound examinations. EXAM: RENAL / URINARY TRACT ULTRASOUND COMPLETE COMPARISON:  CT abdomen and pelvis 04/20/2015 FINDINGS: Right Kidney: Length: 12.6 cm. Mild diffuse parenchymal thinning. No solid mass or hydronephrosis. Left Kidney: Left kidney is not visualized due to overlying bowel gas. Unable to reposition patient for better visualization. Bladder: No bladder wall thickening or intraluminal filling defects. IMPRESSION: Mild diffuse parenchymal atrophy in the right kidney. Left kidney is not visualized due to bowel gas. Electronically Signed   By: Lucienne Capers M.D.   On: 05/27/2015 02:17   US Biopsy  06/03/2015  CLINICAL DATA:  Acute on chronic renal failure. The patient requires renal biopsy. EXAM: ULTRASOUND GUIDED CORE BIOPSY OF LEFT KIDNEY MEDICATIONS: 1.0 mg IV Versed; 25 mcg IV Fentanyl Total Moderate Sedation Time: 10 minutes. The patient's level of consciousness and physiologic status were continuously monitored during the procedure by Radiology nursing. PROCEDURE: The procedure, risks, benefits, and alternatives were explained to the patient. Questions regarding the procedure were encouraged and answered. The patient understands and consents to the procedure. A time-out was performed prior to the procedure. The left flank region was prepped with chlorhexidine in a sterile fashion, and a sterile drape was applied covering the operative field. A sterile gown and sterile gloves were used for the procedure. Local anesthesia was provided with 1% Lidocaine. Ultrasound was used to localize the left kidney. Under direct ultrasound guidance, a 16 gauge core biopsy device was advanced. Three separate core biopsy samples were obtained of lower pole cortex. Core samples were submitted in saline. COMPLICATIONS: None. FINDINGS: Preliminary imaging shows  much better visualization of the left kidney compared to the right from a posterior approach. Solid tissue was obtained with biopsy. IMPRESSION: Ultrasound-guided core biopsy performed of the left kidney at the level of lower pole cortex. Electronically Signed   By: Aletta Edouard M.D.   On: 06/03/2015 11:57    Wt Readings from Last 3 Encounters:  06/17/15 230 lb (104.327 kg)  06/04/15 227 lb 4.7 oz (103.1 kg)  05/25/15 226 lb (102.513 kg)    EKG:  06/17/15: NSR 72bpm, 1st degree AVB, low voltage throughout but otherwise no acute ST-T changes  Physical Exam: Blood pressure 139/51, pulse 66, temperature 97.9 F (36.6 C), temperature source Oral, resp. rate 23, height 5\' 4"  (1.626 m), weight 230 lb (104.327 kg), SpO2 94 %. Body mass index is 39.46 kg/(m^2). General: Well developed pale morbidly obese WF in no acute distress on [place] O2 Head: Normocephalic, atraumatic, sclera non-icteric, no xanthomas, nares are without discharge.  Neck: Negative for carotid bruits. JVD not elevated. Lungs: Crackles at bases bilaterally without wheezes or rhonchi. Breathing is unlabored. Note scattered petechiae noted on back. Heart: RRR with S1 S2. No murmurs, rubs, or gallops appreciated. Abdomen: Soft, non-tender, non-distended with normoactive bowel sounds. No hepatomegaly. No rebound/guarding. No obvious abdominal masses. Msk:  Strength and tone appear normal for age. Extremities: No clubbing or cyanosis. 3+ pitting pale BLE edema superimposed on large baseline leg habitus. This makes distal pulses difficult to palpate, but LE are warm. Neuro: Alert and oriented X 3. No facial asymmetry. No focal deficit. Moves all extremities spontaneously. Psych:  Responds to questions appropriately with a normal affect.     Assessment and Plan  25F with CKD stage III with recent progression to stage IV (and abnormal bx with IgA glomerulonephritis), HTN, COPD, IDDM, OSA (intolerant of CPAP), peripheral neuropathy, RLS,  depression/anxiety, chronic back pain, dyslipidemia, anemia felt multifactorial (h/o IDA), chronic diastolic CHF, nonischemic nuc 04/2015, mild AS/MR by echo 04/2015, GERD whom we are asked to see for pulmonary edema. Labs notable for BUN 122/Cr 2.84 (discharge Cr 3.76 on 2/3, as high as 4.2 on 1/27, previously in the 1.6-2 range in 2016), albumin 2.3, BNP 660, troponin 0.06, lactic acid 0.45, Hgb 8.7/Plt 127 (last 9.5/311).  1. Volume overload with BLE edema and pulmonary edema, suspected multifactorial due to CKD stage IV, hypoalbuminemia, and acute on chronic diastolic CHF - have preliminarily reviewed case with cardiology MD who agrees patient would be best served with medical admission. Would recommend consideration of renal consultation regarding diuretics given poor renal function. BUN remains significantly elevated, Cr slightly improved from last admission but still above baseline in 2016. Albumin low at 2.3.   2. Acute on chronic anemia - per IM. Previously felt multifactorial.  3. Minimally elevated troponin - nonspecific in setting of renal failure and hypoxia. She denies any chest pain. EKG without acute changes. Recent nuclear stress test was nonischemic and EF was normal. Not presently a candidate for more invasive workup given renal failure, nor does current state seem to warrant this. Continue risk reduction measures.  4. CKD stage IV with recent abnormal renal bx showing IgA glomerunephritis - strongly consider consulting renal. She was supposed to see them on 2/27.  Signed, Charlie Pitter PA-C 06/17/2015, 5:08 PM Pager: [phone]

## 2015-06-17 NOTE — ED Provider Notes (Signed)
Orders placed on the patient's arrival to the emergency department, but she soon went to x-ray, and full screening exam was not completed. On transfer, she appeared in no distress. The remainder of the evaluation will be conducted by colleagues.   EKG Interpretation  Date/Time:  Thursday June 17 2015 14:09:40 EST Ventricular Rate:  72 PR Interval:  234 QRS Duration: 93 QT Interval:  398 QTC Calculation: 435 R Axis:   59 Text Interpretation:  Sinus rhythm Prolonged PR interval Low voltage, extremity and precordial leads Sinus rhythm Low voltage QRS Artifact Abnormal ekg Confirmed by Carmin Muskrat  MD ([id number]) on 06/17/2015 2:14:42 PM        Carmin Muskrat, MD 06/17/15 1427

## 2015-06-17 NOTE — H&P (Signed)
[facility] History and Physical  Anna [name] [id number] DOB: [birth year] DOA: 06/17/2015  Referring physician: ED PCP: Chevis Pretty, FNP  Specialists: Cardiology  Chief Complaint: Acute shortness of breath  HPI:  81 y/o ? COPD not on home oxygen AKI/Stage iv-v CKD AORD/Anemia renal disease Mixed connective tissue disease follwed by derm [name] neg] OSA-needs sleep study Htn IDDM with neuropathy HLD COPD Bipolar\ GERD Myoview 04/24/15 EF 71% -Admitted 12/20-12/25 for GS pancreatitis and had ERCP-S/p Lap Chole 05/23/15 Prior colonoscopy 09/2014-5 polyps [adenoma] EGD was neg for source bleed Prior Degen disck disease s/p injections  Recent admisson CAP  Also found to have Worsening creatinine US guided biopsy kidney=IGA GN,cresenctic-sent home on Prednisone 40  Patient was discharged to the [facility] and returned home on 06/11/15 and ever since return home patient has been drinking one to 224 ounce containers of water and has been noncompliant with salt restriction. It appears on discharge patient was discharged on Lasix 40 mg and because of progressive shortness of breath noted by home health RN as well as family they called nurse practitioner who recommended to double this dose to 80 mg of Lasix.  Home health RN saw the patient this morning 2/16 and noted patient is hypoxic into the 80s and visibly short of breath. Patient was given Lasix in the emergency room and had significant relief and has passed significant amounts of urine and respiratory status feels better  She is satting in the 90s on 4 L of oxygen  Patient states swelling has been mainly in the feet and does not endorse any tightness of her abdomen however at baseline she weighs 104 kg  She has not been weighing herself at home  Does not endorse chest pain Fever Chills   nausea Vomiting Dark stool Tarry stool Unilateral weakness Blurred vision Double vision Abdominal pain Other  complaint at present time  Emergency room workup significant for BUN/creatinine 122/2.8 GFR 17, baseline GFR is around 11-12 Albumin noted 2.3, total protein 5.1 BNP 660 40 care troponin 0.06 Lactic acid 0.4 Hemoglobin is 8.7 platelet count 127 Chest x-ray concerning for diffuse increasing perihilar densities consistent with pulmonary edema   Past Medical History  Diagnosis Date  . Hypertension   . COPD (chronic obstructive pulmonary disease) ([place])   . Neuromuscular disorder ([place])   . Depression with anxiety   . Osteopenia   . Arthritis   . GERD (gastroesophageal reflux disease)   . Hyperlipidemia   . Obesity   . Vitamin B12 deficiency   . Chronic kidney disease (CKD), stage IV (severe) ([place])   . Sleep apnea     no cpap use  . Anemia     iron infusion- x2 recent , last 5'16  . Hx of adenomatous colonic polyps 09/2014  . Diabetes mellitus ([place])     type ll   dx about 30 yrs ago.  [name] HNP (herniated nucleus pulposus with myelopathy), thoracic   . Chronic diastolic CHF (congestive heart failure) ([place])   . Pneumonia 05/2015  . Peripheral neuropathy ([place])   . Aortic stenosis     a. Mild by echo 04/2015.  . Mitral regurgitation     a. Mild by echo 04/2015.  . Biliary acute pancreatitis     a. s/p ERCP 04/2015; subsequent cholecystectomy 05/2015.  . Macular rash     a. 06/2015: Per IM note, "acular rash in upper extremity legs and back/skin vasculitis/ MCTD (mixed connective tissue disease)."  . Acute renal insufficiency  a. Kidney bx 06/2015: "PROLIFERATIVE AND EARLY SCLEROSING IGA GLOMERULONEPHRITIS WITH 3% SEGMENTAL FIBROCELLULAR CRESCENT FORMATION (1 OF 36 GLOMERULI). MILD ARTERIOSCLEROSIS WITH FOCAL MILD TUBULOINTERSTITIAL SCARRING AND SUPERIMPOSED MILD ACUTE TUBULAR INJURY."  . Normal cardiac stress test 04/2015   Past Surgical History  Procedure Laterality Date  . Breast surgery  April 1996    needle biopsy   . Tubal ligation    . Cataract extraction, bilateral  Bilateral   . Esophagogastroduodenoscopy N/A 10/19/2014    Procedure: ESOPHAGOGASTRODUODENOSCOPY (EGD);  Surgeon: Gatha Mayer, MD;  Location: [facility];  Service: Endoscopy;  Laterality: N/A;  . Colonoscopy N/A 10/19/2014    Procedure: COLONOSCOPY;  Surgeon: Gatha Mayer, MD;  Location: [facility];  Service: Endoscopy;  Laterality: N/A;  . Ercp N/A 04/22/2015    Procedure: ENDOSCOPIC RETROGRADE CHOLANGIOPANCREATOGRAPHY (ERCP);  Surgeon: Milus Banister, MD;  Location: [place];  Service: Endoscopy;  Laterality: N/A;  . Laparoscopic cholecystectomy  05/25/2015  . Cholecystectomy N/A 05/25/2015    Procedure: LAPAROSCOPIC CHOLECYSTECTOMY;  Surgeon: Coralie Keens, MD;  Location: [facility] OR;  Service: General;  Laterality: N/A;   Social History:  Social History   Social History Narrative   Has 9 children   Used to work in [employer] and family and with tobacco      Mother sudden death   Father COPD related death    Allergies  Allergen Reactions  . Ace Inhibitors Other (See Comments)    Kidney failure  . Niaspan [Niacin Er] Other (See Comments)    flushing  . Other Rash    Pt started Norvasc, clonidine, and isosorbide on 04-26-15.  Not sure which one is causing the rash.  Pls update after pt goes to MD today    Family History  Problem Relation Age of Onset  . Hypertension Mother   . Hypertension Father   . Diabetes Father   . COPD Father   . Heart disease Father   . Diabetes Sister     x 2  . Heart attack Sister     stent  . Heart disease Sister   . Colon cancer Neg Hx   . Colon polyps Neg Hx   . Esophageal cancer Neg Hx   . Skin cancer Sister   . Testicular cancer Brother   )  Prior to Admission medications   Medication Sig Start Date End Date Taking? Authorizing Provider  acetaminophen (TYLENOL) 650 MG CR tablet Take 1,300 mg by mouth at bedtime.   Yes Historical Provider, MD  albuterol (PROVENTIL HFA;VENTOLIN HFA) 108 (90 Base) MCG/ACT inhaler Inhale 1 puff  into the lungs every 4 (four) hours as needed for wheezing or shortness of breath.   Yes Historical Provider, MD  amLODipine (NORVASC) 10 MG tablet Take 1 tablet (10 mg total) by mouth daily. 06/11/15  Yes Mary-Margaret Hassell Done, FNP  arformoterol (BROVANA) 15 MCG/2ML NEBU Take 15 mcg by nebulization 2 (two) times daily.   Yes Historical Provider, MD  aspirin 81 MG tablet Take 81 mg by mouth daily.     Yes Historical Provider, MD  atorvastatin (LIPITOR) 20 MG tablet Take 1 tablet (20 mg total) by mouth daily. 06/11/15  Yes Mary-Margaret Hassell Done, FNP  budesonide-formoterol (SYMBICORT) 80-4.5 MCG/ACT inhaler Inhale 1 puff into the lungs 2 (two) times daily. 11/26/14  Yes Mary-Margaret Hassell Done, FNP  carvedilol (COREG) 25 MG tablet TAKE 1 TABLET BY MOUTH TWICE DAILY WITH A MEAL 12/10/14  Yes Mary-Margaret Hassell Done, FNP  citalopram (CELEXA) 20 MG tablet TAKE 1  TABLET BY MOUTH EVERY DAY 12/10/14  Yes Mary-Margaret Hassell Done, FNP  cloNIDine (CATAPRES) 0.2 MG tablet Take 1 tablet (0.2 mg total) by mouth 3 (three) times daily. 04/25/15  Yes Thurnell Lose, MD  febuxostat (ULORIC) 40 MG tablet Take 1 tablet (40 mg total) by mouth daily. 11/26/14  Yes Mary-Margaret Hassell Done, FNP  fenofibrate (TRICOR) 145 MG tablet Take 1 tablet (145 mg total) by mouth daily. 11/26/14  Yes Mary-Margaret Hassell Done, FNP  Ferrous Sulfate (IRON) 325 (65 Fe) MG TABS Take 1 to 2 tabs po qd as directed Patient taking differently: Take 1 tablet by mouth daily. Take 1 to 2 tabs po qd as directed 06/11/15  Yes Mary-Margaret Hassell Done, FNP  furosemide (LASIX) 40 MG tablet Take 1 tablet (40 mg total) by mouth daily. 04/25/15  Yes Thurnell Lose, MD  gabapentin (NEURONTIN) 300 MG capsule TAKE 1 CAPSULE BY MOUTH FOUR TIMES A DAY 03/26/15  Yes Mary-Margaret Hassell Done, FNP  hydrALAZINE (APRESOLINE) 50 MG tablet Take 1 tablet (50 mg total) by mouth every 8 (eight) hours. 06/12/15  Yes Mary-Margaret Hassell Done, FNP  Insulin Lispro Prot & Lispro (HUMALOG MIX 75/25 KWIKPEN)  (75-25) 100 UNIT/ML Kwikpen Inject 25u bid as directed Patient taking differently: Inject 25 Units into the skin. Inject 25u bid as directed 06/11/15  Yes Mary-Margaret Hassell Done, FNP  isosorbide mononitrate (IMDUR) 30 MG 24 hr tablet Take 1.5 tablets (45 mg total) by mouth daily. 06/11/15  Yes Mary-Margaret Hassell Done, FNP  Omega-3 Fatty Acids (FISH OIL) 1000 MG CAPS Take 1 capsule by mouth daily.   Yes Historical Provider, MD  omeprazole (PRILOSEC) 40 MG capsule TAKE 1 CAPSULE BY MOUTH EVERY DAY 11/26/14  Yes Mary-Margaret Hassell Done, FNP  oxyCODONE-acetaminophen (PERCOCET) 7.5-325 MG tablet Take 1 tablet by mouth 3 (three) times daily as needed for moderate pain or severe pain.   Yes Historical Provider, MD  polyethylene glycol (MIRALAX / GLYCOLAX) packet Take 17 g by mouth daily. And an additional packet if needed   Yes Historical Provider, MD  Alcohol Swabs (PHARMACIST CHOICE ALCOHOL) PADS USE 2 TIMES DAILY FOR DIABETIC TESTING 12/09/14   Chipper Herb, MD  EASY [facility] INSULIN SYRINGE 31G X 5/16" 1 ML MISC USE TO INJECT INSULIN TWICE DAILY 12/10/12   Mary-Margaret Hassell Done, FNP  EASY TOUCH PEN NEEDLES 31G X 8 MM MISC USE TWICE DAILY 04/30/15   Mary-Margaret Hassell Done, FNP  glucose blood (ACCU-CHEK AVIVA PLUS) test strip Test 4-5 x a day and prn dx E11.9 01/07/15   Mary-Margaret Hassell Done, FNP  glucose blood (ONETOUCH VERIO) test strip Test 4x a day and prn  E11.9 04/19/15   Mary-Margaret Hassell Done, FNP  glucose blood test strip Test 4x a day and prn  Dx E11.9 01/07/15   Mary-Margaret Hassell Done, FNP  oxyCODONE-acetaminophen (PERCOCET) 10-325 MG tablet Take 1 tablet by mouth 2 (two) times daily. Patient not taking: Reported on 06/17/2015 04/30/15   Mary-Margaret Hassell Done, FNP  predniSONE (DELTASONE) 20 MG tablet Take 2 tablets (40 mg total) by mouth daily with breakfast. Patient not taking: Reported on 06/17/2015 06/12/15   Mary-Margaret Hassell Done, FNP   Physical Exam: Filed Vitals:   06/17/15 1515 06/17/15 1530 06/17/15 1545 06/17/15  1600  BP: 162/53 149/53 114/99 139/51  Pulse: 70 68 66 66  Temp:      TempSrc:      Resp: 17 24 20 23   Height:      Weight:      SpO2: 95% 94% 93% 94%   EOMI, ncat, PERRLA NO JVD  S1 S2 no M/R/G Abd soft NT ND--noted ascites and pitting abdominal edema from lower extremities up to abdomen No asterixis Chest is clinically clear no added sound Moving all 4 limbs equally without deficit Reflexes are 3/5, power is intact, smile is symmetrical Rash bilaterally on the arms but fine mild   Labs on Admission:  Basic Metabolic Panel:  Recent Labs Lab 06/17/15 1445  NA 143  K 4.8  CL 109  CO2 23  GLUCOSE 114*  BUN 122*  CREATININE 2.84*  CALCIUM 8.5*   Liver Function Tests:  Recent Labs Lab 06/17/15 1445  AST 23  ALT 17  ALKPHOS 41  BILITOT 0.5  PROT 5.1*  ALBUMIN 2.3*    Recent Labs Lab 06/17/15 1445  LIPASE 50   No results for input(s): AMMONIA in the last 168 hours. CBC:  Recent Labs Lab 06/17/15 1445  WBC 7.7  NEUTROABS 7.3  HGB 8.7*  HCT 28.6*  MCV 91.1  PLT 127*   Cardiac Enzymes:  Recent Labs Lab 06/17/15 1445  TROPONINI 0.06*    BNP (last 3 results)  Recent Labs  04/23/15 0008 05/27/15 0602 06/17/15 1445  BNP 298.3* 376.7* 660.3*    ProBNP (last 3 results) No results for input(s): PROBNP in the last 8760 hours.  CBG: No results for input(s): GLUCAP in the last 168 hours.  Radiological Exams on Admission: Dg Chest 2 View  06/17/2015  CLINICAL DATA:  Shortness of breath for several weeks EXAM: CHEST  2 VIEW COMPARISON:  05/31/2015 FINDINGS: Cardiac shadow is enlarged. Aortic calcifications are again noted. Lungs are well aerated but demonstrate evidence of small left effusion stable from the prior exam. Diffuse bilateral perihilar changes are now seen consistent with pulmonary edema. No pneumothorax is noted. No acute bony abnormality is seen. IMPRESSION: Diffuse increasing perihilar densities consistent with pulmonary edema.  Electronically Signed   By: Inez Catalina M.D.   On: 06/17/2015 14:36    EKG: Independently reviewed. Sinus rhythm, PR interval 0.08, QRS axis 50 no ST-T wave changes  Assessment/Plan Acute respiratory failure with hypoxia (HCC)-   Secondary to Acute likely diastolic heart failure complicated by IgA nephropathy and Anasarca  BNP not very specific in obese patients and troponin elevated secondary to renal disease  Diuresis with Lasix 40 mg IV twice a day which is double her home dose Obtain stat urine analysis to determine protein and may need 24-hour protein Will need steroids + ACE inhibitor + Fish oil for IgA nephropathy and may benefit from immunomodulator  Defer to nephrology either intermittent dosing versus continued dosing of prednisone--will continue for now prednisone 40 every morning  Elevated troponin No further workup EKG benign and no chest pain    COPD (chronic obstructive pulmonary disease) (HCC) -Clinically her symptoms not consistent with acute exacerbation, continue albuterol inhaler every 4 when necessary, Brovana 15 g twice a day, Symbicort 80/4.5 one puff twice a day   Diabetic neuropathy (HCC)-decreased dose of gabapentin from 300 3 times a day to 100 twice a day even renal insufficiency   Depression with anxiety-continue citalopram 20 mg daily   Gout-continue using lower 40 mg HTn-on multiple agents which we will continue inclusive of hydralazine 50 3 times a day, clonidine 0.2 3 times a day, Coreg 1 twice a day, amlodipine 10 daily, Imdur 45 daily--not sure how compliant she is   Hypoalbuminemia-see above discussion. Patient has low protein and low albumin likely secondary to IgA nephropathy.   CKD (chronic kidney disease), stage  IV (HCC)See above discussion-   Anemia, unspecifiedLikely anemia of chronic disease. Patient has prior history of polyps  Anticipate full code   anticipate 3-4 days admission Will need workup by nephrology and appreciate input from  cardiology  Time spent: [place], [facility] Pager [phone]  If 7PM-7AM, please contact night-coverage [email or web address] Password [facility] 06/17/2015, 5:59 PM

## 2015-06-17 NOTE — ED Notes (Signed)
[facility]- pt here today for shortness of breath. Pt has had increasing shortness of breath for the last week. Pt also has swelling noted bilaterally in the feet. Pt initially 86% on RA with EMS. Given 2 duoneb treatments with lung sound improvement. Pt on 4L of O2 on arrival with O2 saturation of 93%.

## 2015-06-18 ENCOUNTER — Ambulatory Visit: Payer: Medicare Other | Admitting: Nurse Practitioner

## 2015-06-18 DIAGNOSIS — I110 Hypertensive heart disease with heart failure: Secondary | ICD-10-CM

## 2015-06-18 DIAGNOSIS — I248 Other forms of acute ischemic heart disease: Secondary | ICD-10-CM

## 2015-06-18 DIAGNOSIS — E8770 Fluid overload, unspecified: Secondary | ICD-10-CM

## 2015-06-18 LAB — GLUCOSE, CAPILLARY
GLUCOSE-CAPILLARY: 288 mg/dL — AB (ref 65–99)
Glucose-Capillary: 183 mg/dL — ABNORMAL HIGH (ref 65–99)
Glucose-Capillary: 270 mg/dL — ABNORMAL HIGH (ref 65–99)
Glucose-Capillary: 246 mg/dL — ABNORMAL HIGH (ref 65–99)

## 2015-06-18 LAB — BASIC METABOLIC PANEL
ANION GAP: 15 (ref 5–15)
BUN: 126 mg/dL — ABNORMAL HIGH (ref 6–20)
CHLORIDE: 106 mmol/L (ref 101–111)
CO2: 21 mmol/L — AB (ref 22–32)
Calcium: 8.5 mg/dL — ABNORMAL LOW (ref 8.9–10.3)
Creatinine, Ser: 2.82 mg/dL — ABNORMAL HIGH (ref 0.44–1.00)
GFR calc Af Amer: 17 mL/min — ABNORMAL LOW (ref >60)
GFR calc non Af Amer: 15 mL/min — ABNORMAL LOW (ref >60)
GLUCOSE: 185 mg/dL — AB (ref 65–99)
POTASSIUM: 4.9 mmol/L (ref 3.5–5.1)
Sodium: 142 mmol/L (ref 135–145)

## 2015-06-18 LAB — BLOOD GAS, ARTERIAL
Acid-base deficit: 3.9 mmol/L — ABNORMAL HIGH (ref 0.0–2.0)
Bicarbonate: 20.6 mEq/L (ref 20.0–24.0)
DRAWN BY: 23588
O2 CONTENT: 4.5 L/min
O2 Saturation: 97.2 %
PH ART: 7.362 (ref 7.350–7.450)
Patient temperature: 98.6
TCO2: 21.8 mmol/L (ref 0–100)
pCO2 arterial: 37.2 mmHg (ref 35.0–45.0)
pO2, Arterial: 99.9 mmHg (ref 80.0–100.0)

## 2015-06-18 LAB — IRON AND TIBC
IRON: 17 ug/dL — AB (ref 28–170)
Saturation Ratios: 6 % — ABNORMAL LOW (ref 10.4–31.8)
TIBC: 276 ug/dL (ref 250–450)
UIBC: 259 ug/dL

## 2015-06-18 MED ORDER — PREDNISONE 20 MG PO TABS
20.0000 mg | ORAL_TABLET | Freq: Every day | ORAL | Status: DC
  Administered 2015-06-19: 20 mg via ORAL
  Filled 2015-06-18: qty 1

## 2015-06-18 MED ORDER — INSULIN ASPART 100 UNIT/ML ~~LOC~~ SOLN
3.0000 [IU] | Freq: Three times a day (TID) | SUBCUTANEOUS | Status: DC
Start: 2015-06-18 — End: 2015-06-22
  Administered 2015-06-18 – 2015-06-21 (×8): 3 [IU] via SUBCUTANEOUS

## 2015-06-18 MED ORDER — ALBUTEROL SULFATE (2.5 MG/3ML) 0.083% IN NEBU
3.0000 mL | INHALATION_SOLUTION | Freq: Three times a day (TID) | RESPIRATORY_TRACT | Status: DC
  Administered 2015-06-18 – 2015-06-22 (×12): 3 mL via RESPIRATORY_TRACT
  Filled 2015-06-18 (×13): qty 3

## 2015-06-18 MED ORDER — HYDRALAZINE HCL 50 MG PO TABS
75.0000 mg | ORAL_TABLET | Freq: Three times a day (TID) | ORAL | Status: DC
Start: 2015-06-18 — End: 2015-06-21
  Administered 2015-06-18 – 2015-06-21 (×9): 75 mg via ORAL
  Filled 2015-06-18 (×10): qty 1

## 2015-06-18 MED ORDER — INSULIN ASPART 100 UNIT/ML ~~LOC~~ SOLN
0.0000 [IU] | Freq: Three times a day (TID) | SUBCUTANEOUS | Status: DC
  Administered 2015-06-18 (×2): 5 [IU] via SUBCUTANEOUS
  Administered 2015-06-19 – 2015-06-20 (×6): 3 [IU] via SUBCUTANEOUS
  Administered 2015-06-21: 2 [IU] via SUBCUTANEOUS
  Administered 2015-06-21: 3 [IU] via SUBCUTANEOUS
  Administered 2015-06-21 – 2015-06-22 (×2): 2 [IU] via SUBCUTANEOUS
  Administered 2015-06-22: 3 [IU] via SUBCUTANEOUS

## 2015-06-18 MED ORDER — PREDNISONE 20 MG PO TABS: 40.0000 mg | ORAL_TABLET | Freq: Every day | ORAL | Status: DC

## 2015-06-18 MED ORDER — FUROSEMIDE 10 MG/ML IJ SOLN: 80.0000 mg | Freq: Two times a day (BID) | INTRAMUSCULAR | Status: DC

## 2015-06-18 MED ORDER — ISOSORBIDE MONONITRATE ER 60 MG PO TB24
60.0000 mg | ORAL_TABLET | Freq: Every day | ORAL | Status: DC
  Administered 2015-06-19: 60 mg via ORAL
  Filled 2015-06-18 (×2): qty 1

## 2015-06-18 MED ORDER — FUROSEMIDE 10 MG/ML IJ SOLN
80.0000 mg | Freq: Four times a day (QID) | INTRAMUSCULAR | Status: DC
  Administered 2015-06-18 – 2015-06-19 (×4): 80 mg via INTRAVENOUS
  Filled 2015-06-18 (×4): qty 8

## 2015-06-18 NOTE — Progress Notes (Signed)
Pt admitted to unit with CHF exacerbation, pt a/o, no c/o pain, family at bedside, pt oriented to unit, urine sent, pt on 4L O2 sats 94%, pt now resting comfortably, pt stable

## 2015-06-18 NOTE — Consult Note (Signed)
   [facility] CM Inpatient Consult   06/18/2015  Anna [name] 05-12-1934 [id number] Patient recently re-admitted.   Met with the patient, daughter and son regarding the benefits of [facility] Care Management services.  Patient states she was recently discharged from [facility] in [place] a week ago and returned home with Home Health services.  Family is not sure of the Home Health agency.  They states that the patient also gets personal care services through [facility] 3 hours a day/7 days a week. They endorse Mary-Margaret Hassell Done, NP as her primary care provider.  They state that the patient does not have a scale at home to weigh on.  They state that they provide her transportation as they only live about 10 minutes away from the patient.  Explained that [facility] [place] [facility] is a covered benefit of insurance. Review information for [facility] Care Management and a brochure was provided with contact information.  Explained that [facility] does not interfere with or replace any services arranged by the inpatient care management staff.  Family states that they are awaiting to speak with a kidney doctor and they are currently not sure was the plan is for discharge and would like to review the brochure and information provided. Will follow up next week if the patient remains inpatient.  Encourage family to call as well if there are any further questions.  Please contact: Natividad Brood, RN BSN [facility] Liaison  [phone] business mobile phone Toll free office [phone]

## 2015-06-18 NOTE — Progress Notes (Signed)
Utilization review completed. Robinn Overholt, RN, BSN. 

## 2015-06-18 NOTE — Progress Notes (Signed)
Anna [name] [id number] DOB: [birth year] DOA: 06/17/2015 PCP: Chevis Pretty, FNP  Brief narrative:  81 y/o ? COPD not on home oxygen AKI/Stage iv-v CKD AORD/Anemia renal disease Mixed connective tissue disease follwed by derm Willaim [name] neg] OSA-needs sleep study Htn IDDM with neuropathy HLD COPD Bipolar\ GERD Myoview 04/24/15 EF 71% -Admitted 12/20-12/25 for GS pancreatitis and had ERCP-S/p Lap Chole 05/23/15 Prior colonoscopy 09/2014-5 polyps [adenoma] EGD was neg for source bleed Prior Degen disck disease s/p injections  Recent admisson CAP  Also found to have Worsening creatinine US guided biopsy kidney=IGA GN,cresenctic-sent home on Prednisone 40  Past medical history-As per Problem list Chart reviewed as below-   Consultants:  Nephrology  Cardiology  Procedures:    Antibiotics:     Subjective   Feels better Swelling a little down, total -1.3 liters Wght 109-107 KG No cp No chills no rigors   Objective    Interim History:   Telemetry:    Objective: Filed Vitals:   06/18/15 0931 06/18/15 1032 06/18/15 1217 06/18/15 1420  BP: 143/47 148/44 160/48 133/43  Pulse: 68 70 63 62  Temp: 97.5 F (36.4 C)  98 F (36.7 C) 97.4 F (36.3 C)  TempSrc: Oral  Oral Oral  Resp: 18  18 18   Height:      Weight:      SpO2: 97%  100% 98%    Intake/Output Summary (Last 24 hours) at 06/18/15 1620 Last data filed at 06/18/15 1504  Gross per 24 hour  Intake    590 ml  Output   1950 ml  Net  -1360 ml    Exam:  General: eomi ncat Cardiovascular:  s1 s 2no m/r/g Respiratory:  Clear no rales Abdomen: soft no rebound Skin pitting edema-anasarca Neuro no deficit  Data Reviewed: Basic Metabolic Panel:  Recent Labs Lab 06/17/15 1445 06/18/15 0503  NA 143 142  K 4.8 4.9  CL 109 106  CO2 23 21*  GLUCOSE 114* 185*  BUN 122* 126*  CREATININE 2.84* 2.82*  CALCIUM 8.5* 8.5*   Liver Function Tests:  Recent Labs Lab 06/17/15 1445    AST 23  ALT 17  ALKPHOS 41  BILITOT 0.5  PROT 5.1*  ALBUMIN 2.3*    Recent Labs Lab 06/17/15 1445  LIPASE 50   No results for input(s): AMMONIA in the last 168 hours. CBC:  Recent Labs Lab 06/17/15 1445  WBC 7.7  NEUTROABS 7.3  HGB 8.7*  HCT 28.6*  MCV 91.1  PLT 127*   Cardiac Enzymes:  Recent Labs Lab 06/17/15 1445  TROPONINI 0.06*   BNP: Invalid input(s): POCBNP CBG:  Recent Labs Lab 06/17/15 2104 06/18/15 0604 06/18/15 1120  GLUCAP 97 183* 288*    No results found for this or any previous visit (from the past 240 hour(s)).   Studies:              All Imaging reviewed and is as per above notation   Scheduled Meds: . acetaminophen  975 mg Oral QHS  . amLODipine  10 mg Oral Daily  . arformoterol  15 mcg Nebulization BID  . aspirin EC  81 mg Oral Daily  . atorvastatin  20 mg Oral q1800  . budesonide-formoterol  1 puff Inhalation BID  . carvedilol  25 mg Oral BID WC  . citalopram  20 mg Oral Daily  . cloNIDine  0.2 mg Oral TID  . febuxostat  40 mg Oral Daily  . ferrous sulfate  325 mg Oral  Daily  . furosemide  80 mg Intravenous Q6H  . gabapentin  100 mg Oral BID  . heparin  5,000 Units Subcutaneous 3 times per day  . hydrALAZINE  75 mg Oral 3 times per day  . insulin aspart  0-9 Units Subcutaneous TID WC  . insulin aspart  3 Units Subcutaneous TID WC  . [START ON 06/19/2015] isosorbide mononitrate  60 mg Oral Daily  . omega-3 acid ethyl esters  1 g Oral Daily  . pantoprazole  40 mg Oral Daily  . polyethylene glycol  17 g Oral Daily  . [START ON 06/19/2015] predniSONE  40 mg Oral Q breakfast  . sodium chloride flush  3 mL Intravenous Q12H   Continuous Infusions:    Assessment/Plan:  Acute respiratory failure with hypoxia (HCC)-  Secondary to Acute likely diastolic heart failure complicated by IgA nephropathy and Anasarca  BNP not very specific in obese patients and troponin elevated secondary to renal disease  Diuresis  now 80 IV 2 a  day. Backed down to prednisone from Solu-medrol. Rest as per Nephrology Will need eventual dialysis?   Elevated troponin  No further workup EKG benign and no chest pain   COPD (chronic obstructive pulmonary disease) (HCC) -Clinically her symptoms not consistent with acute exacerbation, continue albuterol inhaler every 4 when necessary, Brovana 15 g twice a day, Symbicort 80/4.5 one puff twice a day  Diabetic neuropathy (HCC)-decreased dose of gabapentin from 300 3 times a day to 100 daily  Depression with anxiety-continue citalopram 20 mg daily  Gout-continue using lower dose  Uloric 40 mg HTn-on multiple agents which we will continue inclusive of hydralazine 50 x 3/day, clonidine 0.2 x 3 day, Coreg 25 twice a day, amlodipine 10 daily, Imdur 60 daily--not sure how compliant she is  Hypoalbuminemia-see above discussion. Patient has low protein and low albumin likely secondary to IgA nephropathy.  CKD (chronic kidney disease), stage IV (HCC)See above discussion-  Anemia, unspecifiedLikely anemia of chronic disease. Patient has prior history of polyps   Anna Griffes, MD  [facility] Pager [phone] 06/18/2015, 4:20 PM    LOS: 1 day

## 2015-06-18 NOTE — Progress Notes (Signed)
PATIENT ID: [id number] with chronic diastolic heart failure, CKD III-IV newly diagnosed with IgA glomerulonephritis, hypertension, COPD, DM type II, OSA and mild AS/MR here with volume overload and shortness of breath.  INTERVAL HISTORY: -1.4L yesterday.  Seen by Nephrology, who agrees with diuresis and is considering steroids and immunosuppression.  SUBJECTIVE:  Anna [name] continues to feel very short of breath and weak.  She denies pain.  She endorses orthopnea and has not noticed any change since admission.     PHYSICAL EXAM Filed Vitals:   06/18/15 0858 06/18/15 0931 06/18/15 1032 06/18/15 1217  BP:  143/47 148/44 160/48  Pulse:  68 70 63  Temp:  97.5 F (36.4 C)  98 F (36.7 C)  TempSrc:  Oral  Oral  Resp:  18  18  Height:      Weight:      SpO2: 96% 97%  100%   Gen: Chronically ill-appearing. Morbidly obese. No acute distress. Neck: No JVD at 45 degrees COR: Distant heart sounds. Regular rate and rhythm.  Lungs: Bibasilar crackles. No rhonchi or wheezes.  Abd: Soft, ND, NT. +BS Ext: 3+ pitting edema  LABS: Lab Results  Component Value Date   TROPONINI 0.06* 06/17/2015   Results for orders placed or performed during the hospital encounter of 06/17/15 (from the past 24 hour(s))  Comprehensive metabolic panel     Status: Abnormal   Collection Time: 06/17/15  2:45 PM  Result Value Ref Range   Sodium 143 135 - 145 mmol/L   Potassium 4.8 3.5 - 5.1 mmol/L   Chloride 109 101 - 111 mmol/L   CO2 23 22 - 32 mmol/L   Glucose, Bld 114 (H) 65 - 99 mg/dL   BUN 122 (H) 6 - 20 mg/dL   Creatinine, Ser 2.84 (H) 0.44 - 1.00 mg/dL   Calcium 8.5 (L) 8.9 - 10.3 mg/dL   Total Protein 5.1 (L) 6.5 - 8.1 g/dL   Albumin 2.3 (L) 3.5 - 5.0 g/dL   AST 23 15 - 41 U/L   ALT 17 14 - 54 U/L   Alkaline Phosphatase 41 38 - 126 U/L   Total Bilirubin 0.5 0.3 - 1.2 mg/dL   GFR calc non Af Amer 15 (L) >60 mL/min   GFR calc Af Amer 17 (L) >60 mL/min   Anion gap 11 5 - 15  Lipase, blood      Status: None   Collection Time: 06/17/15  2:45 PM  Result Value Ref Range   Lipase 50 11 - 51 U/L  Brain natriuretic peptide     Status: Abnormal   Collection Time: 06/17/15  2:45 PM  Result Value Ref Range   B Natriuretic Peptide 660.3 (H) 0.0 - 100.0 pg/mL  Troponin I     Status: Abnormal   Collection Time: 06/17/15  2:45 PM  Result Value Ref Range   Troponin I 0.06 (H) <0.031 ng/mL  CBC with Differential     Status: Abnormal   Collection Time: 06/17/15  2:45 PM  Result Value Ref Range   WBC 7.7 4.0 - 10.5 K/uL   RBC 3.14 (L) 3.87 - 5.11 MIL/uL   Hemoglobin 8.7 (L) 12.0 - 15.0 g/dL   HCT 28.6 (L) 36.0 - 46.0 %   MCV 91.1 78.0 - 100.0 fL   MCH 27.7 26.0 - 34.0 pg   MCHC 30.4 30.0 - 36.0 g/dL   RDW 17.4 (H) 11.5 - 15.5 %   Platelets 127 (L) 150 - 400 K/uL  Neutrophils Relative % 95 %   Neutro Abs 7.3 1.7 - 7.7 K/uL   Lymphocytes Relative 3 %   Lymphs Abs 0.2 (L) 0.7 - 4.0 K/uL   Monocytes Relative 2 %   Monocytes Absolute 0.2 0.1 - 1.0 K/uL   Eosinophils Relative 0 %   Eosinophils Absolute 0.0 0.0 - 0.7 K/uL   Basophils Relative 0 %   Basophils Absolute 0.0 0.0 - 0.1 K/uL  I-Stat CG4 Lactic Acid, ED     Status: Abnormal   Collection Time: 06/17/15  2:53 PM  Result Value Ref Range   Lactic Acid, Venous 0.45 (L) 0.5 - 2.0 mmol/L  Glucose, capillary     Status: None   Collection Time: 06/17/15  9:04 PM  Result Value Ref Range   Glucose-Capillary 97 65 - 99 mg/dL   Comment 1 Notify RN    Comment 2 Document in Chart   Basic metabolic panel     Status: Abnormal   Collection Time: 06/18/15  5:03 AM  Result Value Ref Range   Sodium 142 135 - 145 mmol/L   Potassium 4.9 3.5 - 5.1 mmol/L   Chloride 106 101 - 111 mmol/L   CO2 21 (L) 22 - 32 mmol/L   Glucose, Bld 185 (H) 65 - 99 mg/dL   BUN 126 (H) 6 - 20 mg/dL   Creatinine, Ser 2.82 (H) 0.44 - 1.00 mg/dL   Calcium 8.5 (L) 8.9 - 10.3 mg/dL   GFR calc non Af Amer 15 (L) >60 mL/min   GFR calc Af Amer 17 (L) >60 mL/min    Anion gap 15 5 - 15  Iron and TIBC     Status: Abnormal   Collection Time: 06/18/15  5:03 AM  Result Value Ref Range   Iron 17 (L) 28 - 170 ug/dL   TIBC 276 250 - 450 ug/dL   Saturation Ratios 6 (L) 10.4 - 31.8 %   UIBC 259 ug/dL  Glucose, capillary     Status: Abnormal   Collection Time: 06/18/15  6:04 AM  Result Value Ref Range   Glucose-Capillary 183 (H) 65 - 99 mg/dL  Blood gas, arterial     Status: Abnormal   Collection Time: 06/18/15  9:45 AM  Result Value Ref Range   O2 Content 4.5 L/min   Delivery systems NASAL CANNULA    pH, Arterial 7.362 7.350 - 7.450   pCO2 arterial 37.2 35.0 - 45.0 mmHg   pO2, Arterial 99.9 80.0 - 100.0 mmHg   Bicarbonate 20.6 20.0 - 24.0 mEq/L   TCO2 21.8 0 - 100 mmol/L   Acid-base deficit 3.9 (H) 0.0 - 2.0 mmol/L   O2 Saturation 97.2 %   Patient temperature 98.6    Collection site RIGHT RADIAL    Drawn by [phone]    Sample type ARTERIAL DRAW    Allens test (pass/fail) PASS PASS  Glucose, capillary     Status: Abnormal   Collection Time: 06/18/15 11:20 AM  Result Value Ref Range   Glucose-Capillary 288 (H) 65 - 99 mg/dL   Comment 1 Notify RN     Intake/Output Summary (Last 24 hours) at 06/18/15 1342 Last data filed at 06/18/15 1032  Gross per 24 hour  Intake    415 ml  Output   1800 ml  Net  -1385 ml    Telemetry: No events  ASSESSMENT AND PLAN:  Principal Problem:   Anasarca associated with disorder of kidney Active Problems:   COPD (chronic obstructive pulmonary disease) (  [place])   Diabetic neuropathy ([place])   Depression with anxiety   Gout   Volume overload   Hypoalbuminemia   CKD (chronic kidney disease), stage IV (HCC)   Acute respiratory failure with hypoxia (HCC)   Anemia, unspecified   Elevated troponin   Acute on chronic diastolic CHF (congestive heart failure) (HCC)   Acute respiratory failure (HCC)   # Acute on chronic diastolic heart failure: Grade 2 diastolic dysfunction on recent echo.  It seems unlikely that this  is her primary problem, thought it certainly is not helping.  Blood pressure is poorly-controlled.  No ARB/ACE-I 2/2 CKD.   - Increase lasix to 80 mg IV bid - Continue amlodipine 10 mg daily, carvedilol 25 mg q12h, clonidine 0.2 mg tid - Increase hydralazine to 75 mg tid and Imdur 60 mg daily  # Demand ischemia: troponin very mildly elevated.  Likely due to her volume overload and acute on chronic heart failure.  She denies chest pain and there are no ischemic changes on EKG.  Will repeat troponin x1 to ensure that there has not been a significant increase.  # IgA glomerulonephritis: Continue diuresis per Dr. Justin Mend of Nephrology.  He is considering steroids and immunosuppressives, though it is unclear how helpful that will be at this point.   Time spent: 35 minutes-Greater than 50% of this time was spent in counseling, explanation of diagnosis, planning of further management, and coordination of care.    Anna Baumgardner C. Oval Linsey, MD, [facility] 06/18/2015 1:42 PM

## 2015-06-18 NOTE — Progress Notes (Signed)
Assessment/Plan: 1. CKD stage 4 secondary to IgA nephropathy Creatinine 2-3 's Followed by Dr Lorrene Reid underwent renal biopsy in February. Patient was admitted with volume overload.  Will reduce steroids in this high risk pt. Will increase diuretics. 2. HTN  3. Diabetes stable 4. Anemia with low iron, give Feraheme  Subjective: Interval History: Nothing new Objective: Vital signs in last 24 hours: Temp:  [97.4 F (36.3 C)-98.2 F (36.8 C)] 97.4 F (36.3 C) (02/17 1420) Pulse Rate:  [62-79] 62 (02/17 1420) Resp:  [17-24] 18 (02/17 1420) BP: (114-176)/(43-99) 133/43 mmHg (02/17 1420) SpO2:  [93 %-100 %] 98 % (02/17 1420) Weight:  [107 kg (235 lb 14.3 oz)-109.045 kg (240 lb 6.4 oz)] 107.593 kg (237 lb 3.2 oz) (02/17 0610) Weight change:   Intake/Output from previous day: 02/16 0701 - 02/17 0700 In: -  Out: 1450 [Urine:1450] Intake/Output this shift: Total I/O In: 415 [P.O.:415] Out: 500 [Urine:500]  General appearance: alert and cooperative Resp: diminished breath sounds bibasilar Chest wall: no tenderness Extremities: edema 2-3+  Lab Results:  Recent Labs  06/17/15 1445  WBC 7.7  HGB 8.7*  HCT 28.6*  PLT 127*   BMET:  Recent Labs  06/17/15 1445 06/18/15 0503  NA 143 142  K 4.8 4.9  CL 109 106  CO2 23 21*  GLUCOSE 114* 185*  BUN 122* 126*  CREATININE 2.84* 2.82*  CALCIUM 8.5* 8.5*   No results for input(s): PTH in the last 72 hours. Iron Studies:  Recent Labs  06/18/15 0503  IRON 17*  TIBC 276   Studies/Results: Dg Chest 2 View  06/17/2015  CLINICAL DATA:  Shortness of breath for several weeks EXAM: CHEST  2 VIEW COMPARISON:  05/31/2015 FINDINGS: Cardiac shadow is enlarged. Aortic calcifications are again noted. Lungs are well aerated but demonstrate evidence of small left effusion stable from the prior exam. Diffuse bilateral perihilar changes are now seen consistent with pulmonary edema. No pneumothorax is noted. No acute bony abnormality is seen.  IMPRESSION: Diffuse increasing perihilar densities consistent with pulmonary edema. Electronically Signed   By: Inez Catalina M.D.   On: 06/17/2015 14:36    Scheduled: . acetaminophen  975 mg Oral QHS  . amLODipine  10 mg Oral Daily  . arformoterol  15 mcg Nebulization BID  . aspirin EC  81 mg Oral Daily  . atorvastatin  20 mg Oral q1800  . budesonide-formoterol  1 puff Inhalation BID  . carvedilol  25 mg Oral BID WC  . citalopram  20 mg Oral Daily  . cloNIDine  0.2 mg Oral TID  . febuxostat  40 mg Oral Daily  . ferrous sulfate  325 mg Oral Daily  . furosemide  80 mg Intravenous Q12H  . gabapentin  100 mg Oral BID  . heparin  5,000 Units Subcutaneous 3 times per day  . hydrALAZINE  75 mg Oral 3 times per day  . insulin aspart  0-9 Units Subcutaneous TID WC  . insulin aspart  3 Units Subcutaneous TID WC  . [START ON 06/19/2015] isosorbide mononitrate  60 mg Oral Daily  . methylPREDNISolone (SOLU-MEDROL) injection  40 mg Intravenous Q12H  . omega-3 acid ethyl esters  1 g Oral Daily  . pantoprazole  40 mg Oral Daily  . polyethylene glycol  17 g Oral Daily  . sodium chloride flush  3 mL Intravenous Q12H    LOS: 1 day   Sabrie Moritz C 06/18/2015,2:44 PM

## 2015-06-18 NOTE — Progress Notes (Signed)
Inpatient Diabetes Program Recommendations  AACE/ADA: New Consensus Statement on Inpatient Glycemic Control (2015)  Target Ranges:  Prepandial:   less than 140 mg/dL      Peak postprandial:   less than 180 mg/dL (1-2 hours)      Critically ill patients:  140 - 180 mg/dL  Results for [name], [name] (MRN [id number]) as of 06/18/2015 12:06  Ref. Range 06/17/2015 21:04 06/18/2015 06:04 06/18/2015 11:20  Glucose-Capillary Latest Ref Range: 65-99 mg/dL 97 183 (H) 288 (H)   Review of Glycemic Control  Diabetes history: DM2 Outpatient Diabetes medications: 75/25 25 units BID Current orders for Inpatient glycemic control: Novolog 0-9 units TID with meals, Novolog 3 units TID with meals  Inpatient Diabetes Program Recommendations:  Insulin - Basal: According to home medication list, patient is taking 75/25 25 units BID. Please consider ordering at least half of outpatient 75/25 dose. Recommend starting with at least 70/30 12 units BID and adjust as needed to improve glycemic control.Please note, if 70/30 is ordered as recommended, ordered meal coverage of Novolog 3 units TID with meals will need to be discontinued as 70/30 provides basal and meal coverage.  Thanks, Barnie Alderman, RN, MSN, CDE Diabetes Coordinator Inpatient Diabetes Program [phone] (Team Pager from [place] to [place]) [phone] ([facility] office) [phone] [facility] office) [phone] [facility] office)

## 2015-06-19 LAB — BASIC METABOLIC PANEL
Anion gap: 13 (ref 5–15)
BUN: 144 mg/dL — ABNORMAL HIGH (ref 6–20)
CALCIUM: 8.3 mg/dL — AB (ref 8.9–10.3)
CO2: 23 mmol/L (ref 22–32)
CREATININE: 3.37 mg/dL — AB (ref 0.44–1.00)
Chloride: 104 mmol/L (ref 101–111)
GFR, EST AFRICAN AMERICAN: 14 mL/min — AB (ref >60)
GFR, EST NON AFRICAN AMERICAN: 12 mL/min — AB (ref >60)
Glucose, Bld: 277 mg/dL — ABNORMAL HIGH (ref 65–99)
Potassium: 4.9 mmol/L (ref 3.5–5.1)
SODIUM: 140 mmol/L (ref 135–145)

## 2015-06-19 LAB — URINALYSIS, ROUTINE W REFLEX MICROSCOPIC
BILIRUBIN URINE: NEGATIVE
Glucose, UA: NEGATIVE mg/dL
Ketones, ur: NEGATIVE mg/dL
Nitrite: POSITIVE — AB
Protein, ur: 30 mg/dL — AB
SPECIFIC GRAVITY, URINE: 1.013 (ref 1.005–1.030)
pH: 5 (ref 5.0–8.0)

## 2015-06-19 LAB — GLUCOSE, CAPILLARY
GLUCOSE-CAPILLARY: 248 mg/dL — AB (ref 65–99)
GLUCOSE-CAPILLARY: 214 mg/dL — AB (ref 65–99)
GLUCOSE-CAPILLARY: 235 mg/dL — AB (ref 65–99)
Glucose-Capillary: 211 mg/dL — ABNORMAL HIGH (ref 65–99)

## 2015-06-19 LAB — CBC
HCT: 27 % — ABNORMAL LOW (ref 36.0–46.0)
Hemoglobin: 8.3 g/dL — ABNORMAL LOW (ref 12.0–15.0)
MCH: 27.7 pg (ref 26.0–34.0)
MCHC: 30.7 g/dL (ref 30.0–36.0)
MCV: 90 fL (ref 78.0–100.0)
PLATELETS: ADEQUATE 10*3/uL (ref 150–400)
RBC: 3 MIL/uL — AB (ref 3.87–5.11)
RDW: 17.1 % — ABNORMAL HIGH (ref 11.5–15.5)
WBC: 7.4 10*3/uL (ref 4.0–10.5)

## 2015-06-19 LAB — URINE MICROSCOPIC-ADD ON

## 2015-06-19 MED ORDER — FUROSEMIDE 10 MG/ML IJ SOLN
100.0000 mg | Freq: Four times a day (QID) | INTRAVENOUS | Status: DC
  Administered 2015-06-19 – 2015-06-20 (×4): 100 mg via INTRAVENOUS
  Filled 2015-06-19 (×6): qty 10

## 2015-06-19 MED ORDER — CARVEDILOL 12.5 MG PO TABS
12.5000 mg | ORAL_TABLET | Freq: Two times a day (BID) | ORAL | Status: DC
  Administered 2015-06-19: 12.5 mg via ORAL
  Filled 2015-06-19: qty 1

## 2015-06-19 MED ORDER — INSULIN ASPART 100 UNIT/ML ~~LOC~~ SOLN: 3.0000 [IU] | Freq: Three times a day (TID) | SUBCUTANEOUS | Status: DC

## 2015-06-19 MED ORDER — INSULIN ASPART 100 UNIT/ML ~~LOC~~ SOLN: 0.0000 [IU] | Freq: Three times a day (TID) | SUBCUTANEOUS | Status: DC

## 2015-06-19 MED ORDER — SODIUM CHLORIDE 0.9 % IV SOLN
510.0000 mg | Freq: Once | INTRAVENOUS | Status: AC
  Administered 2015-06-19: 510 mg via INTRAVENOUS
  Filled 2015-06-19: qty 17

## 2015-06-19 NOTE — Progress Notes (Signed)
Pt had decreased urine output throughout the night, pt getting lasix 80mg  IV every 6 hours. Walden Field NP made aware.

## 2015-06-19 NOTE — Progress Notes (Signed)
Assessment/Plan: 1. CKD stage 4 secondary to IgA nephropathy Creatinine 2-3 's Followed by Dr Lorrene Reid underwent renal biopsy in February. Patient was admitted with volume overload. Not responding well and with increased azotemia, but will increase diuretics.  If does not respond will need HD. Will also check Hgb, given azotemia(also on steroids) 2. HTN  3. Diabetes stable 4. Anemia with low iron, given iron  Subjective: Interval History: mod response to diuretics  Objective: Vital signs in last 24 hours: Temp:  [97.4 F (36.3 [name])-98.3 F (36.8 [name])] 97.8 F (36.6 [name]) (02/18 1044) Pulse Rate:  [56-63] 63 (02/18 1044) Resp:  [18] 18 (02/18 1044) BP: (111-146)/(43-50) 111/43 mmHg (02/18 1044) SpO2:  [84 %-98 %] 95 % (02/18 1044) Weight:  [107.276 kg (236 lb 8 oz)] 107.276 kg (236 lb 8 oz) (02/18 0430) Weight change: 2.948 kg (6 lb 8 oz)  Intake/Output from previous day: 02/17 0701 - 02/18 0700 In: 1250 [P.O.:1250] Out: 976 [Urine:975; Stool:1] Intake/Output this shift: Total I/O In: -  Out: 100 [Urine:100]  General appearance: alert and cooperative Resp: diminished breath sounds bibasilar Chest wall: no tenderness Cardio: regular rate and rhythm, S1, S2 normal, no murmur, click, rub or gallop Extremities: edema 2-3+  Lab Results:  Recent Labs  06/17/15 1445  WBC 7.7  HGB 8.7*  HCT 28.6*  PLT 127*   BMET:  Recent Labs  06/18/15 0503 06/19/15 0546  NA 142 140  K 4.9 4.9  CL 106 104  CO2 21* 23  GLUCOSE 185* 277*  BUN 126* 144*  CREATININE 2.82* 3.37*  CALCIUM 8.5* 8.3*   No results for input(s): PTH in the last 72 hours. Iron Studies:  Recent Labs  06/18/15 0503  IRON 17*  TIBC 276   Studies/Results: Dg Chest 2 View  06/17/2015  CLINICAL DATA:  Shortness of breath for several weeks EXAM: CHEST  2 VIEW COMPARISON:  05/31/2015 FINDINGS: Cardiac shadow is enlarged. Aortic calcifications are again noted. Lungs are well aerated but demonstrate evidence of  small left effusion stable from the prior exam. Diffuse bilateral perihilar changes are now seen consistent with pulmonary edema. No pneumothorax is noted. No acute bony abnormality is seen. IMPRESSION: Diffuse increasing perihilar densities consistent with pulmonary edema. Electronically Signed   By: Anna [name] M.D.   On: 06/17/2015 14:36    Scheduled: . acetaminophen  975 mg Oral QHS  . albuterol  3 mL Inhalation TID  . amLODipine  10 mg Oral Daily  . arformoterol  15 mcg Nebulization BID  . aspirin EC  81 mg Oral Daily  . atorvastatin  20 mg Oral q1800  . budesonide-formoterol  1 puff Inhalation BID  . carvedilol  25 mg Oral BID WC  . citalopram  20 mg Oral Daily  . cloNIDine  0.2 mg Oral TID  . febuxostat  40 mg Oral Daily  . ferrous sulfate  325 mg Oral Daily  . ferumoxytol  510 mg Intravenous Once  . furosemide  100 mg Intravenous Q6H  . gabapentin  100 mg Oral BID  . heparin  5,000 Units Subcutaneous 3 times per day  . hydrALAZINE  75 mg Oral 3 times per day  . insulin aspart  0-9 Units Subcutaneous TID WC  . insulin aspart  3 Units Subcutaneous TID WC  . isosorbide mononitrate  60 mg Oral Daily  . omega-3 acid ethyl esters  1 g Oral Daily  . pantoprazole  40 mg Oral Daily  . polyethylene glycol  17  g Oral Daily  . predniSONE  20 mg Oral Q breakfast  . sodium chloride flush  3 mL Intravenous Q12H     LOS: 2 days   Anna [name] 06/19/2015,12:29 PM

## 2015-06-19 NOTE — Progress Notes (Signed)
Anna [name] [id number] DOB: June 07, 1934 DOA: 06/17/2015 PCP: Chevis Pretty, FNP  Brief narrative:  81 y/o ? COPD not on home oxygen AKI/Stage iv-v CKD AORD/Anemia renal disease Mixed connective tissue disease follwed by derm [name] neg] OSA-needs sleep study Htn IDDM with neuropathy HLD COPD Bipolar\ GERD Myoview 04/24/15 EF 71% -Admitted 12/20-12/25 for GS pancreatitis and had ERCP-S/p Lap Chole 05/23/15 Prior colonoscopy 09/2014-5 polyps [adenoma] EGD was neg for source bleed Prior Degen disck disease s/p injections  Recent admisson CAP  Also found to have Worsening creatinine US guided biopsy kidney=IGA GN,cresenctic-sent home on Prednisone 40  Past medical history-As per Problem list Chart reviewed as below-   Consultants:  Nephrology  Cardiology  Procedures:    Antibiotics:     Subjective   Decreased UoP overnight No n/v    Objective    Interim History:   Telemetry:    Objective: Filed Vitals:   06/18/15 2113 06/19/15 0430 06/19/15 0850 06/19/15 1044  BP:  146/49  111/43  Pulse:  60  63  Temp:  97.9 F (36.6 C)  97.8 F (36.6 C)  TempSrc:  Oral    Resp:  18  18  Height:      Weight:  107.276 kg (236 lb 8 oz)    SpO2: 94% 94% 96% 95%    Intake/Output Summary (Last 24 hours) at 06/19/15 1101 Last data filed at 06/19/15 1000  Gross per 24 hour  Intake    835 ml  Output    726 ml  Net    109 ml    Exam:  General: eomi ncat Cardiovascular:  s1 s 2no m/r/g Respiratory:  Clear no rales Abdomen: soft no rebound Skin pitting edema-anasarca Neuro no deficit  Data Reviewed: Basic Metabolic Panel:  Recent Labs Lab 06/17/15 1445 06/18/15 0503 06/19/15 0546  NA 143 142 140  K 4.8 4.9 4.9  CL 109 106 104  CO2 23 21* 23  GLUCOSE 114* 185* 277*  BUN 122* 126* 144*  CREATININE 2.84* 2.82* 3.37*  CALCIUM 8.5* 8.5* 8.3*   Liver Function Tests:  Recent Labs Lab 06/17/15 1445  AST 23  ALT 17  ALKPHOS 41   BILITOT 0.5  PROT 5.1*  ALBUMIN 2.3*    Recent Labs Lab 06/17/15 1445  LIPASE 50   No results for input(s): AMMONIA in the last 168 hours. CBC:  Recent Labs Lab 06/17/15 1445  WBC 7.7  NEUTROABS 7.3  HGB 8.7*  HCT 28.6*  MCV 91.1  PLT 127*   Cardiac Enzymes:  Recent Labs Lab 06/17/15 1445  TROPONINI 0.06*   BNP: Invalid input(s): POCBNP CBG:  Recent Labs Lab 06/18/15 0604 06/18/15 1120 06/18/15 1657 06/18/15 2129 06/19/15 0632  GLUCAP 183* 288* 270* 246* 248*    No results found for this or any previous visit (from the past 240 hour(s)).   Studies:              All Imaging reviewed and is as per above notation   Scheduled Meds: . acetaminophen  975 mg Oral QHS  . albuterol  3 mL Inhalation TID  . amLODipine  10 mg Oral Daily  . arformoterol  15 mcg Nebulization BID  . aspirin EC  81 mg Oral Daily  . atorvastatin  20 mg Oral q1800  . budesonide-formoterol  1 puff Inhalation BID  . carvedilol  25 mg Oral BID WC  . citalopram  20 mg Oral Daily  . cloNIDine  0.2 mg Oral TID  .  febuxostat  40 mg Oral Daily  . ferrous sulfate  325 mg Oral Daily  . furosemide  80 mg Intravenous Q6H  . gabapentin  100 mg Oral BID  . heparin  5,000 Units Subcutaneous 3 times per day  . hydrALAZINE  75 mg Oral 3 times per day  . insulin aspart  0-9 Units Subcutaneous TID WC  . insulin aspart  3 Units Subcutaneous TID WC  . isosorbide mononitrate  60 mg Oral Daily  . omega-3 acid ethyl esters  1 g Oral Daily  . pantoprazole  40 mg Oral Daily  . polyethylene glycol  17 g Oral Daily  . predniSONE  20 mg Oral Q breakfast  . sodium chloride flush  3 mL Intravenous Q12H   Continuous Infusions:    Assessment/Plan:  Acute respiratory failure with hypoxia (HCC)-  Secondary to Acute likely diastolic heart failure complicated by IgA nephropathy and Anasarca  BNP not very specific in obese patients and troponin elevated secondary to renal disease  Initially diuresing  well on  Lasix  80 IV 2 a day-->oliguric 2/18--Increased to lasix 100 q6 Backed down to prednisone from Solu-medrol. Rest as per Nephrology Will need eventual dialysis?  Elevated troponin  No further workup EKG benign and no chest pain   COPD (chronic obstructive pulmonary disease) (HCC) -Clinically her symptoms not consistent with acute exacerbation, continue albuterol inhaler every 4 when necessary, Brovana 15 g twice a day, Symbicort 80/4.5 one puff twice a day  Diabetic neuropathy (HCC)-decreased dose of gabapentin from 300 3 times a day to 100 daily  Depression with anxiety-continue citalopram 20 mg daily  Gout-continue using lower dose  Uloric 40 mg HTn-on multiple agents which we will continue inclusive of hydralazine 50 x 3/day, clonidine 0.2 x 3 day, Coreg 25 twice a day-->12.5 bid as component CHF-->to help diurese more, amlodipine 10 daily, Imdur 60 daily--not sure how compliant she is  Hypoalbuminemia-see above discussion. Patient has low protein and low albumin likely secondary to IgA nephropathy.  CKD (chronic kidney disease), stage IV (HCC)See above discussion-  Anemia, unspecifiedLikely anemia of chronic disease. Patient has prior history of polyps   20 min discussion with family, > 50% f-2-f time  [name], MD  [facility] Pager [phone] 06/19/2015, 11:01 AM    LOS: 2 days

## 2015-06-19 NOTE — Progress Notes (Signed)
PATIENT ID: [id number] with chronic diastolic heart failure, CKD III-IV newly diagnosed with IgA glomerulonephritis, hypertension, COPD, DM type II, OSA and mild AS/MR here with volume overload and shortness of breath.   SUBJECTIVE:  [name] continues to feel very short of breath and weak.  She denies pain.  She endorses orthopnea and has not noticed any change since admission.     PHYSICAL EXAM Filed Vitals:   06/18/15 2026 06/18/15 2113 06/19/15 0430 06/19/15 0850  BP: 135/50  146/49   Pulse: 56  60   Temp: 98.3 F (36.8 C)  97.9 F (36.6 C)   TempSrc: Oral  Oral   Resp: 18  18   Height:      Weight:   236 lb 8 oz (107.276 kg)   SpO2: 96% 94% 94% 96%   Gen: Chronically ill-appearing. Morbidly obese. No acute distress. Neck: No JVD at 45 degrees COR: Distant heart sounds. Regular rate and rhythm.  Lungs: Bibasilar crackles. No rhonchi or wheezes.  Abd: Soft, ND, NT. +BS Ext: 3+ pitting edema  LABS: Lab Results  Component Value Date   TROPONINI 0.06* 06/17/2015   Results for orders placed or performed during the hospital encounter of 06/17/15 (from the past 24 hour(s))  Glucose, capillary     Status: Abnormal   Collection Time: 06/18/15 11:20 AM  Result Value Ref Range   Glucose-Capillary 288 (H) 65 - 99 mg/dL   Comment 1 Notify RN   Glucose, capillary     Status: Abnormal   Collection Time: 06/18/15  4:57 PM  Result Value Ref Range   Glucose-Capillary 270 (H) 65 - 99 mg/dL   Comment 1 Notify RN    Comment 2 Document in Chart   Glucose, capillary     Status: Abnormal   Collection Time: 06/18/15  9:29 PM  Result Value Ref Range   Glucose-Capillary 246 (H) 65 - 99 mg/dL   Comment 1 Notify RN    Comment 2 Document in Chart   Basic metabolic panel     Status: Abnormal   Collection Time: 06/19/15  5:46 AM  Result Value Ref Range   Sodium 140 135 - 145 mmol/L   Potassium 4.9 3.5 - 5.1 mmol/L   Chloride 104 101 - 111 mmol/L   CO2 23 22 - 32 mmol/L   Glucose, Bld 277 (H) 65 - 99 mg/dL   BUN 144 (H) 6 - 20 mg/dL   Creatinine, Ser 3.37 (H) 0.44 - 1.00 mg/dL   Calcium 8.3 (L) 8.9 - 10.3 mg/dL   GFR calc non Af Amer 12 (L) >60 mL/min   GFR calc Af Amer 14 (L) >60 mL/min   Anion gap 13 5 - 15  Glucose, capillary     Status: Abnormal   Collection Time: 06/19/15  6:32 AM  Result Value Ref Range   Glucose-Capillary 248 (H) 65 - 99 mg/dL   Comment 1 Notify RN    Comment 2 Document in Chart     Intake/Output Summary (Last 24 hours) at 06/19/15 1029 Last data filed at 06/19/15 1000  Gross per 24 hour  Intake   1010 ml  Output   1076 ml  Net    -66 ml    Telemetry: No events  ASSESSMENT AND PLAN:  Principal Problem:   Anasarca associated with disorder of kidney Active Problems:   COPD (chronic obstructive pulmonary disease) (HCC)   Diabetic neuropathy (HCC)   Depression with anxiety   Gout   Volume  overload   Hypoalbuminemia   CKD (chronic kidney disease), stage IV (HCC)   Acute respiratory failure with hypoxia (HCC)   Anemia, unspecified   Elevated troponin   Acute on chronic diastolic CHF (congestive heart failure) (HCC)   Acute respiratory failure (HCC)   # Acute on chronic diastolic heart failure:  Has normal LV systolic function.   Grade 2 diastolic dysfunction on recent echo.  It seems unlikely that this is her primary problem, thought it certainly is not helping.  Blood pressure is better  - Continue amlodipine 10 mg daily, carvedilol 25 mg q12h, clonidine 0.2 mg tid - Increase hydralazine to 75 mg tid and Imdur 60 mg daily  # Demand ischemia: troponin very mildly elevated but in a flat pattern.   This is not c/w ACS.   Likely due to her volume overload and renal insufficiency   # IgA glomerulonephritis: Continue diuresis per Dr. Justin Mend of Nephrology.  He is considering steroids and immunosuppressives, though it is unclear how helpful that will be at this point.   Time spent: 44minutes-Greater than 50% of this time was  spent in counseling, talking  explanation of diagnosis, planning of further management, and coordination of care    Bridger Pizzi, Wonda Cheng, MD  06/19/2015 10:47 AM    [place] [address],  [place], [place]  [address] Pager [phone] Phone: [phone]; Fax: [phone]

## 2015-06-20 DIAGNOSIS — R7989 Other specified abnormal findings of blood chemistry: Secondary | ICD-10-CM

## 2015-06-20 LAB — CBC WITH DIFFERENTIAL/PLATELET
BASOS ABS: 0 10*3/uL (ref 0.0–0.1)
Basophils Relative: 0 %
EOS ABS: 0 10*3/uL (ref 0.0–0.7)
EOS PCT: 1 %
HCT: 24.9 % — ABNORMAL LOW (ref 36.0–46.0)
Hemoglobin: 7.7 g/dL — ABNORMAL LOW (ref 12.0–15.0)
Lymphocytes Relative: 7 %
Lymphs Abs: 0.5 10*3/uL — ABNORMAL LOW (ref 0.7–4.0)
MCH: 28.3 pg (ref 26.0–34.0)
MCHC: 30.9 g/dL (ref 30.0–36.0)
MCV: 91.5 fL (ref 78.0–100.0)
Monocytes Absolute: 0 10*3/uL — ABNORMAL LOW (ref 0.1–1.0)
Monocytes Relative: 1 %
Neutro Abs: 6.1 10*3/uL (ref 1.7–7.7)
Neutrophils Relative %: 92 %
PLATELETS: 108 10*3/uL — AB (ref 150–400)
RBC: 2.72 MIL/uL — AB (ref 3.87–5.11)
RDW: 16.9 % — AB (ref 11.5–15.5)
WBC: 6.6 10*3/uL (ref 4.0–10.5)

## 2015-06-20 LAB — BASIC METABOLIC PANEL
Anion gap: 14 (ref 5–15)
BUN: 153 mg/dL — AB (ref 6–20)
CO2: 22 mmol/L (ref 22–32)
CREATININE: 3.59 mg/dL — AB (ref 0.44–1.00)
Calcium: 8.1 mg/dL — ABNORMAL LOW (ref 8.9–10.3)
Chloride: 103 mmol/L (ref 101–111)
GFR calc Af Amer: 13 mL/min — ABNORMAL LOW (ref >60)
GFR, EST NON AFRICAN AMERICAN: 11 mL/min — AB (ref >60)
Glucose, Bld: 223 mg/dL — ABNORMAL HIGH (ref 65–99)
Potassium: 5 mmol/L (ref 3.5–5.1)
SODIUM: 139 mmol/L (ref 135–145)

## 2015-06-20 LAB — GLUCOSE, CAPILLARY
GLUCOSE-CAPILLARY: 214 mg/dL — AB (ref 65–99)
GLUCOSE-CAPILLARY: 205 mg/dL — AB (ref 65–99)
GLUCOSE-CAPILLARY: 226 mg/dL — AB (ref 65–99)
GLUCOSE-CAPILLARY: 153 mg/dL — AB (ref 65–99)

## 2015-06-20 MED ORDER — HYDROCORTISONE 2.5 % RE CREA
TOPICAL_CREAM | Freq: Three times a day (TID) | RECTAL | Status: DC | PRN
  Administered 2015-06-20 – 2015-06-22 (×2): via RECTAL
  Filled 2015-06-20 (×2): qty 28.35

## 2015-06-20 MED ORDER — CLONIDINE HCL 0.1 MG PO TABS
0.1000 mg | ORAL_TABLET | Freq: Three times a day (TID) | ORAL | Status: DC
  Administered 2015-06-20 – 2015-06-22 (×7): 0.1 mg via ORAL
  Filled 2015-06-20 (×7): qty 1

## 2015-06-20 MED ORDER — FUROSEMIDE 10 MG/ML IJ SOLN
160.0000 mg | Freq: Four times a day (QID) | INTRAVENOUS | Status: DC
  Administered 2015-06-20 – 2015-06-21 (×4): 160 mg via INTRAVENOUS
  Filled 2015-06-20 (×7): qty 16

## 2015-06-20 MED ORDER — CARVEDILOL 12.5 MG PO TABS
12.5000 mg | ORAL_TABLET | Freq: Two times a day (BID) | ORAL | Status: DC
  Administered 2015-06-20: 12.5 mg via ORAL

## 2015-06-20 MED ORDER — PREDNISONE 20 MG PO TABS
20.0000 mg | ORAL_TABLET | Freq: Every day | ORAL | Status: DC
  Administered 2015-06-20 – 2015-06-21 (×2): 20 mg via ORAL
  Filled 2015-06-20 (×2): qty 1

## 2015-06-20 MED ORDER — PRAMOXINE-ZINC OXIDE IN MO 1-12.5 % RE OINT
TOPICAL_OINTMENT | Freq: Three times a day (TID) | RECTAL | Status: DC | PRN
  Filled 2015-06-20: qty 28.3

## 2015-06-20 MED ORDER — AMLODIPINE BESYLATE 2.5 MG PO TABS
2.5000 mg | ORAL_TABLET | Freq: Every day | ORAL | Status: DC
  Administered 2015-06-21: 2.5 mg via ORAL
  Filled 2015-06-20: qty 1

## 2015-06-20 NOTE — Progress Notes (Signed)
PATIENT ID: [id number] with chronic diastolic heart failure, CKD III-IV newly diagnosed with IgA glomerulonephritis, hypertension, COPD, DM type II, OSA and mild AS/MR here with volume overload and shortness of breath.   SUBJECTIVE:  Anna [name] continues to feel very short of breath and weak.  She denies pain.  She complains of orthopnea and has not noticed any change since admission.     PHYSICAL EXAM Filed Vitals:   06/20/15 0016 06/20/15 0623 06/20/15 0741 06/20/15 0818  BP: 123/39 124/36  135/45  Pulse: 56 58  57  Temp: 97.9 F (36.6 C) 97.8 F (36.6 C)    TempSrc: Oral Oral    Resp: 18 18  18   Height:      Weight:  238 lb (107.956 kg)    SpO2: 94% 94% 93% 95%   Gen: Chronically ill-appearing. Morbidly obese. No acute distress. Neck: No JVD at 45 degrees COR: Distant heart sounds. Regular rate and rhythm.  Lungs: Bibasilar crackles. No rhonchi or wheezes.  Abd: Soft, ND, NT. +BS Ext: 3+ pitting edema  LABS: Lab Results  Component Value Date   TROPONINI 0.06* 06/17/2015   Results for orders placed or performed during the hospital encounter of 06/17/15 (from the past 24 hour(s))  Glucose, capillary     Status: Abnormal   Collection Time: 06/19/15 11:39 AM  Result Value Ref Range   Glucose-Capillary 214 (H) 65 - 99 mg/dL   Comment 1 Notify RN    Comment 2 Document in Chart   CBC     Status: Abnormal   Collection Time: 06/19/15 12:44 PM  Result Value Ref Range   WBC 7.4 4.0 - 10.5 K/uL   RBC 3.00 (L) 3.87 - 5.11 MIL/uL   Hemoglobin 8.3 (L) 12.0 - 15.0 g/dL   HCT 27.0 (L) 36.0 - 46.0 %   MCV 90.0 78.0 - 100.0 fL   MCH 27.7 26.0 - 34.0 pg   MCHC 30.7 30.0 - 36.0 g/dL   RDW 17.1 (H) 11.5 - 15.5 %   Platelets  150 - 400 K/uL    PLATELET CLUMPS NOTED ON SMEAR, COUNT APPEARS ADEQUATE  Urinalysis, Routine w reflex microscopic (not at [facility])     Status: Abnormal   Collection Time: 06/19/15  1:14 PM  Result Value Ref Range   Color, Urine YELLOW YELLOW   APPearance CLOUDY (A) CLEAR   Specific Gravity, Urine 1.013 1.005 - 1.030   pH 5.0 5.0 - 8.0   Glucose, UA NEGATIVE NEGATIVE mg/dL   Hgb urine dipstick SMALL (A) NEGATIVE   Bilirubin Urine NEGATIVE NEGATIVE   Ketones, ur NEGATIVE NEGATIVE mg/dL   Protein, ur 30 (A) NEGATIVE mg/dL   Nitrite POSITIVE (A) NEGATIVE   Leukocytes, UA SMALL (A) NEGATIVE  Urine microscopic-add on     Status: Abnormal   Collection Time: 06/19/15  1:14 PM  Result Value Ref Range   Squamous Epithelial / LPF 6-30 (A) NONE SEEN   WBC, UA 6-30 0 - 5 WBC/hpf   RBC / HPF 6-30 0 - 5 RBC/hpf   Bacteria, UA MANY (A) NONE SEEN  Glucose, capillary     Status: Abnormal   Collection Time: 06/19/15  5:06 PM  Result Value Ref Range   Glucose-Capillary 211 (H) 65 - 99 mg/dL   Comment 1 Notify RN    Comment 2 Document in Chart   Glucose, capillary     Status: Abnormal   Collection Time: 06/19/15 10:14 PM  Result Value Ref Range  Glucose-Capillary 235 (H) 65 - 99 mg/dL  Basic metabolic panel     Status: Abnormal   Collection Time: 06/20/15  3:36 AM  Result Value Ref Range   Sodium 139 135 - 145 mmol/L   Potassium 5.0 3.5 - 5.1 mmol/L   Chloride 103 101 - 111 mmol/L   CO2 22 22 - 32 mmol/L   Glucose, Bld 223 (H) 65 - 99 mg/dL   BUN 153 (H) 6 - 20 mg/dL   Creatinine, Ser 3.59 (H) 0.44 - 1.00 mg/dL   Calcium 8.1 (L) 8.9 - 10.3 mg/dL   GFR calc non Af Amer 11 (L) >60 mL/min   GFR calc Af Amer 13 (L) >60 mL/min   Anion gap 14 5 - 15  CBC with Differential/Platelet     Status: Abnormal   Collection Time: 06/20/15  3:36 AM  Result Value Ref Range   WBC 6.6 4.0 - 10.5 K/uL   RBC 2.72 (L) 3.87 - 5.11 MIL/uL   Hemoglobin 7.7 (L) 12.0 - 15.0 g/dL   HCT 24.9 (L) 36.0 - 46.0 %   MCV 91.5 78.0 - 100.0 fL   MCH 28.3 26.0 - 34.0 pg   MCHC 30.9 30.0 - 36.0 g/dL   RDW 16.9 (H) 11.5 - 15.5 %   Platelets 108 (L) 150 - 400 K/uL   Neutrophils Relative % 92 %   Neutro Abs 6.1 1.7 - 7.7 K/uL   Lymphocytes Relative 7 %   Lymphs  Abs 0.5 (L) 0.7 - 4.0 K/uL   Monocytes Relative 1 %   Monocytes Absolute 0.0 (L) 0.1 - 1.0 K/uL   Eosinophils Relative 1 %   Eosinophils Absolute 0.0 0.0 - 0.7 K/uL   Basophils Relative 0 %   Basophils Absolute 0.0 0.0 - 0.1 K/uL  Glucose, capillary     Status: Abnormal   Collection Time: 06/20/15  6:37 AM  Result Value Ref Range   Glucose-Capillary 214 (H) 65 - 99 mg/dL    Intake/Output Summary (Last 24 hours) at 06/20/15 0931 Last data filed at 06/20/15 [id number]  Gross per 24 hour  Intake    368 ml  Output    700 ml  Net   -332 ml    Telemetry: No events  ASSESSMENT AND PLAN:  Principal Problem:   Anasarca associated with disorder of kidney Active Problems:   COPD (chronic obstructive pulmonary disease) (HCC)   Diabetic neuropathy (HCC)   Depression with anxiety   Gout   Volume overload   Hypoalbuminemia   CKD (chronic kidney disease), stage IV (HCC)   Acute respiratory failure with hypoxia (HCC)   Anemia, unspecified   Elevated troponin   Acute on chronic diastolic CHF (congestive heart failure) (HCC)   Acute respiratory failure (HCC)   1.  Acute on chronic diastolic heart failure:  Has normal LV systolic function.   Grade 2 diastolic dysfunction on recent echo.  It seems unlikely that this is her primary problem, thought it certainly is not helping.  Blood pressure is better  - Continue amlodipine 10 mg daily, carvedilol 25 mg q12h, clonidine 0.2 mg tid,   hydralazine to 75 mg tid and Imdur 60 mg daily  2.   Demand ischemia: troponin very mildly elevated but in a flat pattern.   This is not c/w ACS.   Likely due to her volume overload and renal insufficiency   3.   IgA glomerulonephritis: Continue diuresis per  Nephrology.    4.  Hypertensive heart disease  with CHF  - BP is now well controlled.      Anna [name], Wonda Cheng, MD  06/20/2015 9:31 AM    [address],  [place], [place]  [id number] Pager [phone] Phone:  [phone]; Fax: [phone]

## 2015-06-20 NOTE — Progress Notes (Addendum)
Pt. With scheduled anti-hypertensive this am. BP 124/36. On call MD aware. RN instructed to hold am dose of hydralazine.

## 2015-06-20 NOTE — Progress Notes (Signed)
Anna [name] [id number] DOB: [birth year] DOA: 06/17/2015 PCP: Chevis Pretty, FNP  Brief narrative:  81 y/o ? COPD not on home oxygen AKI/Stage iv-v CKD AORD/Anemia renal disease Mixed connective tissue disease follwed by derm [name] neg] OSA-needs sleep study Htn IDDM with neuropathy HLD COPD Bipolar GERD Myoview 04/24/15 EF 71% -Admitted 12/20-12/25 for GS pancreatitis and had ERCP-S/p Lap Chole 05/23/15 Prior colonoscopy 09/2014-5 polyps [adenoma] EGD was neg for source bleed Prior Degen disck disease s/p injections  Recent admisson CAP  Also found to have Worsening creatinine US guided biopsy kidney=IGA GN,cresenctic-sent home on Prednisone 40  Past medical history-As per Problem list Chart reviewed as below-   Consultants:  Nephrology  Cardiology  Procedures:    Antibiotics:     Subjective   UoP picked up overnight Feels swollen in hand and somewhat SOB No fever no chills no n/v/cp No n/v Eating fair only Seems scared regarding dilaysis    Objective    Interim History:   Telemetry:    Objective: Filed Vitals:   06/20/15 0741 06/20/15 0818 06/20/15 1300 06/20/15 1408  BP:  135/45 132/47   Pulse:  57 57   Temp:   98.7 F (37.1 C)   TempSrc:   Oral   Resp:  18 18   Height:      Weight:      SpO2: 93% 95% 96% 96%    Intake/Output Summary (Last 24 hours) at 06/20/15 1725 Last data filed at 06/20/15 0900  Gross per 24 hour  Intake    245 ml  Output    700 ml  Net   -455 ml    Exam:  General: eomi ncat Cardiovascular:  s1 s 2no m/r/g Respiratory:  Clear no rales Abdomen: soft no rebound Skin pitting edema-anasarca Neuro no deficit  Data Reviewed: Basic Metabolic Panel:  Recent Labs Lab 06/17/15 1445 06/18/15 0503 06/19/15 0546 06/20/15 0336  NA 143 142 140 139  K 4.8 4.9 4.9 5.0  CL 109 106 104 103  CO2 23 21* 23 22  GLUCOSE 114* 185* 277* 223*  BUN 122* 126* 144* 153*  CREATININE 2.84* 2.82* 3.37*  3.59*  CALCIUM 8.5* 8.5* 8.3* 8.1*   Liver Function Tests:  Recent Labs Lab 06/17/15 1445  AST 23  ALT 17  ALKPHOS 41  BILITOT 0.5  PROT 5.1*  ALBUMIN 2.3*    Recent Labs Lab 06/17/15 1445  LIPASE 50   No results for input(s): AMMONIA in the last 168 hours. CBC:  Recent Labs Lab 06/17/15 1445 06/19/15 1244 06/20/15 0336  WBC 7.7 7.4 6.6  NEUTROABS 7.3  --  6.1  HGB 8.7* 8.3* 7.7*  HCT 28.6* 27.0* 24.9*  MCV 91.1 90.0 91.5  PLT 127* PLATELET CLUMPS NOTED ON SMEAR, COUNT APPEARS ADEQUATE 108*   Cardiac Enzymes:  Recent Labs Lab 06/17/15 1445  TROPONINI 0.06*   BNP: Invalid input(s): POCBNP CBG:  Recent Labs Lab 06/19/15 1706 06/19/15 2214 06/20/15 0637 06/20/15 1245 06/20/15 1647  GLUCAP 211* 235* 214* 205* 226*    No results found for this or any previous visit (from the past 240 hour(s)).   Studies:              All Imaging reviewed and is as per above notation   Scheduled Meds: . acetaminophen  975 mg Oral QHS  . albuterol  3 mL Inhalation TID  . [START ON 06/21/2015] amLODipine  2.5 mg Oral Daily  . arformoterol  15 mcg Nebulization BID  .  aspirin EC  81 mg Oral Daily  . atorvastatin  20 mg Oral q1800  . budesonide-formoterol  1 puff Inhalation BID  . citalopram  20 mg Oral Daily  . cloNIDine  0.1 mg Oral TID  . febuxostat  40 mg Oral Daily  . ferrous sulfate  325 mg Oral Daily  . furosemide  160 mg Intravenous Q6H  . gabapentin  100 mg Oral BID  . heparin  5,000 Units Subcutaneous 3 times per day  . hydrALAZINE  75 mg Oral 3 times per day  . insulin aspart  0-9 Units Subcutaneous TID WC  . insulin aspart  3 Units Subcutaneous TID WC  . omega-3 acid ethyl esters  1 g Oral Daily  . pantoprazole  40 mg Oral Daily  . polyethylene glycol  17 g Oral Daily  . predniSONE  20 mg Oral Q breakfast  . sodium chloride flush  3 mL Intravenous Q12H   Continuous Infusions:    Assessment/Plan:  Acute respiratory failure with hypoxia  (HCC)-  Secondary to Acute likely diastolic heart failure complicated by IgA nephropathy and Anasarca  BNP not very specific in obese patients and troponin elevated secondary to renal disease  Initially diuresing well on  Lasix  80 IV 2 a day-->oliguric 2/18--Increased to lasix 100 q6 Net i/o -1.4 Backed down to prednisone from Solu-medrol. eventual dialysis this admission?  Elevated troponin  No further workup EKG benign and no chest pain   COPD (chronic obstructive pulmonary disease) (HCC) -Clinically her symptoms not consistent with acute exacerbation, continue albuterol inhaler every 4 when necessary, Brovana 15 g twice a day, Symbicort 80/4.5 one puff twice a day  Diabetic neuropathy (HCC)-decreased dose of gabapentin from 300 3 times a day to 100 daily   Hemorrhoids-ordered Hemorrhoidal cream    Depression with anxiety-continue citalopram 20 mg daily  Gout-continue using lower dose  Uloric 40 mg HTn-on multiple agents which we will continue inclusive of hydralazine 50 x 3/day-->75 tid per neprho, clonidine 0.2 x 3 day-->0.1 mg 2/19, Coreg 25 twice a day-->12.5 bid-->d/c 2/19 to help diurese more, amlodipine 10 daily-->2.5 2/19, Imdur 60 daily d/c 2/19  Hypoalbuminemia-see above discussion. Patient has low protein and low albumin likely secondary to IgA nephropathy.  CKD (chronic kidney disease), stage IV (HCC)See above discussion-  Anemia, unspecifiedLikely anemia of chronic disease. Patient has prior history of polyps   35 min discussion with family, > 50% f-2-f time Logn discussion regarding dialysis, Placement of TDC and complications from a generalist perspective I discussed this with 6 of her children She is undecided but willing to wait and see and maybe proceed with dialysis  [name], MD  [facility] Pager [phone] 06/20/2015, 5:25 PM    LOS: 3 days

## 2015-06-20 NOTE — Progress Notes (Signed)
Assessment/Plan: 1. CKD stage 4 secondary to IgA nephropathy(on Prednisone for proliferative changes) Creatinine 2-3 's Followed by Dr Lorrene Reid underwent renal biopsy in February. Patient was admitted with volume overload. Not responding well and with increased azotemia. Will increase diuretics again and if does not respond will need HD and catheter placement Monday.  2. HTN, controlled 3. Anemia with low iron, given iron- check stool OB   Subjective: Interval History: Poor UOP  Objective: Vital signs in last 24 hours: Temp:  [97.4 F (36.3 C)-98.2 F (36.8 C)] 97.8 F (36.6 C) (02/19 0623) Pulse Rate:  [56-60] 57 (02/19 0818) Resp:  [18] 18 (02/19 0818) BP: (123-138)/(36-50) 135/45 mmHg (02/19 0818) SpO2:  [93 %-97 %] 95 % (02/19 0818) Weight:  [107.956 kg (238 lb)] 107.956 kg (238 lb) (02/19 0623) Weight change: 0.68 kg (1 lb 8 oz)  Intake/Output from previous day: 02/18 0701 - 02/19 0700 In: 368 [P.O.:305; I.V.:3; IV Piggyback:60] Out: 800 [Urine:800] Intake/Output this shift: Total I/O In: 120 [P.O.:120] Out: -   General appearance: alert, cooperative and pale Resp: diminished breath sounds bibasilar Chest wall: no tenderness Cardio: regular rate and rhythm, S1, S2 normal, no murmur, click, rub or gallop Extremities: edema 2-3+   Lab Results:  Recent Labs  06/19/15 1244 06/20/15 0336  WBC 7.4 6.6  HGB 8.3* 7.7*  HCT 27.0* 24.9*  PLT PLATELET CLUMPS NOTED ON SMEAR, COUNT APPEARS ADEQUATE 108*   BMET:  Recent Labs  06/19/15 0546 06/20/15 0336  NA 140 139  K 4.9 5.0  CL 104 103  CO2 23 22  GLUCOSE 277* 223*  BUN 144* 153*  CREATININE 3.37* 3.59*  CALCIUM 8.3* 8.1*   No results for input(s): PTH in the last 72 hours. Iron Studies:  Recent Labs  06/18/15 0503  IRON 17*  TIBC 276   Studies/Results: No results found.  Scheduled: . acetaminophen  975 mg Oral QHS  . albuterol  3 mL Inhalation TID  . [START ON 06/21/2015] amLODipine  2.5 mg Oral  Daily  . arformoterol  15 mcg Nebulization BID  . aspirin EC  81 mg Oral Daily  . atorvastatin  20 mg Oral q1800  . budesonide-formoterol  1 puff Inhalation BID  . citalopram  20 mg Oral Daily  . cloNIDine  0.1 mg Oral TID  . febuxostat  40 mg Oral Daily  . ferrous sulfate  325 mg Oral Daily  . furosemide  160 mg Intravenous Q6H  . gabapentin  100 mg Oral BID  . heparin  5,000 Units Subcutaneous 3 times per day  . hydrALAZINE  75 mg Oral 3 times per day  . insulin aspart  0-9 Units Subcutaneous TID WC  . insulin aspart  3 Units Subcutaneous TID WC  . omega-3 acid ethyl esters  1 g Oral Daily  . pantoprazole  40 mg Oral Daily  . polyethylene glycol  17 g Oral Daily  . predniSONE  20 mg Oral Q breakfast  . sodium chloride flush  3 mL Intravenous Q12H     LOS: 3 days   Zayne Draheim C 06/20/2015,12:37 PM

## 2015-06-21 ENCOUNTER — Encounter (HOSPITAL_COMMUNITY): Payer: Self-pay

## 2015-06-21 LAB — GLUCOSE, CAPILLARY
GLUCOSE-CAPILLARY: 166 mg/dL — AB (ref 65–99)
GLUCOSE-CAPILLARY: 187 mg/dL — AB (ref 65–99)
Glucose-Capillary: 217 mg/dL — ABNORMAL HIGH (ref 65–99)
Glucose-Capillary: 146 mg/dL — ABNORMAL HIGH (ref 65–99)

## 2015-06-21 LAB — SURGICAL PCR SCREEN
MRSA, PCR: NEGATIVE
Staphylococcus aureus: NEGATIVE

## 2015-06-21 LAB — BASIC METABOLIC PANEL
Anion gap: 15 (ref 5–15)
BUN: 155 mg/dL — ABNORMAL HIGH (ref 6–20)
CALCIUM: 8.4 mg/dL — AB (ref 8.9–10.3)
CO2: 22 mmol/L (ref 22–32)
CREATININE: 3.47 mg/dL — AB (ref 0.44–1.00)
Chloride: 104 mmol/L (ref 101–111)
GFR calc non Af Amer: 12 mL/min — ABNORMAL LOW (ref >60)
GFR, EST AFRICAN AMERICAN: 14 mL/min — AB (ref >60)
GLUCOSE: 170 mg/dL — AB (ref 65–99)
Potassium: 4.4 mmol/L (ref 3.5–5.1)
Sodium: 141 mmol/L (ref 135–145)

## 2015-06-21 MED ORDER — ACETAMINOPHEN 650 MG RE SUPP: 650.0000 mg | Freq: Four times a day (QID) | RECTAL | Status: DC | PRN

## 2015-06-21 MED ORDER — POLYVINYL ALCOHOL 1.4 % OP SOLN
1.0000 [drp] | Freq: Four times a day (QID) | OPHTHALMIC | Status: DC | PRN
  Filled 2015-06-21: qty 15

## 2015-06-21 MED ORDER — TORSEMIDE 20 MG PO TABS
100.0000 mg | ORAL_TABLET | Freq: Two times a day (BID) | ORAL | Status: DC
  Administered 2015-06-21 – 2015-06-22 (×3): 100 mg via ORAL
  Filled 2015-06-21 (×3): qty 5

## 2015-06-21 MED ORDER — ONDANSETRON HCL 4 MG/2ML IJ SOLN: 4.0000 mg | Freq: Four times a day (QID) | INTRAMUSCULAR | Status: DC | PRN

## 2015-06-21 MED ORDER — ACETAMINOPHEN 325 MG PO TABS: 650.0000 mg | ORAL_TABLET | Freq: Four times a day (QID) | ORAL | Status: DC | PRN

## 2015-06-21 MED ORDER — ALBUTEROL SULFATE (2.5 MG/3ML) 0.083% IN NEBU
2.5000 mg | INHALATION_SOLUTION | RESPIRATORY_TRACT | Status: DC | PRN
  Administered 2015-06-21 – 2015-06-22 (×2): 2.5 mg via RESPIRATORY_TRACT
  Filled 2015-06-21 (×2): qty 3

## 2015-06-21 MED ORDER — ONDANSETRON 4 MG PO TBDP
4.0000 mg | ORAL_TABLET | Freq: Four times a day (QID) | ORAL | Status: DC | PRN
  Filled 2015-06-21: qty 1

## 2015-06-21 MED ORDER — BIOTENE DRY MOUTH MT LIQD: 15.0000 mL | OROMUCOSAL | Status: DC | PRN

## 2015-06-21 NOTE — Progress Notes (Signed)
Anna [name] [id number] DOB: 10-Sep-1934 DOA: 06/17/2015 PCP: Chevis Pretty, FNP  Brief narrative:  81 y/o ? COPD not on home oxygen AKI/Stage iv-v CKD AORD/Anemia renal disease Mixed connective tissue disease follwed by derm [name] neg] OSA-needs sleep study Htn IDDM with neuropathy HLD COPD Bipolar GERD Myoview 04/24/15 EF 71% -Admitted 12/20-12/25 for GS pancreatitis and had ERCP-S/p Lap Chole 05/23/15 Prior colonoscopy 09/2014-5 polyps [adenoma] EGD was neg for source bleed Prior Degen disck disease s/p injections  Recent admisson CAP  Also found to have Worsening creatinine US guided biopsy kidney=IGA GN,cresenctic-sent home on Prednisone 40  Past medical history-As per Problem list Chart reviewed as below-   Consultants:  Nephrology  Cardiology  Procedures:    Antibiotics:     Subjective   Doing ok Note multiple conversations and appreciate call from Dr. Moshe Cipro RE: [place] Patient and family seemed comfortable with decision for Hospice at home    Objective    Interim History:   Telemetry:    Objective: Filed Vitals:   06/21/15 0849 06/21/15 1227 06/21/15 1351 06/21/15 1430  BP:  169/38  175/71  Pulse:  68  66  Temp:      TempSrc:  Oral    Resp:  18    Height:      Weight:      SpO2: 91% 96% 93%     Intake/Output Summary (Last 24 hours) at 06/21/15 1645 Last data filed at 06/21/15 1640  Gross per 24 hour  Intake    370 ml  Output   3225 ml  Net  -2855 ml    Exam:  General: eomi ncat Cardiovascular:  s1 s 2no m/r/g Respiratory:  Clear no rales Abdomen: soft no rebound Skin pitting edema-anasarca Neuro no deficit  Data Reviewed: Basic Metabolic Panel:  Recent Labs Lab 06/17/15 1445 06/18/15 0503 06/19/15 0546 06/20/15 0336 06/21/15 0818  NA 143 142 140 139 141  K 4.8 4.9 4.9 5.0 4.4  CL 109 106 104 103 104  CO2 23 21* 23 22 22   GLUCOSE 114* 185* 277* 223* 170*  BUN 122* 126* 144* 153* 155*    CREATININE 2.84* 2.82* 3.37* 3.59* 3.47*  CALCIUM 8.5* 8.5* 8.3* 8.1* 8.4*   Liver Function Tests:  Recent Labs Lab 06/17/15 1445  AST 23  ALT 17  ALKPHOS 41  BILITOT 0.5  PROT 5.1*  ALBUMIN 2.3*    Recent Labs Lab 06/17/15 1445  LIPASE 50   No results for input(s): AMMONIA in the last 168 hours. CBC:  Recent Labs Lab 06/17/15 1445 06/19/15 1244 06/20/15 0336  WBC 7.7 7.4 6.6  NEUTROABS 7.3  --  6.1  HGB 8.7* 8.3* 7.7*  HCT 28.6* 27.0* 24.9*  MCV 91.1 90.0 91.5  PLT 127* PLATELET CLUMPS NOTED ON SMEAR, COUNT APPEARS ADEQUATE 108*   Cardiac Enzymes:  Recent Labs Lab 06/17/15 1445  TROPONINI 0.06*   BNP: Invalid input(s): POCBNP CBG:  Recent Labs Lab 06/20/15 1647 06/20/15 2307 06/21/15 0540 06/21/15 1123 06/21/15 1638  GLUCAP 226* 153* 166* 217* 187*    Recent Results (from the past 240 hour(s))  Culture, Urine     Status: None (Preliminary result)   Collection Time: 06/20/15  2:22 AM  Result Value Ref Range Status   Specimen Description URINE, CATHETERIZED  Final   Special Requests NONE  Final   Culture >=100,000 COLONIES/mL GRAM NEGATIVE RODS  Final   Report Status PENDING  Incomplete  Surgical pcr screen     Status:  None   Collection Time: 06/21/15  4:09 AM  Result Value Ref Range Status   MRSA, PCR NEGATIVE NEGATIVE Final   Staphylococcus aureus NEGATIVE NEGATIVE Final    Comment:        The Xpert SA Assay (FDA approved for NASAL specimens in patients over 57 years of age), is one component of a comprehensive surveillance program.  Test performance has been validated by [facility] for patients greater than or equal to 107 year old. It is not intended to diagnose infection nor to guide or monitor treatment.      Studies:              All Imaging reviewed and is as per above notation   Scheduled Meds: . acetaminophen  975 mg Oral QHS  . albuterol  3 mL Inhalation TID  . amLODipine  2.5 mg Oral Daily  . arformoterol  15 mcg  Nebulization BID  . aspirin EC  81 mg Oral Daily  . atorvastatin  20 mg Oral q1800  . budesonide-formoterol  1 puff Inhalation BID  . citalopram  20 mg Oral Daily  . cloNIDine  0.1 mg Oral TID  . febuxostat  40 mg Oral Daily  . ferrous sulfate  325 mg Oral Daily  . furosemide  160 mg Intravenous Q6H  . gabapentin  100 mg Oral BID  . heparin  5,000 Units Subcutaneous 3 times per day  . hydrALAZINE  75 mg Oral 3 times per day  . insulin aspart  0-9 Units Subcutaneous TID WC  . insulin aspart  3 Units Subcutaneous TID WC  . omega-3 acid ethyl esters  1 g Oral Daily  . pantoprazole  40 mg Oral Daily  . polyethylene glycol  17 g Oral Daily  . predniSONE  20 mg Oral Q breakfast  . sodium chloride flush  3 mL Intravenous Q12H   Continuous Infusions:    Assessment/Plan:  Acute respiratory failure with hypoxia (HCC)-  Secondary to Acute likely diastolic heart failure complicated by IgA nephropathy and Anasarca  BNP not very specific in obese patients and troponin elevated secondary to renal disease  Initially diuresing well on  Lasix  80 IV 2 a day-->oliguric 2/18--Increased to lasix 100 q6 Would keep on Lasix to comfort PO-Aim for 200 mg Q8 Net i/o -4.3 Backed down to prednisone from Solu-medrol. eventual dialysis this admission?  Elevated troponin  No further workup EKG benign and no chest pain   COPD (chronic obstructive pulmonary disease) (HCC) -Clinically her symptoms not consistent with acute exacerbation, continue albuterol inhaler every 4 when necessary, Brovana 15 g twice a day, Symbicort 80/4.5 one puff twice a day  Diabetic neuropathy (HCC)-decreased dose of gabapentin from 300 3 times a day to 100 daily   Hemorrhoids-ordered Hemorrhoidal cream    Depression with anxiety-continue citalopram 20 mg daily  Gout-continue using lower dose Uloric 40 mg HTn-simplified meds from Hydralazine 75 tid per neprho, clonidine 0.2 x 3 day-->0.1 mg 2/19, Coreg d/c 2/19 to help  diurese more, amlodipine 10 daily-->2.5 2/19, Imdur 60 daily d/c 2/19  Hypoalbuminemia-see above discussion. Patient has low protein and low albumin likely secondary to IgA nephropathy.  CKD (chronic kidney disease), stage IV (HCC)See above discussion-  Anemia, unspecifiedLikely anemia of chronic disease. Patient has prior history of polyps  Patient and family electing for Hopsice at home- Main contact in room to have discussion tomorrow witth would be Verna Czech [phone]  [name], MD  [facility] Pager [phone] 06/21/2015,  4:45 PM    LOS: 4 days

## 2015-06-21 NOTE — Care Management Important Message (Signed)
Important Message  Patient Details  Name: Anna [name] MRN: [id number] Date of Birth: 05/24/1934   Medicare Important Message Given:  Yes    [name] 06/21/2015, 11:14 AM

## 2015-06-21 NOTE — Progress Notes (Signed)
Subjective:  no significant change- room full of family members  Objective Vital signs in last 24 hours: Filed Vitals:   06/21/15 0414 06/21/15 0546 06/21/15 0800 06/21/15 0849  BP:  103/83 143/41   Pulse:  65 66   Temp:  97.5 F (36.4 C) 97.9 F (36.6 C)   TempSrc:  Oral Oral   Resp:   24   Height:      Weight:  98.022 kg (216 lb 1.6 oz)    SpO2: 96% 95% 97% 91%   Weight change: -9.934 kg (-21 lb 14.4 oz)  Intake/Output Summary (Last 24 hours) at 06/21/15 1029 Last data filed at 06/21/15 0930  Gross per 24 hour  Intake    370 ml  Output   1876 ml  Net  -1506 ml    Assessment/ Plan: Pt is a 81 y.o. yo female with obesity, COPD, HTN, DM- fairly recent lap chole and pancreatitis ad also recent diagnosis of Iga nephropathy with crescents who was admitted on 06/17/2015 with volume overload and hypoxia - now with worsening renal and volume status Assessment/Plan: 1. Renal- baseline stage 4 CKD- now worsening with hypoxia/volume overload and FTT as well as azotemia on steroids for IgA- family wanted my honest opinion about how she would do on dialysis given her comorbid issues and I had to be truthful and say that even though it could improve issues (azotemia and volume ) in the short term- she would be dialysis dependent and given her marginal level of functioning prior to that she would likely continue to have complications and chronic dialysis would not overall improve her QOL.  Family wants Momma to make decision but not sure she can comprehend- when I told her what dialysis entails she said it was "too much" and family tells me that all she wants is to go home 2. HTN./volume - nonoliguric on lasix 3. Anemia- present with hgb less than 8- supportive care  4. Dispo- I have talked with hospitalist for palliative care consult.  It is my honest opinion that she would not thrive on dialysis but if they desire, as I said it should make azotemia and volume better in the short term      Travis Purk A    Labs: Basic Metabolic Panel:  Recent Labs Lab 06/19/15 0546 06/20/15 0336 06/21/15 0818  NA 140 139 141  K 4.9 5.0 4.4  CL 104 103 104  CO2 23 22 22   GLUCOSE 277* 223* 170*  BUN 144* 153* 155*  CREATININE 3.37* 3.59* 3.47*  CALCIUM 8.3* 8.1* 8.4*   Liver Function Tests:  Recent Labs Lab 06/17/15 1445  AST 23  ALT 17  ALKPHOS 41  BILITOT 0.5  PROT 5.1*  ALBUMIN 2.3*    Recent Labs Lab 06/17/15 1445  LIPASE 50   No results for input(s): AMMONIA in the last 168 hours. CBC:  Recent Labs Lab 06/17/15 1445 06/19/15 1244 06/20/15 0336  WBC 7.7 7.4 6.6  NEUTROABS 7.3  --  6.1  HGB 8.7* 8.3* 7.7*  HCT 28.6* 27.0* 24.9*  MCV 91.1 90.0 91.5  PLT 127* PLATELET CLUMPS NOTED ON SMEAR, COUNT APPEARS ADEQUATE 108*   Cardiac Enzymes:  Recent Labs Lab 06/17/15 1445  TROPONINI 0.06*   CBG:  Recent Labs Lab 06/20/15 0637 06/20/15 1245 06/20/15 1647 06/20/15 2307 06/21/15 0540  GLUCAP 214* 205* 226* 153* 166*    Iron Studies: No results for input(s): IRON, TIBC, TRANSFERRIN, FERRITIN in the last 72 hours. Studies/Results: No results  found. Medications: Infusions:    Scheduled Medications: . acetaminophen  975 mg Oral QHS  . albuterol  3 mL Inhalation TID  . amLODipine  2.5 mg Oral Daily  . arformoterol  15 mcg Nebulization BID  . aspirin EC  81 mg Oral Daily  . atorvastatin  20 mg Oral q1800  . budesonide-formoterol  1 puff Inhalation BID  . citalopram  20 mg Oral Daily  . cloNIDine  0.1 mg Oral TID  . febuxostat  40 mg Oral Daily  . ferrous sulfate  325 mg Oral Daily  . furosemide  160 mg Intravenous Q6H  . gabapentin  100 mg Oral BID  . heparin  5,000 Units Subcutaneous 3 times per day  . hydrALAZINE  75 mg Oral 3 times per day  . insulin aspart  0-9 Units Subcutaneous TID WC  . insulin aspart  3 Units Subcutaneous TID WC  . omega-3 acid ethyl esters  1 g Oral Daily  . pantoprazole  40 mg Oral Daily  .  polyethylene glycol  17 g Oral Daily  . predniSONE  20 mg Oral Q breakfast  . sodium chloride flush  3 mL Intravenous Q12H    have reviewed scheduled and prn medications.  Physical Exam: General: obese- staring off into space- says some words Heart: RRR Lungs: CBS bilat Abdomen: obese, distended, slightly painful  Extremities: pitting edema    06/21/2015,10:29 AM  LOS: 4 days

## 2015-06-21 NOTE — Care Management Important Message (Signed)
Important Message  Patient Details  Name: Anna [name] MRN: [id number] Date of Birth: 1934-05-22   Medicare Important Message Given:       [name] 06/21/2015, 11:13 AM

## 2015-06-21 NOTE — Evaluation (Signed)
Clinical/Bedside Swallow Evaluation Patient Details  Name: Anna [name] MRN: [id number] Date of Birth: 1934/07/04  Today's Date: 06/21/2015 Time:        Past Medical History:  Past Medical History  Diagnosis Date  . Hypertension   . COPD (chronic obstructive pulmonary disease) ([place])   . Neuromuscular disorder ([place])   . Depression with anxiety   . Osteopenia   . Arthritis   . GERD (gastroesophageal reflux disease)   . Hyperlipidemia   . Obesity   . Vitamin B12 deficiency   . Chronic kidney disease (CKD), stage IV (severe) ([place])   . Sleep apnea     no cpap use  . Anemia     iron infusion- x2 recent , last 5'16  . Hx of adenomatous colonic polyps 09/2014  . Diabetes mellitus ([place])     type ll   dx about 30 yrs ago.  [name] HNP (herniated nucleus pulposus with myelopathy), thoracic   . Chronic diastolic CHF (congestive heart failure) ([place])   . Pneumonia 05/2015  . Peripheral neuropathy ([place])   . Aortic stenosis     a. Mild by echo 04/2015.  . Mitral regurgitation     a. Mild by echo 04/2015.  . Biliary acute pancreatitis     a. s/p ERCP 04/2015; subsequent cholecystectomy 05/2015.  . Macular rash     a. 06/2015: Per IM note, "acular rash in upper extremity legs and back/skin vasculitis/ MCTD (mixed connective tissue disease)."  . Acute renal insufficiency     a. Kidney bx 06/2015: "PROLIFERATIVE AND EARLY SCLEROSING IGA GLOMERULONEPHRITIS WITH 3% SEGMENTAL FIBROCELLULAR CRESCENT FORMATION (1 OF 36 GLOMERULI). MILD ARTERIOSCLEROSIS WITH FOCAL MILD TUBULOINTERSTITIAL SCARRING AND SUPERIMPOSED MILD ACUTE TUBULAR INJURY."  . Normal cardiac stress test 04/2015   Past Surgical History:  Past Surgical History  Procedure Laterality Date  . Breast surgery  April 1996    needle biopsy   . Tubal ligation    . Cataract extraction, bilateral Bilateral   . Esophagogastroduodenoscopy N/A 10/19/2014    Procedure: ESOPHAGOGASTRODUODENOSCOPY (EGD);  Surgeon: Gatha Mayer, MD;  Location: [facility];  Service: Endoscopy;  Laterality: N/A;  . Colonoscopy N/A 10/19/2014    Procedure: COLONOSCOPY;  Surgeon: Gatha Mayer, MD;  Location: [facility] END[facility]COPY;  Service: Endoscopy;  Laterality: N/A;  . Ercp N/A 04/22/2015    Procedure: ENDOSCOPIC RETROGRADE CHOLANGIOPANCREATOGRAPHY (ERCP);  Surgeon: Milus Banister, MD;  Location: [facility];  Service: Endoscopy;  Laterality: N/A;  . Laparoscopic cholecystectomy  05/25/2015  . Cholecystectomy N/A 05/25/2015    Procedure: LAPAROSCOPIC CHOLECYSTECTOMY;  Surgeon: Coralie Keens, MD;  Location: [facility] OR;  Service: General;  Laterality: N/A;   HPI:  81 y.o. female with h/o COPD, HTN, HLD, bipolar disorder, GERD, neuromuscular disorder and IDDM with neuropathy who was recently d/c 2/10 (after admission with pneumonia) and has been noncompliant with salt restriction since. Pt presented to ED after home health RN noted hypoxia and SOB. CXR 2/16 diffuse increasing perihilar densities consistent with pulmonary edema. BSE 04/23/2015 demonstrated SOB and decreased apneic period during swallow, however deemed to be [facility] and diet was not modified.   Assessment / Plan / Recommendation Clinical Impression  Pt reported globus sensation with meat since admission to hospital. Mild dyspnea following thin and solid; eructation. No cough, throat clear or wet vocal quality. Respiratory and esophageal observations would have greatest impact on safety during swallow.       Aspiration Risk   (mild-mod)    Diet Recommendation Regular;Thin  liquid   Medication Administration: Whole meds with liquid Supervision: Patient able to self feed Compensations: Small sips/bites;Slow rate (rest breaks)    Other  Recommendations Oral Care Recommendations: Oral care BID   Follow up Recommendations  None    Frequency and Duration            Prognosis        Swallow Study   General HPI: 81 y.o. female with h/o COPD, HTN, HLD, bipolar disorder, GERD, neuromuscular disorder  and IDDM with neuropathy who was recently d/c 2/10 (after admission with pneumonia) and has been noncompliant with salt restriction since. Pt presented to ED after home health RN noted hypoxia and SOB. CXR 2/16 diffuse increasing perihilar densities consistent with pulmonary edema. BSE 04/23/2015 demonstrated SOB and decreased apneic period during swallow, however deemed to be [facility] and diet was not modified. Previous Swallow Assessment: see HPI Respiratory Status: Nasal cannula History of Recent Intubation: No Behavior/Cognition: Alert;Cooperative;Pleasant mood Oral Care Completed by SLP: No Oral Cavity - Dentition: Missing dentition;Poor condition Vision: Functional for self-feeding Baseline Vocal Quality: Normal Volitional Cough: Strong Volitional Swallow: Able to elicit    Oral/Motor/Sensory Function Overall Oral Motor/Sensory Function: Within functional limits   Ice Chips Ice chips: Not tested   Thin Liquid Thin Liquid: Impaired Presentation: Self Fed;Straw Pharyngeal  Phase Impairments: Change in Vital Signs (mild dyspnea)    Nectar Thick Nectar Thick Liquid: Not tested   Honey Thick Honey Thick Liquid: Not tested   Puree Puree: Not tested   Solid   GO   Solid: Within functional limits        Houston Siren 06/21/2015,2:09 PM  Orbie Pyo Colvin Caroli.Ed [employer] [phone]

## 2015-06-21 NOTE — Progress Notes (Signed)
[facility] Prog Note (Social visit)  I see Anna [name] in the office for her CKD.  I reviewed the chart and all recent events, examined the patient, and spoke with her family members.  Anna [name] has told me (at a time when she was mentally capable of making decisions) that she did not wish to ever undergo dialysis - in the present of 3 daughters - 2 of whom I talked with today who acknowledged that conversation. Her function at that time (prior to all the recent health events (was marginal).  I fully agree with Dr. Shelva Majestic assessment that Anna [name] would NOT thrive on dialysis and that it would not improve her quality of life.  The daughters now agree - I would not advise pursuing. I agree totally with Palliative evaluation, and home with Hospice care. I will check in with her again.   Jamal Maes, MD [place] Pager 06/21/2015, 12:49 PM

## 2015-06-22 LAB — RENAL FUNCTION PANEL
ALBUMIN: 2.3 g/dL — AB (ref 3.5–5.0)
ANION GAP: 15 (ref 5–15)
BUN: 156 mg/dL — ABNORMAL HIGH (ref 6–20)
CALCIUM: 8.6 mg/dL — AB (ref 8.9–10.3)
CO2: 22 mmol/L (ref 22–32)
Chloride: 104 mmol/L (ref 101–111)
Creatinine, Ser: 3.28 mg/dL — ABNORMAL HIGH (ref 0.44–1.00)
GFR calc non Af Amer: 12 mL/min — ABNORMAL LOW (ref >60)
GFR, EST AFRICAN AMERICAN: 14 mL/min — AB (ref >60)
Glucose, Bld: 187 mg/dL — ABNORMAL HIGH (ref 65–99)
PHOSPHORUS: 6.5 mg/dL — AB (ref 2.5–4.6)
POTASSIUM: 3.9 mmol/L (ref 3.5–5.1)
SODIUM: 141 mmol/L (ref 135–145)

## 2015-06-22 LAB — GLUCOSE, CAPILLARY
Glucose-Capillary: 190 mg/dL — ABNORMAL HIGH (ref 65–99)
Glucose-Capillary: 201 mg/dL — ABNORMAL HIGH (ref 65–99)

## 2015-06-22 LAB — URINE CULTURE: Culture: >=100000

## 2015-06-22 MED ORDER — TORSEMIDE 100 MG PO TABS: 100.0000 mg | ORAL_TABLET | Freq: Two times a day (BID) | ORAL | Status: AC

## 2015-06-22 MED ORDER — MORPHINE SULFATE (PF) 2 MG/ML IV SOLN
INTRAVENOUS | Status: AC
  Administered 2015-06-22: 1 mg via INTRAVENOUS
  Filled 2015-06-22: qty 1

## 2015-06-22 MED ORDER — POLYVINYL ALCOHOL 1.4 % OP SOLN: 1.0000 [drp] | Freq: Four times a day (QID) | OPHTHALMIC | Status: AC | PRN

## 2015-06-22 MED ORDER — ONDANSETRON 4 MG PO TBDP: 4.0000 mg | ORAL_TABLET | Freq: Four times a day (QID) | ORAL | Status: AC | PRN

## 2015-06-22 MED ORDER — HYDROCORTISONE 2.5 % RE CREA: TOPICAL_CREAM | Freq: Three times a day (TID) | RECTAL | Status: AC | PRN

## 2015-06-22 MED ORDER — FENTANYL 25 MCG/HR TD PT72
25.0000 ug | MEDICATED_PATCH | TRANSDERMAL | Status: DC
  Administered 2015-06-22: 25 ug via TRANSDERMAL
  Filled 2015-06-22: qty 1

## 2015-06-22 MED ORDER — FENTANYL 25 MCG/HR TD PT72: 25.0000 ug | MEDICATED_PATCH | TRANSDERMAL | Status: DC

## 2015-06-22 MED ORDER — LORAZEPAM 2 MG/ML IJ SOLN
1.0000 mg | Freq: Once | INTRAMUSCULAR | Status: AC
  Administered 2015-06-22: 2 mg via INTRAVENOUS
  Filled 2015-06-22: qty 1

## 2015-06-22 MED ORDER — FENTANYL 25 MCG/HR TD PT72: 25.0000 ug | MEDICATED_PATCH | TRANSDERMAL | Status: AC

## 2015-06-22 MED ORDER — MORPHINE SULFATE (PF) 2 MG/ML IV SOLN
1.0000 mg | INTRAVENOUS | Status: DC | PRN
  Administered 2015-06-22: 1 mg via INTRAVENOUS

## 2015-06-22 NOTE — Consult Note (Signed)
   [facility] CM Inpatient Consult   06/22/2015  Anna [name] 30-Dec-1934 [id number] Patient was eligible for [place] Management services.  Chart encounter reviewed and discussed the patient in Progression meeting rounds and found that the patient and family has chosen home with hospice. With this choice, the patient will have full case management services through Hospice and [facility] community case management will not be needed at this time.  For questions, please contact: Natividad Brood, RN BSN [facility] Liaison  [phone] business mobile phone Toll free office [phone]

## 2015-06-22 NOTE — Progress Notes (Signed)
CSW consulted for [facility] transport home  Patient will discharge to home Anticipated discharge date: 2/21 Family notified: at bedside Transportation by [facility]- called at 2:30pm  [place] signing off.  Domenica Reamer, [place] Social Worker [phone]

## 2015-06-22 NOTE — Progress Notes (Signed)
Pt. Discharged to home via [place].Pt. Alert and in stable condition.  Discharge instructions given to family at bedside.

## 2015-06-22 NOTE — Progress Notes (Signed)
Pt has orders to be discharged home with home hospice. Home oxygen has been delivered. Pt comfortable and waiting for transportation.   Maurene Capes RN

## 2015-06-22 NOTE — Care Management Note (Signed)
Case Management Note  Patient Details  Name: Anna [name] MRN: [id number] Date of Birth: 02-10-1935            Action/Plan: Patient is to be discharged home with home hospice care. Hospice choice offered, Rip Harbour daughter ( present in room with patient) requested [facility] [phone]) Referral made as requested, talked to [facility] with Hospice; clinical information faxed as requested - [phone].  Expected Discharge Date:   06/22/2015               Expected Discharge Plan:  [place]  Discharge planning Services  CM Consult  Choice offered to:  Adult Children  HH Agency:  [facility] Status of Service:  In process, will continue to follow  Medicare Important Message Given:  Yes  Sherrilyn Rist [phone] 06/22/2015, 10:13 AM

## 2015-06-22 NOTE — Discharge Summary (Signed)
Discharge Summary  Anna [name] [id number] DOB: 1975-09-19 DOA: 06/17/2015  PCP: Anna Pretty, FNP  Admit date: 06/17/2015 Discharge date: 06/22/2015  Time spent: 40 minutes  Recommendations for Outpatient Follow-up:  Home hospice choice requested Will need oxygen on d/c home 2 L   Discharge Diagnoses:  Principal Problem:   Anasarca associated with disorder of kidney Active Problems:   COPD (chronic obstructive pulmonary disease) (HCC)   Diabetic neuropathy ([place])   Depression with anxiety   Gout   Volume overload   Hypoalbuminemia   CKD (chronic kidney disease), stage IV (HCC)   Acute respiratory failure with hypoxia (HCC)   Anemia, unspecified   Elevated troponin   Acute on chronic diastolic CHF (congestive heart failure) ([place])   Acute respiratory failure ([place])   Discharge Condition: gaurded  Diet recommendation: liberalie to comfort feeds  Filed Weights   06/19/15 0430 06/20/15 0623 06/21/15 0546  Weight: 107.276 kg (236 lb 8 oz) 107.956 kg (238 lb) 98.022 kg (216 lb 1.6 oz)    History of present illness:  81 y/o ? 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  She elected to go home with hospice and after discussion regarding hospice was explained to her and her children and she understands that we will minimize medications and start pursuing end-of-life care.  We have minimize multiple medications as per [facility] list and she will be discharged with hospice and home oxygen    Discharge Exam: Filed Vitals:   06/21/15 2108 06/22/15 0624  BP: 147/47 138/45  Pulse: 63 66  Temp: 97.6 F (36.4 C) 98 F (36.7 C)  Resp:      General: Alert and oriented in no distress but weak Cardiovascular: S1-S2 no murmur rub or gallop Respiratory: Clinically clear no added sound Lower extremity swelling and hand swelling has diminished significantly  Discharge Instructions   Discharge Instructions    Diet - low sodium heart healthy    Complete by:  As directed      Discharge instructions    Complete by:  As directed   Patient to d/c home with Home hospice     Increase activity slowly    Complete by:  As directed           Current Discharge Medication List    START taking these medications   Details  fentaNYL (DURAGESIC) 25 MCG/HR patch Place 1 patch (25 mcg total) onto the skin every 3 (three) days. Qty: 5 patch, Refills: 0    hydrocortisone (ANUSOL-HC) 2.5 % rectal cream Place rectally 3 (three) times daily as needed for hemorrhoids or itching. Qty: 30 g, Refills: 0    ondansetron (ZOFRAN-ODT)  4 MG disintegrating tablet Take 1 tablet (4 mg total) by mouth every 6 (six) hours as needed for nausea. Qty: 20 tablet, Refills: 0    polyvinyl alcohol (LIQUIFILM TEARS) 1.4 % ophthalmic solution Place 1 drop into both eyes 4 (four) times daily as needed for dry eyes. Qty: 15 mL, Refills: 0    torsemide (DEMADEX) 100 MG tablet Take 1 tablet (100 mg total) by mouth 2 (two) times daily. Qty: 40 tablet, Refills: 0       CONTINUE these medications which have NOT CHANGED   Details  acetaminophen (TYLENOL) 650 MG CR tablet Take 1,300 mg by mouth at bedtime.    albuterol (PROVENTIL HFA;VENTOLIN HFA) 108 (90 Base) MCG/ACT inhaler Inhale 1 puff into the lungs every 4 (four) hours as needed for wheezing or shortness of breath.    arformoterol (BROVANA) 15 MCG/2ML NEBU Take 15 mcg by nebulization 2 (two) times daily.    budesonide-formoterol (SYMBICORT) 80-4.5 MCG/ACT inhaler Inhale 1 puff into the lungs 2 (two) times daily. Qty: 1 Inhaler, Refills: 5   Associated Diagnoses: Simple chronic bronchitis (HCC)    citalopram (CELEXA) 20 MG tablet TAKE 1 TABLET BY MOUTH EVERY DAY Qty: 30 tablet, Refills: 5    febuxostat (ULORIC) 40 MG tablet Take 1 tablet (40 mg total) by mouth daily. Qty: 30 tablet, Refills: 3    polyethylene glycol (MIRALAX / GLYCOLAX) packet Take 17 g by mouth daily. And an additional packet if needed    Alcohol Swabs (PHARMACIST CHOICE ALCOHOL) PADS USE 2 TIMES DAILY FOR DIABETIC TESTING Qty: 200 each, Refills: 2      STOP taking these medications     amLODipine (NORVASC) 10 MG tablet      aspirin 81 MG tablet      atorvastatin (LIPITOR) 20 MG tablet      carvedilol (COREG) 25 MG tablet      cloNIDine (CATAPRES) 0.2 MG tablet      fenofibrate (TRICOR) 145 MG tablet      Ferrous Sulfate (IRON) 325 (65 Fe) MG TABS      furosemide (LASIX) 40 MG tablet      gabapentin (NEURONTIN) 300 MG capsule      hydrALAZINE (APRESOLINE) 50 MG tablet      Insulin Lispro Prot & Lispro (HUMALOG MIX 75/25 KWIKPEN) (75-25) 100 UNIT/ML Kwikpen      isosorbide mononitrate (IMDUR) 30 MG 24 hr tablet      Omega-3 Fatty Acids (FISH OIL) 1000 MG CAPS      omeprazole (PRILOSEC) 40 MG capsule      oxyCODONE-acetaminophen (PERCOCET) 7.5-325 MG tablet      EASY TOUCH INSULIN SYRINGE 31G X 5/16" 1 ML MISC      EASY TOUCH PEN NEEDLES 31G X 8 MM MISC      glucose blood (ACCU-CHEK AVIVA PLUS) test  strip      glucose blood (ONETOUCH VERIO) test strip      glucose blood test strip      oxyCODONE-acetaminophen (PERCOCET) 10-325 MG tablet      predniSONE (DELTASONE) 20 MG tablet        Allergies  Allergen Reactions  . Ace Inhibitors Other (See Comments)    Kidney failure  . Niaspan [Niacin Er] Other (See Comments)    flushing  . Other Rash    Pt started Norvasc, clonidine, and isosorbide on 04-26-15.  Not sure which one is causing the rash.  Pls update after pt goes to [facility] today  The results of significant diagnostics from this hospitalization (including imaging, microbiology, ancillary and laboratory) are listed below for reference.    Significant Diagnostic Studies: Dg Chest 2 View  06/17/2015  CLINICAL DATA:  Shortness of breath for several weeks EXAM: CHEST  2 VIEW COMPARISON:  05/31/2015 FINDINGS: Cardiac shadow is enlarged. Aortic calcifications are again noted. Lungs are well aerated but demonstrate evidence of small left effusion stable from the prior exam. Diffuse bilateral perihilar changes are now seen consistent with pulmonary edema. No pneumothorax is noted. No acute bony abnormality is seen. IMPRESSION: Diffuse increasing perihilar densities consistent with pulmonary edema. Electronically Signed   By: Inez Catalina M.D.   On: 06/17/2015 14:36   Dg Chest 2 View  05/31/2015  CLINICAL DATA:  Shortness of Breath EXAM: CHEST  2 VIEW COMPARISON:  05/26/2015 FINDINGS: Cardiomediastinal silhouette is stable. Persistent left small pleural effusion with left basilar atelectasis or infiltrate. New linear atelectasis or early infiltrate in lingula. Right lung is clear. No pulmonary edema. IMPRESSION: Persistent left small pleural effusion with left basilar atelectasis or infiltrate. New linear atelectasis or early infiltrate in lingula. Right lung is clear. Electronically Signed   By: Lahoma Crocker M.D.   On: 05/31/2015 09:55   Dg Chest 2 View  05/26/2015  CLINICAL DATA:  Fever  and hypoxia. EXAM: CHEST  2 VIEW COMPARISON:  04/23/2015 FINDINGS: The cardiac silhouette is enlarged. Mediastinal contours appear intact. Torturous knee and atherosclerotic disease of the aorta are seen. There is no evidence of pneumothorax. Low lung volumes. Mild pulmonary vascular congestion. There is a patchy left lower lobe airspace consolidation. There is a probable small left pleural effusion. Osseous structures are without acute abnormality. Soft tissues are grossly normal. IMPRESSION: Low lung volumes with patchy left lower lobe airspace consolidation and small left pleural effusion, which may represent developing pneumonia. Mild pulmonary vascular congestion. Electronically Signed   By: Fidela Salisbury M.D.   On: 05/26/2015 22:52   US Renal  05/27/2015  CLINICAL DATA:  Acute kidney injury. Bilateral renal atrophy seen on previous CT and ultrasound examinations. EXAM: RENAL / URINARY TRACT ULTRASOUND COMPLETE COMPARISON:  CT abdomen and pelvis 04/20/2015 FINDINGS: Right Kidney: Length: 12.6 cm. Mild diffuse parenchymal thinning. No solid mass or hydronephrosis. Left Kidney: Left kidney is not visualized due to overlying bowel gas. Unable to reposition patient for better visualization. Bladder: No bladder wall thickening or intraluminal filling defects. IMPRESSION: Mild diffuse parenchymal atrophy in the right kidney. Left kidney is not visualized due to bowel gas. Electronically Signed   By: Lucienne Capers M.D.   On: 05/27/2015 02:17   US Biopsy  06/03/2015  CLINICAL DATA:  Acute on chronic renal failure. The patient requires renal biopsy. EXAM: ULTRASOUND GUIDED CORE BIOPSY OF LEFT KIDNEY MEDICATIONS: 1.0 mg IV Versed; 25 mcg IV Fentanyl Total Moderate Sedation Time: 10 minutes. The patient's level of consciousness and physiologic status were continuously monitored during the procedure by Radiology nursing. PROCEDURE: The procedure, risks, benefits, and alternatives were explained to the  patient. Questions regarding the procedure were encouraged and answered. The patient understands and consents to the procedure. A time-out was performed prior to the procedure. The left flank region was prepped with chlorhexidine in a sterile fashion, and a sterile drape was applied covering the operative field. A sterile gown and sterile gloves were used for the procedure. Local anesthesia was provided with 1% Lidocaine. Ultrasound was used to localize the left kidney. Under direct ultrasound guidance, a 16 gauge core  biopsy device was advanced. Three separate core biopsy samples were obtained of lower pole cortex. Core samples were submitted in saline. COMPLICATIONS: None. FINDINGS: Preliminary imaging shows much better visualization of the left kidney compared to the right from a posterior approach. Solid tissue was obtained with biopsy. IMPRESSION: Ultrasound-guided core biopsy performed of the left kidney at the level of lower pole cortex. Electronically Signed   By: Aletta Edouard M.D.   On: 06/03/2015 11:57    Microbiology: Recent Results (from the past 240 hour(s))  Culture, Urine     Status: None (Preliminary result)   Collection Time: 06/20/15  2:22 AM  Result Value Ref Range Status   Specimen Description URINE, CATHETERIZED  Final   Special Requests NONE  Final   Culture >=100,000 COLONIES/mL GRAM NEGATIVE RODS  Final   Report Status PENDING  Incomplete  Surgical pcr screen     Status: None   Collection Time: 06/21/15  4:09 AM  Result Value Ref Range Status   MRSA, PCR NEGATIVE NEGATIVE Final   Staphylococcus aureus NEGATIVE NEGATIVE Final    Comment:        The Xpert SA Assay (FDA approved for NASAL specimens in patients over 27 years of age), is one component of a comprehensive surveillance program.  Test performance has been validated by [facility] for patients greater than or equal to 84 year old. It is not intended to diagnose infection nor to guide or monitor  treatment.      Labs: Basic Metabolic Panel:  Recent Labs Lab 06/18/15 0503 06/19/15 0546 06/20/15 0336 06/21/15 0818 06/22/15 0544  NA 142 140 139 141 141  K 4.9 4.9 5.0 4.4 3.9  CL 106 104 103 104 104  CO2 21* 23 22 22 22   GLUCOSE 185* 277* 223* 170* 187*  BUN 126* 144* 153* 155* 156*  CREATININE 2.82* 3.37* 3.59* 3.47* 3.28*  CALCIUM 8.5* 8.3* 8.1* 8.4* 8.6*  PHOS  --   --   --   --  6.5*   Liver Function Tests:  Recent Labs Lab 06/17/15 1445 06/22/15 0544  AST 23  --   ALT 17  --   ALKPHOS 41  --   BILITOT 0.5  --   PROT 5.1*  --   ALBUMIN 2.3* 2.3*    Recent Labs Lab 06/17/15 1445  LIPASE 50   No results for input(s): AMMONIA in the last 168 hours. CBC:  Recent Labs Lab 06/17/15 1445 06/19/15 1244 06/20/15 0336  WBC 7.7 7.4 6.6  NEUTROABS 7.3  --  6.1  HGB 8.7* 8.3* 7.7*  HCT 28.6* 27.0* 24.9*  MCV 91.1 90.0 91.5  PLT 127* PLATELET CLUMPS NOTED ON SMEAR, COUNT APPEARS ADEQUATE 108*   Cardiac Enzymes:  Recent Labs Lab 06/17/15 1445  TROPONINI 0.06*   BNP: BNP (last 3 results)  Recent Labs  04/23/15 0008 05/27/15 0602 06/17/15 1445  BNP 298.3* 376.7* 660.3*    ProBNP (last 3 results) No results for input(s): PROBNP in the last 8760 hours.  CBG:  Recent Labs Lab 06/21/15 0540 06/21/15 1123 06/21/15 1638 06/21/15 2104 06/22/15 0622  GLUCAP 166* 217* 187* 146* 190*       Signed:  Nita Sells MD   [facility] 06/22/2015, 9:09 AM

## 2015-06-22 NOTE — Progress Notes (Signed)
Talked to patient's daughter Rip Harbour, the oxygen has arrived at the patient's home. Eliezer Lofts Soc Worker arranging ambulance transportation home. Mindi Slicker [facility] [phone]

## 2015-06-22 NOTE — Progress Notes (Signed)
Home hospice services has been arranged, awaiting for home oxygen to be delivered to the home prior to discharging from the hospital. TCT Debbie with [facility], she is still working on arranging this. After this is completed, patient will be discharged home via ambulance. Mindi Slicker [facility] [phone]

## 2015-06-22 NOTE — Progress Notes (Signed)
I see plans for patient to not pursue dialysis and to go home with hospice.  I think this is the right decision for her.  Renal will sigh off- call if any concerns   Lisette Mancebo A

## 2015-06-24 ENCOUNTER — Telehealth: Payer: Self-pay | Admitting: Nurse Practitioner

## 2015-06-30 DEATH — deceased

## 2017-06-08 IMAGING — DX DG CHEST 2V
2 series · 2 of 2 positions shown · non-contrast
Comparison: 04/23/2015

CLINICAL DATA: Shortness of breath today, slight weakness.  Cough.

EXAM:
CHEST  2 VIEW

[w chest lat]
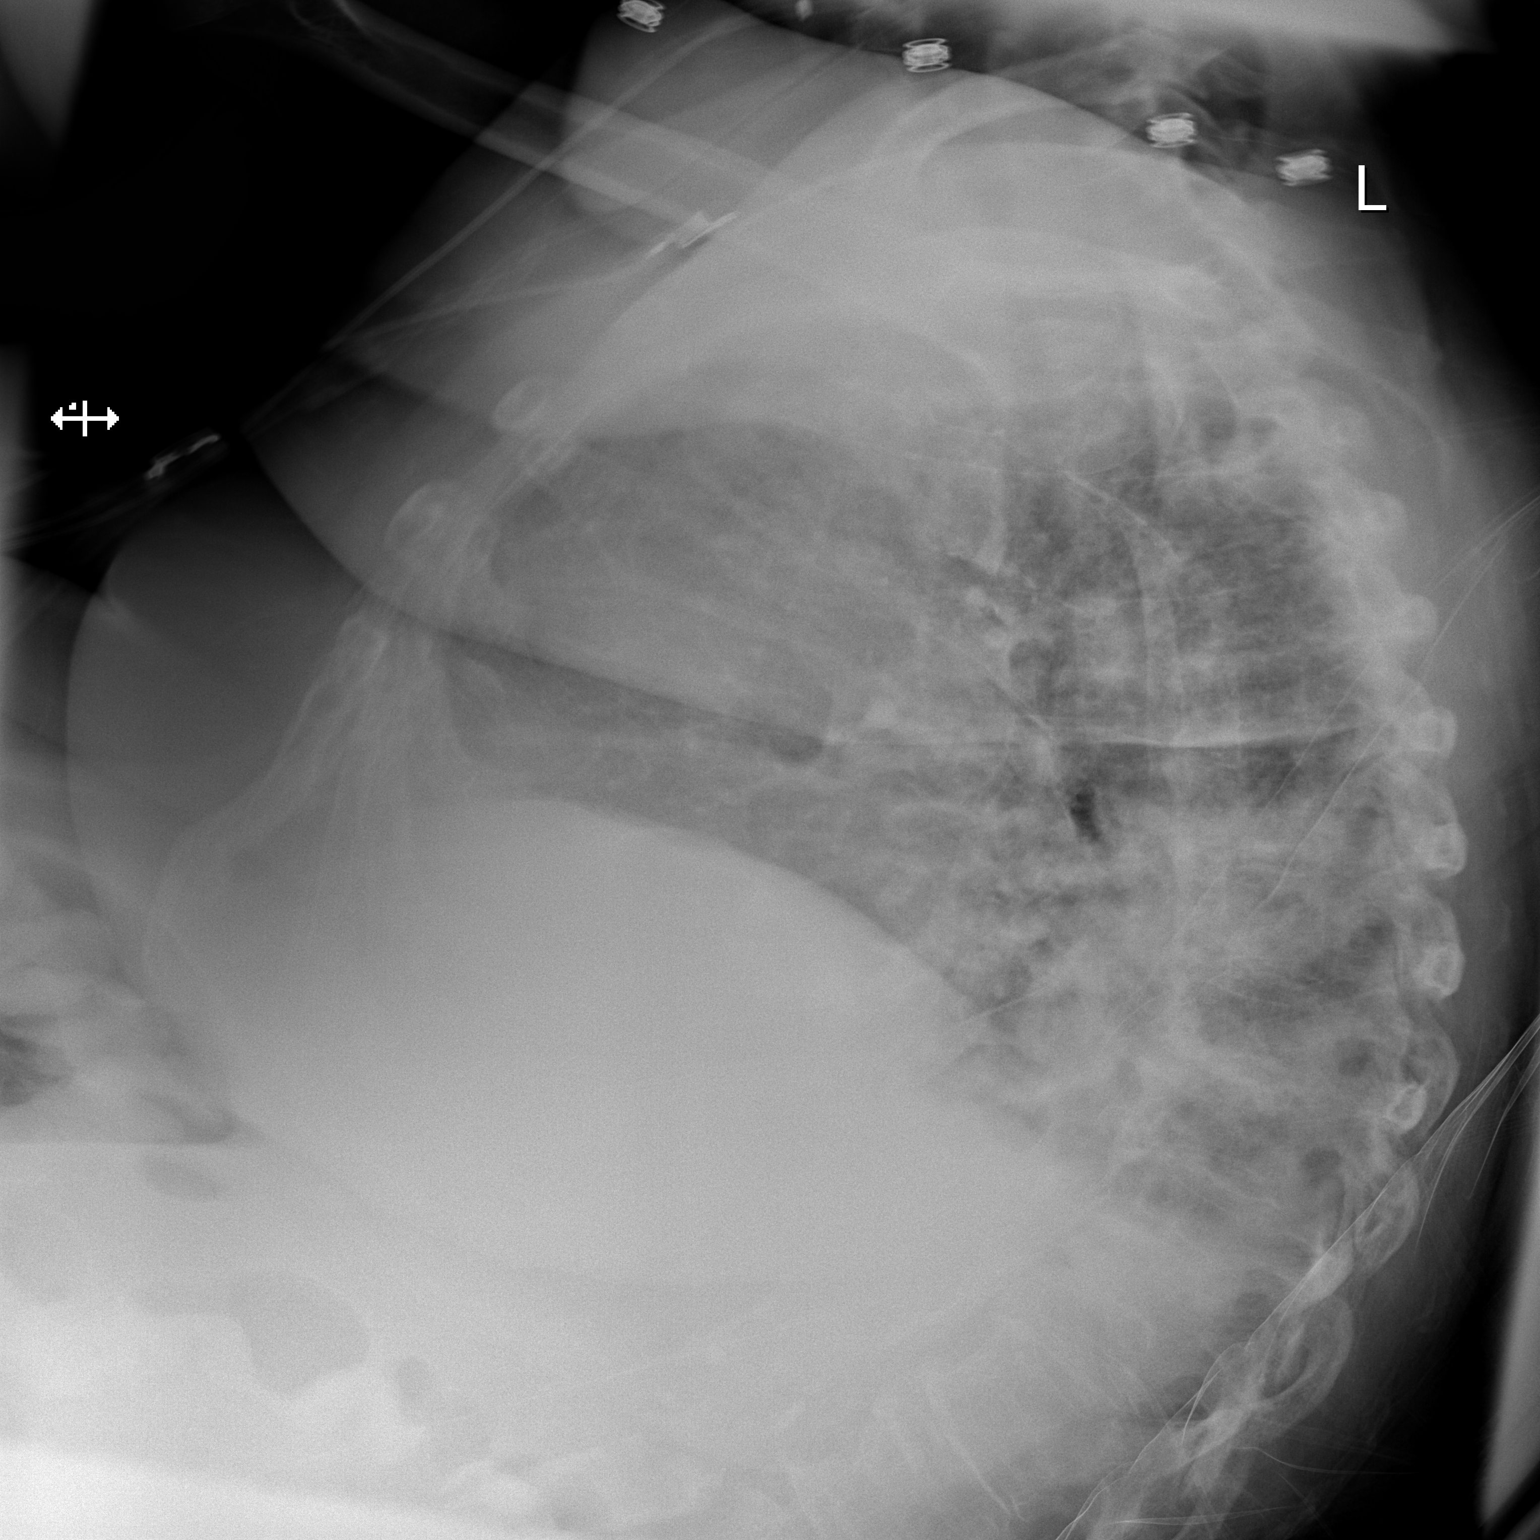

[x chest ap]
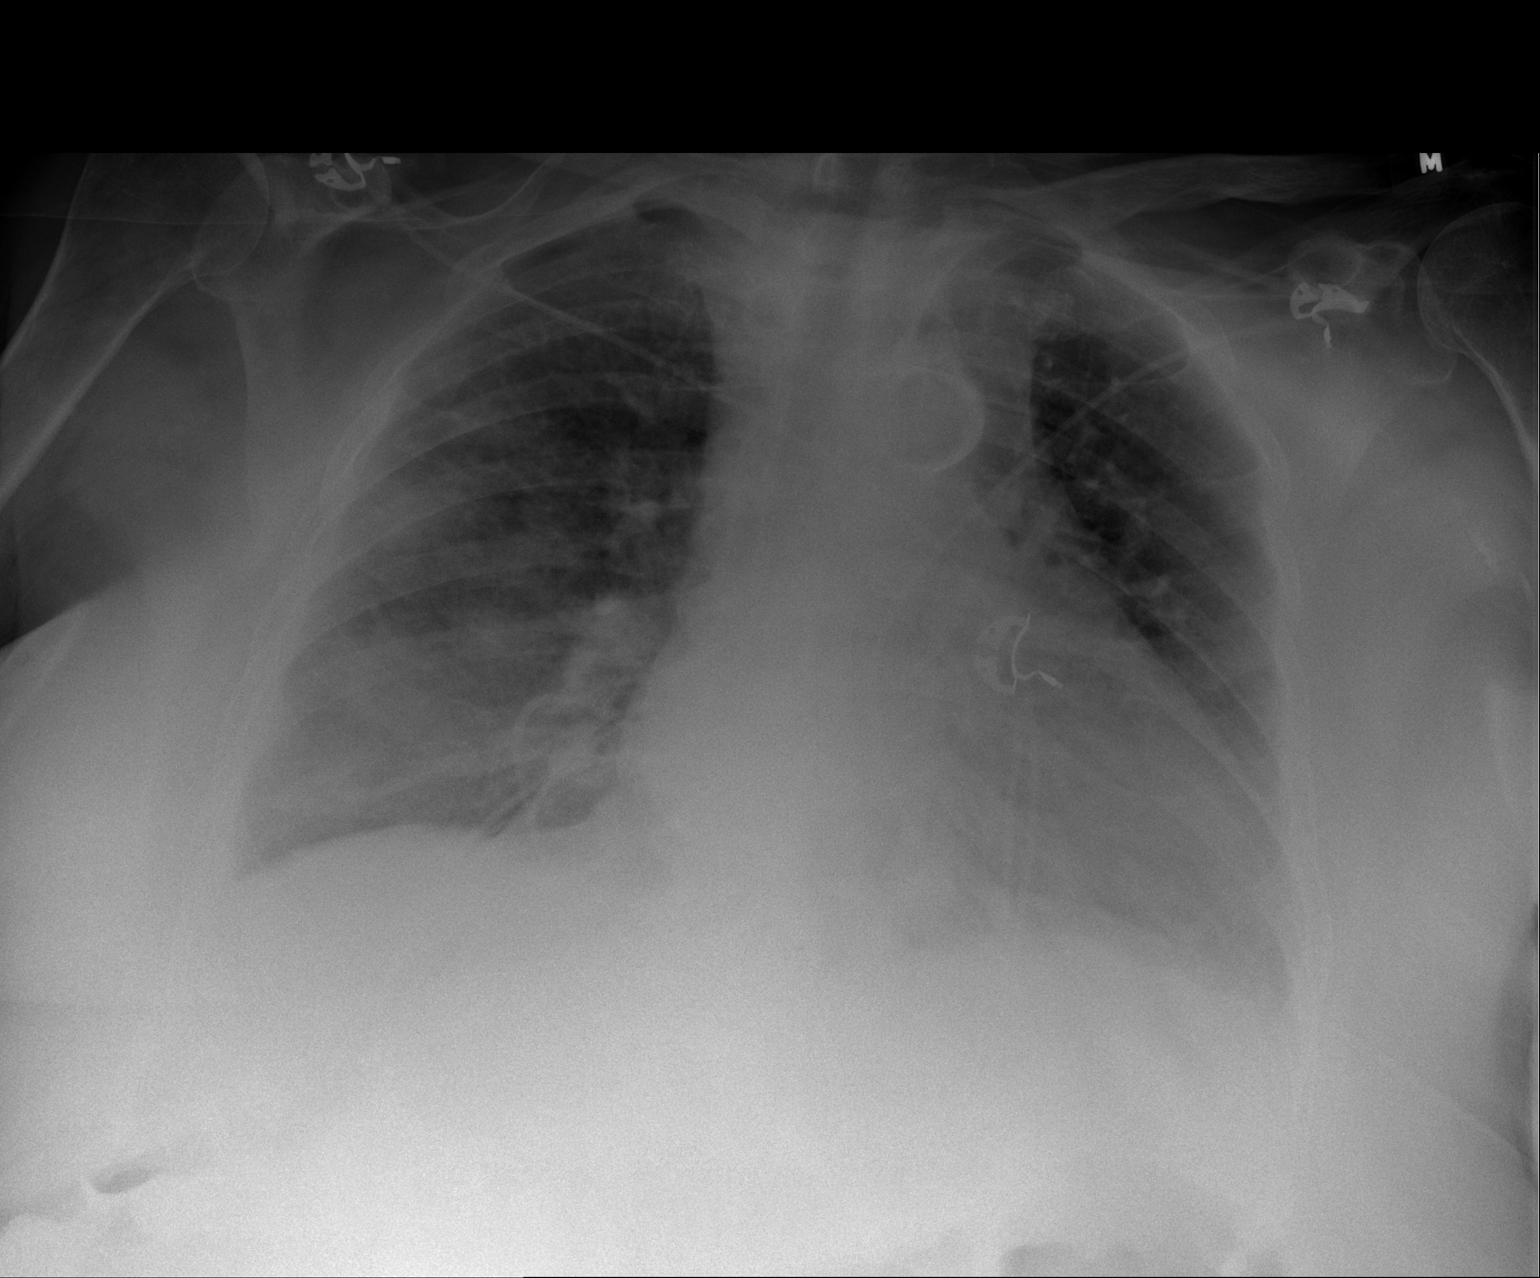

[2 of 2 positions shown; findings below may reference images not displayed]

FINDINGS: Airspace opacity noted posteriorly on the lateral view concerning
for pneumonia, likely within the left lower lobe. No confluent
opacity on the right. Cardiomegaly with vascular congestion. No
visible effusions or acute bony abnormality.
IMPRESSION: Airspace opacity posteriorly on the lateral view, likely left lower
lobe pneumonia.

Cardiomegaly, vascular congestion.

## 2017-07-16 IMAGING — DX DG CHEST 2V
2 series · 2 of 2 positions shown · non-contrast
Comparison: 05/26/2015

CLINICAL DATA: Shortness of Breath

EXAM:
CHEST  2 VIEW

[chest pa]
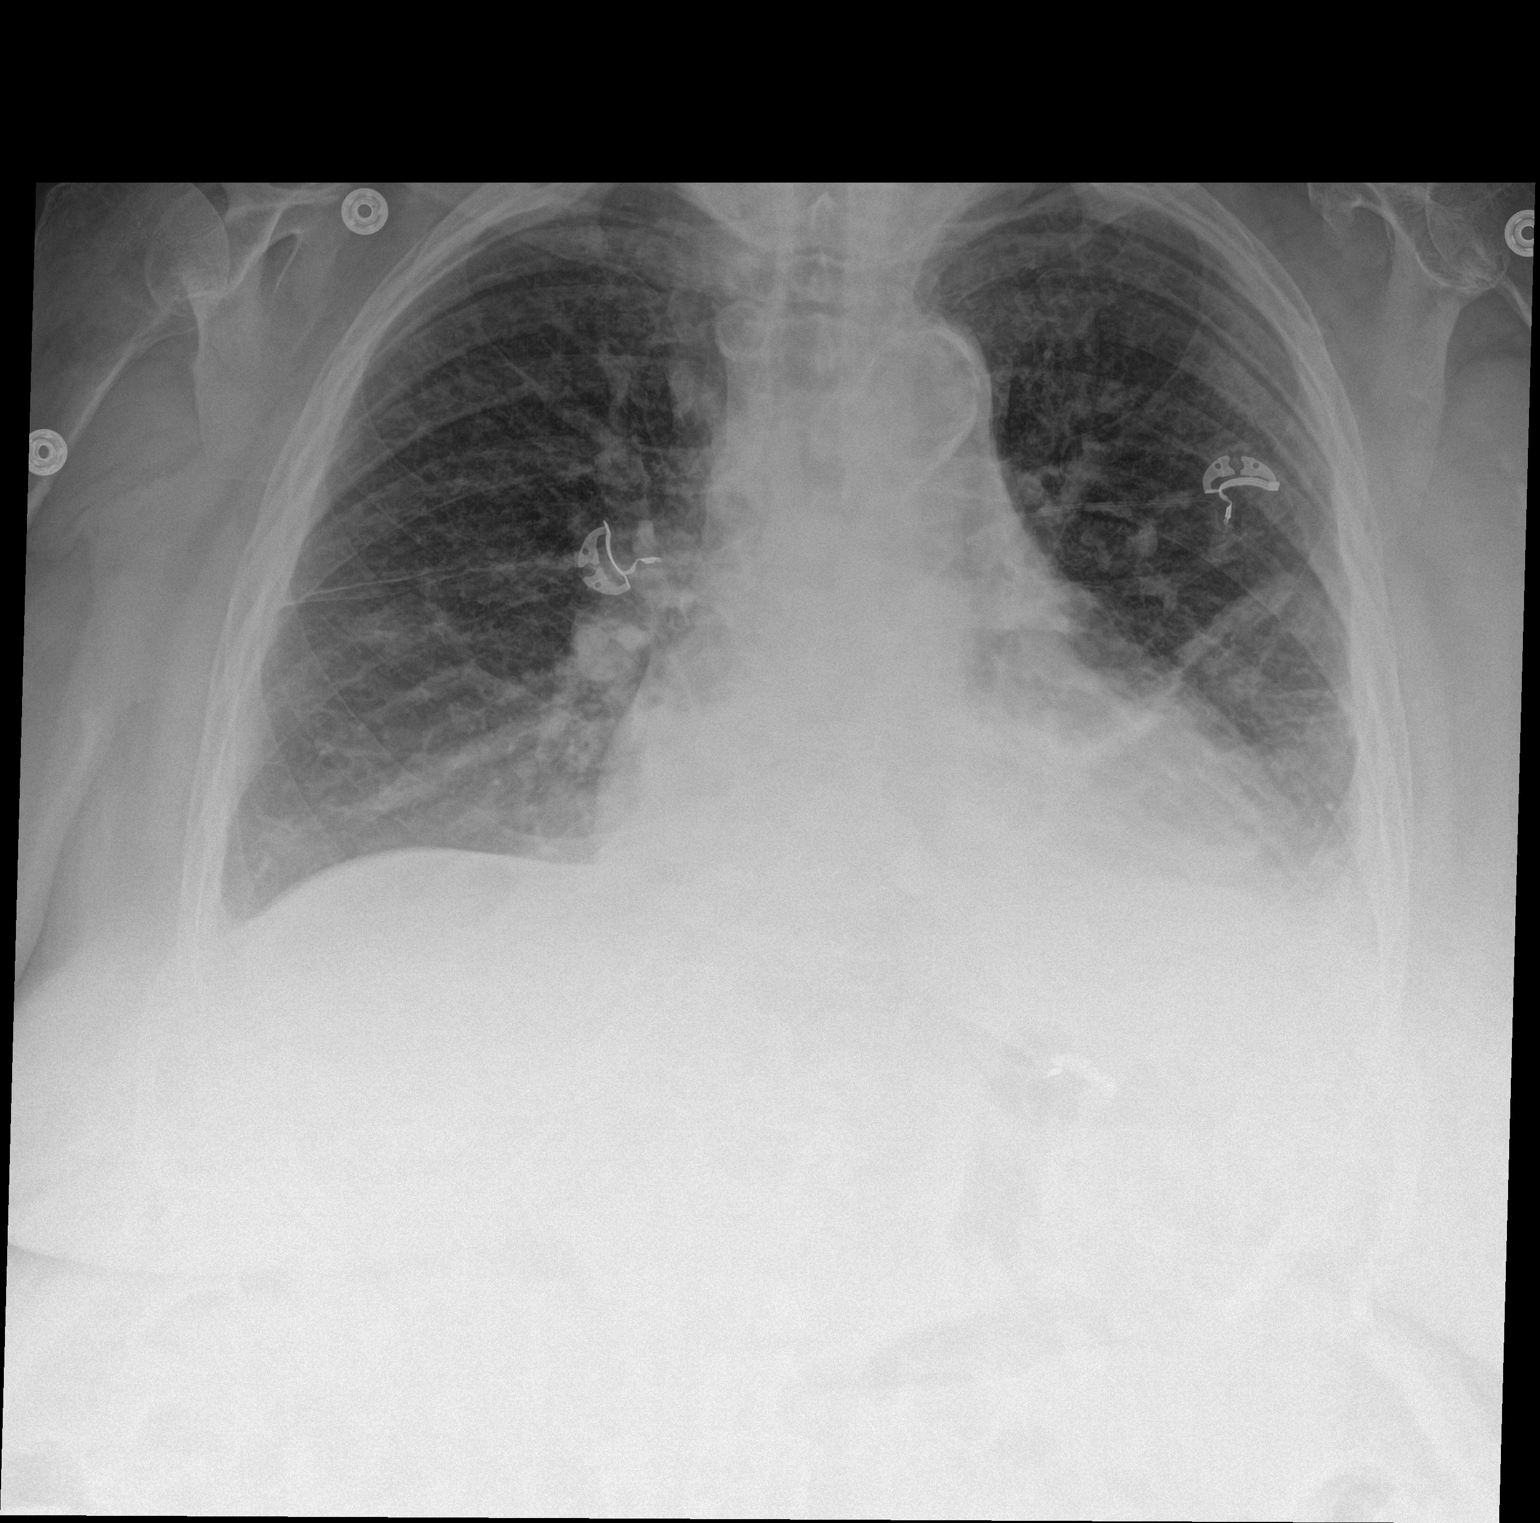

[chest lat]
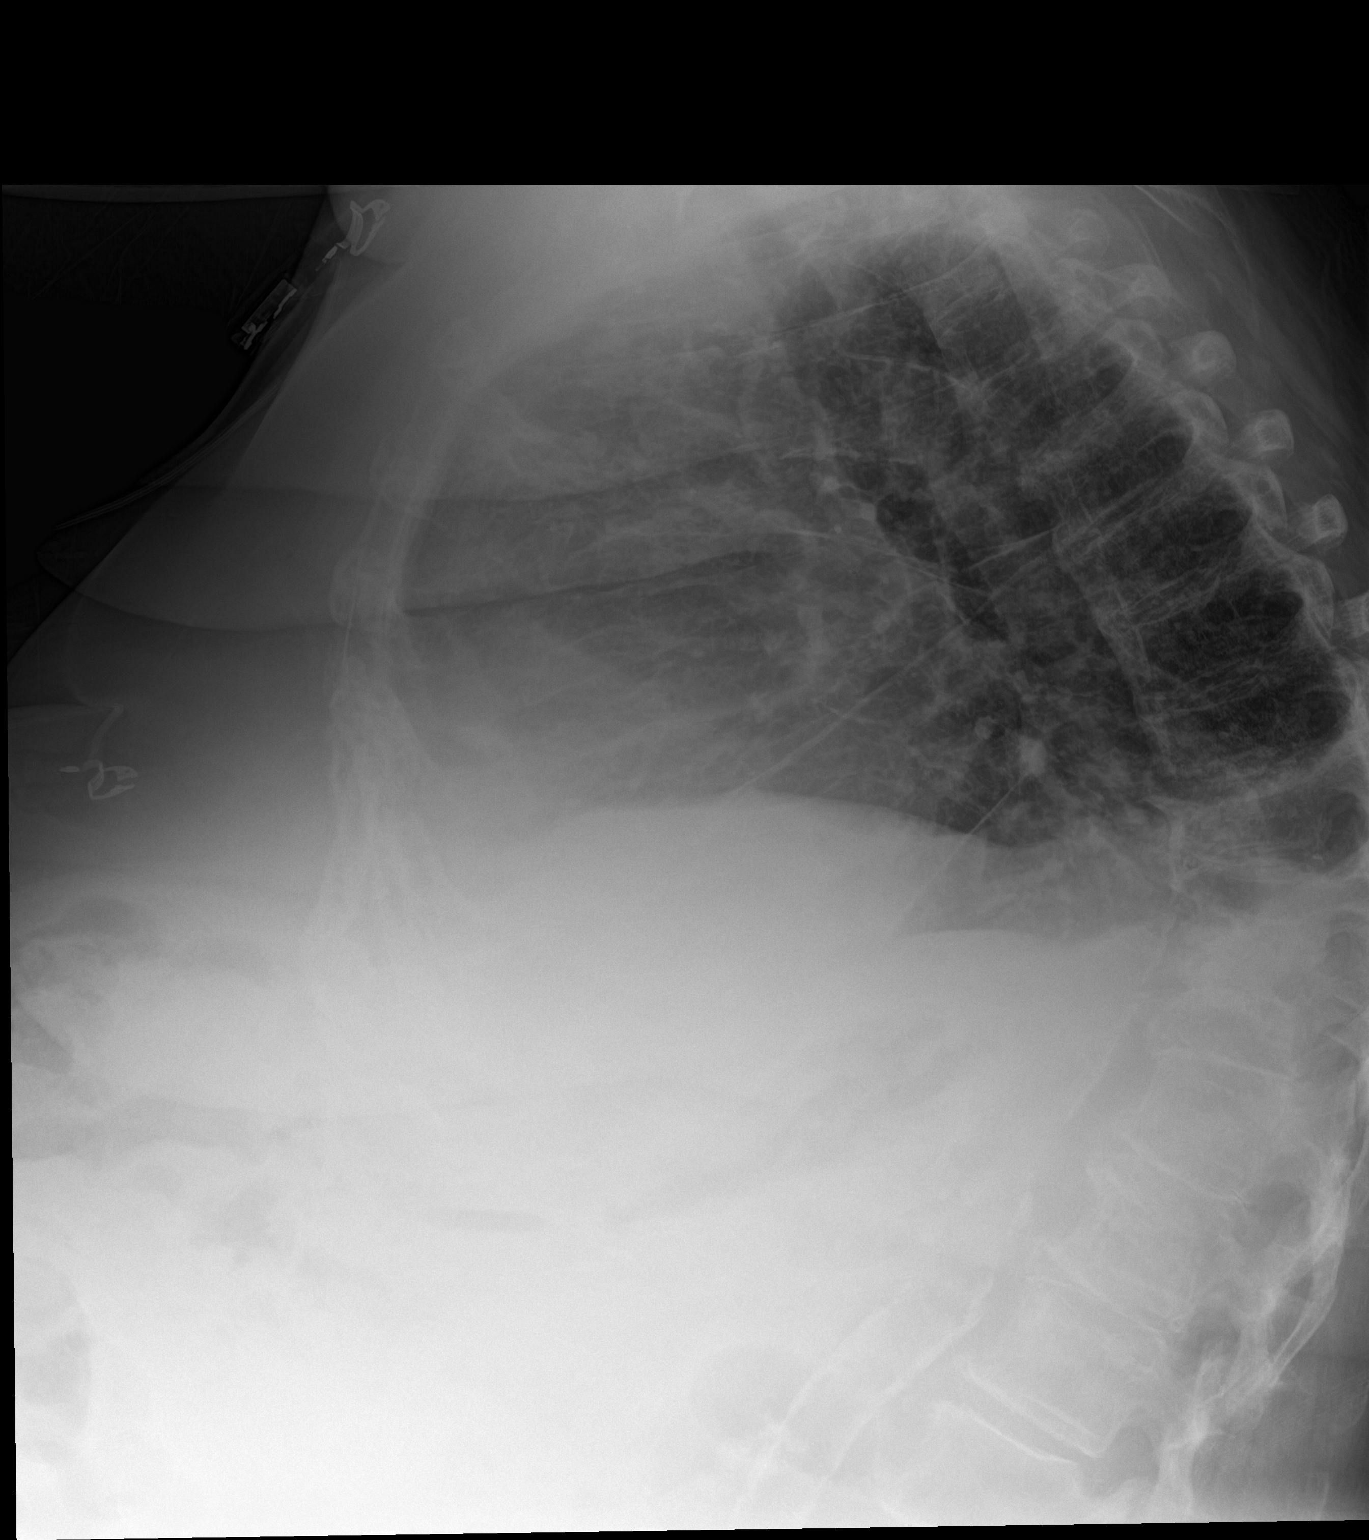

[2 of 2 positions shown; findings below may reference images not displayed]

FINDINGS: Cardiomediastinal silhouette is stable. Persistent left small
pleural effusion with left basilar atelectasis or infiltrate. New
linear atelectasis or early infiltrate in lingula. Right lung is
clear. No pulmonary edema.
IMPRESSION: Persistent left small pleural effusion with left basilar atelectasis
or infiltrate. New linear atelectasis or early infiltrate in
lingula. Right lung is clear.

## 2017-07-19 IMAGING — US US BIOPSY
1 series · 9 of 9 positions shown · non-contrast
Comparison: none

CLINICAL DATA: Acute on chronic renal failure. The patient requires
renal biopsy.

[Series 1: us biopsy · 0.23mm/px · 9 of 9 slices shown]
[im 1/9]
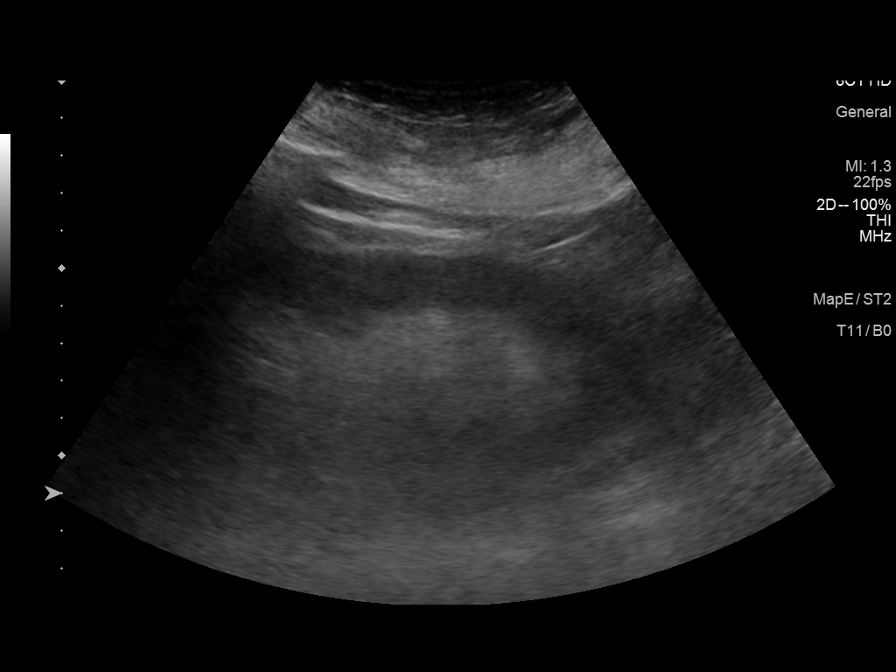
[im 2/9]
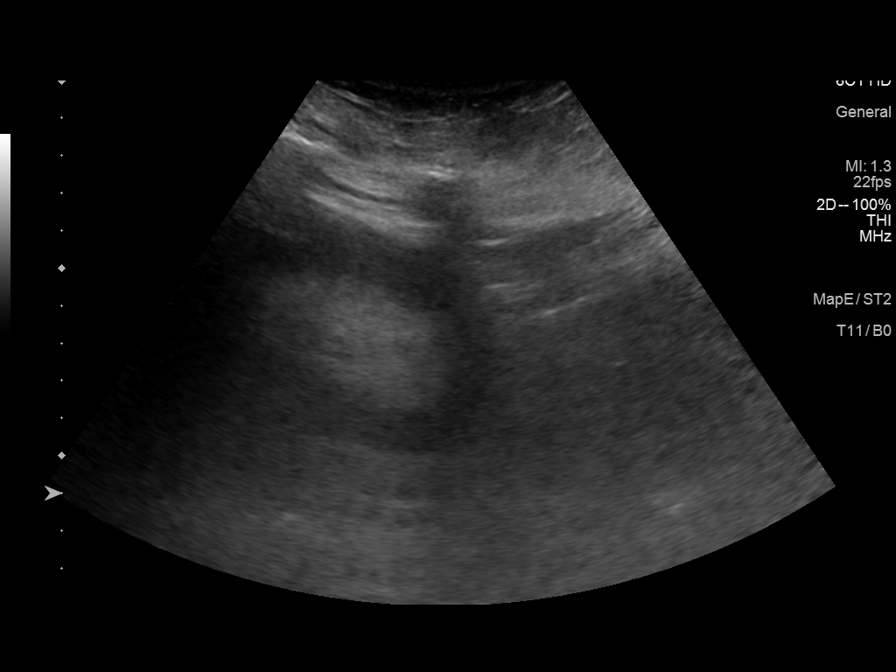
[im 3/9]
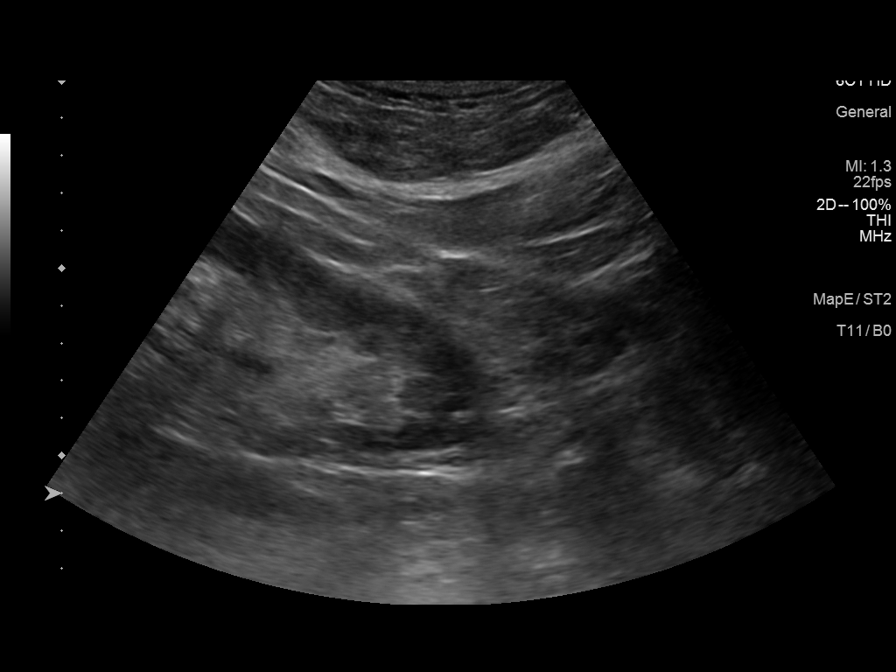
[im 4/9]
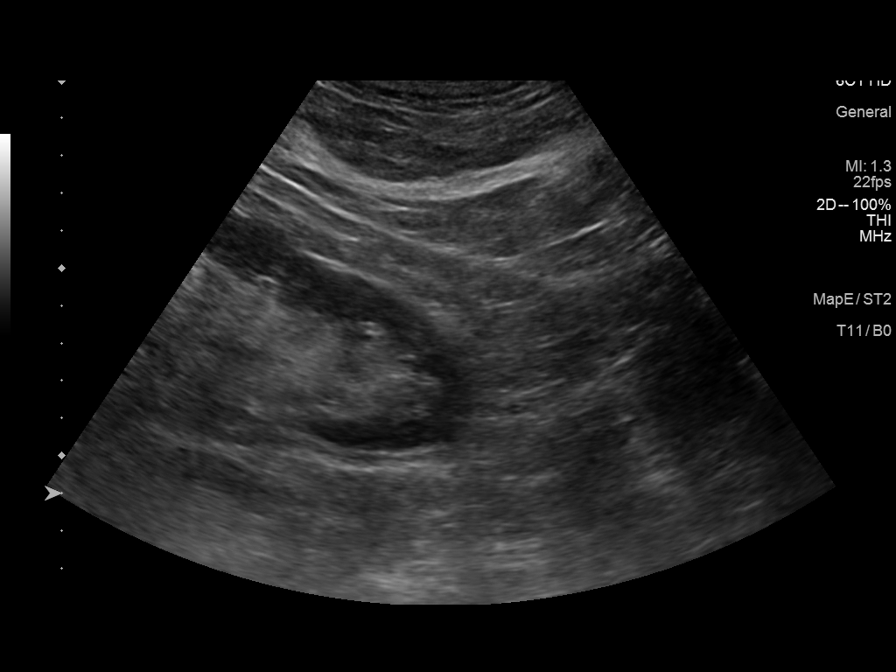
[im 5/9]
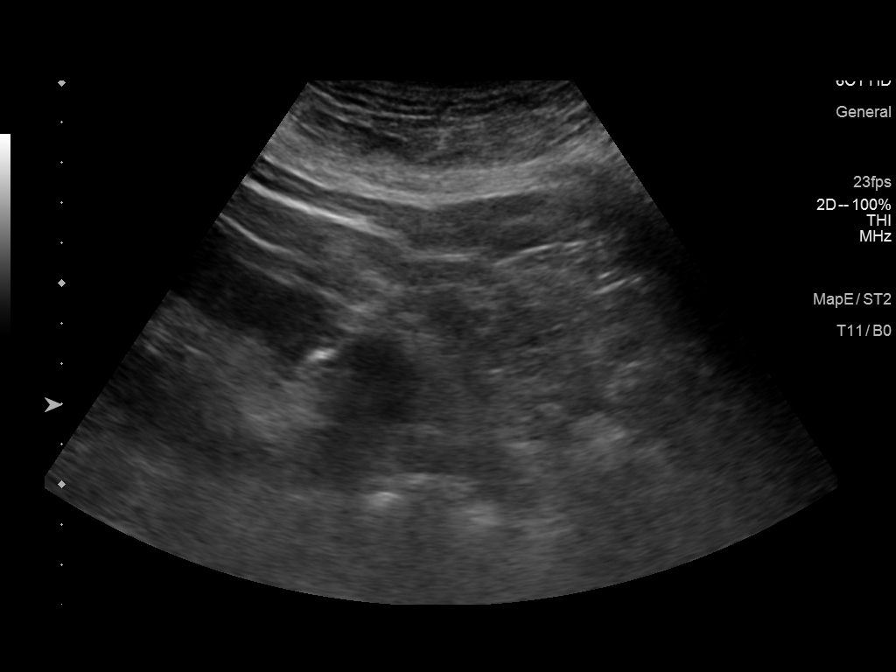
[im 6/9]
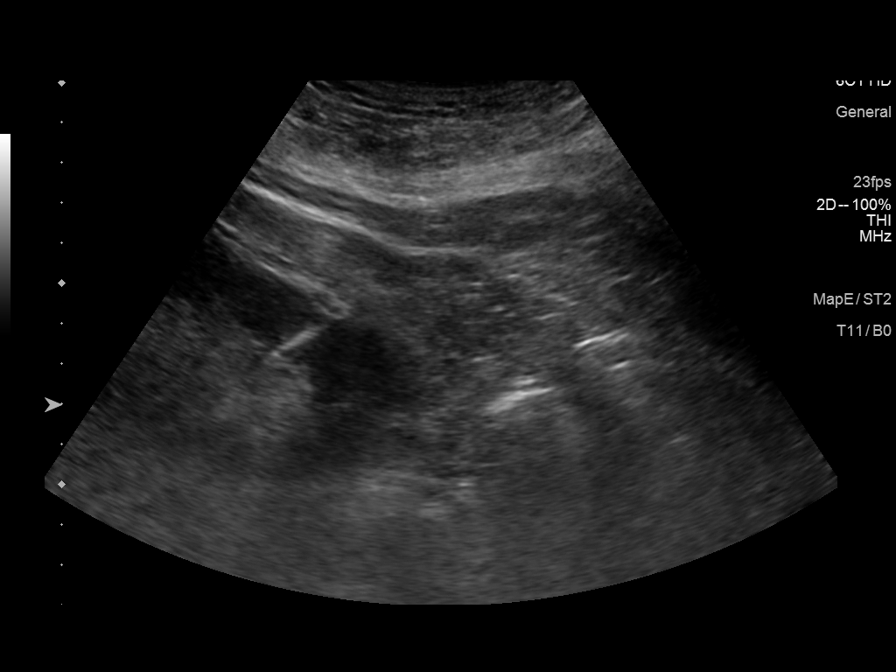
[im 7/9]
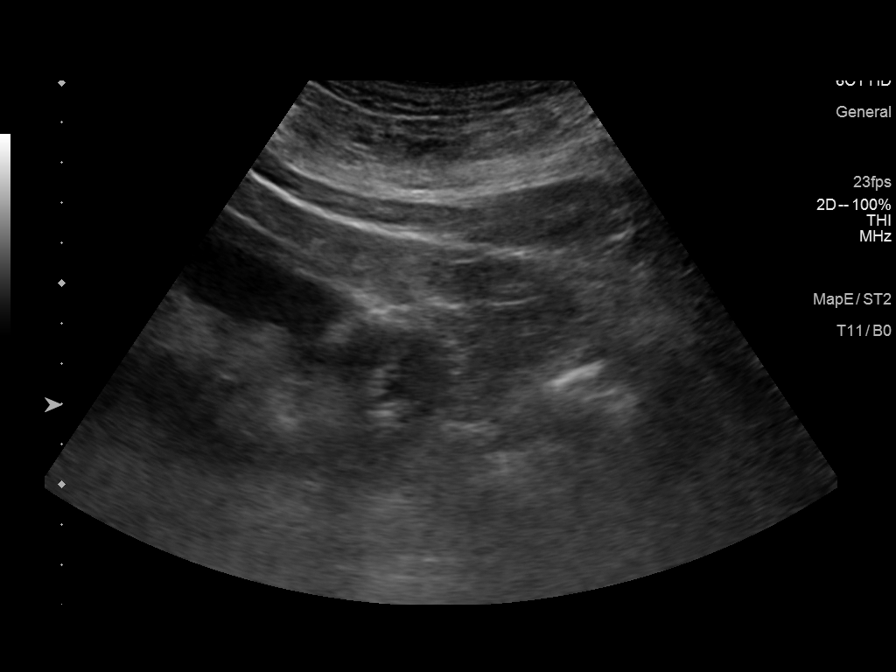
[im 8/9]
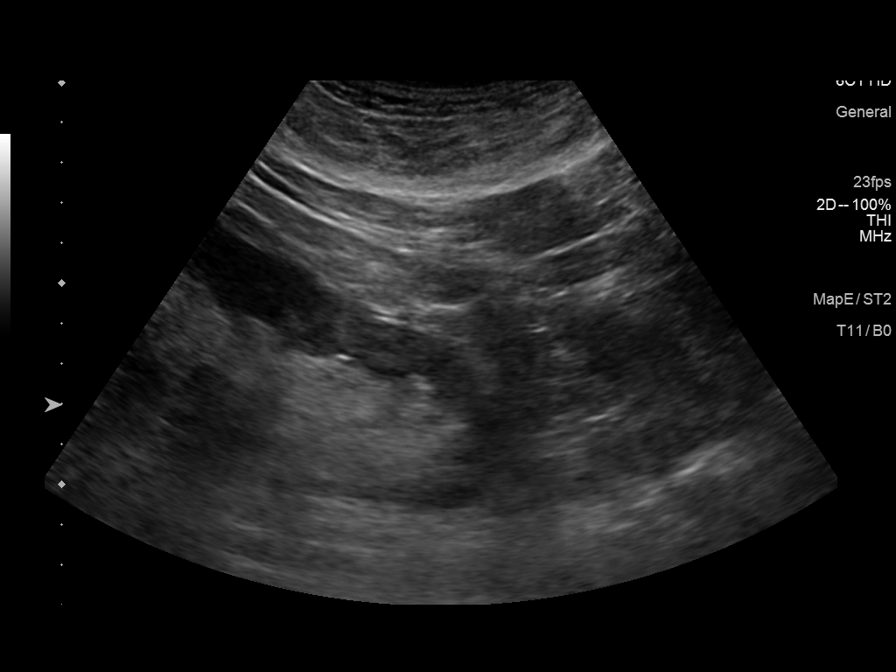
[im 9/9]
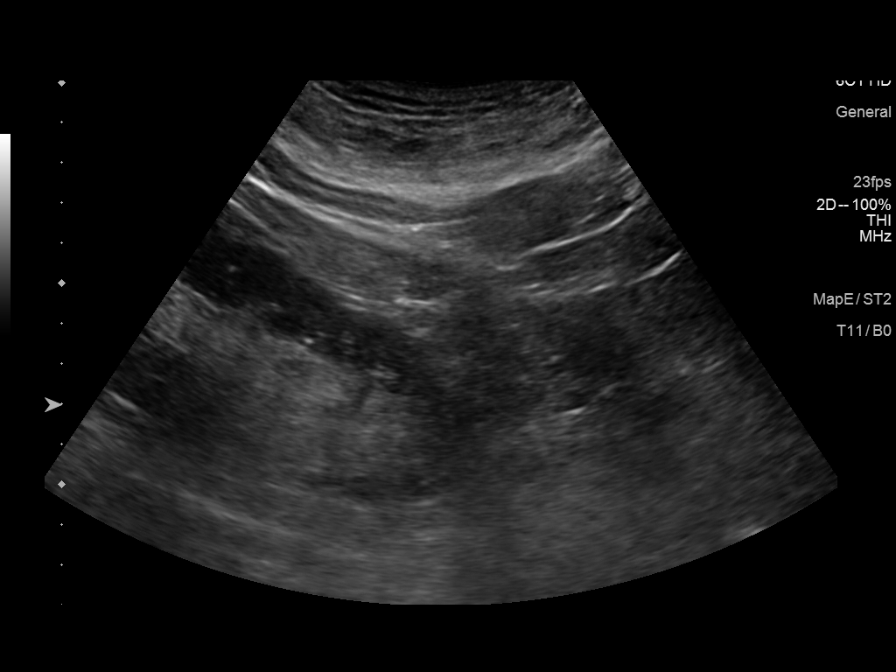

[9 of 9 positions shown; findings below may reference images not displayed]

EXAM:
ULTRASOUND GUIDED CORE BIOPSY OF LEFT KIDNEY

MEDICATIONS:
1.0 mg IV Versed; 25 mcg IV Fentanyl

Total Moderate Sedation Time: 10 minutes.

The patient's level of consciousness and physiologic status were
continuously monitored during the procedure by Radiology nursing.

PROCEDURE:
The procedure, risks, benefits, and alternatives were explained to
the patient. Questions regarding the procedure were encouraged and
answered. The patient understands and consents to the procedure. A
time-out was performed prior to the procedure.

The left flank region was prepped with chlorhexidine in a sterile
fashion, and a sterile drape was applied covering the operative
field. A sterile gown and sterile gloves were used for the
procedure. Local anesthesia was provided with 1% Lidocaine.

Ultrasound was used to localize the left kidney. Under direct
ultrasound guidance, a 16 gauge core biopsy device was advanced.
Three separate core biopsy samples were obtained of lower pole
cortex. Core samples were submitted in saline.

COMPLICATIONS:
None.
FINDINGS: Preliminary imaging shows much better visualization of the left
kidney compared to the right from a posterior approach. Solid tissue
was obtained with biopsy.
IMPRESSION: Ultrasound-guided core biopsy performed of the left kidney at the
level of lower pole cortex.

## 2017-12-21 IMAGING — US US RENAL
1 series · 14 of 18 positions shown · non-contrast
Comparison: CT abdomen and pelvis 04/20/2015

CLINICAL DATA: Acute kidney injury. Bilateral renal atrophy seen on
previous CT and ultrasound examinations.

EXAM:
RENAL / URINARY TRACT ULTRASOUND COMPLETE

[Series 1: us renal · 0.28mm/px · 14 of 18 slices shown]
[im 1/18]
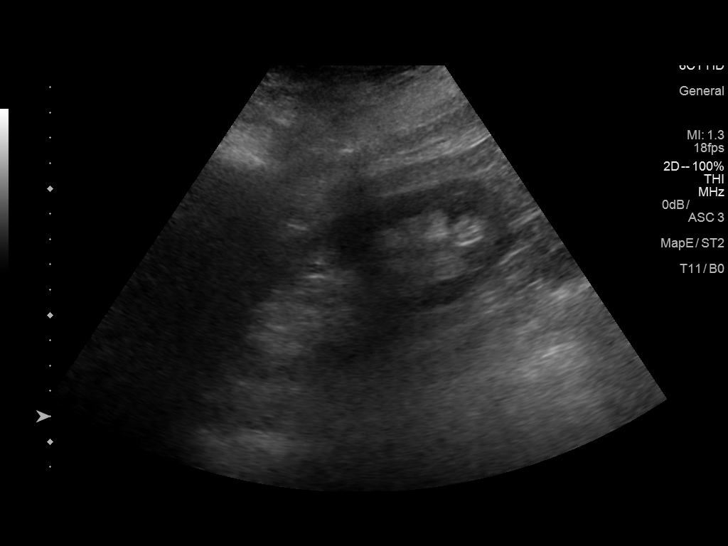
[im 2/18]
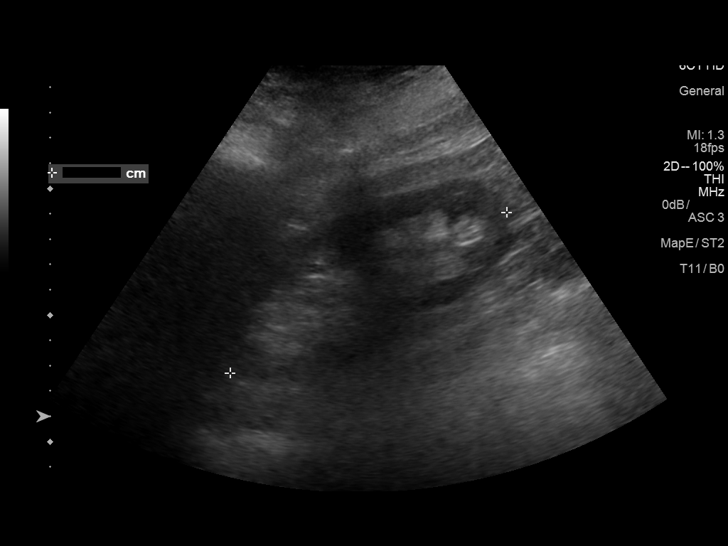
[im 4/18]
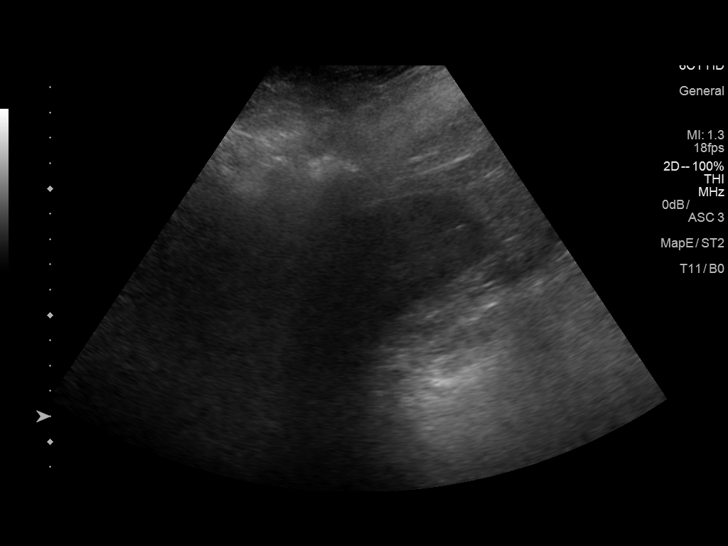
[im 5/18]
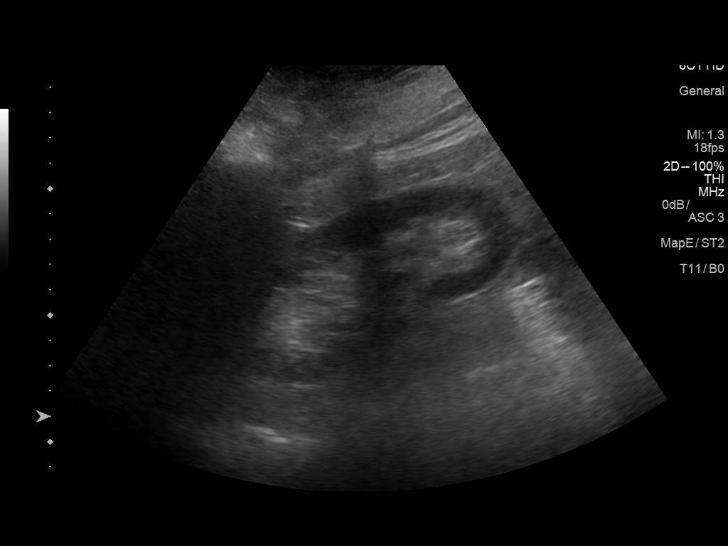
[im 6/18]
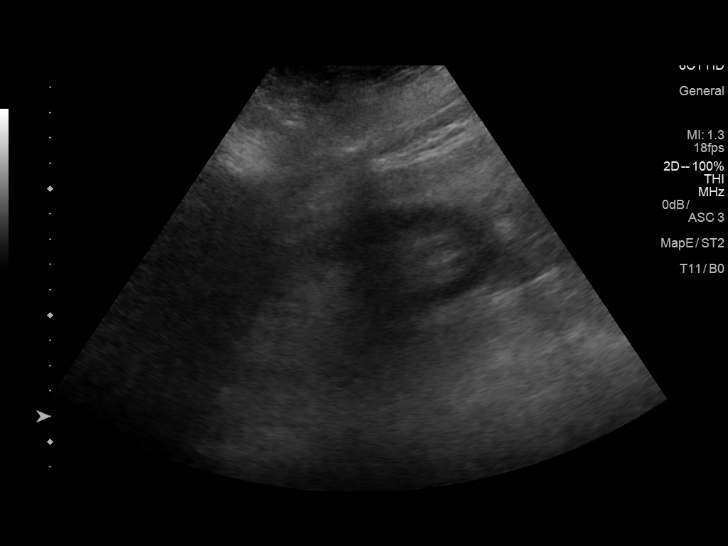
[im 8/18]
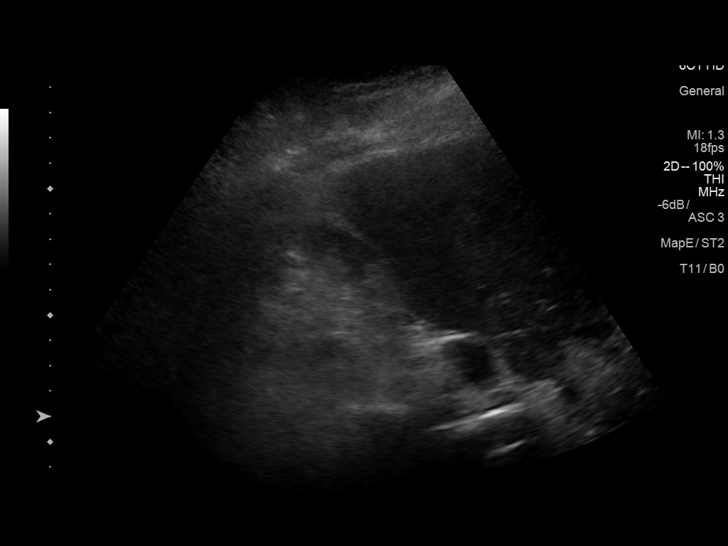
[im 9/18]
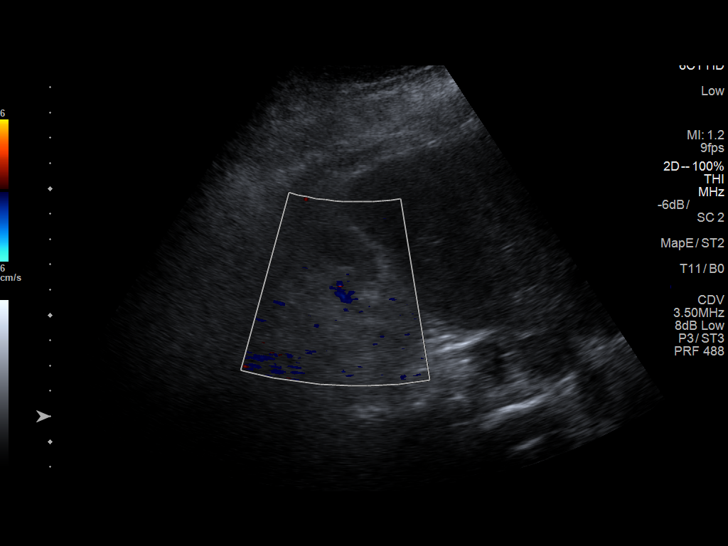
[im 10/18]
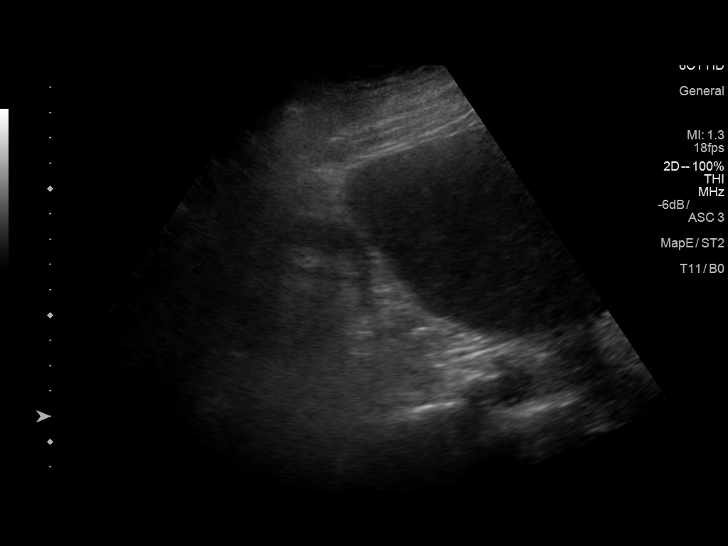
[im 11/18]
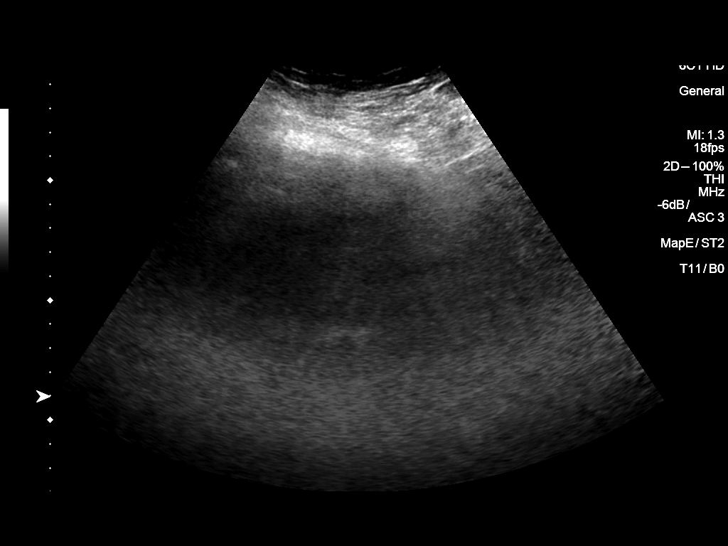
[im 13/18]
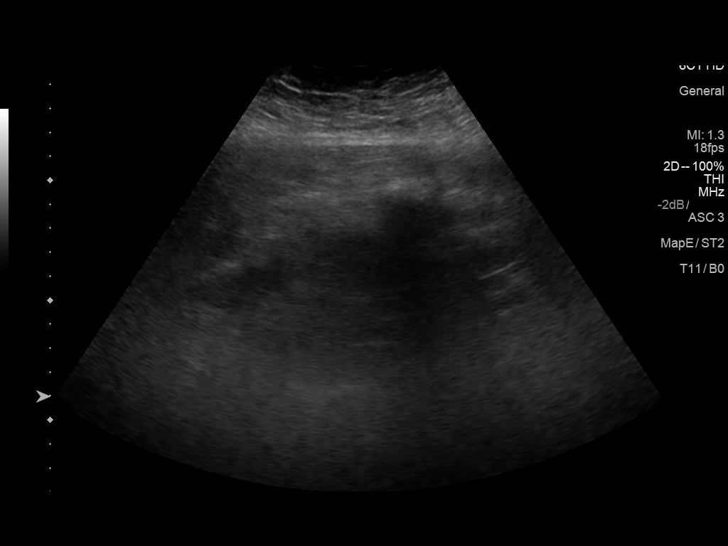
[im 14/18]
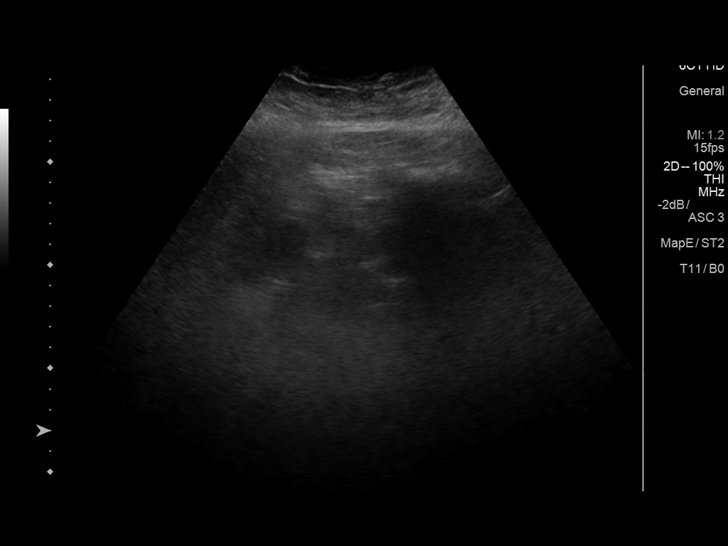
[im 15/18]
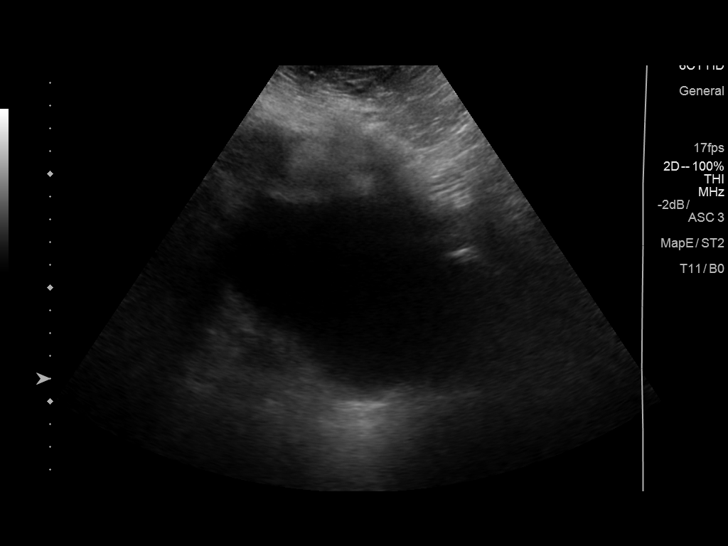
[im 17/18]
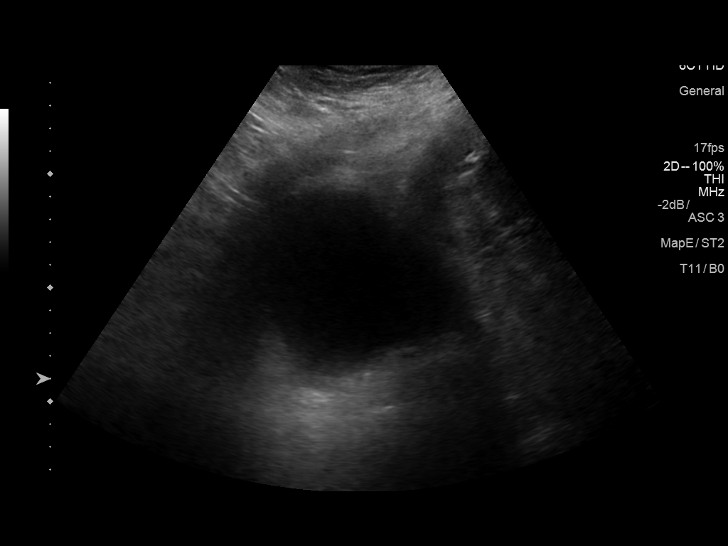
[im 18/18]
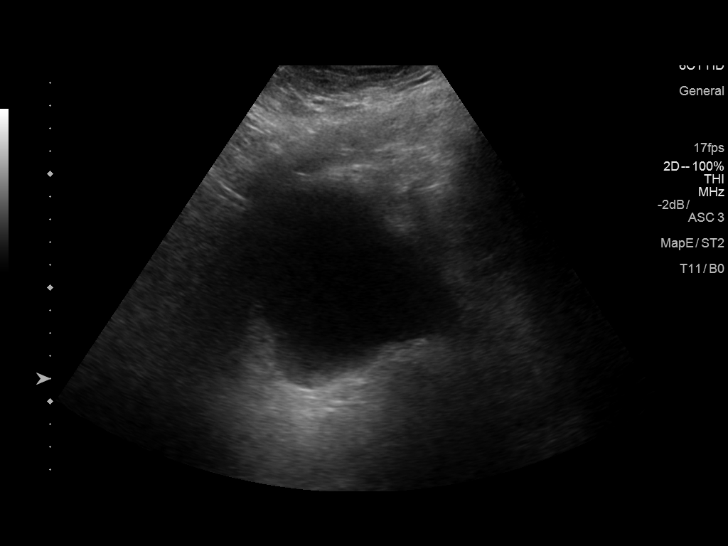

[14 of 18 positions shown; findings below may reference images not displayed]

FINDINGS: Right Kidney:

Length: 12.6 cm. Mild diffuse parenchymal thinning. No solid mass or
hydronephrosis.

Left Kidney:

Left kidney is not visualized due to overlying bowel gas. Unable to
reposition patient for better visualization.

Bladder:

No bladder wall thickening or intraluminal filling defects.
IMPRESSION: Mild diffuse parenchymal atrophy in the right kidney. Left kidney is
not visualized due to bowel gas.
